# Patient Record
Sex: Male | Born: 1945 | Race: Black or African American | Hispanic: No | Marital: Married | Smoking: Former smoker
Health system: Southern US, Community
[De-identification: ages and names within clinical notes are randomized; demographics above are authoritative.]

## PROBLEM LIST (undated history)

## (undated) ENCOUNTER — Ambulatory Visit: Admission: EM | Source: Home / Self Care

## (undated) DIAGNOSIS — I779 Disorder of arteries and arterioles, unspecified: Secondary | ICD-10-CM

## (undated) DIAGNOSIS — I739 Peripheral vascular disease, unspecified: Secondary | ICD-10-CM

## (undated) DIAGNOSIS — Z8249 Family history of ischemic heart disease and other diseases of the circulatory system: Secondary | ICD-10-CM

## (undated) DIAGNOSIS — C801 Malignant (primary) neoplasm, unspecified: Secondary | ICD-10-CM

## (undated) DIAGNOSIS — E785 Hyperlipidemia, unspecified: Secondary | ICD-10-CM

## (undated) DIAGNOSIS — I82890 Acute embolism and thrombosis of other specified veins: Secondary | ICD-10-CM

## (undated) DIAGNOSIS — I10 Essential (primary) hypertension: Secondary | ICD-10-CM

## (undated) DIAGNOSIS — K219 Gastro-esophageal reflux disease without esophagitis: Secondary | ICD-10-CM

## (undated) DIAGNOSIS — Z9289 Personal history of other medical treatment: Secondary | ICD-10-CM

## (undated) HISTORY — DX: Peripheral vascular disease, unspecified: I73.9

## (undated) HISTORY — DX: Disorder of arteries and arterioles, unspecified: I77.9

## (undated) HISTORY — PX: OTHER SURGICAL HISTORY: SHX169

## (undated) HISTORY — DX: Essential (primary) hypertension: I10

## (undated) HISTORY — DX: Family history of ischemic heart disease and other diseases of the circulatory system: Z82.49

## (undated) HISTORY — PX: ANKLE SURGERY: SHX546

## (undated) HISTORY — DX: Hyperlipidemia, unspecified: E78.5

---

## 1997-03-03 DIAGNOSIS — I82890 Acute embolism and thrombosis of other specified veins: Secondary | ICD-10-CM

## 1997-03-03 HISTORY — DX: Acute embolism and thrombosis of other specified veins: I82.890

## 1997-08-02 ENCOUNTER — Ambulatory Visit (HOSPITAL_COMMUNITY): Admission: RE | Admit: 1997-08-02 | Discharge: 1997-08-02 | Payer: Self-pay | Admitting: Gastroenterology

## 1999-03-26 ENCOUNTER — Emergency Department (HOSPITAL_COMMUNITY): Admission: EM | Admit: 1999-03-26 | Discharge: 1999-03-26 | Payer: Self-pay | Admitting: Internal Medicine

## 2000-01-08 ENCOUNTER — Encounter: Payer: Self-pay | Admitting: Emergency Medicine

## 2000-01-08 ENCOUNTER — Emergency Department (HOSPITAL_COMMUNITY): Admission: EM | Admit: 2000-01-08 | Discharge: 2000-01-08 | Payer: Self-pay | Admitting: Emergency Medicine

## 2001-01-03 ENCOUNTER — Encounter: Payer: Self-pay | Admitting: Emergency Medicine

## 2001-01-03 ENCOUNTER — Emergency Department (HOSPITAL_COMMUNITY): Admission: EM | Admit: 2001-01-03 | Discharge: 2001-01-03 | Payer: Self-pay | Admitting: Emergency Medicine

## 2004-11-04 ENCOUNTER — Emergency Department (HOSPITAL_COMMUNITY): Admission: EM | Admit: 2004-11-04 | Discharge: 2004-11-04 | Payer: Self-pay | Admitting: Family Medicine

## 2004-11-25 ENCOUNTER — Encounter: Admission: RE | Admit: 2004-11-25 | Discharge: 2004-11-25 | Payer: Self-pay | Admitting: Family Medicine

## 2007-07-12 ENCOUNTER — Emergency Department (HOSPITAL_COMMUNITY): Admission: EM | Admit: 2007-07-12 | Discharge: 2007-07-12 | Payer: Self-pay | Admitting: Emergency Medicine

## 2007-07-20 HISTORY — PX: US ECHOCARDIOGRAPHY: HXRAD669

## 2007-07-21 ENCOUNTER — Ambulatory Visit: Payer: Self-pay | Admitting: Vascular Surgery

## 2007-07-26 ENCOUNTER — Emergency Department (HOSPITAL_COMMUNITY): Admission: EM | Admit: 2007-07-26 | Discharge: 2007-07-26 | Payer: Self-pay | Admitting: Family Medicine

## 2007-10-08 ENCOUNTER — Ambulatory Visit: Payer: Self-pay | Admitting: Vascular Surgery

## 2008-05-29 ENCOUNTER — Encounter: Admission: RE | Admit: 2008-05-29 | Discharge: 2008-05-29 | Payer: Self-pay | Admitting: Family Medicine

## 2008-05-30 ENCOUNTER — Emergency Department (HOSPITAL_COMMUNITY): Admission: EM | Admit: 2008-05-30 | Discharge: 2008-05-30 | Payer: Self-pay | Admitting: Emergency Medicine

## 2008-06-02 ENCOUNTER — Ambulatory Visit (HOSPITAL_BASED_OUTPATIENT_CLINIC_OR_DEPARTMENT_OTHER): Admission: RE | Admit: 2008-06-02 | Discharge: 2008-06-02 | Payer: Self-pay | Admitting: Internal Medicine

## 2008-06-02 ENCOUNTER — Ambulatory Visit: Payer: Self-pay | Admitting: Diagnostic Radiology

## 2008-06-27 ENCOUNTER — Encounter: Payer: Self-pay | Admitting: Internal Medicine

## 2008-07-04 ENCOUNTER — Inpatient Hospital Stay (HOSPITAL_COMMUNITY): Admission: EM | Admit: 2008-07-04 | Discharge: 2008-07-07 | Payer: Self-pay | Admitting: Emergency Medicine

## 2008-07-14 ENCOUNTER — Emergency Department (HOSPITAL_COMMUNITY): Admission: EM | Admit: 2008-07-14 | Discharge: 2008-07-14 | Payer: Self-pay | Admitting: Emergency Medicine

## 2008-07-20 ENCOUNTER — Emergency Department (HOSPITAL_COMMUNITY): Admission: EM | Admit: 2008-07-20 | Discharge: 2008-07-21 | Payer: Self-pay | Admitting: Emergency Medicine

## 2008-07-27 ENCOUNTER — Inpatient Hospital Stay (HOSPITAL_COMMUNITY): Admission: EM | Admit: 2008-07-27 | Discharge: 2008-07-28 | Payer: Self-pay | Admitting: Emergency Medicine

## 2009-05-10 ENCOUNTER — Inpatient Hospital Stay (HOSPITAL_COMMUNITY): Admission: EM | Admit: 2009-05-10 | Discharge: 2009-05-11 | Payer: Self-pay | Admitting: Emergency Medicine

## 2010-04-22 DIAGNOSIS — R252 Cramp and spasm: Secondary | ICD-10-CM | POA: Insufficient documentation

## 2010-04-22 DIAGNOSIS — J309 Allergic rhinitis, unspecified: Secondary | ICD-10-CM | POA: Insufficient documentation

## 2010-04-22 DIAGNOSIS — I2510 Atherosclerotic heart disease of native coronary artery without angina pectoris: Secondary | ICD-10-CM | POA: Insufficient documentation

## 2010-05-27 LAB — BASIC METABOLIC PANEL
BUN: 11 mg/dL (ref 6–23)
BUN: 14 mg/dL (ref 6–23)
CO2: 24 mEq/L (ref 19–32)
CO2: 29 mEq/L (ref 19–32)
Calcium: 8.5 mg/dL (ref 8.4–10.5)
Calcium: 8.8 mg/dL (ref 8.4–10.5)
Chloride: 106 mEq/L (ref 96–112)
Chloride: 111 mEq/L (ref 96–112)
Creatinine, Ser: 1 mg/dL (ref 0.4–1.5)
Creatinine, Ser: 1.16 mg/dL (ref 0.4–1.5)
GFR calc Af Amer: >60 mL/min (ref >60)
GFR calc Af Amer: >60 mL/min (ref >60)
GFR calc non Af Amer: >60 mL/min (ref >60)
GFR calc non Af Amer: >60 mL/min (ref >60)
Glucose, Bld: 100 mg/dL — ABNORMAL HIGH (ref 70–99)
Glucose, Bld: 102 mg/dL — ABNORMAL HIGH (ref 70–99)
Potassium: 3.8 mEq/L (ref 3.5–5.1)
Potassium: 3.8 mEq/L (ref 3.5–5.1)
Sodium: 138 mEq/L (ref 135–145)
Sodium: 140 mEq/L (ref 135–145)

## 2010-05-27 LAB — CROSSMATCH
ABO/RH(D): O POS
Antibody Screen: NEGATIVE

## 2010-05-27 LAB — CBC
HCT: 22.7 % — ABNORMAL LOW (ref 39.0–52.0)
HCT: 23.6 % — ABNORMAL LOW (ref 39.0–52.0)
HCT: 26.2 % — ABNORMAL LOW (ref 39.0–52.0)
Hemoglobin: 6.9 g/dL — CL (ref 13.0–17.0)
Hemoglobin: 7.3 g/dL — ABNORMAL LOW (ref 13.0–17.0)
Hemoglobin: 7.9 g/dL — ABNORMAL LOW (ref 13.0–17.0)
MCHC: 30 g/dL (ref 30.0–36.0)
MCHC: 30.4 g/dL (ref 30.0–36.0)
MCHC: 30.9 g/dL (ref 30.0–36.0)
MCV: 78.1 fL (ref 78.0–100.0)
MCV: 79.8 fL (ref 78.0–100.0)
MCV: 79.9 fL (ref 78.0–100.0)
Platelets: 260 10*3/uL (ref 150–400)
Platelets: 264 10*3/uL (ref 150–400)
Platelets: 306 10*3/uL (ref 150–400)
RBC: 2.9 MIL/uL — ABNORMAL LOW (ref 4.22–5.81)
RBC: 2.96 MIL/uL — ABNORMAL LOW (ref 4.22–5.81)
RBC: 3.29 MIL/uL — ABNORMAL LOW (ref 4.22–5.81)
RDW: 16.6 % — ABNORMAL HIGH (ref 11.5–15.5)
RDW: 17.2 % — ABNORMAL HIGH (ref 11.5–15.5)
RDW: 17.2 % — ABNORMAL HIGH (ref 11.5–15.5)
WBC: 4.4 10*3/uL (ref 4.0–10.5)
WBC: 4.9 10*3/uL (ref 4.0–10.5)
WBC: 5.3 10*3/uL (ref 4.0–10.5)

## 2010-05-27 LAB — PROTIME-INR
INR: 1 (ref 0.00–1.49)
Prothrombin Time: 13.1 seconds (ref 11.6–15.2)

## 2010-05-27 LAB — DIFFERENTIAL
Basophils Absolute: 0 10*3/uL (ref 0.0–0.1)
Basophils Relative: 1 % (ref 0–1)
Eosinophils Absolute: 0.1 10*3/uL (ref 0.0–0.7)
Eosinophils Relative: 3 % (ref 0–5)
Lymphocytes Relative: 36 % (ref 12–46)
Lymphs Abs: 1.8 10*3/uL (ref 0.7–4.0)
Monocytes Absolute: 0.5 10*3/uL (ref 0.1–1.0)
Monocytes Relative: 10 % (ref 3–12)
Neutro Abs: 2.5 10*3/uL (ref 1.7–7.7)
Neutrophils Relative %: 51 % (ref 43–77)

## 2010-05-27 LAB — IRON AND TIBC
Iron: <10 ug/dL — ABNORMAL LOW (ref 42–135)
UIBC: 300 ug/dL

## 2010-05-27 LAB — GLUCOSE, CAPILLARY: Glucose-Capillary: 97 mg/dL (ref 70–99)

## 2010-05-27 LAB — ABO/RH: ABO/RH(D): O POS

## 2010-05-27 LAB — APTT: aPTT: 44 seconds — ABNORMAL HIGH (ref 24–37)

## 2010-05-27 LAB — FERRITIN: Ferritin: 9 ng/mL — ABNORMAL LOW (ref 22–322)

## 2010-05-27 LAB — VITAMIN B12: Vitamin B-12: 494 pg/mL (ref 211–911)

## 2010-05-27 LAB — HEMOCCULT GUIAC POC 1CARD (OFFICE): Fecal Occult Bld: POSITIVE

## 2010-05-27 LAB — FOLATE: Folate: 11.5 ng/mL

## 2010-06-11 LAB — URINALYSIS, ROUTINE W REFLEX MICROSCOPIC
Bilirubin Urine: NEGATIVE
Bilirubin Urine: NEGATIVE
Bilirubin Urine: NEGATIVE
Glucose, UA: NEGATIVE mg/dL
Glucose, UA: NEGATIVE mg/dL
Glucose, UA: NEGATIVE mg/dL
Glucose, UA: NEGATIVE mg/dL
Hgb urine dipstick: NEGATIVE
Hgb urine dipstick: NEGATIVE
Hgb urine dipstick: NEGATIVE
Hgb urine dipstick: NEGATIVE
Ketones, ur: 15 mg/dL — AB
Ketones, ur: 40 mg/dL — AB
Ketones, ur: NEGATIVE mg/dL
Ketones, ur: NEGATIVE mg/dL
Nitrite: NEGATIVE
Nitrite: NEGATIVE
Nitrite: NEGATIVE
Nitrite: NEGATIVE
Protein, ur: NEGATIVE mg/dL
Protein, ur: NEGATIVE mg/dL
Protein, ur: NEGATIVE mg/dL
Protein, ur: NEGATIVE mg/dL
Specific Gravity, Urine: 1.023 (ref 1.005–1.030)
Specific Gravity, Urine: 1.025 (ref 1.005–1.030)
Specific Gravity, Urine: 1.011 (ref 1.005–1.030)
Specific Gravity, Urine: 1.013 (ref 1.005–1.030)
Urobilinogen, UA: 2 mg/dL — ABNORMAL HIGH (ref 0.0–1.0)
Urobilinogen, UA: 1 mg/dL (ref 0.0–1.0)
Urobilinogen, UA: 1 mg/dL (ref 0.0–1.0)
Urobilinogen, UA: 1 mg/dL (ref 0.0–1.0)
pH: 6 (ref 5.0–8.0)
pH: 6.5 (ref 5.0–8.0)
pH: 7 (ref 5.0–8.0)
pH: 7.5 (ref 5.0–8.0)

## 2010-06-11 LAB — BASIC METABOLIC PANEL
BUN: 8 mg/dL (ref 6–23)
BUN: 6 mg/dL (ref 6–23)
CO2: 26 mEq/L (ref 19–32)
CO2: 24 mEq/L (ref 19–32)
Calcium: 9 mg/dL (ref 8.4–10.5)
Calcium: 8.6 mg/dL (ref 8.4–10.5)
Chloride: 99 mEq/L (ref 96–112)
Chloride: 105 mEq/L (ref 96–112)
Creatinine, Ser: 0.75 mg/dL (ref 0.4–1.5)
Creatinine, Ser: 0.8 mg/dL (ref 0.4–1.5)
GFR calc Af Amer: >60 mL/min (ref >60)
GFR calc Af Amer: >60 mL/min (ref >60)
GFR calc non Af Amer: >60 mL/min (ref >60)
GFR calc non Af Amer: >60 mL/min (ref >60)
Glucose, Bld: 95 mg/dL (ref 70–99)
Glucose, Bld: 88 mg/dL (ref 70–99)
Potassium: 3.4 mEq/L — ABNORMAL LOW (ref 3.5–5.1)
Potassium: 3.9 mEq/L (ref 3.5–5.1)
Sodium: 130 mEq/L — ABNORMAL LOW (ref 135–145)
Sodium: 135 mEq/L (ref 135–145)

## 2010-06-11 LAB — DIFFERENTIAL
Basophils Absolute: 0.1 10*3/uL (ref 0.0–0.1)
Basophils Absolute: 0.1 10*3/uL (ref 0.0–0.1)
Basophils Absolute: 0 10*3/uL (ref 0.0–0.1)
Basophils Absolute: 0.1 10*3/uL (ref 0.0–0.1)
Basophils Absolute: 0.1 10*3/uL (ref 0.0–0.1)
Basophils Relative: 1 % (ref 0–1)
Basophils Relative: 1 % (ref 0–1)
Basophils Relative: 0 % (ref 0–1)
Basophils Relative: 1 % (ref 0–1)
Basophils Relative: 1 % (ref 0–1)
Eosinophils Absolute: 0.1 10*3/uL (ref 0.0–0.7)
Eosinophils Absolute: 0.2 10*3/uL (ref 0.0–0.7)
Eosinophils Absolute: 0 10*3/uL (ref 0.0–0.7)
Eosinophils Absolute: 0.1 10*3/uL (ref 0.0–0.7)
Eosinophils Absolute: 0.1 10*3/uL (ref 0.0–0.7)
Eosinophils Relative: 1 % (ref 0–5)
Eosinophils Relative: 3 % (ref 0–5)
Eosinophils Relative: 0 % (ref 0–5)
Eosinophils Relative: 1 % (ref 0–5)
Eosinophils Relative: 1 % (ref 0–5)
Lymphocytes Relative: 26 % (ref 12–46)
Lymphocytes Relative: 37 % (ref 12–46)
Lymphocytes Relative: 8 % — ABNORMAL LOW (ref 12–46)
Lymphocytes Relative: 22 % (ref 12–46)
Lymphocytes Relative: 23 % (ref 12–46)
Lymphs Abs: 2.5 10*3/uL (ref 0.7–4.0)
Lymphs Abs: 2.9 10*3/uL (ref 0.7–4.0)
Lymphs Abs: 1.4 10*3/uL (ref 0.7–4.0)
Lymphs Abs: 1.8 10*3/uL (ref 0.7–4.0)
Lymphs Abs: 2.2 10*3/uL (ref 0.7–4.0)
Monocytes Absolute: 0.7 10*3/uL (ref 0.1–1.0)
Monocytes Absolute: 1 10*3/uL (ref 0.1–1.0)
Monocytes Absolute: 0.6 10*3/uL (ref 0.1–1.0)
Monocytes Absolute: 0.6 10*3/uL (ref 0.1–1.0)
Monocytes Absolute: 0.6 10*3/uL (ref 0.1–1.0)
Monocytes Relative: 10 % (ref 3–12)
Monocytes Relative: 9 % (ref 3–12)
Monocytes Relative: 4 % (ref 3–12)
Monocytes Relative: 6 % (ref 3–12)
Monocytes Relative: 7 % (ref 3–12)
Neutro Abs: 3.3 10*3/uL (ref 1.7–7.7)
Neutro Abs: 7.3 10*3/uL (ref 1.7–7.7)
Neutro Abs: 14.3 10*3/uL — ABNORMAL HIGH (ref 1.7–7.7)
Neutro Abs: 5.3 10*3/uL (ref 1.7–7.7)
Neutro Abs: 7 10*3/uL (ref 1.7–7.7)
Neutrophils Relative %: 49 % (ref 43–77)
Neutrophils Relative %: 64 % (ref 43–77)
Neutrophils Relative %: 88 % — ABNORMAL HIGH (ref 43–77)
Neutrophils Relative %: 67 % (ref 43–77)
Neutrophils Relative %: 70 % (ref 43–77)

## 2010-06-11 LAB — CBC
HCT: 29.8 % — ABNORMAL LOW (ref 39.0–52.0)
HCT: 39.6 % (ref 39.0–52.0)
HCT: 39.1 % (ref 39.0–52.0)
HCT: 31.1 % — ABNORMAL LOW (ref 39.0–52.0)
HCT: 34.6 % — ABNORMAL LOW (ref 39.0–52.0)
HCT: 36.8 % — ABNORMAL LOW (ref 39.0–52.0)
Hemoglobin: 10.2 g/dL — ABNORMAL LOW (ref 13.0–17.0)
Hemoglobin: 13.2 g/dL (ref 13.0–17.0)
Hemoglobin: 13.1 g/dL (ref 13.0–17.0)
Hemoglobin: 10.5 g/dL — ABNORMAL LOW (ref 13.0–17.0)
Hemoglobin: 11.5 g/dL — ABNORMAL LOW (ref 13.0–17.0)
Hemoglobin: 12.9 g/dL — ABNORMAL LOW (ref 13.0–17.0)
MCHC: 33.4 g/dL (ref 30.0–36.0)
MCHC: 34.1 g/dL (ref 30.0–36.0)
MCHC: 33.5 g/dL (ref 30.0–36.0)
MCHC: 33.2 g/dL (ref 30.0–36.0)
MCHC: 34 g/dL (ref 30.0–36.0)
MCHC: 35 g/dL (ref 30.0–36.0)
MCV: 86.8 fL (ref 78.0–100.0)
MCV: 87 fL (ref 78.0–100.0)
MCV: 86.5 fL (ref 78.0–100.0)
MCV: 86.1 fL (ref 78.0–100.0)
MCV: 86.8 fL (ref 78.0–100.0)
MCV: 87.3 fL (ref 78.0–100.0)
Platelets: 246 10*3/uL (ref 150–400)
Platelets: 326 10*3/uL (ref 150–400)
Platelets: 298 10*3/uL (ref 150–400)
Platelets: 211 10*3/uL (ref 150–400)
Platelets: 234 10*3/uL (ref 150–400)
Platelets: 248 10*3/uL (ref 150–400)
RBC: 3.43 MIL/uL — ABNORMAL LOW (ref 4.22–5.81)
RBC: 4.56 MIL/uL (ref 4.22–5.81)
RBC: 4.52 MIL/uL (ref 4.22–5.81)
RBC: 3.58 MIL/uL — ABNORMAL LOW (ref 4.22–5.81)
RBC: 3.97 MIL/uL — ABNORMAL LOW (ref 4.22–5.81)
RBC: 4.28 MIL/uL (ref 4.22–5.81)
RDW: 12.8 % (ref 11.5–15.5)
RDW: 12.9 % (ref 11.5–15.5)
RDW: 12.8 % (ref 11.5–15.5)
RDW: 11.4 % — ABNORMAL LOW (ref 11.5–15.5)
RDW: 12.6 % (ref 11.5–15.5)
RDW: 13 % (ref 11.5–15.5)
WBC: 11.4 10*3/uL — ABNORMAL HIGH (ref 4.0–10.5)
WBC: 6.8 10*3/uL (ref 4.0–10.5)
WBC: 16.3 10*3/uL — ABNORMAL HIGH (ref 4.0–10.5)
WBC: 7.9 10*3/uL (ref 4.0–10.5)
WBC: 7.9 10*3/uL (ref 4.0–10.5)
WBC: 9.9 10*3/uL (ref 4.0–10.5)

## 2010-06-11 LAB — COMPREHENSIVE METABOLIC PANEL
ALT: 15 U/L (ref 0–53)
ALT: 21 U/L (ref 0–53)
ALT: 14 U/L (ref 0–53)
ALT: 16 U/L (ref 0–53)
ALT: 25 U/L (ref 0–53)
AST: 24 U/L (ref 0–37)
AST: 25 U/L (ref 0–37)
AST: 19 U/L (ref 0–37)
AST: 26 U/L (ref 0–37)
AST: 27 U/L (ref 0–37)
Albumin: 3.8 g/dL (ref 3.5–5.2)
Albumin: 3.9 g/dL (ref 3.5–5.2)
Albumin: 3 g/dL — ABNORMAL LOW (ref 3.5–5.2)
Albumin: 3.5 g/dL (ref 3.5–5.2)
Albumin: 3.6 g/dL (ref 3.5–5.2)
Alkaline Phosphatase: 71 U/L (ref 39–117)
Alkaline Phosphatase: 81 U/L (ref 39–117)
Alkaline Phosphatase: 58 U/L (ref 39–117)
Alkaline Phosphatase: 66 U/L (ref 39–117)
Alkaline Phosphatase: 73 U/L (ref 39–117)
BUN: 8 mg/dL (ref 6–23)
BUN: 15 mg/dL (ref 6–23)
BUN: 10 mg/dL (ref 6–23)
BUN: 10 mg/dL (ref 6–23)
BUN: 8 mg/dL (ref 6–23)
CO2: 27 mEq/L (ref 19–32)
CO2: 30 mEq/L (ref 19–32)
CO2: 24 mEq/L (ref 19–32)
CO2: 25 mEq/L (ref 19–32)
CO2: 27 mEq/L (ref 19–32)
Calcium: 9.7 mg/dL (ref 8.4–10.5)
Calcium: 9.8 mg/dL (ref 8.4–10.5)
Calcium: 8.7 mg/dL (ref 8.4–10.5)
Calcium: 8.7 mg/dL (ref 8.4–10.5)
Calcium: 9.1 mg/dL (ref 8.4–10.5)
Chloride: 92 mEq/L — ABNORMAL LOW (ref 96–112)
Chloride: 90 mEq/L — ABNORMAL LOW (ref 96–112)
Chloride: 105 mEq/L (ref 96–112)
Chloride: 98 mEq/L (ref 96–112)
Chloride: 98 mEq/L (ref 96–112)
Creatinine, Ser: 0.93 mg/dL (ref 0.4–1.5)
Creatinine, Ser: 0.89 mg/dL (ref 0.4–1.5)
Creatinine, Ser: 0.82 mg/dL (ref 0.4–1.5)
Creatinine, Ser: 0.84 mg/dL (ref 0.4–1.5)
Creatinine, Ser: 0.94 mg/dL (ref 0.4–1.5)
GFR calc Af Amer: >60 mL/min (ref >60)
GFR calc Af Amer: >60 mL/min (ref >60)
GFR calc Af Amer: >60 mL/min (ref >60)
GFR calc Af Amer: >60 mL/min (ref >60)
GFR calc Af Amer: >60 mL/min (ref >60)
GFR calc non Af Amer: >60 mL/min (ref >60)
GFR calc non Af Amer: >60 mL/min (ref >60)
GFR calc non Af Amer: >60 mL/min (ref >60)
GFR calc non Af Amer: >60 mL/min (ref >60)
GFR calc non Af Amer: >60 mL/min (ref >60)
Glucose, Bld: 109 mg/dL — ABNORMAL HIGH (ref 70–99)
Glucose, Bld: 113 mg/dL — ABNORMAL HIGH (ref 70–99)
Glucose, Bld: 102 mg/dL — ABNORMAL HIGH (ref 70–99)
Glucose, Bld: 112 mg/dL — ABNORMAL HIGH (ref 70–99)
Glucose, Bld: 99 mg/dL (ref 70–99)
Potassium: 3.8 mEq/L (ref 3.5–5.1)
Potassium: 4.1 mEq/L (ref 3.5–5.1)
Potassium: 3.8 mEq/L (ref 3.5–5.1)
Potassium: 3.8 mEq/L (ref 3.5–5.1)
Potassium: 3.9 mEq/L (ref 3.5–5.1)
Sodium: 129 mEq/L — ABNORMAL LOW (ref 135–145)
Sodium: 127 mEq/L — ABNORMAL LOW (ref 135–145)
Sodium: 129 mEq/L — ABNORMAL LOW (ref 135–145)
Sodium: 130 mEq/L — ABNORMAL LOW (ref 135–145)
Sodium: 134 mEq/L — ABNORMAL LOW (ref 135–145)
Total Bilirubin: 0.8 mg/dL (ref 0.3–1.2)
Total Bilirubin: 0.9 mg/dL (ref 0.3–1.2)
Total Bilirubin: 0.6 mg/dL (ref 0.3–1.2)
Total Bilirubin: 0.6 mg/dL (ref 0.3–1.2)
Total Bilirubin: 0.7 mg/dL (ref 0.3–1.2)
Total Protein: 7.3 g/dL (ref 6.0–8.3)
Total Protein: 7.2 g/dL (ref 6.0–8.3)
Total Protein: 5.1 g/dL — ABNORMAL LOW (ref 6.0–8.3)
Total Protein: 6.1 g/dL (ref 6.0–8.3)
Total Protein: 6.2 g/dL (ref 6.0–8.3)

## 2010-06-11 LAB — LACTIC ACID, PLASMA
Lactic Acid, Venous: 2 mmol/L (ref 0.5–2.2)
Lactic Acid, Venous: 1.8 mmol/L (ref 0.5–2.2)

## 2010-06-11 LAB — LIPASE, BLOOD
Lipase: 27 U/L (ref 11–59)
Lipase: 20 U/L (ref 11–59)
Lipase: 24 U/L (ref 11–59)
Lipase: 29 U/L (ref 11–59)

## 2010-06-11 LAB — URINE CULTURE
Colony Count: NO GROWTH
Colony Count: 1000
Culture: NO GROWTH

## 2010-06-11 LAB — POCT I-STAT, CHEM 8
BUN: 11 mg/dL (ref 6–23)
Calcium, Ion: 1.11 mmol/L — ABNORMAL LOW (ref 1.12–1.32)
Chloride: 95 mEq/L — ABNORMAL LOW (ref 96–112)
Creatinine, Ser: 1.1 mg/dL (ref 0.4–1.5)
Glucose, Bld: 99 mg/dL (ref 70–99)
HCT: 37 % — ABNORMAL LOW (ref 39.0–52.0)
Hemoglobin: 12.6 g/dL — ABNORMAL LOW (ref 13.0–17.0)
Potassium: 3.7 mEq/L (ref 3.5–5.1)
Sodium: 129 mEq/L — ABNORMAL LOW (ref 135–145)
TCO2: 24 mmol/L (ref 0–100)

## 2010-06-11 LAB — GLUCOSE, CAPILLARY: Glucose-Capillary: 214 mg/dL — ABNORMAL HIGH (ref 70–99)

## 2010-06-11 LAB — ETHANOL: Alcohol, Ethyl (B): <5 mg/dL (ref 0–10)

## 2010-06-13 LAB — URINE MICROSCOPIC-ADD ON

## 2010-06-13 LAB — COMPREHENSIVE METABOLIC PANEL
ALT: 15 U/L (ref 0–53)
AST: 24 U/L (ref 0–37)
Albumin: 3.9 g/dL (ref 3.5–5.2)
Alkaline Phosphatase: 92 U/L (ref 39–117)
BUN: 14 mg/dL (ref 6–23)
CO2: 28 mEq/L (ref 19–32)
Calcium: 9.6 mg/dL (ref 8.4–10.5)
Chloride: 98 mEq/L (ref 96–112)
Creatinine, Ser: 0.84 mg/dL (ref 0.4–1.5)
GFR calc Af Amer: >60 mL/min (ref >60)
GFR calc non Af Amer: >60 mL/min (ref >60)
Glucose, Bld: 99 mg/dL (ref 70–99)
Potassium: 4.1 mEq/L (ref 3.5–5.1)
Sodium: 134 mEq/L — ABNORMAL LOW (ref 135–145)
Total Bilirubin: 0.7 mg/dL (ref 0.3–1.2)
Total Protein: 6.9 g/dL (ref 6.0–8.3)

## 2010-06-13 LAB — DIFFERENTIAL
Basophils Absolute: 0.1 10*3/uL (ref 0.0–0.1)
Basophils Relative: 1 % (ref 0–1)
Eosinophils Absolute: 0.2 10*3/uL (ref 0.0–0.7)
Eosinophils Relative: 3 % (ref 0–5)
Lymphocytes Relative: 38 % (ref 12–46)
Lymphs Abs: 2.8 10*3/uL (ref 0.7–4.0)
Monocytes Absolute: 0.5 10*3/uL (ref 0.1–1.0)
Monocytes Relative: 7 % (ref 3–12)
Neutro Abs: 3.9 10*3/uL (ref 1.7–7.7)
Neutrophils Relative %: 52 % (ref 43–77)

## 2010-06-13 LAB — URINALYSIS, ROUTINE W REFLEX MICROSCOPIC
Bilirubin Urine: NEGATIVE
Glucose, UA: NEGATIVE mg/dL
Hgb urine dipstick: NEGATIVE
Ketones, ur: NEGATIVE mg/dL
Leukocytes, UA: NEGATIVE
Nitrite: NEGATIVE
Protein, ur: NEGATIVE mg/dL
Specific Gravity, Urine: 1.017 (ref 1.005–1.030)
Urobilinogen, UA: 0.2 mg/dL (ref 0.0–1.0)
pH: 7.5 (ref 5.0–8.0)

## 2010-06-13 LAB — CBC
HCT: 39.4 % (ref 39.0–52.0)
Hemoglobin: 13 g/dL (ref 13.0–17.0)
MCHC: 33 g/dL (ref 30.0–36.0)
MCV: 88.3 fL (ref 78.0–100.0)
Platelets: 211 10*3/uL (ref 150–400)
RBC: 4.46 MIL/uL (ref 4.22–5.81)
RDW: 12.9 % (ref 11.5–15.5)
WBC: 7.5 10*3/uL (ref 4.0–10.5)

## 2010-06-13 LAB — LIPASE, BLOOD: Lipase: 26 U/L (ref 11–59)

## 2010-07-16 NOTE — Procedures (Signed)
LOWER EXTREMITY ARTERIAL EVALUATION-SINGLE LEVEL   INDICATION:  Decreased pedal pulses.  Patient complains of bilateral  claudication, right > left, at two blocks.  Patient denies rest pain.   HISTORY:  Diabetes:  No.  Cardiac:  No.  Hypertension:  Yes.  Smoking:  Quit one year ago.  Previous Surgery:  No.   RESTING SYSTOLIC PRESSURES: (ABI)                          RIGHT                LEFT  Brachial:               146                  152  Anterior tibial:        100                  110  Posterior tibial:       Barely audible       Inaudible  Peroneal:               110 (0.72)           110 (0.72)  DOPPLER WAVEFORM ANALYSIS:  Anterior tibial:        Biphasic             Biphasic  Posterior tibial:  Peroneal:               Biphasic             Biphasic   PREVIOUS ABI'S:  Date:  RIGHT:  LEFT:   IMPRESSION:  Bilateral ankle brachial indices consistent with mild-  moderate arterial compromise.   A preliminary report was faxed to Dr. Silva Bandy office at 3:45 on  07/21/07.   ___________________________________________  Larina Earthly, M.D.   PB/MEDQ  D:  07/21/2007  T:  07/21/2007  Job:  [id number]

## 2010-07-16 NOTE — Discharge Summary (Signed)
Shawn Fernandez, Shawn Fernandez              ACCOUNT NO.:  [id number]   MEDICAL RECORD NO.:  [id number]          PATIENT TYPE:  INP   LOCATION:  1509                         FACILITY:  [facility]   PHYSICIAN:  Jackie Plum, M.D.DATE OF BIRTH:  [birth year]   DATE OF ADMISSION:  07/04/2008  DATE OF DISCHARGE:                               DISCHARGE SUMMARY   DISCHARGE DIAGNOSES:  1. Nausea, vomiting that could not be resolved, etiology unclear but      could be stress related versus reflux.  2. History of hypertension.  3. Gastroesophageal reflux disease.  4. Colonic polyp.  5. Hyperlipidemia.   [name] was admitted to the hospital on Jul 04, 2008, after  presenting with acute onset of abdominal pain, nausea and vomiting.  Nausea and vomiting were persistent despite medications including  Phenergan.  He came to the ED for further evaluation.  In the emergency  room, the patient was medicated but continued to have GI symptoms.  His  lab work revealed hyponatremia.  He was therefore admitted for further  evaluation and management of his persistent abdominal pain with nausea  and vomiting which had failed outpatient treatment, but no nonspecific  cause/etiology noted on outpatient workup.  On admission, the patient  was started on a saline IV infusion on account of his hyponatremia which  was felt to be GI loss related.  Pain medications were offered as long  as well as antiemetics and antinauseants.  He was made n.p.o. and  subsequently put on clear liquids overnight.  The patient's pain  improved and naturally resolved.  After 24 hours of admission, Epstein  test was done which came back negative.  Follow up ultrasound of the  abdomen was done which also did not show a specific etiology.  The  patient has been pain-free since trials of advancement of his diet from  yesterday morning.  He has been able to tolerate his diet without any  problems or pain.  He has been pain-free over the last  24 hours.  He is  therefore being discharged home today for outpatient follow up.   He is going to continue his home medications which are:  1. Aspirin 81 mg daily.  2. Donnatal 50 mg q.i.d.  3. Hydrochlorothiazide 25 mg daily.  4. Prinivil 20 mg daily.  5. Megace 40 mg b.i.d.  6. Reglan 5 mg 4 times daily.  7. Multivitamin 1 capsule daily.  8. Protonix 40 mg q. 12 h.  9. Phenergan 25 mg q. 6. p.r.n.  10.Zocor 40 mg q.h.s.  11.Vicodin 1 tablet q. 4. p.r.n.   The patient is discharged in stable and satisfactory condition  __________admission, hemodynamically stable and he will be following up  with me in the office August 02, 2008.  He is to call and schedule an  appointment to see Dr. Elnoria Howard in the next week or 2 in the office for  further evaluation.      Jackie Plum, M.D.  Electronically Signed     GO/MEDQ  D:  07/07/2008  T:  07/07/2008  Job:  [id number]   cc:  Jordan Hawks Elnoria Howard, MD  Fax: [phone]

## 2010-07-16 NOTE — Procedures (Signed)
CAROTID DUPLEX EXAM   INDICATION:  Left carotid bruit.   HISTORY:  Diabetes:  No.  Cardiac:  No.  Hypertension:  Yes.  Smoking:  Quit about a year ago.  Previous Surgery:  No.  CV History:  No.  Amaurosis Fugax No, Paresthesias No, Hemiparesis No                                       RIGHT             LEFT  Brachial systolic pressure:         140  Brachial Doppler waveforms:         Biphasic  Vertebral direction of flow:        Antegrade         Not well  visualized  DUPLEX VELOCITIES (cm/sec)  CCA peak systolic                   111               96  ECA peak systolic                   132               35  ICA peak systolic                   138               70  ICA end diastolic                   36                30  PLAQUE MORPHOLOGY:                  Heterogenous      Heterogenous  PLAQUE AMOUNT:                      Moderate          Mild  PLAQUE LOCATION:                    ICA and ECA       ICA   IMPRESSION:  1. 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  2. 1-39% stenosis noted in the left ICA.  3. Antegrade right vertebral artery.   ___________________________________________  Di Kindle. Edilia Bo, M.D.   MG/MEDQ  D:  10/08/2007  T:  10/08/2007  Job:  [id number]

## 2010-07-16 NOTE — Consult Note (Signed)
Shawn Fernandez, Shawn Fernandez              ACCOUNT NO.:  [id number]   MEDICAL RECORD NO.:  [id number]          PATIENT TYPE:  INP   LOCATION:  1509                         FACILITY:  [facility]   PHYSICIAN:  Jordan Hawks. Elnoria Howard, MD    DATE OF BIRTH:  08/27/1945   DATE OF CONSULTATION:  07/05/2008  DATE OF DISCHARGE:                                 CONSULTATION   REASON FOR CONSULTATION:  Nausea and vomiting.   HISTORY OF PRESENT ILLNESS:  This is a 65 year old gentleman with past  medical history of hyperlipidemia, hypertension, emphysema,  gastroesophageal reflux disease and colonic polyp who was admitted to  the hospital with complaints of nausea and vomiting.  The patient  apparently had an acute onset of the nausea and vomiting starting about  1:00 yesterday afternoon.  He vomited just a slight amount but felt  nauseous, and in the evening when he ate chicken noodle soup, he  subsequently had further nausea and vomiting, and it was persistent  which brought him into the emergency room.  Subsequently, he was  admitted to the hospital for further evaluation and treatment.  The  patient was recently evaluated by Dr. Elnoria Howard in the office on June 14, 2008, for complaints of abdominal pain.  At that time, the patient  denied having any gastroesophageal reflux disease.  However, his wife  stated that this was a significant issue for him.  Subsequently, the  patient underwent an EGD and was noted to have a 3 cm hiatal hernia and  a small colonic polyp, but, otherwise, no other overt abnormality.  At  that time, the patient did state feeling better without any specific  intervention, and his weight was also increasing.  At that time, it was  felt that he can maintain his PPI.  From the evaluation of the EGD and  colonoscopy __________ the nausea and vomiting that the patient did not  have any significant symptoms, although he had still some mild  persistent periumbilical pain.   PAST MEDICAL HISTORY AND  PAST SURGICAL HISTORY:  As stated above.   FAMILY HISTORY:  Noncontributory.   SOCIAL HISTORY:  Negative for alcohol, tobacco or illicit drug use.   MEDICATIONS:  1. Aspirin 81 mg p.o. daily.  2. Donnatal 50 mg p.o. q.i.d.  3. Hydrochlorothiazide 25 mg p.o. daily.  4. Prinivil 20 mg p.o. daily.  5. Megace 40 mg p.o. b.i.d.  6. Reglan 5 mg p.o. q.a.c. and at bedtime.  7. Multivitamin 1 p.o. daily.  8. Protonix 40 mg IV q.12 h.  9. Phenergan 10 mL p.o. q.6 h. p.r.n.  10.Zocor 40 mg p.o. at bedtime.  11.Vicodin 1 tablet p.o. q.4 h. p.r.n.  12.Zofran 4 mg IV q.6 h. p.r.n.  13.Carafate 0.5 grams p.o. q.6 h. p.r.n.   ALLERGIES:  NO KNOWN DRUG ALLERGIES.   PHYSICAL EXAMINATION:  VITAL SIGNS:  Blood pressure is 112/67, heart  rate 66, respirations 20, temperature 98.5.  GENERALLY:  The patient is in no acute distress, alert and oriented.  HEENT:  Normocephalic, atraumatic.  Extraocular muscles intact.  NECK:  Supple.  No  lymphadenopathy.  LUNGS:  Clear to auscultation bilaterally.  CARDIOVASCULAR:  Regular rate and rhythm.  ABDOMEN:  Flat, soft, nondistended.  Positive bowel sounds.  Mild  tenderness in the periumbilical region.  EXTREMITIES:  No clubbing, cyanosis or edema.   LABORATORY VALUES:  White blood cell count 7.9, hemoglobin 10.5,  platelet count at 211.  Sodium 134, potassium 3.9, chloride 105, CO2 of  25, glucose 99, BUN 8, creatinine 0.9, total bilirubin 0.7, alk phos 58,  ALT 14, AST 19, albumin 3.   IMPRESSION:  1. Nausea and vomiting.  2. Periumbilical pain.  3. Gastroesophageal reflux disease.  4. A 3 cm hiatal hernia.  5. At this time, I am unclear why the patient has acute exacerbation      of his nausea and vomiting.  He apparently was doing well up until      this point.  Per his report, he denies having any reflux disease,      but this is contrary to the observations of his wife.  Currently, I      do agree with institution of Carafate and also Protonix.   The      patient is undergoing a gastric emptying scan.  This will help to      further identify if he has any issues in regard to gastroparesis.      Currently, the patient denies having any nausea and vomiting.   PLAN:  1. Continue with Protonix and Carafate.  2. Await gastric emptying scan results.  3. Continue with supportive care.      Jordan Hawks Elnoria Howard, MD  Electronically Signed     PDH/MEDQ  D:  07/05/2008  T:  07/05/2008  Job:  [id number]

## 2011-07-11 ENCOUNTER — Encounter (HOSPITAL_COMMUNITY): Payer: Self-pay | Admitting: Pharmacy Technician

## 2011-07-18 ENCOUNTER — Ambulatory Visit
Admission: RE | Admit: 2011-07-18 | Discharge: 2011-07-18 | Disposition: A | Discharge status: Home / Self Care | Payer: Medicare Other | Source: Ambulatory Visit | Attending: Cardiovascular Disease | Admitting: Cardiovascular Disease

## 2011-07-18 ENCOUNTER — Other Ambulatory Visit: Payer: Self-pay | Admitting: Cardiovascular Disease

## 2011-07-18 DIAGNOSIS — I270 Primary pulmonary hypertension: Secondary | ICD-10-CM

## 2011-07-18 DIAGNOSIS — Z01811 Encounter for preprocedural respiratory examination: Secondary | ICD-10-CM

## 2011-07-22 ENCOUNTER — Other Ambulatory Visit: Payer: Self-pay | Admitting: Cardiovascular Disease

## 2011-07-23 ENCOUNTER — Other Ambulatory Visit: Payer: Self-pay | Admitting: Cardiovascular Disease

## 2011-07-24 ENCOUNTER — Ambulatory Visit (HOSPITAL_COMMUNITY)
Admission: RE | Admit: 2011-07-24 | Discharge: 2011-07-24 | Disposition: A | Discharge status: Home / Self Care | Payer: Medicare Other | Source: Ambulatory Visit | Attending: Cardiovascular Disease | Admitting: Cardiovascular Disease

## 2011-07-24 ENCOUNTER — Encounter (HOSPITAL_COMMUNITY)
Admission: RE | Disposition: A | Discharge status: Home / Self Care | Payer: Self-pay | Source: Ambulatory Visit | Attending: Cardiovascular Disease

## 2011-07-24 ENCOUNTER — Encounter: Payer: Self-pay | Admitting: *Deleted

## 2011-07-24 DIAGNOSIS — F17200 Nicotine dependence, unspecified, uncomplicated: Secondary | ICD-10-CM | POA: Insufficient documentation

## 2011-07-24 DIAGNOSIS — E785 Hyperlipidemia, unspecified: Secondary | ICD-10-CM | POA: Insufficient documentation

## 2011-07-24 DIAGNOSIS — I70219 Atherosclerosis of native arteries of extremities with intermittent claudication, unspecified extremity: Secondary | ICD-10-CM | POA: Insufficient documentation

## 2011-07-24 DIAGNOSIS — I10 Essential (primary) hypertension: Secondary | ICD-10-CM | POA: Insufficient documentation

## 2011-07-24 HISTORY — PX: LOWER EXTREMITY ANGIOGRAM: SHX5508

## 2011-07-24 SURGERY — ANGIOGRAM, LOWER EXTREMITY
Anesthesia: LOCAL

## 2011-07-24 MED ORDER — SODIUM CHLORIDE 0.9 % IJ SOLN: 3.0000 mL | INTRAMUSCULAR | Status: DC | PRN

## 2011-07-24 MED ORDER — DIAZEPAM 5 MG PO TABS
5.0000 mg | ORAL_TABLET | ORAL | Status: AC
  Administered 2011-07-24: 5 mg via ORAL
  Filled 2011-07-24: qty 1

## 2011-07-24 MED ORDER — LIDOCAINE HCL (PF) 1 % IJ SOLN
INTRAMUSCULAR | Status: AC
  Filled 2011-07-24: qty 30

## 2011-07-24 MED ORDER — ACETAMINOPHEN 325 MG PO TABS: 650.0000 mg | ORAL_TABLET | ORAL | Status: DC | PRN

## 2011-07-24 MED ORDER — SODIUM CHLORIDE 0.9 % IV SOLN
INTRAVENOUS | Status: DC
  Administered 2011-07-24: 1000 mL via INTRAVENOUS

## 2011-07-24 MED ORDER — ONDANSETRON HCL 4 MG/2ML IJ SOLN: 4.0000 mg | Freq: Four times a day (QID) | INTRAMUSCULAR | Status: DC | PRN

## 2011-07-24 MED ORDER — MORPHINE SULFATE 4 MG/ML IJ SOLN: 1.0000 mg | INTRAMUSCULAR | Status: DC | PRN

## 2011-07-24 MED ORDER — HEPARIN (PORCINE) IN NACL 2-0.9 UNIT/ML-% IJ SOLN
INTRAMUSCULAR | Status: AC
  Filled 2011-07-24: qty 1000

## 2011-07-24 MED ORDER — SODIUM CHLORIDE 0.9 % IV SOLN: INTRAVENOUS | Status: AC

## 2011-07-24 NOTE — Discharge Instructions (Signed)

## 2011-07-24 NOTE — H&P (Signed)
H & P will be scanned in.  Pt was reexamined and existing H & P reviewed. No changes found.  Runell Gess, MD [facility] 07/24/2011 10:54 AM

## 2011-07-24 NOTE — Progress Notes (Signed)
Pt walked in hallway without difficulty. Left groin dressing remains CDI.

## 2011-07-24 NOTE — Op Note (Signed)
Shawn Fernandez is a 66 y.o. male    [id number] LOCATION:  FACILITY: [facility]  PHYSICIAN: Nanetta Batty, M.D. [birth year]   DATE OF PROCEDURE:  07/24/2011  DATE OF DISCHARGE:  [facility] PV Angiogram    History obtained from chart review. Shawn Fernandez is a 66 year old thin appearing married African American male referred to me by Dr. Andi Devon because of claudication. His affect is include tobacco abuse, hypertension and hyperlipidemia. He does have a family history heart disease with a brother who died during bypass surgery at age 55. He also has mesenteric ischemia and has had stenting of his [facility] in the past. He complains of right greater than left lower extremity claudication which is gotten progressively worse now less than one block. Doppler studies in our office performed on April 23 revealed ABIs in the 0.7 range bilaterally with a high-frequency signal in the distal right common femoral artery. He presents now for angiography and potential intervention for lifestyle limiting claudication.   PROCEDURE DESCRIPTION:    The patient was brought to the second floor  Shawn Fernandez cath lab in the postabsorptive state. He was  premedicated with Valium 5 mg by mouth. His groin was prepped and shaved in usual sterile fashion. Xylocaine 1% was used  for local anesthesia. A 5 French sheath was inserted into the left common femoral  artery using standard Seldinger technique. A 5 French pigtail catheter was used for abdominal aortography in the PA and lateral views with bifemoral runoff using bolus chase digital subtraction step table technique. Visipaque allergies for the entirety of the case. Retrograde aortic pressure was monitored during the case. A 5 French crossover catheter and angled catheter was used for selective right femoral angiography.  HEMODYNAMICS:    AO SYSTOLIC/AO DIASTOLIC: 153/64    ANGIOGRAPHIC RESULTS:   1:  Abdominal aortogram-patent SMA stents with 50% quick in-stent restenosis. The renal artery is widely patent. The infrarenal abdominal aorta was free of significant atherosclerotic disease.  2: Left lower extremity-moderate diffuse disease of his left SFA in the 50% range with one vessel runoff via the peroneal. The pedal itself had a 50-60% segmental proximal stenosis.  3: Right lower extremity-50-70% right common femoral artery stenosis. There was a 90% ostial right profunda artery stenosis. There is diffuse 50-60% stenosis throughout the entirety of the right SFA which vessel runoff. The posterior tibial is occluded.   IMPRESSION:diffuse SFA disease bilaterally with left greater than right tibial vessel disease. His SMA stents are open. His right common femoral artery stenosis while 60-70% narrowed angiographically only had a 10 mm pullback gradient. At this point continue medical therapy will be recommended. The sheath was removed and pressure was held on the groin to achieve hemostasis. The patient left stable condition. Gently hydrated and discharged him today as outpatient pedal sleep apnea office in 2-3 for followup.  Runell Gess MD, [facility] 07/24/2011 11:27 AM

## 2011-10-20 ENCOUNTER — Other Ambulatory Visit: Payer: Self-pay | Admitting: Nurse Practitioner

## 2011-10-20 ENCOUNTER — Ambulatory Visit
Admission: RE | Admit: 2011-10-20 | Discharge: 2011-10-20 | Disposition: A | Discharge status: Home / Self Care | Payer: Medicare Other | Source: Ambulatory Visit | Attending: Nurse Practitioner | Admitting: Nurse Practitioner

## 2011-10-20 DIAGNOSIS — R059 Cough, unspecified: Secondary | ICD-10-CM

## 2011-10-20 DIAGNOSIS — R05 Cough: Secondary | ICD-10-CM

## 2011-10-25 ENCOUNTER — Encounter (HOSPITAL_COMMUNITY): Payer: Self-pay | Admitting: *Deleted

## 2011-10-25 ENCOUNTER — Emergency Department (HOSPITAL_COMMUNITY)
Admission: EM | Admit: 2011-10-25 | Discharge: 2011-10-26 | Disposition: A | Discharge status: Home / Self Care | Payer: Medicare Other | Attending: Emergency Medicine | Admitting: Emergency Medicine

## 2011-10-25 DIAGNOSIS — I10 Essential (primary) hypertension: Secondary | ICD-10-CM | POA: Insufficient documentation

## 2011-10-25 DIAGNOSIS — E785 Hyperlipidemia, unspecified: Secondary | ICD-10-CM | POA: Insufficient documentation

## 2011-10-25 DIAGNOSIS — R05 Cough: Secondary | ICD-10-CM | POA: Insufficient documentation

## 2011-10-25 DIAGNOSIS — R1110 Vomiting, unspecified: Secondary | ICD-10-CM

## 2011-10-25 DIAGNOSIS — R059 Cough, unspecified: Secondary | ICD-10-CM | POA: Insufficient documentation

## 2011-10-25 DIAGNOSIS — I739 Peripheral vascular disease, unspecified: Secondary | ICD-10-CM | POA: Insufficient documentation

## 2011-10-25 DIAGNOSIS — Z87891 Personal history of nicotine dependence: Secondary | ICD-10-CM | POA: Insufficient documentation

## 2011-10-25 NOTE — ED Notes (Signed)
Pt c/o cough x 3 weeks. Pt seen this past week, dx'd yesterday w/ bronchitis. Pt was not rx'd meds for this. Pt has dry, persistent cough in triage.

## 2011-10-26 ENCOUNTER — Emergency Department (HOSPITAL_COMMUNITY): Payer: Medicare Other

## 2011-10-26 MED ORDER — BENZONATATE 100 MG PO CAPS: 100.0000 mg | ORAL_CAPSULE | Freq: Three times a day (TID) | ORAL | Status: AC | PRN

## 2011-10-26 MED ORDER — FEXOFENADINE HCL 60 MG PO TABS: 60.0000 mg | ORAL_TABLET | Freq: Two times a day (BID) | ORAL | Status: DC

## 2011-10-26 MED ORDER — HYDROCODONE-HOMATROPINE 5-1.5 MG/5ML PO SYRP: 2.5000 mL | ORAL_SOLUTION | Freq: Four times a day (QID) | ORAL | Status: DC | PRN

## 2011-10-26 NOTE — ED Provider Notes (Signed)
History     CSN: [id number]  Arrival date & time 10/25/11  2341   First MD Initiated Contact with Patient 10/26/11 0109      Chief Complaint  Patient presents with  . Cough    (Consider location/radiation/quality/duration/timing/severity/associated sxs/prior treatment) Patient is a 66 y.o. male presenting with cough. The history is provided by the patient, the spouse and medical records.  Cough Pertinent negatives include no chest pain, no headaches, no shortness of breath and no wheezing.   Shawn Fernandez is a 66 y.o. male presents to the emergency department complaining of cough.  The onset of the symptoms was  gradual starting 3 weeks ago.  The patient has no associated symptoms.  The symptoms have been  persistent, gradually worsened.  Nothing makes the symptoms worse and nothing makes symptoms better.  The patient denies fever, chills, headache, nausea, vomiting, chest pain, shortness of breath, wheezing, myalgia, arthralgia, rash.  Patient states he began coughing approximately 3 weeks ago and was evaluated by his primary care physician at that time. He was also taking lisinopril at that time. The cough is a two-view to an ACE inhibitor and the medication was discontinued.  Patient was seen 5 days ago by his primary care for this cough. Chest x-ray was done at that time and he was called by the primary care office yesterday and diagnosed with bronchitis.  Tonight he was eating a cough drop when he choked on it.  Patient states he coughed a lot and then vomited.  He states the cough drop did come up.  He is concerned about continued cough.  He describes the cough as productive.  He describes sputum as white.  He also states postnasal drip.  The patient has medical history significant for:  Past Medical History  Diagnosis Date  . Peripheral vascular disease   . HTN (hypertension)   . Dyslipidemia   . FHx: heart disease      Past Surgical History  Procedure Date  . US  echocardiography 07-20-2007    EF 55-60%    History reviewed. No pertinent family history.  History  Substance Use Topics  . Smoking status: Former Games developer  . Smokeless tobacco: Not on file   Comment: Quit   . Alcohol Use:      Review of Systems  Constitutional: Negative for fever, diaphoresis, appetite change, fatigue and unexpected weight change.  HENT: Positive for postnasal drip. Negative for mouth sores.   Eyes: Negative for visual disturbance.  Respiratory: Positive for cough. Negative for chest tightness, shortness of breath and wheezing.   Cardiovascular: Negative for chest pain.  Gastrointestinal: Negative for nausea, vomiting, abdominal pain, diarrhea and constipation.  Genitourinary: Negative for dysuria, urgency, frequency and hematuria.  Musculoskeletal: Negative for back pain.  Skin: Negative for rash.  Neurological: Negative for syncope, light-headedness and headaches.  Hematological: Does not bruise/bleed easily.  Psychiatric/Behavioral: Negative for disturbed wake/sleep cycle. The patient is not nervous/anxious.     Allergies  Review of patient's allergies indicates no known allergies.  Home Medications   Current Outpatient Rx  Name Route Sig Dispense Refill  . GUAIFENESIN-DM 100-10 MG/5ML PO SYRP Oral Take 10 mLs by mouth 3 (three) times daily as needed. Cough    . LOSARTAN POTASSIUM 100 MG PO TABS Oral Take 100 mg by mouth daily.    Carma Leaven M PLUS PO TABS Oral Take 1 tablet by mouth daily.    Marland Kitchen SIMVASTATIN 40 MG PO TABS Oral Take 40 mg  by mouth every evening.    Marland Kitchen BENZONATATE 100 MG PO CAPS Oral Take 1 capsule (100 mg total) by mouth 3 (three) times daily as needed for cough (cough). 20 capsule 0  . CLOPIDOGREL BISULFATE 75 MG PO TABS Oral Take 75 mg by mouth daily.    Marland Kitchen FEXOFENADINE HCL 60 MG PO TABS Oral Take 1 tablet (60 mg total) by mouth 2 (two) times daily. 60 tablet 0  . HYDROCODONE-HOMATROPINE 5-1.5 MG/5ML PO SYRP Oral Take 2.5 mLs by mouth every 6  (six) hours as needed for cough. 120 mL 0    There were no vitals taken for this visit.  Physical Exam  Nursing note and vitals reviewed. Constitutional: He appears well-developed and well-nourished. No distress.  HENT:  Head: Normocephalic and atraumatic.  Mouth/Throat: Oropharynx is clear and moist. No oropharyngeal exudate.  Eyes: Conjunctivae are normal. No scleral icterus.  Neck: Normal range of motion. Neck supple.  Cardiovascular: Normal rate, regular rhythm, normal heart sounds and intact distal pulses.  Exam reveals no gallop and no friction rub.   No murmur heard. Pulmonary/Chest: Effort normal and breath sounds normal. No respiratory distress. He has no wheezes. He exhibits no tenderness.  Abdominal: Soft. Bowel sounds are normal. He exhibits no mass. There is no tenderness. There is no rebound and no guarding.  Musculoskeletal: Normal range of motion. He exhibits no edema.  Neurological: He is alert.       Speech is clear and goal oriented Moves extremities without ataxia  Skin: Skin is warm and dry. No rash noted. He is not diaphoretic.  Psychiatric: He has a normal mood and affect.    ED Course  Procedures (including critical care time)  Labs Reviewed - No data to display Dg Chest 2 View  10/26/2011  *RADIOLOGY REPORT*  Clinical Data: 66 year old male with cough.  CHEST - 2 VIEW  Comparison: 10/20/2011 and earlier.  Findings: Slightly lower lung volumes.  Elevation of the left hemidiaphragm similar to before.  Cardiac size and mediastinal contours are within normal limits.  Visualized tracheal air column is within normal limits.  Subsequent crowding lung markings.  No consolidation or confluent airspace disease.  No pneumothorax, pulmonary edema or pleural effusion. No acute osseous abnormality identified.  IMPRESSION: Lower lung volumes with crowding of lung markings.  Otherwise no acute cardiopulmonary abnormality.   Original Report Authenticated By: Harley Hallmark, M.D.    Dg Chest 2 View  10/20/2011  *RADIOLOGY REPORT*  Clinical Data: Cough for 3 weeks, former smoking history  CHEST - 2 VIEW  Comparison: Chest x-ray of 07/18/2011  Findings: The lungs are clear and slightly hyperaerated.  Mild peribronchial thickening is noted.  Mediastinal contours appear stable.  The heart is within normal limits in size.  No bony abnormality is seen.  IMPRESSION: No active lung disease.  Peribronchial thickening may indicate bronchitis, possibly chronic.   Original Report Authenticated By: Juline Patch, M.D.    Dg Chest 2 View  10/26/2011  *RADIOLOGY REPORT*  Clinical Data: 66 year old male with cough.  CHEST - 2 VIEW  Comparison: 10/20/2011 and earlier.  Findings: Slightly lower lung volumes.  Elevation of the left hemidiaphragm similar to before.  Cardiac size and mediastinal contours are within normal limits.  Visualized tracheal air column is within normal limits.  Subsequent crowding lung markings.  No consolidation or confluent airspace disease.  No pneumothorax, pulmonary edema or pleural effusion. No acute osseous abnormality identified.  IMPRESSION: Lower lung volumes with crowding of  lung markings.  Otherwise no acute cardiopulmonary abnormality.   Original Report Authenticated By: Harley Hallmark, M.D.       1. Cough   2. Post-tussive vomiting       MDM  JAYMARI CROMIE presents with persistent cough.  Patient has multiple etiologies for his cough.  He has been off the lisinopril for less than 3 weeks, postnasal drip indicates possible seasonal allergies and possible diagnosis of bronchitis.  I have discussed all of these cough etiologies with the patient and his wife. Chest x-ray without acute cardiopulmonary abnormality. No pneumonia seen.  I have recommended symptomatic treatment at this time.  I have encouraged them that the cough or bronchitis can last up to 6 weeks.  I have also discussed reasons to return immediately to the ER.  Patient states understanding.      1. Medications: Tessalon Perles for daytime, Hycodan for nighttime, Allegra 2. Treatment: Rest, drink any fluids, take medications as prescribed, began over-the-counter seasonal allergy medication, take Mucinex without decongestant  As directed on package 3. Follow Up: With primary care if cough persists         Dierdre Forth, PA-C 10/26/11 0207

## 2011-10-26 NOTE — ED Notes (Signed)
Patient transported to X-ray 

## 2011-10-27 NOTE — ED Provider Notes (Signed)
Medical screening examination/treatment/procedure(s) were performed by non-physician practitioner and as supervising physician I was immediately available for consultation/collaboration.   Lyanne Co, MD 10/27/11 [phone]

## 2011-11-06 ENCOUNTER — Ambulatory Visit (INDEPENDENT_AMBULATORY_CARE_PROVIDER_SITE_OTHER): Payer: Medicare Other | Admitting: Internal Medicine

## 2011-11-06 ENCOUNTER — Encounter: Payer: Self-pay | Admitting: Internal Medicine

## 2011-11-06 VITALS — BP 144/78 | HR 67 | Temp 98.0°F | Ht 69.0 in | Wt 145.8 lb

## 2011-11-06 DIAGNOSIS — R059 Cough, unspecified: Secondary | ICD-10-CM

## 2011-11-06 DIAGNOSIS — R05 Cough: Secondary | ICD-10-CM | POA: Insufficient documentation

## 2011-11-06 DIAGNOSIS — J069 Acute upper respiratory infection, unspecified: Secondary | ICD-10-CM | POA: Insufficient documentation

## 2011-11-06 MED ORDER — OMEPRAZOLE 20 MG PO CPDR: 20.0000 mg | DELAYED_RELEASE_CAPSULE | Freq: Every day | ORAL | Status: DC

## 2011-11-06 MED ORDER — PREDNISONE (PAK) 10 MG PO TABS: ORAL_TABLET | ORAL | Status: AC

## 2011-11-06 MED ORDER — FAMOTIDINE 20 MG PO TABS: ORAL_TABLET | ORAL | Status: DC

## 2011-11-06 MED ORDER — TRAMADOL HCL 50 MG PO TABS: ORAL_TABLET | ORAL | Status: AC

## 2011-11-06 NOTE — Assessment & Plan Note (Addendum)
The most common causes of chronic cough in immunocompetent adults include the following: upper airway cough syndrome (UACS), previously referred to as postnasal drip syndrome (PNDS), which is caused by variety of rhinosinus conditions; (2) asthma; (3) GERD; (4) chronic bronchitis from cigarette smoking or other inhaled environmental irritants; (5) nonasthmatic eosinophilic bronchitis; and (6) bronchiectasis.   These conditions, singly or in combination, have accounted for up to 94% of the causes of chronic cough in prospective studies.   Other conditions have constituted no >6% of the causes in prospective studies These have included bronchogenic carcinoma, chronic interstitial pneumonia, sarcoidosis, left ventricular failure, ACEI-induced cough, and aspiration from a condition associated with pharyngeal dysfunction.   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   These are the same pts (now being labeled as having "irritable larynx syndrome" by some cough centers) who not infrequently have a history of having failed to tolerate ace inhibitors,  dry powder inhalers or biphosphonates or report having atypical reflux symptoms that don't respond to standard doses of PPI , and are easily confused as having aecopd or asthma flares by even experienced allergists/ pulmonologists.   For now max rx for cyclical cough then regroup in 2 weeks if not satisfied.   Would avoid acei here indefinitely  See instructions for specific recommendations which were reviewed directly  with the patient who was given a copy with highlighter outlining the key components with a diagram of the problem on the back indicating how to eliminate cyclical cough/ throat clearing

## 2011-11-06 NOTE — Progress Notes (Signed)
  Subjective:    Patient ID: Shawn Fernandez, male    DOB: January 05, 1946  MRN: [id number]  HPI  42 yobm quit smoking 07/2011 with no breathing problems but not aerobically active and new cough p quit and stopped acei in august 2013 and improving but still coughing so referred 11/06/2011  for pulmonary evaluation by Dr Kellie Shropshire.  11/06/2011 1st pulmonary eval cc cough x 3 months better off acei and better with cough med but still waking up evening at worse immediately on lying down.  Minimal white mucus, No obvious daytime variabilty or assoc excess or purulent mucus or cp or chest tightness, subjective wheeze overt sinus or hb symptoms. No unusual exp hx or h/o childhood pna/ asthma or premature birth to his knowledge.     Also denies any obvious fluctuation of symptoms with weather or environmental changes or other aggravating or alleviating factors except as outlined above     Review of Systems  Constitutional: Negative for fever, chills, activity change, appetite change and unexpected weight change.  HENT: Negative for congestion, sore throat, rhinorrhea, sneezing, trouble swallowing, dental problem, voice change and postnasal drip.   Eyes: Negative for visual disturbance.  Respiratory: Positive for cough. Negative for choking and shortness of breath.   Cardiovascular: Negative for chest pain and leg swelling.  Gastrointestinal: Negative for nausea, vomiting and abdominal pain.  Genitourinary: Negative for difficulty urinating.  Musculoskeletal: Negative for arthralgias.  Skin: Negative for rash.  Psychiatric/Behavioral: Negative for behavioral problems and confusion.       Objective:   Physical Exam  Wt Readings from Last 3 Encounters:  11/06/11 145 lb 12.8 oz (66.134 kg)  07/24/11 149 lb (67.586 kg)  07/24/11 149 lb (67.586 kg)  amb bm freq throat clearing HEENT mild turbinate edema.  Ears mild wax retention, no cough. Oropharynx no thrush or excess pnd or cobblestoning.  No JVD or  cervical adenopathy. Min accessory muscle hypertrophy. Trachea midline, nl thryroid. Chest was min hyperinflated by percussion with diminished breath sounds and min increased exp time without wheeze. Hoover sign positive at late inspiration. Regular rate and rhythm without murmur gallop or rub or increase P2 or edema.  Abd: no hsm, nl excursion. Ext warm without cyanosis or clubbing.     cxr 10/26/11  Lower lung volumes with crowding of lung markings. Otherwise no  acute cardiopulmonary abnormality.      Assessment & Plan:

## 2011-11-06 NOTE — Patient Instructions (Addendum)
The key to effective treatment for your cough is eliminating the non-stop cycle of cough you're stuck in long enough to let your airway heal completely and then see if there is anything still making you cough once you stop the cough suppression, but this should take no more than 5 days to figuure out  First take delsym two tsp every 12 hours and supplement if needed with  tramadol 50 mg up to 2 every 4 hours to suppress the urge to cough. Swallowing water or using ice chips/non mint and menthol containing candies (such as lifesavers or sugarless jolly ranchers) are also effective.  You should rest your voice and avoid activities that you know make you cough.  Once you have eliminated the cough for 3 straight days try reducing the tramadol first,  then the delsym as tolerated.    Try prilosec 20mg   Take 30-60 min before first meal of the day and Pepcid 20 mg one bedtime until cough is completely gone for at least a week without the need for cough suppression   GERD (REFLUX)  is an extremely common cause of respiratory symptoms, many times with no significant heartburn at all.    It can be treated with medication, but also with lifestyle changes including avoidance of late meals, excessive alcohol, smoking cessation, and avoid fatty foods, chocolate, peppermint, colas, red wine, and acidic juices such as orange juice.  NO MINT OR MENTHOL PRODUCTS SO NO COUGH DROPS  USE SUGARLESS CANDY INSTEAD (jolley ranchers or Stover's)  NO OIL BASED VITAMINS - use powdered substitutes.    Prednisone 10 mg take  4 each am x 2 days,   2 each am x 2 days,  1 each am x2days and stop     If you are satisfied with your treatment plan let your doctor know and he/she can either refill your medications or you can return here when your prescription runs out.     If in any way you are not 100% satisfied,  please tell us.  If 100% better, tell your friends!

## 2012-01-08 ENCOUNTER — Encounter: Payer: Self-pay | Admitting: *Deleted

## 2012-01-23 ENCOUNTER — Other Ambulatory Visit (HOSPITAL_COMMUNITY): Payer: Self-pay | Admitting: Cardiovascular Disease

## 2012-01-23 DIAGNOSIS — I739 Peripheral vascular disease, unspecified: Secondary | ICD-10-CM

## 2012-01-23 DIAGNOSIS — I779 Disorder of arteries and arterioles, unspecified: Secondary | ICD-10-CM

## 2012-01-25 ENCOUNTER — Emergency Department (INDEPENDENT_AMBULATORY_CARE_PROVIDER_SITE_OTHER)
Admission: EM | Admit: 2012-01-25 | Discharge: 2012-01-25 | Disposition: A | Discharge status: Home / Self Care | Payer: Medicare Other | Source: Home / Self Care | Attending: Emergency Medicine | Admitting: Emergency Medicine

## 2012-01-25 ENCOUNTER — Emergency Department (INDEPENDENT_AMBULATORY_CARE_PROVIDER_SITE_OTHER): Payer: Medicare Other

## 2012-01-25 ENCOUNTER — Encounter (HOSPITAL_COMMUNITY): Payer: Self-pay | Admitting: Emergency Medicine

## 2012-01-25 DIAGNOSIS — J189 Pneumonia, unspecified organism: Secondary | ICD-10-CM

## 2012-01-25 HISTORY — DX: Acute embolism and thrombosis of other specified veins: I82.890

## 2012-01-25 LAB — CBC WITH DIFFERENTIAL/PLATELET
Basophils Absolute: 0.1 10*3/uL (ref 0.0–0.1)
Basophils Relative: 1 % (ref 0–1)
Eosinophils Absolute: 0.1 10*3/uL (ref 0.0–0.7)
Eosinophils Relative: 1 % (ref 0–5)
HCT: 35.8 % — ABNORMAL LOW (ref 39.0–52.0)
Hemoglobin: 11.9 g/dL — ABNORMAL LOW (ref 13.0–17.0)
Lymphocytes Relative: 17 % (ref 12–46)
Lymphs Abs: 1.8 10*3/uL (ref 0.7–4.0)
MCH: 27.6 pg (ref 26.0–34.0)
MCHC: 33.2 g/dL (ref 30.0–36.0)
MCV: 83.1 fL (ref 78.0–100.0)
Monocytes Absolute: 1 10*3/uL (ref 0.1–1.0)
Monocytes Relative: 9 % (ref 3–12)
Neutro Abs: 7.6 10*3/uL (ref 1.7–7.7)
Neutrophils Relative %: 73 % (ref 43–77)
Platelets: 220 10*3/uL (ref 150–400)
RBC: 4.31 MIL/uL (ref 4.22–5.81)
RDW: 15 % (ref 11.5–15.5)
WBC: 10.5 10*3/uL (ref 4.0–10.5)

## 2012-01-25 MED ORDER — CEFTRIAXONE SODIUM 1 G IJ SOLR
INTRAMUSCULAR | Status: AC
  Filled 2012-01-25: qty 10

## 2012-01-25 MED ORDER — LIDOCAINE HCL (PF) 1 % IJ SOLN
INTRAMUSCULAR | Status: AC
  Filled 2012-01-25: qty 5

## 2012-01-25 MED ORDER — ACETAMINOPHEN 325 MG PO TABS
ORAL_TABLET | ORAL | Status: AC
  Filled 2012-01-25: qty 2

## 2012-01-25 MED ORDER — GUAIFENESIN-CODEINE 100-10 MG/5ML PO SYRP: 10.0000 mL | ORAL_SOLUTION | Freq: Four times a day (QID) | ORAL | Status: DC | PRN

## 2012-01-25 MED ORDER — CEFTRIAXONE SODIUM 1 G IJ SOLR
1.0000 g | Freq: Once | INTRAMUSCULAR | Status: AC
  Administered 2012-01-25: 1 g via INTRAMUSCULAR

## 2012-01-25 MED ORDER — ACETAMINOPHEN 325 MG PO TABS
650.0000 mg | ORAL_TABLET | Freq: Once | ORAL | Status: AC
  Administered 2012-01-25: 650 mg via ORAL

## 2012-01-25 MED ORDER — DOXYCYCLINE HYCLATE 100 MG PO TABS: 100.0000 mg | ORAL_TABLET | Freq: Two times a day (BID) | ORAL | Status: DC

## 2012-01-25 NOTE — ED Provider Notes (Addendum)
Chief Complaint  Patient presents with  . Cough    History of Present Illness:   Shawn Fernandez is a 66 year old male smoker who has had a 3 to four-month history of a dry cough. Initially this was thought to be due to lisinopril, so his lisinopril was stopped. The cough improved for a while then about a month ago it came back again. The cough is mostly dry but productive of small amount of white sputum. It's worse at nighttime, he denies any wheezing, shortness of breath, or chest pain. He was referred by his primary care physician to Dr. Sandrea Hughs. 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 Again the cough improved, however over the past 2 weeks has again been worse. He denies any fever at home or chills but does have a fever here. He has had no nasal congestion, sneezing, rhinorrhea, postnasal drip, sore throat, or hoarseness. He denies any history of asthma or COPD. He has not had any abdominal pain, indigestion, heartburn, waterbrash. He is on Cozaar for his blood pressure, Plavix, simvastatin, Pepcid, Allegra, and Prilosec. He also takes Pletal for peripheral vascular disease.  Review of Systems:  Other than noted above, the patient denies any of the following symptoms: Systemic:  No fevers, chills, sweats, weight loss or gain, fatigue, or tiredness. ENT:  No nasal congestion, sneezing, itching, postnasal drip, sinus pressure, headache, sore throat, or hoarseness. Lungs:  No wheezing, shortness of breath, chest tightness or congestion. Heart:  No chest pain, tightness, pressure, PND, orthopnea, or ankle edema. GI:  No indigestion, heartburn, waterbrash, burping, abdominal pain, nausea, or vomiting.  PMFSH:  Past medical history, family history, social history, meds, and allergies were reviewed.  Specifically, there is no history of asthma, allergies, reflux esophagitis or cigarette smoking.   Physical Exam:   Vital signs:  BP 172/91   Pulse 99  Temp 98.7 F (37.1 C) (Oral)  Resp 20  SpO2 95% General:  Alert and oriented.  In no distress.  Skin warm and dry. ENT: TMs and ear canals normal.  Nasal mucosa normal, without drainage.  Pharynx clear without exudate or drainage.  No intraoral lesions. Neck:  No adenopathy, tenderness or mass.  No JVD. Lungs:  No respiratory distress.  Breath sounds clear and equal bilaterally.  No wheezes, rales or rhonchi. Heart:  Regular rhythm, no gallops or murmers.  No pedal edema. Abdomon:  Soft and nontender.  No organomegaly or mass.  Results for orders placed during the hospital encounter of 01/25/12  CBC WITH DIFFERENTIAL      Component Value Range   WBC 10.5  4.0 - 10.5 K/uL   RBC 4.31  4.22 - 5.81 MIL/uL   Hemoglobin 11.9 (*) 13.0 - 17.0 g/dL   HCT 13.0 (*) 86.5 - 78.4 %   MCV 83.1  78.0 - 100.0 fL   MCH 27.6  26.0 - 34.0 pg   MCHC 33.2  30.0 - 36.0 g/dL   RDW 69.6  29.5 - 28.4 %   Platelets 220  150 - 400 K/uL   Neutrophils Relative 73  43 - 77 %   Neutro Abs 7.6  1.7 - 7.7 K/uL   Lymphocytes Relative 17  12 - 46 %   Lymphs Abs 1.8  0.7 - 4.0 K/uL   Monocytes Relative 9  3 - 12 %   Monocytes Absolute 1.0  0.1 - 1.0 K/uL   Eosinophils Relative 1  0 -  5 %   Eosinophils Absolute 0.1  0.0 - 0.7 K/uL   Basophils Relative 1  0 - 1 %   Basophils Absolute 0.1  0.0 - 0.1 K/uL     Radiology:  Dg Chest 2 View  01/25/2012  *RADIOLOGY REPORT*  Clinical Data: Cough and fever.  CHEST - 2 VIEW  Comparison: PA and lateral chest 05/30/2011 and 07/18/2011.  CT chest 06/11/2011  Findings: The lungs are emphysematous.  There is focal left lower lobe airspace disease.  No pneumothorax or pleural fluid.  Heart size normal.  IMPRESSION: Emphysema and left lower lobe air.  Emphysema with left lower lobe airspace disease most consistent with pneumonia.  Recommend follow- up plain films to clearing.   Original Report Authenticated By: Holley Dexter, M.D.      Course in Urgent Care Center:    He was given Rocephin 1 g IM and tolerated this well without any immediate side effects.  Assessment:  The encounter diagnosis was Community acquired pneumonia.  Plan:   1.  The following meds were prescribed:   New Prescriptions   DOXYCYCLINE (VIBRA-TABS) 100 MG TABLET    Take 1 tablet (100 mg total) by mouth 2 (two) times daily.   GUAIFENESIN-CODEINE (GUIATUSS AC) 100-10 MG/5ML SYRUP    Take 10 mLs by mouth 4 (four) times daily as needed for cough.   2.  The patient was instructed in symptomatic care and handouts were given. 3.  The patient was told to return if becoming worse in any way, for a follow up with Dr. Sherene Sires later on this week, and given some red flag symptoms that would indicate earlier return.     Reuben Likes, MD 01/25/12 1902  Reuben Likes, MD 01/25/12 [phone]

## 2012-01-25 NOTE — ED Notes (Signed)
C/O cough x 3-4 months after being on lisinopril; med was discontinued with improvement in cough, though it continued some.  Approx 4 wks ago was on Z-Pack (unsure if bronchitis), but states he felt much better after that.  Over past week, cough has gotten a little worse, and now he is having difficulty sleeping.  Unaware of any fevers.  Has not been taking any meds for sxs.

## 2012-06-07 ENCOUNTER — Encounter (HOSPITAL_COMMUNITY): Payer: Medicare Other

## 2012-06-29 ENCOUNTER — Ambulatory Visit (HOSPITAL_COMMUNITY)
Admission: RE | Admit: 2012-06-29 | Discharge: 2012-06-29 | Disposition: A | Discharge status: Home / Self Care | Payer: Medicare Other | Source: Ambulatory Visit | Attending: Cardiovascular Disease | Admitting: Cardiovascular Disease

## 2012-06-29 DIAGNOSIS — I739 Peripheral vascular disease, unspecified: Secondary | ICD-10-CM | POA: Insufficient documentation

## 2012-06-29 DIAGNOSIS — I779 Disorder of arteries and arterioles, unspecified: Secondary | ICD-10-CM

## 2012-06-29 NOTE — Progress Notes (Signed)
Carotid artery duplex doppler was completed. Karter Hellmer RVT 

## 2012-06-29 NOTE — Progress Notes (Signed)
ABIs and lower extremity duplex doppler was completed. Bohden Dung RVT 

## 2012-07-19 ENCOUNTER — Telehealth: Payer: Self-pay | Admitting: General Practice

## 2012-07-19 NOTE — Telephone Encounter (Signed)
A call was placed to Mr. Shawn Fernandez to reschedule his appointment on 07/22/12 at 11:15, to 08/02/12 @ 8:45 or 2:30 pm. I proceeded to has Mr. Shawn Fernandez how he was feeling patient stated he feels great. I told him I did not want to reschedule his appointment if he was having any problems. Mr. Shawn Fernandez stated he received a call about the results of carotid/lower arterial duplex and did not understand why he need to come back this week.if his results were fine. Mr. Shawn Fernandez prefers to push his f/u appointment out until August. I told him I would forward the message to triage and someone will give him a call.

## 2012-07-20 ENCOUNTER — Encounter: Payer: Self-pay | Admitting: Emergency Medicine

## 2012-07-20 NOTE — Telephone Encounter (Signed)
Paper chart reviewed w/ K. Petra Kuba, Charity fundraiser.  Advised pt can push appt out until August, but needs to check BP regularly as his BP check appt was cancelled.  Stated pt can check BP at home, but needs to keep a record.  Call to pt and informed.  Pt stated he has been taking his BP and was advised to be sure he is keeping a record of them.  Appt rescheduled for 8.20.14 at 10am w/ Dr. Allyson Sabal.  Pt verbalized understanding and agreed w/ plan.

## 2012-07-22 ENCOUNTER — Ambulatory Visit: Payer: Medicare Other | Admitting: Cardiovascular Disease

## 2012-08-21 ENCOUNTER — Encounter: Payer: Self-pay | Admitting: Cardiovascular Disease

## 2012-08-31 ENCOUNTER — Ambulatory Visit
Admission: RE | Admit: 2012-08-31 | Discharge: 2012-08-31 | Disposition: A | Discharge status: Home / Self Care | Payer: Medicare Other | Source: Ambulatory Visit | Attending: Family | Admitting: Family

## 2012-08-31 ENCOUNTER — Other Ambulatory Visit: Payer: Self-pay | Admitting: Family

## 2012-08-31 DIAGNOSIS — R05 Cough: Secondary | ICD-10-CM

## 2012-08-31 DIAGNOSIS — R059 Cough, unspecified: Secondary | ICD-10-CM

## 2012-10-20 ENCOUNTER — Ambulatory Visit: Payer: Medicare Other | Admitting: Cardiovascular Disease

## 2012-10-27 ENCOUNTER — Encounter: Payer: Self-pay | Admitting: Cardiovascular Disease

## 2012-10-27 ENCOUNTER — Ambulatory Visit (INDEPENDENT_AMBULATORY_CARE_PROVIDER_SITE_OTHER): Payer: Medicare Other | Admitting: Cardiovascular Disease

## 2012-10-27 VITALS — BP 136/76 | HR 94 | Ht 69.0 in | Wt 145.0 lb

## 2012-10-27 DIAGNOSIS — E785 Hyperlipidemia, unspecified: Secondary | ICD-10-CM

## 2012-10-27 DIAGNOSIS — I10 Essential (primary) hypertension: Secondary | ICD-10-CM

## 2012-10-27 DIAGNOSIS — I779 Disorder of arteries and arterioles, unspecified: Secondary | ICD-10-CM

## 2012-10-27 DIAGNOSIS — Z79899 Other long term (current) drug therapy: Secondary | ICD-10-CM

## 2012-10-27 DIAGNOSIS — I739 Peripheral vascular disease, unspecified: Secondary | ICD-10-CM | POA: Insufficient documentation

## 2012-10-27 MED ORDER — SIMVASTATIN 20 MG PO TABS: 20.0000 mg | ORAL_TABLET | Freq: Every evening | ORAL | Status: DC

## 2012-10-27 MED ORDER — ASPIRIN EC 81 MG PO TBEC: 81.0000 mg | DELAYED_RELEASE_TABLET | Freq: Every day | ORAL | Status: DC

## 2012-10-27 NOTE — Progress Notes (Signed)
10/27/2012 Shawn Fernandez   1945/12/04  [id number]  Primary Physician Alva Garnet., MD Primary Cardiologist: Runell Gess MD Roseanne Reno   HPI:  The patient is a 67 year old thin appearing married African American male father of 1, grandfather to 2 grandchildren accompanied by his wife today. I last saw him 5 months ago. He is a retired [employer] architect, originally referred to me by Dr. Renae Gloss for peripheral vascular evaluation and claudication. His risk factors include hypertension, hyperlipidemia, and off and on tobacco abuse as well as family history. 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 He also complained of right greater than left lower extremity claudication at less than 1 block. He had Dopplers in our office which showed ABIs in the 0.7 range bilaterally. I angiogrammed him Jul 24, 2011 revealing patent stents to his SMA with 50% "in-stent restenosis." He had 60% stenosis in his right external iliac artery without a pullback gradient and diffuse SFA disease in the 50% range, as well as tibial disease with 2-vessel runoff on the right and 1 on the right. He is on Pletal. He currently denies claudication. There were no focal lesions which I could identify as "culprit" lesions warranting percutaneous intervention. Since I saw him a year ago he denies chest pain, shortness of breath or claudication     Current Outpatient Prescriptions  Medication Sig Dispense Refill  . amLODipine (NORVASC) 10 MG tablet Take 10 mg by mouth daily.      Marland Kitchen CILOSTAZOL PO Take 50 mg by mouth 2 (two) times daily.       . clopidogrel (PLAVIX) 75 MG tablet Take 75 mg by mouth daily.      Marland Kitchen losartan (COZAAR) 100 MG tablet Take 100 mg by mouth daily.      . Multiple Vitamins-Minerals (MULTIVITAMIN PO) Take by mouth.      Marland Kitchen omeprazole (PRILOSEC) 20 MG capsule Take 1 capsule (20 mg total) by mouth daily.  30 capsule  0  . fexofenadine  (ALLEGRA) 60 MG tablet Take 1 tablet (60 mg total) by mouth 2 (two) times daily.  60 tablet  0   No current facility-administered medications for this visit.    Allergies  Allergen Reactions  . Lisinopril Cough    History   Social History  . Marital Status: Married    Spouse Name: N/A    Number of Children: N/A  . Years of Education: N/A   Occupational History  . Not on file.   Social History Main Topics  . Smoking status: Current Every Day Smoker -- 0.75 packs/day for 37 years    Types: Cigarettes    Last Attempt to Quit: 07/02/2011  . Smokeless tobacco: Never Used  . Alcohol Use: Yes     Comment: only occ  . Drug Use: No  . Sexual Activity: Not on file   Other Topics Concern  . Not on file   Social History Narrative  . No narrative on file     Review of Systems: General: negative for chills, fever, night sweats or weight changes.  Cardiovascular: negative for chest pain, dyspnea on exertion, edema, orthopnea, palpitations, paroxysmal nocturnal dyspnea or shortness of breath Dermatological: negative for rash Respiratory: negative for cough or wheezing Urologic: negative for hematuria Abdominal: negative for nausea, vomiting, diarrhea, bright red blood per rectum, melena, or hematemesis Neurologic: negative for visual changes, syncope, or dizziness All other systems reviewed and are otherwise negative except as noted  above.    Blood pressure 136/76, pulse 94, height 5\' 9"  (1.753 m), weight 145 lb (65.772 kg).  General appearance: alert and no distress Neck: no adenopathy, no JVD, supple, symmetrical, trachea midline, thyroid not enlarged, symmetric, no tenderness/mass/nodules and soft bilateral carotid bruits Lungs: clear to auscultation bilaterally Heart: regular rate and rhythm, S1, S2 normal, no murmur, click, rub or gallop Extremities: extremities normal, atraumatic, no cyanosis or edema  EKG sinus rhythm at 94 without ST or T wave changes  ASSESSMENT  AND PLAN:   Essential hypertension Under good control on current medications  Hyperlipidemia Currently not on statin therapy though he has been on in the past. His most recent LDL from November was 124 with a particle number of 1144. Based on this am going to put him back on simvastatin  Peripheral arterial disease He has had stenting of his SMA at [facility] in the past. I angiogrammed him 07/24/11 revealing patent SMA stents, 60% right external iliac artery stenosis and diffuse 50% stenosis in both SFAs with two-vessel runoff on the right and one on the left. He really denies claudication. His daughters have remained stable. He also has moderate right and mild left internal carotid artery stenosis. Neurologically asymptomatic.       Runell Gess MD FACP,FACC,FAHA, [facility] 10/27/2012 2:51 PM

## 2012-10-27 NOTE — Assessment & Plan Note (Signed)
Currently not on statin therapy though he has been on in the past. His most recent LDL from November was 124 with a particle number of 1144. Based on this am going to put him back on simvastatin

## 2012-10-27 NOTE — Patient Instructions (Addendum)
Restart simvastatin 20mg  daily   Start aspirin 81mg  daily  Have bloodwork done in 2 months to recheck your cholesterol level   Dr Allyson Sabal will see you back in 1 year.

## 2012-10-27 NOTE — Assessment & Plan Note (Signed)
Under good control on current medications 

## 2012-10-27 NOTE — Assessment & Plan Note (Signed)
He has had stenting of his SMA at [facility] in the past. I angiogrammed him 07/24/11 revealing patent SMA stents, 60% right external iliac artery stenosis and diffuse 50% stenosis in both SFAs with two-vessel runoff on the right and one on the left. He really denies claudication. His daughters have remained stable. He also has moderate right and mild left internal carotid artery stenosis. Neurologically asymptomatic.

## 2012-10-29 ENCOUNTER — Other Ambulatory Visit: Payer: Self-pay | Admitting: *Deleted

## 2012-10-29 DIAGNOSIS — I779 Disorder of arteries and arterioles, unspecified: Secondary | ICD-10-CM

## 2013-01-13 ENCOUNTER — Ambulatory Visit (HOSPITAL_COMMUNITY)
Admission: RE | Admit: 2013-01-13 | Discharge: 2013-01-13 | Disposition: A | Discharge status: Home / Self Care | Payer: Medicare Other | Source: Ambulatory Visit | Attending: Cardiovascular Disease | Admitting: Cardiovascular Disease

## 2013-01-13 DIAGNOSIS — I779 Disorder of arteries and arterioles, unspecified: Secondary | ICD-10-CM

## 2013-01-13 DIAGNOSIS — I7090 Unspecified atherosclerosis: Secondary | ICD-10-CM | POA: Insufficient documentation

## 2013-01-13 NOTE — Progress Notes (Signed)
Carotid Duplex Completed. °Brianna L Mazza,RVT °

## 2013-01-18 ENCOUNTER — Encounter: Payer: Self-pay | Admitting: *Deleted

## 2013-01-18 ENCOUNTER — Telehealth: Payer: Self-pay | Admitting: *Deleted

## 2013-01-18 DIAGNOSIS — I6529 Occlusion and stenosis of unspecified carotid artery: Secondary | ICD-10-CM

## 2013-01-18 NOTE — Telephone Encounter (Signed)
Order placed for repeat carotid doppler in 6 months 

## 2013-01-18 NOTE — Telephone Encounter (Signed)
Message copied by Marella Bile on Tue Jan 18, 2013  8:31 AM ------      Message from: Runell Gess      Created: Sun Jan 16, 2013 10:46 AM       No change from prior study. Repeat in 6 months ------

## 2013-07-15 ENCOUNTER — Encounter (HOSPITAL_COMMUNITY): Payer: Medicare Other

## 2013-07-15 ENCOUNTER — Telehealth: Payer: Self-pay | Admitting: *Deleted

## 2013-07-15 NOTE — Telephone Encounter (Signed)
Dr Gwenlyn Found reviewed the chart and gave permission to hold ASA and Plavix.  Form signed and faxed back to Dr Benson Norway.

## 2013-07-20 ENCOUNTER — Ambulatory Visit (HOSPITAL_COMMUNITY)
Admission: RE | Admit: 2013-07-20 | Discharge: 2013-07-20 | Disposition: A | Discharge status: Home / Self Care | Payer: Medicare Other | Source: Ambulatory Visit | Attending: Cardiology | Admitting: Cardiology

## 2013-07-20 DIAGNOSIS — I779 Disorder of arteries and arterioles, unspecified: Secondary | ICD-10-CM

## 2013-07-20 DIAGNOSIS — I739 Peripheral vascular disease, unspecified: Secondary | ICD-10-CM

## 2013-07-20 DIAGNOSIS — I6529 Occlusion and stenosis of unspecified carotid artery: Secondary | ICD-10-CM | POA: Insufficient documentation

## 2013-07-20 NOTE — Progress Notes (Signed)
Carotid Duplex Completed. Darian Ace, BS, RDMS, RVT  

## 2013-07-25 ENCOUNTER — Telehealth: Payer: Self-pay | Admitting: *Deleted

## 2013-07-25 ENCOUNTER — Encounter: Payer: Self-pay | Admitting: *Deleted

## 2013-07-25 DIAGNOSIS — I6529 Occlusion and stenosis of unspecified carotid artery: Secondary | ICD-10-CM

## 2013-07-25 NOTE — Telephone Encounter (Signed)
Order placed for repeat carotid dopplers in 1 year  

## 2013-07-25 NOTE — Telephone Encounter (Signed)
Message copied by Chauncy Lean on Mon Jul 25, 2013 11:34 AM ------      Message from: Lorretta Harp      Created: Fri Jul 22, 2013  7:13 AM       No change from prior study. Repeat in 12 months. ------

## 2013-08-04 ENCOUNTER — Other Ambulatory Visit: Payer: Self-pay | Admitting: Gastroenterology

## 2013-08-08 ENCOUNTER — Encounter (HOSPITAL_COMMUNITY): Payer: Self-pay | Admitting: *Deleted

## 2013-08-08 ENCOUNTER — Encounter (HOSPITAL_COMMUNITY): Payer: Self-pay | Admitting: Pharmacy Technician

## 2013-08-13 ENCOUNTER — Encounter: Payer: Self-pay | Admitting: Cardiovascular Disease

## 2013-08-13 ENCOUNTER — Telehealth: Payer: Self-pay | Admitting: Cardiovascular Disease

## 2013-08-15 NOTE — Telephone Encounter (Signed)
Closed encounter °

## 2013-09-16 ENCOUNTER — Encounter (HOSPITAL_COMMUNITY): Payer: Medicare Other | Admitting: *Deleted

## 2013-09-16 ENCOUNTER — Ambulatory Visit (HOSPITAL_COMMUNITY): Payer: Medicare Other | Admitting: *Deleted

## 2013-09-16 ENCOUNTER — Ambulatory Visit (HOSPITAL_COMMUNITY)
Admission: RE | Admit: 2013-09-16 | Discharge: 2013-09-16 | Disposition: A | Discharge status: Home / Self Care | Payer: Medicare Other | Source: Ambulatory Visit | Attending: Gastroenterology | Admitting: Gastroenterology

## 2013-09-16 ENCOUNTER — Encounter (HOSPITAL_COMMUNITY): Payer: Self-pay | Admitting: *Deleted

## 2013-09-16 ENCOUNTER — Encounter (HOSPITAL_COMMUNITY)
Admission: RE | Disposition: A | Discharge status: Home / Self Care | Payer: Self-pay | Source: Ambulatory Visit | Attending: Gastroenterology

## 2013-09-16 DIAGNOSIS — Z79899 Other long term (current) drug therapy: Secondary | ICD-10-CM | POA: Insufficient documentation

## 2013-09-16 DIAGNOSIS — Z860100 Personal history of colon polyps, unspecified: Secondary | ICD-10-CM | POA: Insufficient documentation

## 2013-09-16 DIAGNOSIS — K5730 Diverticulosis of large intestine without perforation or abscess without bleeding: Secondary | ICD-10-CM | POA: Insufficient documentation

## 2013-09-16 DIAGNOSIS — I10 Essential (primary) hypertension: Secondary | ICD-10-CM | POA: Insufficient documentation

## 2013-09-16 DIAGNOSIS — D126 Benign neoplasm of colon, unspecified: Secondary | ICD-10-CM | POA: Insufficient documentation

## 2013-09-16 DIAGNOSIS — Z87891 Personal history of nicotine dependence: Secondary | ICD-10-CM | POA: Insufficient documentation

## 2013-09-16 HISTORY — DX: Personal history of other medical treatment: Z92.89

## 2013-09-16 HISTORY — PX: COLONOSCOPY WITH PROPOFOL: SHX5780

## 2013-09-16 LAB — HM COLONOSCOPY

## 2013-09-16 SURGERY — COLONOSCOPY WITH PROPOFOL
Anesthesia: Monitor Anesthesia Care

## 2013-09-16 MED ORDER — LACTATED RINGERS IV SOLN
INTRAVENOUS | Status: DC
  Administered 2013-09-16: 1000 mL via INTRAVENOUS

## 2013-09-16 MED ORDER — PROPOFOL INFUSION 10 MG/ML OPTIME
INTRAVENOUS | Status: DC | PRN
  Administered 2013-09-16: 300 ug/kg/min via INTRAVENOUS

## 2013-09-16 MED ORDER — ESMOLOL HCL 10 MG/ML IV SOLN
INTRAVENOUS | Status: DC | PRN
  Administered 2013-09-16: 10 mg via INTRAVENOUS

## 2013-09-16 MED ORDER — GLYCOPYRROLATE 0.2 MG/ML IJ SOLN
INTRAMUSCULAR | Status: DC | PRN
  Administered 2013-09-16: 0.2 mg via INTRAVENOUS

## 2013-09-16 MED ORDER — SODIUM CHLORIDE 0.9 % IV SOLN: INTRAVENOUS | Status: DC

## 2013-09-16 MED ORDER — PROPOFOL 10 MG/ML IV BOLUS
INTRAVENOUS | Status: AC
  Filled 2013-09-16: qty 40

## 2013-09-16 SURGICAL SUPPLY — 21 items

## 2013-09-16 NOTE — Anesthesia Preprocedure Evaluation (Signed)
Anesthesia Evaluation  Patient identified by MRN, date of birth, ID band Patient awake    Reviewed: Allergy & Precautions, H&P , NPO status , Patient's Chart, lab work & pertinent test results  Airway Mallampati: II TM Distance: >3 FB Neck ROM: Full    Dental no notable dental hx.    Pulmonary neg pulmonary ROS, former smoker,  breath sounds clear to auscultation  Pulmonary exam normal       Cardiovascular hypertension, Pt. on medications + Peripheral Vascular Disease Rhythm:Regular Rate:Normal     Neuro/Psych negative neurological ROS  negative psych ROS   GI/Hepatic negative GI ROS, Neg liver ROS,   Endo/Other  negative endocrine ROS  Renal/GU negative Renal ROS  negative genitourinary   Musculoskeletal negative musculoskeletal ROS (+)   Abdominal   Peds negative pediatric ROS (+)  Hematology negative hematology ROS (+)   Anesthesia Other Findings   Reproductive/Obstetrics negative OB ROS                           Anesthesia Physical Anesthesia Plan  ASA: III  Anesthesia Plan: MAC   Post-op Pain Management:    Induction: Intravenous  Airway Management Planned: Nasal Cannula  Additional Equipment:   Intra-op Plan:   Post-operative Plan:   Informed Consent: I have reviewed the patients History and Physical, chart, labs and discussed the procedure including the risks, benefits and alternatives for the proposed anesthesia with the patient or authorized representative who has indicated his/her understanding and acceptance.   Dental advisory given  Plan Discussed with: CRNA and Surgeon  Anesthesia Plan Comments:         Anesthesia Quick Evaluation

## 2013-09-16 NOTE — Anesthesia Postprocedure Evaluation (Signed)
  Anesthesia Post-op Note  Patient: Shawn Fernandez  Procedure(s) Performed: Procedure(s) (LRB): COLONOSCOPY WITH PROPOFOL (N/A)  Patient Location: PACU  Anesthesia Type: MAC  Level of Consciousness: awake and alert   Airway and Oxygen Therapy: Patient Spontanous Breathing  Post-op Pain: mild  Post-op Assessment: Post-op Vital signs reviewed, Patient's Cardiovascular Status Stable, Respiratory Function Stable, Patent Airway and No signs of Nausea or vomiting  Last Vitals:  Filed Vitals:   09/16/13 0940  BP: 155/63  Temp: 36.6 C  Resp: 17    Post-op Vital Signs: stable   Complications: No apparent anesthesia complications

## 2013-09-16 NOTE — Transfer of Care (Signed)
Immediate Anesthesia Transfer of Care Note  Patient: Shawn Fernandez  Procedure(s) Performed: Procedure(s) (LRB): COLONOSCOPY WITH PROPOFOL (N/A)  Patient Location: PACU  Anesthesia Type: MAC  Level of Consciousness: sedated, patient cooperative and responds to stimulation  Airway & Oxygen Therapy: Patient Spontanous Breathing and Patient connected to face mask oxgen  Post-op Assessment: Report given to PACU RN and Post -op Vital signs reviewed and stable  Post vital signs: Reviewed and stable  Complications: No apparent anesthesia complications

## 2013-09-16 NOTE — Addendum Note (Signed)
Addendum created 09/16/13 1150 by Isa Rankin, CRNA   Modules edited: Anesthesia LDA

## 2013-09-16 NOTE — Op Note (Signed)
[facility] [place], [address]   OPERATIVE PROCEDURE REPORT  PATIENT: Shawn Fernandez, Shawn Fernandez  MR#: [id number] BIRTHDATE: 05/10/1945  GENDER: Male ENDOSCOPIST: Carol Ada, MD ASSISTANT:   Tedra Coupe, Cleda Daub, RN CGRN PROCEDURE DATE: 09/16/2013 PROCEDURE:   Colonoscopy with snare polypectomy ASA CLASS:   Class III INDICATIONS: Personal history of polyps MEDICATIONS: MAC sedation, administered by CRNA  DESCRIPTION OF PROCEDURE:   After the risks benefits and alternatives of the procedure were thoroughly explained, informed consent was obtained.  A digital rectal exam revealed no abnormalities of the rectum.    The Pentax Ped Colon [id number] endoscope was introduced through the anus  and advanced to the cecum, which was identified by both the appendix and ileocecal valve , No adverse events experienced.    The quality of the prep was excellent. .  The instrument was then slowly withdrawn as the colon was fully examined.     FINDINGS: A 3 mm sessile hepatic flexure polyp was removed with a cold snare.  Seeral small sigmoid colon polyps were identified and two representative samples were obtained.  In the ascending colon multiple diverticula were found.  No evidence of any masses, inflammation, ulcerations, eorsions, or vascular abnormalities.. The scope was then withdrawn from the patient and the procedure terminated.  COMPLICATIONS: There were no complications.  IMPRESSION: 1) Polyps. 2) Diverticula.  RECOMMENDATIONS: 1) Await biopsy results. 2) Repeat the colonoscopy in 5 years.  _______________________________ eSignedCarol Ada, MD 09/16/2013 10:51 AM

## 2013-09-16 NOTE — Discharge Instructions (Addendum)

## 2013-09-16 NOTE — H&P (Signed)
  Shawn Fernandez HPI: This is a 68 year old male with a PMH of intestinal angina s/p stent placement and treatment with Plavix, personal history of a descending colon polyp, hyerplipidemia, and HTN who is here for a follow up colonoscopy.  During the interval time period he denies any issues with hematochezia, melena, abdominal pain, nausea, vomiting, diarrhea, or constipation.  Past Medical History  Diagnosis Date  . Peripheral vascular disease   . HTN (hypertension)   . Dyslipidemia   . FHx: heart disease   . Blood clot in abdominal vein     treated at [facility]  . Carotid artery disease   . History of blood transfusion 8-9 yrs ago    Past Surgical History  Procedure Laterality Date  . US echocardiography  07-20-2007    EF 55-60%  . Ankle surgery Right yrs ago  . Stent in abdominal vein  4-5 yrs ago  . Arm surgery Left yrs ago    2 rods inserted, 1 rod later removed    Family History  Problem Relation Age of Onset  . Heart disease Brother   . Heart disease Mother   . Cancer Father     ? type    Social History:  reports that he quit smoking about 4 years ago. His smoking use included Cigarettes. He has a 15 pack-year smoking history. He has never used smokeless tobacco. He reports that he drinks alcohol. He reports that he does not use illicit drugs.  Allergies:  Allergies  Allergen Reactions  . Lisinopril Cough    Medications:  Scheduled:  Continuous: . sodium chloride    . lactated ringers 1,000 mL (09/16/13 0946)    No results found for this or any previous visit (from the past 24 hour(s)).   No results found.  ROS:  As stated above in the HPI otherwise negative.  Blood pressure 155/63, temperature 97.9 F (36.6 C), temperature source Oral, resp. rate 17, height 5\' 9"  (1.753 m), weight 152 lb (68.947 kg), SpO2 100.00%.    PE: Gen: NAD, Alert and Oriented HEENT:  Shawn Fernandez/AT, EOMI Neck: Supple, no LAD Lungs: CTA Bilaterally CV: RRR without M/G/R ABM: Soft,  NTND, +BS Ext: No C/C/E, broken left arm.  Assessment/Plan: 1) Personal history of colon polyps.  Plan: 1) Colonoscopy today.  Shawn Fernandez D 09/16/2013, 10:11 AM

## 2013-09-19 ENCOUNTER — Encounter (HOSPITAL_COMMUNITY): Payer: Self-pay | Admitting: Gastroenterology

## 2013-10-21 ENCOUNTER — Ambulatory Visit: Payer: Medicare Other | Admitting: Cardiovascular Disease

## 2013-11-01 ENCOUNTER — Ambulatory Visit (INDEPENDENT_AMBULATORY_CARE_PROVIDER_SITE_OTHER): Payer: Medicare Other | Admitting: Cardiovascular Disease

## 2013-11-01 ENCOUNTER — Encounter: Payer: Self-pay | Admitting: Cardiovascular Disease

## 2013-11-01 ENCOUNTER — Encounter (HOSPITAL_COMMUNITY): Payer: Self-pay | Admitting: *Deleted

## 2013-11-01 VITALS — BP 130/86 | HR 70 | Ht 69.0 in | Wt 146.0 lb

## 2013-11-01 DIAGNOSIS — I739 Peripheral vascular disease, unspecified: Secondary | ICD-10-CM

## 2013-11-01 DIAGNOSIS — Z79899 Other long term (current) drug therapy: Secondary | ICD-10-CM

## 2013-11-01 DIAGNOSIS — I779 Disorder of arteries and arterioles, unspecified: Secondary | ICD-10-CM

## 2013-11-01 DIAGNOSIS — I10 Essential (primary) hypertension: Secondary | ICD-10-CM

## 2013-11-01 DIAGNOSIS — E785 Hyperlipidemia, unspecified: Secondary | ICD-10-CM

## 2013-11-01 NOTE — Assessment & Plan Note (Signed)
On statin therapy. We will recheck a lipid and liver profile 

## 2013-11-01 NOTE — Assessment & Plan Note (Signed)
History of angiography which I performed 07/24/2011. He had a moderate right external iliac artery stenosis in the 60% range, diffuse 50% SFA disease bilaterally with 2 vessel runoff on the right and one vessel on the left. I did also images mesenteric artery which showed patent stents with 50% in-stent restenosis and. He does complain of some mild right calf claudication which has not changed in severity over the past year. I am going to recheck a lower extremity arterial Doppler study.

## 2013-11-01 NOTE — Progress Notes (Signed)
11/01/2013 Shawn Fernandez   [birth year]  [id number]  Primary Physician [name], MD Primary Cardiologist: Lorretta Harp MD Renae Gloss   HPI:  The patient is a 68 year old thin appearing married African American male father of 53, grandfather to 2 grandchildren accompanied by his wife today. I last saw him 12 months ago. He is a retired Administrator, originally referred to me by Dr. Karlton Lemon for peripheral vascular evaluation and claudication. His risk factors include hypertension, hyperlipidemia, and off and on tobacco abuse as well as family history. 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 He also complained of right greater than left lower extremity claudication at less than 1 block. He had Dopplers in our office which showed ABIs in the 0.7 range bilaterally. I angiogrammed him Jul 24, 2011 revealing patent stents to his SMA with 50% "in-stent restenosis." He had 60% stenosis in his right external iliac artery without a pullback gradient and diffuse SFA disease in the 50% range, as well as tibial disease with 2-vessel runoff on the right and 1 on the right. He is on Pletal. He currently denies claudication. There were no focal lesions which I could identify as "culprit" lesions warranting percutaneous intervention. Since I saw him a year ago he denies chest pain, shortness of breath but does complain of occasional right calf claudication. Of note, he has stopped smoking 9 months ago.   Current Outpatient Prescriptions  Medication Sig Dispense Refill  . amLODipine (NORVASC) 10 MG tablet Take 10 mg by mouth every morning.       Marland Kitchen aspirin EC 81 MG tablet Take 81 mg by mouth daily.      . cilostazol (PLETAL) 50 MG tablet Take 50 mg by mouth every morning.      Marland Kitchen losartan (COZAAR) 100 MG tablet Take 100 mg by mouth every morning.       . Multiple Vitamin (MULTIVITAMIN WITH MINERALS) TABS tablet Take 1 tablet by mouth  daily.      Marland Kitchen omeprazole (PRILOSEC) 20 MG capsule Take 20 mg by mouth daily.      . simvastatin (ZOCOR) 20 MG tablet Take 20 mg by mouth daily.       No current facility-administered medications for this visit.    Allergies  Allergen Reactions  . Lisinopril Cough    History   Social History  . Marital Status: Married    Spouse Name: N/A    Number of Children: N/A  . Years of Education: N/A   Occupational History  . Not on file.   Social History Main Topics  . Smoking status: Former Smoker -- 0.75 packs/day for 20 years    Types: Cigarettes    Quit date: 07/01/2009  . Smokeless tobacco: Never Used  . Alcohol Use: Yes     Comment: only occ  . Drug Use: No  . Sexual Activity: Not on file   Other Topics Concern  . Not on file   Social History Narrative  . No narrative on file     Review of Systems: General: negative for chills, fever, night sweats or weight changes.  Cardiovascular: negative for chest pain, dyspnea on exertion, edema, orthopnea, palpitations, paroxysmal nocturnal dyspnea or shortness of breath Dermatological: negative for rash Respiratory: negative for cough or wheezing Urologic: negative for hematuria Abdominal: negative for nausea, vomiting, diarrhea, bright red blood per rectum, melena, or hematemesis Neurologic: negative for visual changes, syncope, or dizziness All  other systems reviewed and are otherwise negative except as noted above.    Blood pressure 130/86, pulse 70, height 5\' 9"  (1.753 m), weight 146 lb (66.225 kg).  General appearance: alert and no distress Neck: no adenopathy, no carotid bruit, no JVD, supple, symmetrical, trachea midline and thyroid not enlarged, symmetric, no tenderness/mass/nodules Lungs: clear to auscultation bilaterally Heart: regular rate and rhythm, S1, S2 normal, no murmur, click, rub or gallop Extremities: extremities normal, atraumatic, no cyanosis or edema  EKG normal sinus rhythm at 70 without ST or T  wave changes  ASSESSMENT AND PLAN:   Essential hypertension Controlled on current medications  Hyperlipidemia On statin therapy. We will recheck a lipid and liver profile  Peripheral arterial disease History of angiography which I performed 07/24/2011. He had a moderate right external iliac artery stenosis in the 60% range, diffuse 50% SFA disease bilaterally with 2 vessel runoff on the right and one vessel on the left. I did also images mesenteric artery which showed patent stents with 50% in-stent restenosis and. He does complain of some mild right calf claudication which has not changed in severity over the past year. I am going to recheck a lower extremity arterial Doppler study.      Lorretta Harp MD FACP,FACC,FAHA, [facility] 11/01/2013 9:47 AM

## 2013-11-01 NOTE — Patient Instructions (Signed)
  We will see you back in follow up in 1 year with Dr Gwenlyn Found  Dr Gwenlyn Found has ordered: lower extremity arterial doppler- During this test, ultrasound is used to evaluate arterial blood flow in the legs. Allow approximately one hour for this exam.   Please have blood work done at your convenience, fasting.

## 2013-11-01 NOTE — Assessment & Plan Note (Signed)
Controlled on current medications 

## 2013-11-02 LAB — HEPATIC FUNCTION PANEL
ALT: 13 U/L (ref 0–53)
AST: 20 U/L (ref 0–37)
Albumin: 4.2 g/dL (ref 3.5–5.2)
Alkaline Phosphatase: 126 U/L — ABNORMAL HIGH (ref 39–117)
Bilirubin, Direct: 0.1 mg/dL (ref 0.0–0.3)
Indirect Bilirubin: 0.3 mg/dL (ref 0.2–1.2)
Total Bilirubin: 0.4 mg/dL (ref 0.2–1.2)
Total Protein: 7 g/dL (ref 6.0–8.3)

## 2013-11-02 LAB — LIPID PANEL
Cholesterol: 158 mg/dL (ref 0–200)
HDL: 61 mg/dL (ref >39)
LDL Cholesterol: 79 mg/dL (ref 0–99)
Total CHOL/HDL Ratio: 2.6 Ratio
Triglycerides: 91 mg/dL (ref <150)
VLDL: 18 mg/dL (ref 0–40)

## 2013-11-03 ENCOUNTER — Telehealth: Payer: Self-pay | Admitting: *Deleted

## 2013-11-03 ENCOUNTER — Encounter: Payer: Self-pay | Admitting: *Deleted

## 2013-11-03 DIAGNOSIS — R748 Abnormal levels of other serum enzymes: Secondary | ICD-10-CM

## 2013-11-03 NOTE — Telephone Encounter (Signed)
Message copied by Chauncy Lean on Thu Nov 03, 2013  3:05 PM ------      Message from: Lorretta Harp      Created: Wed Nov 02, 2013  9:47 AM       Vascular profile and liver function tests are okay from mildly elevated alkaline phosphatase. Recheck LFTs in 6-8 weeks ------

## 2013-11-03 NOTE — Telephone Encounter (Signed)
Lab slip mailed to recheck blood work in 6-8 weeks.

## 2013-11-09 ENCOUNTER — Ambulatory Visit (HOSPITAL_COMMUNITY)
Admission: RE | Admit: 2013-11-09 | Discharge: 2013-11-09 | Disposition: A | Discharge status: Home / Self Care | Payer: Medicare Other | Source: Ambulatory Visit | Attending: Cardiology | Admitting: Cardiology

## 2013-11-09 DIAGNOSIS — I739 Peripheral vascular disease, unspecified: Secondary | ICD-10-CM | POA: Insufficient documentation

## 2013-11-09 NOTE — Progress Notes (Signed)
Lower Extremity Arterial Duplex Completed. °Brianna L Mazza,RVT °

## 2013-11-14 ENCOUNTER — Encounter: Payer: Self-pay | Admitting: *Deleted

## 2013-12-10 ENCOUNTER — Other Ambulatory Visit: Payer: Self-pay | Admitting: Cardiovascular Disease

## 2013-12-12 NOTE — Telephone Encounter (Signed)
Rx was sent to pharmacy electronically. 

## 2014-01-02 ENCOUNTER — Other Ambulatory Visit: Payer: Self-pay | Admitting: Cardiovascular Disease

## 2014-01-02 NOTE — Telephone Encounter (Signed)
Simvastatin refilled #90 tablets with 3 refills on 12/12/2013

## 2014-01-03 ENCOUNTER — Telehealth: Payer: Self-pay | Admitting: Cardiovascular Disease

## 2014-01-03 MED ORDER — SIMVASTATIN 20 MG PO TABS: ORAL_TABLET | ORAL | Status: DC

## 2014-01-03 NOTE — Telephone Encounter (Signed)
Pt need a new prescription for his Simvastatin 20 mg #90 and refills. Please call to [phone],he have been out of this medicine for about a month.

## 2014-01-03 NOTE — Telephone Encounter (Signed)
Spoke with patient who states he has been out of simvastatin for 1 month. Notified him that med was refilled on 10/12 #90 with 3 refills. Verbal refill order for medication  Spoke with pharmacy staff, who stated the last date of Rx pick up was in April.

## 2014-01-19 ENCOUNTER — Ambulatory Visit (INDEPENDENT_AMBULATORY_CARE_PROVIDER_SITE_OTHER): Payer: Medicare Other | Admitting: Cardiology

## 2014-01-19 ENCOUNTER — Encounter: Payer: Self-pay | Admitting: Cardiology

## 2014-01-19 VITALS — BP 162/70 | HR 96 | Ht 69.0 in | Wt 145.7 lb

## 2014-01-19 DIAGNOSIS — E785 Hyperlipidemia, unspecified: Secondary | ICD-10-CM

## 2014-01-19 DIAGNOSIS — I10 Essential (primary) hypertension: Secondary | ICD-10-CM

## 2014-01-19 DIAGNOSIS — W1809XA Striking against other object with subsequent fall, initial encounter: Secondary | ICD-10-CM

## 2014-01-19 NOTE — Progress Notes (Signed)
01/19/2014 Shawn Fernandez   November 30, 1945  [id number]  Primary Physician [name], MD Primary Cardiologist: Dr. Gwenlyn Found  HPI:  The patient is a 68 year old African-American male, followed by Dr. Gwenlyn Found, with a history of treated hypertension, hyperlipidemia, prior history of tobacco abuse, h/o mesenteric ischemia status post stenting at [facility] as well as peripheral vascular disease, followed by Dr. Gwenlyn Found, status post prior SMA and SFA interventions. He also has carotid artery disease that is followed yearly by carotid Doppler studies. His last assessment was in May of this year revealing 50-60% bilateral diameter reduction. This was essentially unchanged from his prior study. His last office visit with Dr. Gwenlyn Found was in September of this year. At that time, he was felt to be stable from a cardiovascular standpoint and he was instructed by Dr. Gwenlyn Found to return in one year for repeat evaluation.  He presents to clinic today for a checkup. His wife was concerned about his well-being after an episode that occurred at his home several days ago. He states they were both in the kitchen when he lost his footing and stumbled backwards. He did not fall or injure himself. However, his wife was concerned and encouraged him to make an appointment. He feels that this was strictly a mechanical issue. He denies syncope/near-syncope. No history of palpitations, dyspnea or chest pain. He feels that he is in pretty good health. He exercises regularly at the [facility] and walks on a daily basis. He denies any limitations or symptoms with physical activity.   Current Outpatient Prescriptions  Medication Sig Dispense Refill  . amLODipine (NORVASC) 10 MG tablet Take 10 mg by mouth every morning.     Marland Kitchen aspirin EC 81 MG tablet Take 81 mg by mouth daily.    . cilostazol (PLETAL) 50 MG tablet Take 50 mg by mouth every morning.    Marland Kitchen losartan (COZAAR) 100 MG tablet Take 100 mg by mouth every morning.     . Multiple  Vitamin (MULTIVITAMIN WITH MINERALS) TABS tablet Take 1 tablet by mouth daily.    Marland Kitchen omeprazole (PRILOSEC) 20 MG capsule Take 20 mg by mouth daily.    . simvastatin (ZOCOR) 20 MG tablet TAKE 1 TABLET (20 MG TOTAL) BY MOUTH EVERY EVENING. 90 tablet 3   No current facility-administered medications for this visit.    Allergies  Allergen Reactions  . Lisinopril Cough    History   Social History  . Marital Status: Married    Spouse Name: N/A    Number of Children: N/A  . Years of Education: N/A   Occupational History  . Not on file.   Social History Main Topics  . Smoking status: Former Smoker -- 0.75 packs/day for 20 years    Types: Cigarettes    Quit date: 07/01/2009  . Smokeless tobacco: Never Used  . Alcohol Use: Yes     Comment: only occ  . Drug Use: No  . Sexual Activity: Not on file   Other Topics Concern  . Not on file   Social History Narrative  . No narrative on file     Review of Systems: General: negative for chills, fever, night sweats or weight changes.  Cardiovascular: negative for chest pain, dyspnea on exertion, edema, orthopnea, palpitations, paroxysmal nocturnal dyspnea or shortness of breath Dermatological: negative for rash Respiratory: negative for cough or wheezing Urologic: negative for hematuria Abdominal: negative for nausea, vomiting, diarrhea, bright red blood per rectum, melena, or hematemesis Neurologic: negative for visual changes,  syncope, or dizziness All other systems reviewed and are otherwise negative except as noted above.    Blood pressure 162/70, pulse 96, height 5\' 9"  (1.753 m), weight 145 lb 11.2 oz (66.089 kg).  General appearance: alert, cooperative and no distress Neck: no carotid bruit and no JVD Lungs: clear to auscultation bilaterally Heart: regular rate and rhythm, S1, S2 normal, no murmur, click, rub or gallop Extremities: no LEE Pulses: 2+ and symmetric Skin: warm and dry Neurologic: Grossly normal  EKG not  performed  ASSESSMENT AND PLAN:   Based on the patient's reports, it sounds as if his recent event that occurred at his home was likely subsequent to mechanical issues. He denies any symptoms, such as syncope/near-syncope, palpitations, dizziness, lightheadedness, dyspnea or chest pain that would be concerning of cardiovascular etiologies. He is physically active, engaging in routine physical activity and denies any limitations, dyspnea or anginal like symptoms with these activities. He is on the appropriate medications for his hypertension, hyperlipidemia and peripheral vascular disease. His physical exam is unremarkable. LEAs and carotid Doppler studies are followed yearly by Dr. Gwenlyn Found and were stable at time of his last assessment. I feel that no further cardiovascular workup is indicated at that this time. I recommended that he continue his current medication regimen and continue routine follow-up with Dr. Gwenlyn Found on a yearly basis. He will be due back to see Dr. Gwenlyn Found in May 2016.  PLAN  F/U with Dr. Gwenlyn Found in 07/2014  [name], [facility] 01/19/2014 8:59 AM

## 2014-01-19 NOTE — Patient Instructions (Signed)
Your physician recommends that you schedule a follow-up appointment in: with Dr Gwenlyn Found in 1 year

## 2014-02-09 ENCOUNTER — Encounter (HOSPITAL_COMMUNITY): Payer: Self-pay | Admitting: Cardiovascular Disease

## 2014-05-01 ENCOUNTER — Other Ambulatory Visit (HOSPITAL_COMMUNITY): Payer: Self-pay | Admitting: Cardiovascular Disease

## 2014-05-01 DIAGNOSIS — I739 Peripheral vascular disease, unspecified: Secondary | ICD-10-CM

## 2014-05-10 ENCOUNTER — Ambulatory Visit (HOSPITAL_COMMUNITY)
Admission: RE | Admit: 2014-05-10 | Discharge: 2014-05-10 | Disposition: A | Discharge status: Home / Self Care | Payer: Medicare Other | Source: Ambulatory Visit | Attending: Internal Medicine | Admitting: Internal Medicine

## 2014-05-10 DIAGNOSIS — I739 Peripheral vascular disease, unspecified: Secondary | ICD-10-CM | POA: Diagnosis present

## 2014-05-10 DIAGNOSIS — I70203 Unspecified atherosclerosis of native arteries of extremities, bilateral legs: Secondary | ICD-10-CM | POA: Insufficient documentation

## 2014-05-10 NOTE — Progress Notes (Signed)
Lower Extremity Arterial Duplex Completed. °Brianna L Mazza,RVT °

## 2014-06-06 ENCOUNTER — Other Ambulatory Visit: Payer: Self-pay | Admitting: *Deleted

## 2014-06-06 MED ORDER — SIMVASTATIN 20 MG PO TABS: ORAL_TABLET | ORAL | Status: DC

## 2014-06-06 NOTE — Telephone Encounter (Signed)
Walk-in refill request handled, submitted to pharmacy. Called pt to notify. Pt voiced understanding.

## 2014-06-28 ENCOUNTER — Telehealth: Payer: Self-pay | Admitting: Cardiology

## 2014-06-28 NOTE — Telephone Encounter (Signed)
Spoke with patient regarding his appointment 06/29/14.  Explained the appointment had been moved to our [employer] location @ 11:00 am.  I gave the patient the address [phone]--he voiced his understanding.

## 2014-06-29 ENCOUNTER — Encounter: Payer: Self-pay | Admitting: Cardiology

## 2014-06-29 ENCOUNTER — Ambulatory Visit: Payer: Medicare Other | Admitting: Cardiology

## 2014-06-29 ENCOUNTER — Ambulatory Visit (INDEPENDENT_AMBULATORY_CARE_PROVIDER_SITE_OTHER): Payer: Medicare Other | Admitting: Cardiology

## 2014-06-29 VITALS — BP 122/90 | HR 114 | Ht 69.0 in | Wt 138.4 lb

## 2014-06-29 DIAGNOSIS — M791 Myalgia, unspecified site: Secondary | ICD-10-CM | POA: Insufficient documentation

## 2014-06-29 DIAGNOSIS — I10 Essential (primary) hypertension: Secondary | ICD-10-CM | POA: Diagnosis not present

## 2014-06-29 DIAGNOSIS — R000 Tachycardia, unspecified: Secondary | ICD-10-CM | POA: Diagnosis not present

## 2014-06-29 DIAGNOSIS — I739 Peripheral vascular disease, unspecified: Secondary | ICD-10-CM | POA: Diagnosis not present

## 2014-06-29 LAB — COMPREHENSIVE METABOLIC PANEL
ALT: 16 U/L (ref 0–53)
AST: 25 U/L (ref 0–37)
Albumin: 4 g/dL (ref 3.5–5.2)
Alkaline Phosphatase: 144 U/L — ABNORMAL HIGH (ref 39–117)
BUN: 8 mg/dL (ref 6–23)
CO2: 27 mEq/L (ref 19–32)
Calcium: 9.9 mg/dL (ref 8.4–10.5)
Chloride: 106 mEq/L (ref 96–112)
Creatinine, Ser: 0.73 mg/dL (ref 0.40–1.50)
GFR: 137.02 mL/min (ref >60.00)
Glucose, Bld: 97 mg/dL (ref 70–99)
Potassium: 3.7 mEq/L (ref 3.5–5.1)
Sodium: 139 mEq/L (ref 135–145)
Total Bilirubin: 0.9 mg/dL (ref 0.2–1.2)
Total Protein: 7.3 g/dL (ref 6.0–8.3)

## 2014-06-29 LAB — CBC
HCT: 37.3 % — ABNORMAL LOW (ref 39.0–52.0)
Hemoglobin: 12.1 g/dL — ABNORMAL LOW (ref 13.0–17.0)
MCHC: 32.5 g/dL (ref 30.0–36.0)
MCV: 78.5 fl (ref 78.0–100.0)
Platelets: 233 10*3/uL (ref 150.0–400.0)
RBC: 4.76 Mil/uL (ref 4.22–5.81)
RDW: 14.7 % (ref 11.5–15.5)
WBC: 8 10*3/uL (ref 4.0–10.5)

## 2014-06-29 LAB — TSH: TSH: 0.07 u[IU]/mL — ABNORMAL LOW (ref 0.35–4.50)

## 2014-06-29 MED ORDER — AMLODIPINE BESYLATE 10 MG PO TABS: 10.0000 mg | ORAL_TABLET | Freq: Every morning | ORAL | Status: DC

## 2014-06-29 NOTE — Assessment & Plan Note (Signed)
His family says he has been having LE claudication for a year.

## 2014-06-29 NOTE — Progress Notes (Signed)
06/29/2014 Shawn Fernandez   Sep 20, 1945  [id number]  Primary Physician [name], MD Primary Cardiologist: Dr Gwenlyn Found  HPI:  69 year old African-American male, followed by Dr. Gwenlyn Found, with a history of treated hypertension, hyperlipidemia, prior history of tobacco abuse, h/o mesenteric ischemia status post stenting at [facility] as well as LE peripheral vascular disease, followed by Dr. Gwenlyn Found. He also has carotid artery disease that is followed yearly by carotid Doppler studies. His last assessment was in May of this year revealing 50-60% bilateral diameter reduction. This was essentially unchanged from his prior study. LE angiogram Jul 24, 2011 revealed patent stents to his SMA with 50% "in-stent restenosis." He had 60% stenosis in his right external iliac artery without a pullback gradient and diffuse SFA disease in the 50% range, as well as tibial disease with 2-vessel runoff on the right and 1 on the right. He has consistently denied claudication. Dopplers done in March 2016 showed ABI's of 0.57 and 0.88. He is in the office today with his family who says he has actually been having symptoms for a year. Previously he would come alone for OV.  She told me outright "he has been lying". He also ran out of his HTN medication. He now describes Lt. Rt calf claudication.    Current Outpatient Prescriptions  Medication Sig Dispense Refill  . aspirin EC 81 MG tablet Take 81 mg by mouth daily.    . cilostazol (PLETAL) 50 MG tablet Take 50 mg by mouth every morning.    . clopidogrel (PLAVIX) 75 MG tablet Take 75 mg by mouth daily.  5  . losartan (COZAAR) 100 MG tablet Take 100 mg by mouth every morning.     . Multiple Vitamin (MULTIVITAMIN WITH MINERALS) TABS tablet Take 1 tablet by mouth daily.    Marland Kitchen omeprazole (PRILOSEC) 20 MG capsule Take 20 mg by mouth daily.    . simvastatin (ZOCOR) 20 MG tablet TAKE 1 TABLET (20 MG TOTAL) BY MOUTH EVERY EVENING. 90 tablet 1   No current  facility-administered medications for this visit.    Allergies  Allergen Reactions  . Lisinopril Cough    History   Social History  . Marital Status: Married    Spouse Name: N/A  . Number of Children: N/A  . Years of Education: N/A   Occupational History  . Not on file.   Social History Main Topics  . Smoking status: Former Smoker -- 0.75 packs/day for 20 years    Types: Cigarettes    Quit date: 07/01/2009  . Smokeless tobacco: Never Used  . Alcohol Use: Yes     Comment: only occ  . Drug Use: No  . Sexual Activity: Not on file   Other Topics Concern  . Not on file   Social History Narrative     Review of Systems: General: negative for chills, fever, night sweats or weight changes.  Cardiovascular: negative for chest pain, dyspnea on exertion, edema, orthopnea, palpitations, paroxysmal nocturnal dyspnea or shortness of breath Dermatological: negative for rash Respiratory: negative for cough or wheezing Urologic: negative for hematuria Abdominal: negative for nausea, vomiting, diarrhea, bright red blood per rectum, melena, or hematemesis Neurologic: negative for visual changes, syncope, or dizziness All other systems reviewed and are otherwise negative except as noted above.    Blood pressure 122/90, pulse 114, height 5\' 9"  (1.753 m), weight 138 lb 6.4 oz (62.778 kg), SpO2 93 %.  General appearance: alert, cooperative, no distress and thin Neck: no JVD and  Rt CA bruit Lungs: clear to auscultation bilaterally Heart: regular rate and rhythm and tachycardia Abdomen: soft, non-tender; bowel sounds normal; no masses,  no organomegaly Extremities: no edema Pulses: diminnished distal pulses Skin: Skin color, texture, turgor normal. No rashes or lesions Neurologic: Grossly normal  EKG NST, ST  ASSESSMENT AND PLAN:   Claudication His family says he has been having LE claudication for a year.   Essential hypertension "Out of my medicines"   Peripheral  arterial disease History of mesenteric artery stents at [facility] and known LE disease by prior angiogram and doppler. We had though this was asymptomatic but the pt's family says he is having symptoms.     PLAN  Resume B/P medications. I'll check labs today including a TSH. He'll need a f/u in two weeks with an APP on a day Dr Gwenlyn Found is in the office, his wife will accompany him again to that visit. Consider PVA.    Kaya Pottenger KPA-C 06/29/2014 11:52 AM

## 2014-06-29 NOTE — Patient Instructions (Addendum)
Medication Instructions:   MAKE SURE YOU TAKE ALL YOUR CARDIAC MEDICINES AT THE RIGHT DOSE AND TIME   Labwork: CMET CBC TSH   Testing/Procedures: NONE TODAY   Follow-Up:  IN 2 TO 3 WEEKS WEEK OF MAY  9TH  WITH EXTENDER AT [facility] THE SAME DAY DR BERRY IN OFFICE PER LUKE KILROY   Any Other Special Instructions Will Be Listed Below (If Applicable).

## 2014-06-29 NOTE — Assessment & Plan Note (Signed)
"  Out of my medicines"

## 2014-06-29 NOTE — Assessment & Plan Note (Signed)
History of mesenteric artery stents at [facility] and known LE disease by prior angiogram and doppler. We had though this was asymptomatic but the pt's family says he is having symptoms.

## 2014-06-30 ENCOUNTER — Other Ambulatory Visit: Payer: Self-pay | Admitting: *Deleted

## 2014-06-30 DIAGNOSIS — R7989 Other specified abnormal findings of blood chemistry: Secondary | ICD-10-CM

## 2014-07-17 ENCOUNTER — Telehealth: Payer: Self-pay | Admitting: Cardiovascular Disease

## 2014-07-17 NOTE — Telephone Encounter (Signed)
Incoming call from Shawn Fernandez - question about labs - advised draw based on current orders. She voiced understanding.

## 2014-07-18 LAB — T4: T4, Total: 11.9 ug/dL (ref 4.5–12.0)

## 2014-07-18 LAB — TSH: TSH: <0.008 u[IU]/mL — ABNORMAL LOW (ref 0.350–4.500)

## 2014-07-19 ENCOUNTER — Encounter: Payer: Self-pay | Admitting: Cardiovascular Disease

## 2014-07-19 ENCOUNTER — Ambulatory Visit (INDEPENDENT_AMBULATORY_CARE_PROVIDER_SITE_OTHER): Payer: Medicare Other | Admitting: Cardiovascular Disease

## 2014-07-19 VITALS — BP 150/60 | HR 116 | Ht 69.0 in | Wt 140.0 lb

## 2014-07-19 DIAGNOSIS — E785 Hyperlipidemia, unspecified: Secondary | ICD-10-CM | POA: Diagnosis not present

## 2014-07-19 DIAGNOSIS — I739 Peripheral vascular disease, unspecified: Secondary | ICD-10-CM

## 2014-07-19 DIAGNOSIS — I779 Disorder of arteries and arterioles, unspecified: Secondary | ICD-10-CM | POA: Diagnosis not present

## 2014-07-19 DIAGNOSIS — I10 Essential (primary) hypertension: Secondary | ICD-10-CM | POA: Diagnosis not present

## 2014-07-19 NOTE — Assessment & Plan Note (Signed)
History of peripheral arterial disease status post angiography by myself performed 07/24/11 revealing moderate SFA disease bilaterally with two-vessel runoff on the right and one vessel on the left. He had occluded anterior posterior tibial with a diffuse 60% peroneal stenosis. He does complain of left calf claudication. There is no evidence of critical limb ischemia. He is on Pletal. I suggested continued exercise. I do not think there is an indication for tibial intervention for claudication.

## 2014-07-19 NOTE — Progress Notes (Signed)
07/19/2014 Shawn Fernandez   1946/02/03  [id number]  Primary Physician [name], MD Primary Cardiologist: Lorretta Harp MD Renae Gloss   HPI:   The patient is a 69 year old thin appearing married African American male father of 61, grandfather to 2 grandchildren accompanied by his wife today. I last saw him 11/01/13. He is a retired Administrator, originally referred to me by Dr. Karlton Lemon for peripheral vascular evaluation and claudication. His risk factors include hypertension, hyperlipidemia, and off and on tobacco abuse as well as family history. 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 He also complained of right greater than left lower extremity claudication at less than 1 block. He had Dopplers in our office which showed ABIs in the 0.7 range bilaterally. I angiogrammed him Jul 24, 2011 revealing patent stents to his SMA with 50% "in-stent restenosis." He had 60% stenosis in his right external iliac artery without a pullback gradient and diffuse SFA disease in the 50% range, as well as tibial disease with 2-vessel runoff on the right and 1 on the right. He is on Pletal. He currently denies claudication. There were no focal lesions which I could identify as "culprit" lesions warranting percutaneous intervention. hhe did stop smoking approximately one year ago. He denies right leg; claudication but does complain of left calf claudication. He is on Pletal. He has 1 vessel runoff below the knee on the left peroneal which has 50-60% proximal stenosis.  Current Outpatient Prescriptions  Medication Sig Dispense Refill  . amLODipine (NORVASC) 10 MG tablet Take 1 tablet (10 mg total) by mouth every morning. 30 tablet 6  . aspirin EC 81 MG tablet Take 81 mg by mouth daily.    . cilostazol (PLETAL) 50 MG tablet Take 50 mg by mouth every morning.    . clopidogrel (PLAVIX) 75 MG tablet Take 75 mg by mouth daily.  5  . losartan  (COZAAR) 100 MG tablet Take 100 mg by mouth every morning.     . Multiple Vitamin (MULTIVITAMIN WITH MINERALS) TABS tablet Take 1 tablet by mouth daily.    Marland Kitchen omeprazole (PRILOSEC) 20 MG capsule Take 20 mg by mouth daily.    . simvastatin (ZOCOR) 20 MG tablet TAKE 1 TABLET (20 MG TOTAL) BY MOUTH EVERY EVENING. 90 tablet 1   No current facility-administered medications for this visit.    Allergies  Allergen Reactions  . Lisinopril Cough    History   Social History  . Marital Status: Married    Spouse Name: N/A  . Number of Children: N/A  . Years of Education: N/A   Occupational History  . Not on file.   Social History Main Topics  . Smoking status: Former Smoker -- 0.75 packs/day for 20 years    Types: Cigarettes    Quit date: 07/01/2009  . Smokeless tobacco: Never Used  . Alcohol Use: Yes     Comment: only occ  . Drug Use: No  . Sexual Activity: Not on file   Other Topics Concern  . Not on file   Social History Narrative     Review of Systems: General: negative for chills, fever, night sweats or weight changes.  Cardiovascular: negative for chest pain, dyspnea on exertion, edema, orthopnea, palpitations, paroxysmal nocturnal dyspnea or shortness of breath Dermatological: negative for rash Respiratory: negative for cough or wheezing Urologic: negative for hematuria Abdominal: negative for nausea, vomiting, diarrhea, bright red blood per rectum, melena, or  hematemesis Neurologic: negative for visual changes, syncope, or dizziness All other systems reviewed and are otherwise negative except as noted above.    Blood pressure 150/60, pulse 116, height 5\' 9"  (1.753 m), weight 140 lb (63.504 kg).  General appearance: alert and no distress Neck: no adenopathy, no JVD, supple, symmetrical, trachea midline, thyroid not enlarged, symmetric, no tenderness/mass/nodules and soft bilateral carotid bruits Lungs: clear to auscultation bilaterally Heart: regular rate and rhythm,  S1, S2 normal, no murmur, click, rub or gallop Extremities: extremities normal, atraumatic, no cyanosis or edema  EKG not performed today  ASSESSMENT AND PLAN:   Peripheral arterial disease History of peripheral arterial disease status post angiography by myself performed 07/24/11 revealing moderate SFA disease bilaterally with two-vessel runoff on the right and one vessel on the left. He had occluded anterior posterior tibial with a diffuse 60% peroneal stenosis. He does complain of left calf claudication. There is no evidence of critical limb ischemia. He is on Pletal. I suggested continued exercise. I do not think there is an indication for tibial intervention for claudication.   Hyperlipidemia History of hyperlipidemia AND was on 20 mg a day with recent lipid profile performed 11/01/13 revealed total cholesterol 158, LDL 79 HDL of 61   Essential hypertension History of hypertension blood pressure measured at 150/60. He is on amlodipine and losartan. Continue current meds at current dosing       Lorretta Harp MD [facility], [facility] 07/19/2014 3:44 PM

## 2014-07-19 NOTE — Assessment & Plan Note (Signed)
History of hypertension blood pressure measured at 150/60. He is on amlodipine and losartan. Continue current meds at current dosing

## 2014-07-19 NOTE — Patient Instructions (Signed)
Dr Gwenlyn Found has requested that you have a carotid duplex. This test is an ultrasound of the carotid arteries in your neck. It looks at blood flow through these arteries that supply the brain with blood. Allow one hour for this exam. There are no restrictions or special instructions.  Your physician recommends that you schedule a follow-up appointment in 6 months with an extender - Kerin Ransom, PA-C.  Dr Gwenlyn Found wants you to follow-up in 1 year. You will receive a reminder letter in the mail two months in advance. If you don't receive a letter, please call our office to schedule the follow-up appointment.

## 2014-07-19 NOTE — Assessment & Plan Note (Signed)
History of hyperlipidemia AND was on 20 mg a day with recent lipid profile performed 11/01/13 revealed total cholesterol 158, LDL 79 HDL of 61

## 2014-08-03 ENCOUNTER — Ambulatory Visit (HOSPITAL_COMMUNITY)
Admission: RE | Admit: 2014-08-03 | Discharge: 2014-08-03 | Disposition: A | Discharge status: Home / Self Care | Payer: Medicare Other | Source: Ambulatory Visit | Attending: Cardiovascular Disease | Admitting: Cardiovascular Disease

## 2014-08-03 DIAGNOSIS — I779 Disorder of arteries and arterioles, unspecified: Secondary | ICD-10-CM

## 2014-08-03 DIAGNOSIS — I739 Peripheral vascular disease, unspecified: Secondary | ICD-10-CM

## 2014-08-10 ENCOUNTER — Other Ambulatory Visit (HOSPITAL_COMMUNITY): Payer: Self-pay | Admitting: Endocrinology

## 2014-08-10 DIAGNOSIS — E0590 Thyrotoxicosis, unspecified without thyrotoxic crisis or storm: Secondary | ICD-10-CM

## 2014-08-22 ENCOUNTER — Encounter (HOSPITAL_COMMUNITY)
Admission: RE | Admit: 2014-08-22 | Discharge: 2014-08-22 | Disposition: A | Discharge status: Home / Self Care | Payer: Medicare Other | Source: Ambulatory Visit | Attending: Endocrinology | Admitting: Endocrinology

## 2014-08-22 DIAGNOSIS — E0590 Thyrotoxicosis, unspecified without thyrotoxic crisis or storm: Secondary | ICD-10-CM | POA: Insufficient documentation

## 2014-08-23 ENCOUNTER — Encounter (HOSPITAL_COMMUNITY)
Admission: RE | Admit: 2014-08-23 | Discharge: 2014-08-23 | Disposition: A | Discharge status: Home / Self Care | Payer: Medicare Other | Source: Ambulatory Visit | Attending: Endocrinology | Admitting: Endocrinology

## 2014-08-23 MED ORDER — SODIUM PERTECHNETATE TC 99M INJECTION
9.8000 | Freq: Once | INTRAVENOUS | Status: AC | PRN
  Administered 2014-08-23: 10 via INTRAVENOUS

## 2014-08-23 MED ORDER — SODIUM IODIDE I 131 CAPSULE: 13.0000 | Freq: Once | INTRAVENOUS | Status: AC | PRN

## 2015-01-04 ENCOUNTER — Encounter: Payer: Self-pay | Admitting: Cardiology

## 2015-01-04 ENCOUNTER — Ambulatory Visit (INDEPENDENT_AMBULATORY_CARE_PROVIDER_SITE_OTHER): Payer: Medicare Other | Admitting: Cardiology

## 2015-01-04 VITALS — BP 146/74 | HR 94 | Ht 69.0 in | Wt 146.8 lb

## 2015-01-04 DIAGNOSIS — E0590 Thyrotoxicosis, unspecified without thyrotoxic crisis or storm: Secondary | ICD-10-CM | POA: Diagnosis not present

## 2015-01-04 DIAGNOSIS — Z79899 Other long term (current) drug therapy: Secondary | ICD-10-CM | POA: Diagnosis not present

## 2015-01-04 DIAGNOSIS — E785 Hyperlipidemia, unspecified: Secondary | ICD-10-CM | POA: Diagnosis not present

## 2015-01-04 DIAGNOSIS — I10 Essential (primary) hypertension: Secondary | ICD-10-CM | POA: Diagnosis not present

## 2015-01-04 DIAGNOSIS — I739 Peripheral vascular disease, unspecified: Secondary | ICD-10-CM

## 2015-01-04 NOTE — Assessment & Plan Note (Signed)
Picked up in April 2016- now followed by an endocrinologist

## 2015-01-04 NOTE — Assessment & Plan Note (Signed)
He denies claudication

## 2015-01-04 NOTE — Assessment & Plan Note (Signed)
Controlled.  

## 2015-01-04 NOTE — Progress Notes (Signed)
01/04/2015 Shawn Fernandez   08-02-1945  [id number]  Primary Physician [name], MD Primary Cardiologist: Dr Gwenlyn Found  HPI:  69 y/o AA male followed by Dr Gwenlyn Found for PVD and HTN. He does not have claudication. When we saw him in April 2016 he denied complaints but his family suggested something was wrong. He was tachycardic and hypertensive. A TSH was drawn and he was in fact hyperthyroid. He was referred to an endocrinologist and has been treated. He tells me he feels much better. He is gaining wgt, and less tachycardiac. He continues to denies claudication. He denies chest pain or dyspnea.    Current Outpatient Prescriptions  Medication Sig Dispense Refill  . amLODipine (NORVASC) 10 MG tablet Take 1 tablet (10 mg total) by mouth every morning. 30 tablet 6  . aspirin EC 81 MG tablet Take 81 mg by mouth daily.    . cilostazol (PLETAL) 50 MG tablet Take 50 mg by mouth every morning.    . clopidogrel (PLAVIX) 75 MG tablet Take 75 mg by mouth daily.  5  . losartan (COZAAR) 100 MG tablet Take 100 mg by mouth every morning.     . methimazole (TAPAZOLE) 10 MG tablet Take 10 mg by mouth 2 (two) times daily with a meal.  6  . Multiple Vitamin (MULTIVITAMIN WITH MINERALS) TABS tablet Take 1 tablet by mouth daily.    Marland Kitchen omeprazole (PRILOSEC) 20 MG capsule Take 20 mg by mouth daily.    . simvastatin (ZOCOR) 20 MG tablet TAKE 1 TABLET (20 MG TOTAL) BY MOUTH EVERY EVENING. 90 tablet 1   No current facility-administered medications for this visit.    Allergies  Allergen Reactions  . Lisinopril Cough    Social History   Social History  . Marital Status: Married    Spouse Name: N/A  . Number of Children: N/A  . Years of Education: N/A   Occupational History  . Not on file.   Social History Main Topics  . Smoking status: Former Smoker -- 0.75 packs/day for 20 years    Types: Cigarettes    Quit date: 07/01/2009  . Smokeless tobacco: Never Used  . Alcohol Use: Yes     Comment: only  occ  . Drug Use: No  . Sexual Activity: Not on file   Other Topics Concern  . Not on file   Social History Narrative     Review of Systems: General: negative for chills, fever, night sweats or weight changes.  Cardiovascular: negative for chest pain, dyspnea on exertion, edema, orthopnea, palpitations, paroxysmal nocturnal dyspnea or shortness of breath Dermatological: negative for rash Respiratory: negative for cough or wheezing Urologic: negative for hematuria Abdominal: negative for nausea, vomiting, diarrhea, bright red blood per rectum, melena, or hematemesis Neurologic: negative for visual changes, syncope, or dizziness All other systems reviewed and are otherwise negative except as noted above.    Blood pressure 146/74, pulse 94, height 5\' 9"  (1.753 m), weight 146 lb 12.8 oz (66.588 kg).  General appearance: alert, cooperative and no distress Neck: no carotid bruit and no JVD Lungs: clear to auscultation bilaterally Heart: regular rate and rhythm Extremities: no edema Pulses: diminnished LE pulses Skin: Skin color, texture, turgor normal. No rashes or lesions Neurologic: Grossly normal  EKG NSR  ASSESSMENT AND PLAN:   Essential hypertension Controlled  Peripheral arterial disease He denies claudication  Hyperlipidemia Due for lipids and LFTs  Hyperthyroidism Picked up in April 2016- now followed by an endocrinologist  PLAN  Same cardiac Rx. Check CMET and fasting lipids. F/U with Dr Gwenlyn Found in 6 months. He knows to contact us if he has claudication or any non healing LE wounds.   Kerin Ransom K PA-C 01/04/2015 10:41 AM

## 2015-01-04 NOTE — Assessment & Plan Note (Signed)
Due for lipids and LFTs

## 2015-01-04 NOTE — Patient Instructions (Addendum)
Your physician recommends that you return for lab work in: [place] [facility]   Your physician recommends that you schedule a follow-up appointment in: 6 months with Dr. Gwenlyn Found

## 2015-01-10 LAB — COMPREHENSIVE METABOLIC PANEL
ALT: 11 U/L (ref 9–46)
AST: 29 U/L (ref 10–35)
Albumin: 4.1 g/dL (ref 3.6–5.1)
Alkaline Phosphatase: 154 U/L — ABNORMAL HIGH (ref 40–115)
BUN: 10 mg/dL (ref 7–25)
CO2: 27 mmol/L (ref 20–31)
Calcium: 9 mg/dL (ref 8.6–10.3)
Chloride: 107 mmol/L (ref 98–110)
Creat: 0.97 mg/dL (ref 0.70–1.25)
Glucose, Bld: 96 mg/dL (ref 65–99)
Potassium: 3.6 mmol/L (ref 3.5–5.3)
Sodium: 141 mmol/L (ref 135–146)
Total Bilirubin: 0.7 mg/dL (ref 0.2–1.2)
Total Protein: 7.2 g/dL (ref 6.1–8.1)

## 2015-01-10 LAB — LIPID PANEL
Cholesterol: 154 mg/dL (ref 125–200)
HDL: 91 mg/dL (ref >=40)
LDL Cholesterol: 53 mg/dL (ref <130)
Total CHOL/HDL Ratio: 1.7 Ratio (ref <=5.0)
Triglycerides: 52 mg/dL (ref <150)
VLDL: 10 mg/dL (ref <30)

## 2015-01-12 ENCOUNTER — Encounter: Payer: Self-pay | Admitting: *Deleted

## 2015-01-24 ENCOUNTER — Other Ambulatory Visit: Payer: Self-pay | Admitting: *Deleted

## 2015-01-24 MED ORDER — AMLODIPINE BESYLATE 10 MG PO TABS: 10.0000 mg | ORAL_TABLET | Freq: Every morning | ORAL | Status: DC

## 2015-02-23 ENCOUNTER — Telehealth: Payer: Self-pay | Admitting: Hematology

## 2015-02-23 NOTE — Telephone Encounter (Signed)
new patient appt-s/w patient wife and gave np appt for01/12 @ 11 w/Dr. Irene Limbo Referring Dr. Chalmers Cater Dx- Anemia

## 2015-02-27 ENCOUNTER — Other Ambulatory Visit (HOSPITAL_COMMUNITY): Payer: Self-pay | Admitting: Endocrinology

## 2015-02-27 DIAGNOSIS — E0590 Thyrotoxicosis, unspecified without thyrotoxic crisis or storm: Secondary | ICD-10-CM

## 2015-03-08 ENCOUNTER — Ambulatory Visit (HOSPITAL_COMMUNITY)
Admission: RE | Admit: 2015-03-08 | Discharge: 2015-03-08 | Disposition: A | Discharge status: Home / Self Care | Payer: Medicare Other | Source: Ambulatory Visit | Attending: Endocrinology | Admitting: Endocrinology

## 2015-03-08 DIAGNOSIS — E0590 Thyrotoxicosis, unspecified without thyrotoxic crisis or storm: Secondary | ICD-10-CM | POA: Diagnosis not present

## 2015-03-08 MED ORDER — SODIUM IODIDE I 131 CAPSULE
15.0600 | Freq: Once | INTRAVENOUS | Status: AC | PRN
  Administered 2015-03-08: 15.06 via ORAL

## 2015-03-15 ENCOUNTER — Encounter: Payer: Self-pay | Admitting: Hematology

## 2015-03-15 ENCOUNTER — Ambulatory Visit (HOSPITAL_BASED_OUTPATIENT_CLINIC_OR_DEPARTMENT_OTHER): Payer: Medicare Other

## 2015-03-15 ENCOUNTER — Telehealth: Payer: Self-pay | Admitting: Hematology

## 2015-03-15 ENCOUNTER — Ambulatory Visit (HOSPITAL_BASED_OUTPATIENT_CLINIC_OR_DEPARTMENT_OTHER): Payer: Medicare Other | Admitting: Hematology

## 2015-03-15 VITALS — BP 154/67 | HR 85 | Temp 98.3°F | Resp 18 | Ht 69.0 in | Wt 152.7 lb

## 2015-03-15 DIAGNOSIS — D509 Iron deficiency anemia, unspecified: Secondary | ICD-10-CM

## 2015-03-15 DIAGNOSIS — D649 Anemia, unspecified: Secondary | ICD-10-CM | POA: Diagnosis not present

## 2015-03-15 DIAGNOSIS — E0590 Thyrotoxicosis, unspecified without thyrotoxic crisis or storm: Secondary | ICD-10-CM | POA: Diagnosis not present

## 2015-03-15 DIAGNOSIS — Z87891 Personal history of nicotine dependence: Secondary | ICD-10-CM

## 2015-03-15 LAB — COMPREHENSIVE METABOLIC PANEL
ALT: 12 U/L (ref 0–55)
AST: 17 U/L (ref 5–34)
Albumin: 3.8 g/dL (ref 3.5–5.0)
Alkaline Phosphatase: 124 U/L (ref 40–150)
Anion Gap: 9 mEq/L (ref 3–11)
BUN: 10.1 mg/dL (ref 7.0–26.0)
CO2: 26 mEq/L (ref 22–29)
Calcium: 9.1 mg/dL (ref 8.4–10.4)
Chloride: 107 mEq/L (ref 98–109)
Creatinine: 0.9 mg/dL (ref 0.7–1.3)
EGFR: >90 mL/min/{1.73_m2} (ref >90)
Glucose: 88 mg/dl (ref 70–140)
Potassium: 3.4 mEq/L — ABNORMAL LOW (ref 3.5–5.1)
Sodium: 142 mEq/L (ref 136–145)
Total Bilirubin: 0.55 mg/dL (ref 0.20–1.20)
Total Protein: 7.2 g/dL (ref 6.4–8.3)

## 2015-03-15 LAB — CBC & DIFF AND RETIC
BASO%: 0.6 % (ref 0.0–2.0)
Basophils Absolute: 0.1 10*3/uL (ref 0.0–0.1)
EOS%: 0.7 % (ref 0.0–7.0)
Eosinophils Absolute: 0.1 10*3/uL (ref 0.0–0.5)
HCT: 31.3 % — ABNORMAL LOW (ref 38.4–49.9)
HGB: 9.4 g/dL — ABNORMAL LOW (ref 13.0–17.1)
Immature Retic Fract: 20.3 % — ABNORMAL HIGH (ref 3.00–10.60)
LYMPH%: 34.5 % (ref 14.0–49.0)
MCH: 23.9 pg — ABNORMAL LOW (ref 27.2–33.4)
MCHC: 30 g/dL — ABNORMAL LOW (ref 32.0–36.0)
MCV: 79.4 fL (ref 79.3–98.0)
MONO#: 0.8 10*3/uL (ref 0.1–0.9)
MONO%: 7.7 % (ref 0.0–14.0)
NEUT#: 5.5 10*3/uL (ref 1.5–6.5)
NEUT%: 56.5 % (ref 39.0–75.0)
Platelets: 360 10*3/uL (ref 140–400)
RBC: 3.94 10*6/uL — ABNORMAL LOW (ref 4.20–5.82)
RDW: 16.6 % — ABNORMAL HIGH (ref 11.0–14.6)
Retic %: 4.19 % — ABNORMAL HIGH (ref 0.80–1.80)
Retic Ct Abs: 165.09 10*3/uL — ABNORMAL HIGH (ref 34.80–93.90)
WBC: 9.8 10*3/uL (ref 4.0–10.3)
lymph#: 3.4 10*3/uL — ABNORMAL HIGH (ref 0.9–3.3)

## 2015-03-15 LAB — LACTATE DEHYDROGENASE: LDH: 137 U/L (ref 125–245)

## 2015-03-15 LAB — TECHNOLOGIST REVIEW

## 2015-03-15 LAB — CHCC SMEAR

## 2015-03-15 LAB — FERRITIN: Ferritin: 27 ng/ml (ref 22–316)

## 2015-03-15 NOTE — Telephone Encounter (Signed)
Gv pt appt for 03/29/15.

## 2015-03-16 LAB — KAPPA/LAMBDA LIGHT CHAINS
Ig Kappa Free Light Chain: 14.52 mg/L (ref 3.30–19.40)
Ig Lambda Free Light Chain: 16.37 mg/L (ref 5.71–26.30)
Kappa/Lambda FluidC Ratio: 0.89 (ref 0.26–1.65)

## 2015-03-16 LAB — HAPTOGLOBIN: Haptoglobin: 184 mg/dL (ref 34–200)

## 2015-03-16 LAB — VITAMIN B12: Vitamin B12: 844 pg/mL (ref 211–946)

## 2015-03-22 LAB — MULTIPLE MYELOMA PANEL, SERUM
Albumin SerPl Elph-Mcnc: 3.5 g/dL (ref 2.9–4.4)
Albumin/Glob SerPl: 1.1 (ref 0.7–1.7)
Alpha 1: 0.3 g/dL (ref 0.0–0.4)
Alpha2 Glob SerPl Elph-Mcnc: 0.8 g/dL (ref 0.4–1.0)
B-Globulin SerPl Elph-Mcnc: 1.1 g/dL (ref 0.7–1.3)
Gamma Glob SerPl Elph-Mcnc: 1.1 g/dL (ref 0.4–1.8)
Globulin, Total: 3.2 g/dL (ref 2.2–3.9)
IgA, Qn, Serum: 213 mg/dL (ref 61–437)
IgG, Qn, Serum: 1044 mg/dL (ref 700–1600)
IgM, Qn, Serum: 68 mg/dL (ref 20–172)
Total Protein: 6.7 g/dL (ref 6.0–8.5)

## 2015-03-26 ENCOUNTER — Other Ambulatory Visit: Payer: Self-pay | Admitting: Nurse Practitioner

## 2015-03-26 DIAGNOSIS — D649 Anemia, unspecified: Secondary | ICD-10-CM

## 2015-03-27 LAB — FOLATE RBC
Folate, Hemolysate: 557.3 ng/mL
Folate, RBC: 1715 ng/mL (ref >498)
Hematocrit: 32.5 % — ABNORMAL LOW (ref 37.5–51.0)

## 2015-03-29 ENCOUNTER — Telehealth: Payer: Self-pay | Admitting: Hematology

## 2015-03-29 ENCOUNTER — Telehealth: Payer: Self-pay | Admitting: *Deleted

## 2015-03-29 ENCOUNTER — Encounter: Payer: Self-pay | Admitting: Hematology

## 2015-03-29 ENCOUNTER — Ambulatory Visit (HOSPITAL_BASED_OUTPATIENT_CLINIC_OR_DEPARTMENT_OTHER): Payer: Medicare Other | Admitting: Hematology

## 2015-03-29 VITALS — BP 160/74 | HR 65 | Temp 98.4°F | Resp 18 | Ht 69.0 in | Wt 152.4 lb

## 2015-03-29 DIAGNOSIS — D500 Iron deficiency anemia secondary to blood loss (chronic): Secondary | ICD-10-CM

## 2015-03-29 DIAGNOSIS — E0590 Thyrotoxicosis, unspecified without thyrotoxic crisis or storm: Secondary | ICD-10-CM

## 2015-03-29 NOTE — Progress Notes (Signed)
Marland Kitchen    HEMATOLOGY/ONCOLOGY CONSULTATION NOTE  Date of Service: .03/15/2015  Patient Care Team: [name], MD as PCP - General (Internal Medicine)   Endocrinologist: Dr. Jacelyn Pi  CHIEF COMPLAINTS/PURPOSE OF CONSULTATION:  Evaluation and management of anemia  HISTORY OF PRESENTING ILLNESS:  Shawn Fernandez is a wonderful 70 y.o. male who has been referred to [place] by Dr Chalmers Cater for evaluation and management of anemia.  Patient has a h/o HTN, dyslipidemia, AAA s/p endovascular stent graft (on ASA + plavix), previously Iron deficiency anemia in 2011 when his ferritin level was 9 and he notes that he received a PRBC transfusion. Patient notes that he did have a GI workup at the time that was unrevealing though available EGD results showed duodenitis, hiatal hernia. Patient has been diagnosed with hyperthyroidism this past year and was treated with methimazole for 6-8 months and has been off this medication for a few weeks and on 03/08/2015 was treated with radioactive iodine ablation.   He was noted to have normal hgb of 12.5 with an MCV of 79.4 in 07/26/2014 after which he had progressive anemia with hgb 11.4 on 12/01/14  Then 9.4 on 02/06/2015 and 7.8 with mcv 82.4 on 02/19/2015. At that time TSH 0.315. Patient was started on some iron containing supplement. Patient notes no overt evidence of bleeding though high risk of bleeding with dual antiplatelet therapy ASA+ plavix as well as cilostazol.  Patient labs today notes hgb improved to 9.4 with MCV 79. Patient notes some mild fatigue but no other overt focal symptoms. No abdominal pain. No other overt bleeding - no hematuria/gum bleeding/epistaxis/hematuria. No CP/SOB/dizziness.   MEDICAL HISTORY:  Past Medical History  Diagnosis Date  . Peripheral vascular disease ([place])   . HTN (hypertension)   . Dyslipidemia   . FHx: heart disease   . Blood clot in abdominal vein     treated at [facility]  . Carotid artery disease ([place])   .  History of blood transfusion 8-9 yrs ago   Hyperthyroidism -being followed by Dr. Chalmers Cater - was previously on methimazole for 6-8 months and has been off for a few weeks now. He was treated with radioactive iodine about one week ago.  He had presented with progressive weight loss from 2 years prior to diagnosis.  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  Previous history of iron deficiency anemia ferritin was 9 about 5 years ago in 2011  SURGICAL HISTORY: Past Surgical History  Procedure Laterality Date  . US echocardiography  07-20-2007    EF 55-60%  . Ankle surgery Right yrs ago  . Stent in abdominal vein  4-5 yrs ago  . Arm surgery Left yrs ago    2 rods inserted, 1 rod later removed  . Colonoscopy with propofol N/A 09/16/2013    Procedure: COLONOSCOPY WITH PROPOFOL;  Surgeon: Beryle Beams, MD;  Location: [facility] ENDOSCOPY;  Service: Endoscopy;  Laterality: N/A;  . Lower extremity angiogram N/A 07/24/2011    Procedure: LOWER EXTREMITY ANGIOGRAM;  Surgeon: Lorretta Harp, MD;  Location: [facility] CATH LAB;  Service: Cardiovascular;  Laterality: N/A;    SOCIAL HISTORY: Social History   Social History  . Marital Status: Married    Spouse Name: N/A  . Number of Children: N/A  . Years of Education: N/A   Occupational History  . Not on file.   Social History Main Topics  . Smoking status: Former Smoker -- 0.75 packs/day for 20 years  Types: Cigarettes    Quit date: 07/01/2009  . Smokeless tobacco: Never Used  . Alcohol Use: Yes     Comment: only occ  . Drug Use: No  . Sexual Activity: Not on file   Other Topics Concern  . Not on file   Social History Narrative    FAMILY HISTORY: Family History  Problem Relation Age of Onset  . Heart disease Brother   . Heart disease Mother   . Cancer Father     ? type    ALLERGIES:  is allergic to lisinopril.  MEDICATIONS:  Current Outpatient Prescriptions  Medication Sig  Dispense Refill  . amLODipine (NORVASC) 10 MG tablet Take 1 tablet (10 mg total) by mouth every morning. 30 tablet 6  . aspirin EC 81 MG tablet Take 81 mg by mouth daily.    . cilostazol (PLETAL) 50 MG tablet Take 50 mg by mouth every morning.    . clopidogrel (PLAVIX) 75 MG tablet Take 75 mg by mouth daily.  5  . Fe Fum-FePoly-Vit C-Vit B3 (INTEGRA PO) Take by mouth.    . losartan (COZAAR) 100 MG tablet Take 100 mg by mouth every morning.     . Multiple Vitamin (MULTIVITAMIN WITH MINERALS) TABS tablet Take 1 tablet by mouth daily.    Marland Kitchen omeprazole (PRILOSEC) 20 MG capsule Take 20 mg by mouth daily.    . simvastatin (ZOCOR) 20 MG tablet TAKE 1 TABLET (20 MG TOTAL) BY MOUTH EVERY EVENING. 90 tablet 1   No current facility-administered medications for this visit.    REVIEW OF SYSTEMS:    10 Point review of Systems was done is negative except as noted above.  PHYSICAL EXAMINATION: ECOG PERFORMANCE STATUS: 1 - Symptomatic but completely ambulatory  . Filed Vitals:   03/15/15 1056  BP: 154/67  Pulse: 85  Temp: 98.3 F (36.8 C)  Resp: 18   Filed Weights   03/15/15 1056  Weight: 152 lb 11.2 oz (69.264 kg)   .Body mass index is 22.54 kg/(m^2).  GENERAL:alert, in no acute distress and comfortable SKIN: skin color, texture, turgor are normal, no rashes or significant lesions EYES: normal, conjunctiva are pink and non-injected, sclera clear OROPHARYNX:no exudate, no erythema and lips, buccal mucosa, and tongue normal  NECK: supple, no JVD, thyroid normal size, non-tender, without nodularity LYMPH:  no palpable lymphadenopathy in the cervical, axillary or inguinal LUNGS: clear to auscultation with normal respiratory effort HEART: regular rate & rhythm,  no murmurs and no lower extremity edema ABDOMEN: abdomen soft, non-tender, normoactive bowel sounds  Musculoskeletal: no cyanosis of digits and no clubbing  PSYCH: alert & oriented x 3 with fluent speech NEURO: no focal motor/sensory  deficits  LABORATORY DATA:  I have reviewed the data as listed  . CBC Latest Ref Rng 03/15/2015 03/15/2015 06/29/2014  WBC 4.0 - 10.3 10e3/uL 9.8 - 8.0  Hemoglobin 13.0 - 17.1 g/dL 9.4(L) - 12.1(L)  Hematocrit 37.5 - 51.0 % 31.3(L) 32.5 (Revised)(L) 37.3(L)  Platelets 140 - 400 10e3/uL 360 - 233.0   . CBC    Component Value Date/Time   WBC 9.8 03/15/2015 1221   WBC 8.0 06/29/2014 1231   RBC 3.94* 03/15/2015 1221   RBC 4.76 06/29/2014 1231   HGB 9.4* 03/15/2015 1221   HGB 12.1* 06/29/2014 1231   HCT 32.5 (Revised) * 03/15/2015 1221   HCT 31.3* 03/15/2015 1221   HCT 37.3* 06/29/2014 1231   PLT 360 03/15/2015 1221   PLT 233.0 06/29/2014 1231   MCV  79.4 03/15/2015 1221   MCV 78.5 06/29/2014 1231   MCH 23.9* 03/15/2015 1221   MCH 27.6 01/25/2012 1521   MCHC 30.0* 03/15/2015 1221   MCHC 32.5 06/29/2014 1231   RDW 16.6* 03/15/2015 1221   RDW 14.7 06/29/2014 1231   LYMPHSABS 3.4* 03/15/2015 1221   LYMPHSABS 1.8 01/25/2012 1521   MONOABS 0.8 03/15/2015 1221   MONOABS 1.0 01/25/2012 1521   EOSABS 0.1 03/15/2015 1221   EOSABS 0.1 01/25/2012 1521   BASOSABS 0.1 03/15/2015 1221   BASOSABS 0.1 01/25/2012 1521     . CMP Latest Ref Rng 03/15/2015 03/15/2015 01/10/2015  Glucose 70 - 140 mg/dl 88 - 96  BUN 7.0 - 26.0 mg/dL 10.1 - 10  Creatinine 0.7 - 1.3 mg/dL 0.9 - 0.97  Sodium 136 - 145 mEq/L 142 - 141  Potassium 3.5 - 5.1 mEq/L 3.4(L) - 3.6  Chloride 98 - 110 mmol/L - - 107  CO2 22 - 29 mEq/L 26 - 27  Calcium 8.4 - 10.4 mg/dL 9.1 - 9.0  Total Protein 6.0 - 8.5 g/dL 7.2 6.7 7.2  Total Bilirubin 0.20 - 1.20 mg/dL 0.55 - 0.7  Alkaline Phos 40 - 150 U/L 124 - 154(H)  AST 5 - 34 U/L 17 - 29  ALT 0 - 55 U/L 12 - 11   RADIOGRAPHIC STUDIES: I have personally reviewed the radiological images as listed and agreed with the findings in the report. Nm Rai Therapy For Hyperthyroidism  03/08/2015  CLINICAL DATA:  Hyperthyroidism EXAM: RADIOACTIVE IODINE THERAPY FOR HYPERTHYROIDISM  COMPARISON:  None. TECHNIQUE: Radioactive iodine prescribed by Dr. Clovis Riley. The risks and benefits of radioactive iodine therapy were discussed with the patient in detail by Dr. Clovis Riley. Alternative therapies were also mentioned. Radiation safety was discussed with the patient, including how to protect the general public from exposure. There were no barriers to communication. Written consent was obtained. The patient then received a capsule containing the radiopharmaceutical. The patient will follow-up with the referring physician. RADIOPHARMACEUTICALS:  15.06 mCi I-131 sodium iodide orally IMPRESSION: Per oral administration of I-131 sodium iodide for the treatment of hyperthyroidism. Electronically Signed   By: Kerby Moors M.D.   On: 03/08/2015 13:43    ASSESSMENT & PLAN:   70 yo AAM with   1) Microcytic Anemia with MCV 79.4. No overt evidence of bleeding. He has had significant iron deficiency anemia in the past in 2011 with a ferritin level of 9 and required a blood transfusion. EGD showed duodenitis and hiatal hernia. Patient remains at high risk of GI bleeding since he is on ASA+ plavix for his endovascular stent graft for repaired AAA and is on Cilostazole for PAD. Some element of anemia also appears to be temporally related to methimazole use (though agranulocytosis is the usual concern and isolated anemia is less usual) The hyperthyroidism itself can also contribute to the anemia until corrected. Plan - peripheral blood smear, ferritin, multiple myeloma labs, LDH, haptoglobin, B12 and folate. - f/u PCP to consider GI workup for concern regarding blood loss -hyperthyroidism treated with RAI on 03/08/2015 and should be improving. -patient off methimazole (which might be helping as well) -if significantly Iron deficiency would consider IV iron replacement.  . Orders Placed This Encounter  Procedures  . CBC & Diff and Retic    Standing Status: Future     Number of Occurrences: 1      Standing Expiration Date: 04/18/2016  . Comprehensive metabolic panel    Standing Status: Future  Number of Occurrences: 1     Standing Expiration Date: 03/14/2016  . Smear    Standing Status: Future     Number of Occurrences: 1     Standing Expiration Date: 03/14/2016  . Lactate dehydrogenase (LDH) - [facility]    Standing Status: Future     Number of Occurrences: 1     Standing Expiration Date: 03/14/2016  . Haptoglobin    Standing Status: Future     Number of Occurrences: 1     Standing Expiration Date: 03/14/2016  . Ferritin    Standing Status: Future     Number of Occurrences: 1     Standing Expiration Date: 03/14/2016  . Iron and TIBC    Standing Status: Future     Number of Occurrences: 1     Standing Expiration Date: 03/14/2016  . Vitamin B12    Standing Status: Future     Number of Occurrences: 1     Standing Expiration Date: 03/14/2016  . Folate RBC    Standing Status: Future     Number of Occurrences: 1     Standing Expiration Date: 03/14/2016  . SPEP & IFE with QIG    Standing Status: Future     Number of Occurrences: 1     Standing Expiration Date: 04/18/2016  . Kappa/lambda light chains    Standing Status: Future     Number of Occurrences: 1     Standing Expiration Date: 04/18/2016     All of the patients questions were answered  To his apparent satisfaction. The patient knows to call the clinic with any problems, questions or concerns.  I spent 55 minutes counseling the patient face to face. The total time spent in the appointment was 60 minutes and more than 50% was on counseling and direct patient cares.    Sullivan Lone MD [place] AAHIVMS [facility] Hematology/Oncology Physician [facility]  (Office):       [phone] (Work cell):  [phone] (Fax):           [phone]

## 2015-03-29 NOTE — Telephone Encounter (Signed)
Per staff message and POF I have scheduled appts. Advised scheduler of appts. JMW  

## 2015-03-29 NOTE — Telephone Encounter (Signed)
Pt confirmed labs/ov per 01/26 POF, gave pt AVS and Calendar... KJ, sent msg to add IV Feraheme

## 2015-03-30 ENCOUNTER — Telehealth: Payer: Self-pay | Admitting: Hematology

## 2015-03-30 NOTE — Telephone Encounter (Signed)
S/w pt confirming updated schedule with IV added for 01/31 and pt will p/u schedule when he comes in at next visit... KJ

## 2015-03-30 NOTE — Progress Notes (Signed)
Marland Kitchen    HEMATOLOGY/ONCOLOGY CLINIC NOTE  Date of Service: .03/29/2015  Patient Care Team: [name], MD as PCP - General (Internal Medicine)   Endocrinologist: Dr. Jacelyn Pi  CHIEF COMPLAINTS/PURPOSE OF CONSULTATION:  Evaluation and management of anemia  HISTORY OF PRESENTING ILLNESS: plz see initial consultation for details on initial presentation  INTERVAL HISTORY  Mr Janowiak is here for a f/u of his labs and anemia. He is noted to be iron deficient. He notes he recently had a fecal occult blood testing with his PCP that was noted to be negative. No overt GI bleeding. He notes tha the he has been setup for an abdominal ultrasound. He discussed that we will recommend a GI evaluation even if the fecal occult blood test was negative.   MEDICAL HISTORY:  Past Medical History  Diagnosis Date  . Peripheral vascular disease ([place])   . HTN (hypertension)   . Dyslipidemia   . FHx: heart disease   . Blood clot in abdominal vein     treated at [facility]  . Carotid artery disease ([place])   . History of blood transfusion 8-9 yrs ago   Hyperthyroidism -being followed by Dr. Chalmers Cater - was previously on methimazole for 6-8 months and has been off for a few weeks now. He was treated with radioactive iodine about one week ago.  He had presented with progressive weight loss from 2 years prior to diagnosis.  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  Previous history of iron deficiency anemia ferritin was 9 about 5 years ago in 2011  SURGICAL HISTORY: Past Surgical History  Procedure Laterality Date  . US echocardiography  07-20-2007    EF 55-60%  . Ankle surgery Right yrs ago  . Stent in abdominal vein  4-5 yrs ago  . Arm surgery Left yrs ago    2 rods inserted, 1 rod later removed  . Colonoscopy with propofol N/A 09/16/2013    Procedure: COLONOSCOPY WITH PROPOFOL;  Surgeon: Beryle Beams, MD;  Location: [facility] ENDOSCOPY;  Service:  Endoscopy;  Laterality: N/A;  . Lower extremity angiogram N/A 07/24/2011    Procedure: LOWER EXTREMITY ANGIOGRAM;  Surgeon: Lorretta Harp, MD;  Location: [facility] CATH LAB;  Service: Cardiovascular;  Laterality: N/A;    SOCIAL HISTORY: Social History   Social History  . Marital Status: Married    Spouse Name: N/A  . Number of Children: N/A  . Years of Education: N/A   Occupational History  . Not on file.   Social History Main Topics  . Smoking status: Former Smoker -- 0.75 packs/day for 20 years    Types: Cigarettes    Quit date: 07/01/2009  . Smokeless tobacco: Never Used  . Alcohol Use: Yes     Comment: only occ  . Drug Use: No  . Sexual Activity: Not on file   Other Topics Concern  . Not on file   Social History Narrative    FAMILY HISTORY: Family History  Problem Relation Age of Onset  . Heart disease Brother   . Heart disease Mother   . Cancer Father     ? type    ALLERGIES:  is allergic to lisinopril.  MEDICATIONS:  Current Outpatient Prescriptions  Medication Sig Dispense Refill  . amLODipine (NORVASC) 10 MG tablet Take 1 tablet (10 mg total) by mouth every morning. 30 tablet 6  . aspirin EC 81 MG tablet Take 81 mg by mouth daily.    Marland Kitchen  cilostazol (PLETAL) 50 MG tablet Take 50 mg by mouth every morning.    . clopidogrel (PLAVIX) 75 MG tablet Take 75 mg by mouth daily.  5  . Fe Fum-FePoly-Vit C-Vit B3 (INTEGRA PO) Take by mouth.    . losartan (COZAAR) 100 MG tablet Take 100 mg by mouth every morning.     . Multiple Vitamin (MULTIVITAMIN WITH MINERALS) TABS tablet Take 1 tablet by mouth daily.    Marland Kitchen omeprazole (PRILOSEC) 20 MG capsule Take 20 mg by mouth daily.    . simvastatin (ZOCOR) 20 MG tablet TAKE 1 TABLET (20 MG TOTAL) BY MOUTH EVERY EVENING. 90 tablet 1   No current facility-administered medications for this visit.    REVIEW OF SYSTEMS:    10 Point review of Systems was done is negative except as noted above.  PHYSICAL EXAMINATION: ECOG  PERFORMANCE STATUS: 1 - Symptomatic but completely ambulatory  . Filed Vitals:   03/29/15 0906  BP: 160/74  Pulse: 65  Temp: 98.4 F (36.9 C)  Resp: 18   Filed Weights   03/29/15 0906  Weight: 152 lb 6.4 oz (69.128 kg)   .Body mass index is 22.5 kg/(m^2).  GENERAL:alert, in no acute distress and comfortable SKIN: skin color, texture, turgor are normal, no rashes or significant lesions EYES: normal, conjunctiva are pink and non-injected, sclera clear OROPHARYNX:no exudate, no erythema and lips, buccal mucosa, and tongue normal  NECK: supple, no JVD, thyroid normal size, non-tender, without nodularity LYMPH:  no palpable lymphadenopathy in the cervical, axillary or inguinal LUNGS: clear to auscultation with normal respiratory effort HEART: regular rate & rhythm,  no murmurs and no lower extremity edema ABDOMEN: abdomen soft, non-tender, normoactive bowel sounds  Musculoskeletal: no cyanosis of digits and no clubbing  PSYCH: alert & oriented x 3 with fluent speech NEURO: no focal motor/sensory deficits  LABORATORY DATA:  I have reviewed the data as listed  . CBC Latest Ref Rng 03/15/2015 03/15/2015 06/29/2014  WBC 4.0 - 10.3 10e3/uL 9.8 - 8.0  Hemoglobin 13.0 - 17.1 g/dL 9.4(L) - 12.1(L)  Hematocrit 37.5 - 51.0 % 31.3(L) 32.5 (Revised)(L) 37.3(L)  Platelets 140 - 400 10e3/uL 360 - 233.0   . CBC    Component Value Date/Time   WBC 9.8 03/15/2015 1221   WBC 8.0 06/29/2014 1231   RBC 3.94* 03/15/2015 1221   RBC 4.76 06/29/2014 1231   HGB 9.4* 03/15/2015 1221   HGB 12.1* 06/29/2014 1231   HCT 32.5 (Revised) * 03/15/2015 1221   HCT 31.3* 03/15/2015 1221   HCT 37.3* 06/29/2014 1231   PLT 360 03/15/2015 1221   PLT 233.0 06/29/2014 1231   MCV 79.4 03/15/2015 1221   MCV 78.5 06/29/2014 1231   MCH 23.9* 03/15/2015 1221   MCH 27.6 01/25/2012 1521   MCHC 30.0* 03/15/2015 1221   MCHC 32.5 06/29/2014 1231   RDW 16.6* 03/15/2015 1221   RDW 14.7 06/29/2014 1231   LYMPHSABS  3.4* 03/15/2015 1221   LYMPHSABS 1.8 01/25/2012 1521   MONOABS 0.8 03/15/2015 1221   MONOABS 1.0 01/25/2012 1521   EOSABS 0.1 03/15/2015 1221   EOSABS 0.1 01/25/2012 1521   BASOSABS 0.1 03/15/2015 1221   BASOSABS 0.1 01/25/2012 1521     . CMP Latest Ref Rng 03/15/2015 03/15/2015 01/10/2015  Glucose 70 - 140 mg/dl 88 - 96  BUN 7.0 - 26.0 mg/dL 10.1 - 10  Creatinine 0.7 - 1.3 mg/dL 0.9 - 0.97  Sodium 136 - 145 mEq/L 142 - 141  Potassium 3.5 -  5.1 mEq/L 3.4(L) - 3.6  Chloride 98 - 110 mmol/L - - 107  CO2 22 - 29 mEq/L 26 - 27  Calcium 8.4 - 10.4 mg/dL 9.1 - 9.0  Total Protein 6.0 - 8.5 g/dL 7.2 6.7 7.2  Total Bilirubin 0.20 - 1.20 mg/dL 0.55 - 0.7  Alkaline Phos 40 - 150 U/L 124 - 154(H)  AST 5 - 34 U/L 17 - 29  ALT 0 - 55 U/L 12 - 11   Peripheral blood smear - Population of microcytic hypochromic RBC, no increased schistocytes or microspherocytes. Adequate platelets with no platelet clumping, no increased blasts.  . No results found for: TOTALPROTELP, ALBUMINELP, A1GS, A2GS, BETS, BETA2SER, GAMS, MSPIKE, SPEI (this displays SPEP labs)  Lab Results  Component Value Date   KAPLAMBRATIO 0.89 03/15/2015   (kappa/lambda light chains)  . Lab Results  Component Value Date   IRON <10* 05/11/2009   TIBC NOT CALC Not calculated due to Iron <10. 05/11/2009   IRONPCTSAT NOT CALC Not calculated due to Iron <10. 05/11/2009   (Iron and TIBC)  Lab Results  Component Value Date   FERRITIN 27 03/15/2015   Component     Latest Ref Rng 03/15/2015  IgG, Qn, Serum     700 - 1600 mg/dL 1,044  IgA, Qn, Serum     61 - 437 mg/dL 213  IgM, Qn, Serum     20 - 172 mg/dL 68  Total Protein     6.0 - 8.5 g/dL 6.7  Albumin SerPl Elph-Mcnc     2.9 - 4.4 g/dL 3.5  Alpha 1     0.0 - 0.4 g/dL 0.3  Alpha2 Glob SerPl Elph-Mcnc     0.4 - 1.0 g/dL 0.8  B-Globulin SerPl Elph-Mcnc     0.7 - 1.3 g/dL 1.1  Gamma Glob SerPl Elph-Mcnc     0.4 - 1.8 g/dL 1.1  M Protein SerPl Elph-Mcnc     Not  Observed g/dL Not Observed  Globulin, Total     2.2 - 3.9 g/dL 3.2  Albumin/Glob SerPl     0.7 - 1.7 1.1  IFE 1      Comment  Please Note (HCV):      Comment  Folate, Hemolysate     Not Estab. ng/mL 557.3 (Revised)  HCT     37.5 - 51.0 % 32.5 (Revised) (L)  Folate, RBC     >498 ng/mL 1,715 (Revised)  Ig Kappa Free Light Chain     3.30 - 19.40 mg/L 14.52  Ig Lambda Free Light Chain     5.71 - 26.30 mg/L 16.37  Kappa/Lambda FluidC Ratio     0.26 - 1.65 0.89  LDH     125 - 245 U/L 137  Haptoglobin     34 - 200 mg/dL 184  VITAMIN B12     211 - 946 pg/mL 844     RADIOGRAPHIC STUDIES: I have personally reviewed the radiological images as listed and agreed with the findings in the report. Nm Rai Therapy For Hyperthyroidism  03/08/2015  CLINICAL DATA:  Hyperthyroidism EXAM: RADIOACTIVE IODINE THERAPY FOR HYPERTHYROIDISM COMPARISON:  None. TECHNIQUE: Radioactive iodine prescribed by Dr. Clovis Riley. The risks and benefits of radioactive iodine therapy were discussed with the patient in detail by Dr. Clovis Riley. Alternative therapies were also mentioned. Radiation safety was discussed with the patient, including how to protect the general public from exposure. There were no barriers to communication. Written consent was obtained. The patient then received a capsule containing the  radiopharmaceutical. The patient will follow-up with the referring physician. RADIOPHARMACEUTICALS:  15.06 mCi I-131 sodium iodide orally IMPRESSION: Per oral administration of I-131 sodium iodide for the treatment of hyperthyroidism. Electronically Signed   By: Kerby Moors M.D.   On: 03/08/2015 13:43    ASSESSMENT & PLAN:   70 yo AAM with   1) Microcytic hypochromic Anemia with MCV 79.4 due to Iron deficiency. Ferritin 27, Iron sat too low to calculate. No overt evidence of bleeding. Patient notes recent fecal occult blood neg x 1. He has had significant iron deficiency anemia in the past in 2011 with a ferritin  level of 9 and required a blood transfusion. EGD showed duodenitis and hiatal hernia. Patient remains at high risk of GI bleeding since he is on ASA+ plavix for his endovascular stent graft for repaired AAA and is on Cilostazole for PAD. Some element of anemia also appears to be temporally related to methimazole use (though agranulocytosis is the usual concern and isolated anemia is less usual) The hyperthyroidism itself can also contribute to the anemia until corrected. SPEP with no M spike No evidence of hemolysis B12/folate WNL Plan  -would recommend GI workup as determine per PCP. Patient high risk for occult GI bleeding on and off due to effective 3 antiplatelet agents. A lot of these bleeds might be difficult to detect. -patient notes he has been scheduled for a [place] abd to evaluate AAA and r/o bleeding which is reasonable. -if bleeding noted -will need to discuss with PCP and vascular surgery risk vs benefit of current antoplatelet therapy regimen or if it might be possible to de-escalate anti-platelet therapy. -we discussed oral vs IV iron replacement and the patient choose to pursue IV iron. -endocrinology following him for management of hyperthyroidism.  -will setup him up for IV feraheme 510mg  qweekly x 2 doses -RTC with Dr Irene Limbo in 8 weeks with cbc, cmp, TSH and iron labs. . Orders Placed This Encounter  Procedures  . CBC & Diff and Retic    Standing Status: Future     Number of Occurrences:      Standing Expiration Date: 05/02/2016  . Comprehensive metabolic panel    Standing Status: Future     Number of Occurrences:      Standing Expiration Date: 03/28/2016  . Ferritin    Standing Status: Future     Number of Occurrences:      Standing Expiration Date: 03/28/2016  . Iron and TIBC    Standing Status: Future     Number of Occurrences:      Standing Expiration Date: 03/28/2016  . TSH    Standing Status: Future     Number of Occurrences:      Standing Expiration Date:  03/28/2016     All of the patients questions were answered  To his apparent satisfaction. The patient knows to call the clinic with any problems, questions or concerns.  I spent 20 minutes counseling the patient face to face. The total time spent in the appointment was 25 minutes and more than 50% was on counseling and direct patient cares.    Sullivan Lone MD [place] AAHIVMS [facility] Hematology/Oncology Physician [facility]  (Office):       [phone] (Work cell):  [phone] (Fax):           [phone]

## 2015-04-03 ENCOUNTER — Ambulatory Visit (HOSPITAL_BASED_OUTPATIENT_CLINIC_OR_DEPARTMENT_OTHER): Payer: Medicare Other

## 2015-04-03 VITALS — BP 142/74 | HR 85 | Temp 98.1°F | Resp 17

## 2015-04-03 DIAGNOSIS — D500 Iron deficiency anemia secondary to blood loss (chronic): Secondary | ICD-10-CM | POA: Diagnosis not present

## 2015-04-03 MED ORDER — SODIUM CHLORIDE 0.9 % IV SOLN
Freq: Once | INTRAVENOUS | Status: AC
  Administered 2015-04-03: 14:00:00 via INTRAVENOUS

## 2015-04-03 MED ORDER — SODIUM CHLORIDE 0.9 % IV SOLN
510.0000 mg | Freq: Once | INTRAVENOUS | Status: AC
  Administered 2015-04-03: 510 mg via INTRAVENOUS
  Filled 2015-04-03: qty 17

## 2015-04-03 NOTE — Patient Instructions (Signed)

## 2015-04-03 NOTE — Progress Notes (Signed)
Pt tolerated first Feraheme infusion with no complaints.  Vitals obtained after 30 minute observation period and remain stable.  Pt discharged ambulatory without any questions or concerns at this time.

## 2015-04-10 ENCOUNTER — Ambulatory Visit (HOSPITAL_BASED_OUTPATIENT_CLINIC_OR_DEPARTMENT_OTHER): Payer: Medicare Other

## 2015-04-10 VITALS — BP 128/53 | HR 63 | Temp 97.7°F | Resp 18

## 2015-04-10 DIAGNOSIS — D500 Iron deficiency anemia secondary to blood loss (chronic): Secondary | ICD-10-CM

## 2015-04-10 MED ORDER — SODIUM CHLORIDE 0.9 % IV SOLN
Freq: Once | INTRAVENOUS | Status: AC
  Administered 2015-04-10: 14:00:00 via INTRAVENOUS

## 2015-04-10 MED ORDER — SODIUM CHLORIDE 0.9 % IV SOLN
510.0000 mg | Freq: Once | INTRAVENOUS | Status: AC
  Administered 2015-04-10: 510 mg via INTRAVENOUS
  Filled 2015-04-10: qty 17

## 2015-04-10 NOTE — Patient Instructions (Signed)

## 2015-05-24 ENCOUNTER — Ambulatory Visit (HOSPITAL_BASED_OUTPATIENT_CLINIC_OR_DEPARTMENT_OTHER): Payer: Medicare Other | Admitting: Hematology

## 2015-05-24 ENCOUNTER — Other Ambulatory Visit (HOSPITAL_BASED_OUTPATIENT_CLINIC_OR_DEPARTMENT_OTHER): Payer: Medicare Other

## 2015-05-24 ENCOUNTER — Telehealth: Payer: Self-pay | Admitting: Hematology

## 2015-05-24 VITALS — BP 141/72 | HR 47 | Temp 98.0°F | Resp 18 | Wt 158.0 lb

## 2015-05-24 DIAGNOSIS — E0590 Thyrotoxicosis, unspecified without thyrotoxic crisis or storm: Secondary | ICD-10-CM

## 2015-05-24 DIAGNOSIS — D500 Iron deficiency anemia secondary to blood loss (chronic): Secondary | ICD-10-CM

## 2015-05-24 DIAGNOSIS — D509 Iron deficiency anemia, unspecified: Secondary | ICD-10-CM | POA: Diagnosis not present

## 2015-05-24 LAB — CBC & DIFF AND RETIC
BASO%: 1 % (ref 0.0–2.0)
Basophils Absolute: 0.1 10*3/uL (ref 0.0–0.1)
EOS%: 4 % (ref 0.0–7.0)
Eosinophils Absolute: 0.3 10*3/uL (ref 0.0–0.5)
HCT: 35.7 % — ABNORMAL LOW (ref 38.4–49.9)
HGB: 11.4 g/dL — ABNORMAL LOW (ref 13.0–17.1)
Immature Retic Fract: 9.5 % (ref 3.00–10.60)
LYMPH%: 27.4 % (ref 14.0–49.0)
MCH: 27.2 pg (ref 27.2–33.4)
MCHC: 31.9 g/dL — ABNORMAL LOW (ref 32.0–36.0)
MCV: 85.2 fL (ref 79.3–98.0)
MONO#: 0.6 10*3/uL (ref 0.1–0.9)
MONO%: 8.6 % (ref 0.0–14.0)
NEUT#: 4.2 10*3/uL (ref 1.5–6.5)
NEUT%: 59 % (ref 39.0–75.0)
Platelets: 208 10*3/uL (ref 140–400)
RBC: 4.19 10*6/uL — ABNORMAL LOW (ref 4.20–5.82)
RDW: 17.1 % — ABNORMAL HIGH (ref 11.0–14.6)
Retic %: 2.8 % — ABNORMAL HIGH (ref 0.80–1.80)
Retic Ct Abs: 117.32 10*3/uL — ABNORMAL HIGH (ref 34.80–93.90)
WBC: 7.2 10*3/uL (ref 4.0–10.3)
lymph#: 2 10*3/uL (ref 0.9–3.3)

## 2015-05-24 LAB — COMPREHENSIVE METABOLIC PANEL
ALT: 9 U/L (ref 0–55)
AST: 20 U/L (ref 5–34)
Albumin: 3.7 g/dL (ref 3.5–5.0)
Alkaline Phosphatase: 102 U/L (ref 40–150)
Anion Gap: 5 mEq/L (ref 3–11)
BUN: 12.2 mg/dL (ref 7.0–26.0)
CO2: 28 mEq/L (ref 22–29)
Calcium: 9 mg/dL (ref 8.4–10.4)
Chloride: 110 mEq/L — ABNORMAL HIGH (ref 98–109)
Creatinine: 1.1 mg/dL (ref 0.7–1.3)
EGFR: 79 mL/min/{1.73_m2} — ABNORMAL LOW (ref >90)
Glucose: 88 mg/dl (ref 70–140)
Potassium: 4 mEq/L (ref 3.5–5.1)
Sodium: 142 mEq/L (ref 136–145)
Total Bilirubin: 0.36 mg/dL (ref 0.20–1.20)
Total Protein: 7 g/dL (ref 6.4–8.3)

## 2015-05-24 LAB — IRON AND TIBC
%SAT: 36 % (ref 20–55)
Iron: 93 ug/dL (ref 42–163)
TIBC: 258 ug/dL (ref 202–409)
UIBC: 165 ug/dL (ref 117–376)

## 2015-05-24 LAB — TSH: TSH: <0.08 m(IU)/L — ABNORMAL LOW (ref 0.320–4.118)

## 2015-05-24 LAB — FERRITIN: Ferritin: 87 ng/ml (ref 22–316)

## 2015-05-24 MED ORDER — POLYSACCHARIDE IRON COMPLEX 150 MG PO CAPS: 150.0000 mg | ORAL_CAPSULE | Freq: Every day | ORAL | Status: DC

## 2015-05-24 NOTE — Telephone Encounter (Signed)
per pof to sch pt appt-gave pt copy of avs °

## 2015-05-28 NOTE — Progress Notes (Signed)
Marland Kitchen    HEMATOLOGY/ONCOLOGY CLINIC NOTE  Date of Service: .05/24/2015   Patient Care Team: [name], MD as PCP - General (Internal Medicine)   Endocrinologist: Dr. Jacelyn Pi  CHIEF COMPLAINTS/PURPOSE OF CONSULTATION:  Evaluation and management of anemia  HISTORY OF PRESENTING ILLNESS: plz see initial consultation for details on initial presentation  INTERVAL HISTORY  Shawn Fernandez is here for a f/u of his labs and anemia. He tolerated the IV Feraheme well with no acute issues.  He notes that his energy levels are much improved.  His hemoglobin has improved from 9.4 through 11.4 with correction of his microcytosis.  He notes that he is following up with his endocrinologist to monitor his thyroid functions and reports that he was told that his thyroid function is getting better status post RAI ablation.  Notes Some fatigue.  His TSH levels today are still very suppressed.   MEDICAL HISTORY:  Past Medical History  Diagnosis Date  . Peripheral vascular disease ([place])   . HTN (hypertension)   . Dyslipidemia   . FHx: heart disease   . Blood clot in abdominal vein     treated at [facility]  . Carotid artery disease ([place])   . History of blood transfusion 8-9 yrs ago   Hyperthyroidism -being followed by Dr. Chalmers Cater - was previously on methimazole for 6-8 months and has been off for a few weeks now. He was treated with radioactive iodine about one week ago.  He had presented with progressive weight loss from 2 years prior to diagnosis.  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  Previous history of iron deficiency anemia ferritin was 9 about 5 years ago in 2011  SURGICAL HISTORY: Past Surgical History  Procedure Laterality Date  . US echocardiography  07-20-2007    EF 55-60%  . Ankle surgery Right yrs ago  . Stent in abdominal vein  4-5 yrs ago  . Arm surgery Left yrs ago    2 rods inserted, 1 rod later removed  .  Colonoscopy with propofol N/A 09/16/2013    Procedure: COLONOSCOPY WITH PROPOFOL;  Surgeon: Beryle Beams, MD;  Location: [facility] ENDOSCOPY;  Service: Endoscopy;  Laterality: N/A;  . Lower extremity angiogram N/A 07/24/2011    Procedure: LOWER EXTREMITY ANGIOGRAM;  Surgeon: Lorretta Harp, MD;  Location: [facility] CATH LAB;  Service: Cardiovascular;  Laterality: N/A;    SOCIAL HISTORY: Social History   Social History  . Marital Status: Married    Spouse Name: N/A  . Number of Children: N/A  . Years of Education: N/A   Occupational History  . Not on file.   Social History Main Topics  . Smoking status: Former Smoker -- 0.75 packs/day for 20 years    Types: Cigarettes    Quit date: 07/01/2009  . Smokeless tobacco: Never Used  . Alcohol Use: Yes     Comment: only occ  . Drug Use: No  . Sexual Activity: Not on file   Other Topics Concern  . Not on file   Social History Narrative    FAMILY HISTORY: Family History  Problem Relation Age of Onset  . Heart disease Brother   . Heart disease Mother   . Cancer Father     ? type    ALLERGIES:  is allergic to lisinopril.  MEDICATIONS:  Current Outpatient Prescriptions  Medication Sig Dispense Refill  . amLODipine (NORVASC) 10 MG tablet Take 1 tablet (10 mg total) by mouth every  morning. 30 tablet 6  . aspirin EC 81 MG tablet Take 81 mg by mouth daily.    . cilostazol (PLETAL) 50 MG tablet Take 50 mg by mouth every morning.    . clopidogrel (PLAVIX) 75 MG tablet Take 75 mg by mouth daily.  5  . iron polysaccharides (NIFEREX) 150 MG capsule Take 1 capsule (150 mg total) by mouth daily. 30 capsule 5  . losartan (COZAAR) 100 MG tablet Take 100 mg by mouth every morning.     . Multiple Vitamin (MULTIVITAMIN WITH MINERALS) TABS tablet Take 1 tablet by mouth daily.    Marland Kitchen omeprazole (PRILOSEC) 20 MG capsule Take 20 mg by mouth daily.    . simvastatin (ZOCOR) 20 MG tablet TAKE 1 TABLET (20 MG TOTAL) BY MOUTH EVERY EVENING. 90 tablet 1   No  current facility-administered medications for this visit.    REVIEW OF SYSTEMS:    10 Point review of Systems was done is negative except as noted above.  PHYSICAL EXAMINATION: ECOG PERFORMANCE STATUS: 1 - Symptomatic but completely ambulatory  . Filed Vitals:   05/24/15 0950  BP: 141/72  Pulse: 47  Temp: 98 F (36.7 C)  Resp: 18   Filed Weights   05/24/15 0950  Weight: 158 lb (71.668 kg)   .Body mass index is 23.32 kg/(m^2).  GENERAL:alert, in no acute distress and comfortable SKIN: skin color, texture, turgor are normal, no rashes or significant lesions EYES: normal, conjunctiva are pink and non-injected, sclera clear OROPHARYNX:no exudate, no erythema and lips, buccal mucosa, and tongue normal  NECK: supple, no JVD, thyroid normal size, non-tender, without nodularity LYMPH:  no palpable lymphadenopathy in the cervical, axillary or inguinal LUNGS: clear to auscultation with normal respiratory effort HEART: regular rate & rhythm,  no murmurs and no lower extremity edema ABDOMEN: abdomen soft, non-tender, normoactive bowel sounds  Musculoskeletal: no cyanosis of digits and no clubbing  PSYCH: alert & oriented x 3 with fluent speech NEURO: no focal motor/sensory deficits  LABORATORY DATA:  I have reviewed the data as listed  . CBC Latest Ref Rng 05/24/2015 03/15/2015 03/15/2015  WBC 4.0 - 10.3 10e3/uL 7.2 9.8 -  Hemoglobin 13.0 - 17.1 g/dL 11.4(L) 9.4(L) -  Hematocrit 38.4 - 49.9 % 35.7(L) 31.3(L) 32.5 (Revised)(L)  Platelets 140 - 400 10e3/uL 208 360 -   . CBC    Component Value Date/Time   WBC 7.2 05/24/2015 0912   WBC 8.0 06/29/2014 1231   RBC 4.19* 05/24/2015 0912   RBC 4.76 06/29/2014 1231   HGB 11.4* 05/24/2015 0912   HGB 12.1* 06/29/2014 1231   HCT 35.7* 05/24/2015 0912   HCT 32.5 (Revised) * 03/15/2015 1221   HCT 37.3* 06/29/2014 1231   PLT 208 05/24/2015 0912   PLT 233.0 06/29/2014 1231   MCV 85.2 05/24/2015 0912   MCV 78.5 06/29/2014 1231   MCH  27.2 05/24/2015 0912   MCH 27.6 01/25/2012 1521   MCHC 31.9* 05/24/2015 0912   MCHC 32.5 06/29/2014 1231   RDW 17.1* 05/24/2015 0912   RDW 14.7 06/29/2014 1231   LYMPHSABS 2.0 05/24/2015 0912   LYMPHSABS 1.8 01/25/2012 1521   MONOABS 0.6 05/24/2015 0912   MONOABS 1.0 01/25/2012 1521   EOSABS 0.3 05/24/2015 0912   EOSABS 0.1 01/25/2012 1521   BASOSABS 0.1 05/24/2015 0912   BASOSABS 0.1 01/25/2012 1521    CMP Latest Ref Rng 05/24/2015 03/15/2015 03/15/2015  Glucose 70 - 140 mg/dl 88 88 -  BUN 7.0 - 26.0 mg/dL 12.2  10.1 -  Creatinine 0.7 - 1.3 mg/dL 1.1 0.9 -  Sodium 136 - 145 mEq/L 142 142 -  Potassium 3.5 - 5.1 mEq/L 4.0 3.4(L) -  Chloride 98 - 110 mmol/L - - -  CO2 22 - 29 mEq/L 28 26 -  Calcium 8.4 - 10.4 mg/dL 9.0 9.1 -  Total Protein 6.4 - 8.3 g/dL 7.0 7.2 6.7  Total Bilirubin 0.20 - 1.20 mg/dL 0.36 0.55 -  Alkaline Phos 40 - 150 U/L 102 124 -  AST 5 - 34 U/L 20 17 -  ALT 0 - 55 U/L 9 12 -   . Lab Results  Component Value Date   IRON 93 05/24/2015   TIBC 258 05/24/2015   IRONPCTSAT 36 05/24/2015   (Iron and TIBC)  Lab Results  Component Value Date   FERRITIN 87 05/24/2015   Component     Latest Ref Rng 03/15/2015  IgG, Qn, Serum     700 - 1600 mg/dL 1,044  IgA, Qn, Serum     61 - 437 mg/dL 213  IgM, Qn, Serum     20 - 172 mg/dL 68  Total Protein     6.0 - 8.5 g/dL 6.7  Albumin SerPl Elph-Mcnc     2.9 - 4.4 g/dL 3.5  Alpha 1     0.0 - 0.4 g/dL 0.3  Alpha2 Glob SerPl Elph-Mcnc     0.4 - 1.0 g/dL 0.8  B-Globulin SerPl Elph-Mcnc     0.7 - 1.3 g/dL 1.1  Gamma Glob SerPl Elph-Mcnc     0.4 - 1.8 g/dL 1.1  M Protein SerPl Elph-Mcnc     Not Observed g/dL Not Observed  Globulin, Total     2.2 - 3.9 g/dL 3.2  Albumin/Glob SerPl     0.7 - 1.7 1.1  IFE 1      Comment  Please Note (HCV):      Comment  Folate, Hemolysate     Not Estab. ng/mL 557.3 (Revised)  HCT     37.5 - 51.0 % 32.5 (Revised) (L)  Folate, RBC     >498 ng/mL 1,715 (Revised)  Ig Kappa  Free Light Chain     3.30 - 19.40 mg/L 14.52  Ig Lambda Free Light Chain     5.71 - 26.30 mg/L 16.37  Kappa/Lambda FluidC Ratio     0.26 - 1.65 0.89  LDH     125 - 245 U/L 137  Haptoglobin     34 - 200 mg/dL 184  VITAMIN B12     211 - 946 pg/mL 844     RADIOGRAPHIC STUDIES: I have personally reviewed the radiological images as listed and agreed with the findings in the report. No results found.  ASSESSMENT & PLAN:   70 yo AAM with   1) Microcytic hypochromic Anemia with MCV 79.4 due to Iron deficiency. Ferritin 27, Iron sat too low to calculate. No overt evidence of bleeding. Patient notes recent fecal occult blood neg x 1. He has had significant iron deficiency anemia in the past in 2011 with a ferritin level of 9 and required a blood transfusion. EGD showed duodenitis and hiatal hernia. Patient remains at high risk of GI bleeding since he is on ASA+ plavix for his endovascular stent graft for repaired AAA and is on Cilostazole for PAD. Some element of anemia also appears to be temporally related to methimazole use (though agranulocytosis is the usual concern and isolated anemia is less usual) The hyperthyroidism itself can also  contribute to the anemia until corrected. SPEP with no M spike No evidence of hemolysis B12/folate WNL Plan  -patient received IV feraheme x 2 doses wwith improvement in his iron levels and hemoglobin to 11.4. -he reports no overt bleeding. -would recommend GI workup as determine per PCP. Patient high risk for occult GI bleeding on and off due to effective 3 antiplatelet agents. A lot of these bleeds might be difficult to detect. --if bleeding noted or he gets repeatedly iron deficient -will need to discuss with PCP and vascular surgery risk vs benefit of current antiplatelet therapy regimen or if it might be possible to de-escalate anti-platelet therapy. -endocrinology following him for management of hyperthyroidism.  -RTC with Dr Irene Limbo in 6 months  with rpt cbc, cmp and ferritin. Earlier if any new concerns. Continue f/u with PCP. Marland Kitchen No orders of the defined types were placed in this encounter.   All of the patients questions were answered  To his apparent satisfaction. The patient knows to call the clinic with any problems, questions or concerns.  I spent 15 minutes counseling the patient face to face. The total time spent in the appointment was 20 minutes and more than 50% was on counseling and direct patient cares.    Sullivan Lone MD [place] AAHIVMS [facility] Hematology/Oncology Physician [facility]  (Office):       [phone] (Work cell):  [phone] (Fax):           [phone]

## 2015-06-21 ENCOUNTER — Other Ambulatory Visit: Payer: Self-pay | Admitting: Cardiovascular Disease

## 2015-06-21 ENCOUNTER — Other Ambulatory Visit: Payer: Self-pay | Admitting: Nurse Practitioner

## 2015-06-21 DIAGNOSIS — D649 Anemia, unspecified: Secondary | ICD-10-CM

## 2015-06-21 NOTE — Telephone Encounter (Signed)
REFILL 

## 2015-07-27 ENCOUNTER — Other Ambulatory Visit: Payer: Self-pay | Admitting: Cardiovascular Disease

## 2015-07-27 DIAGNOSIS — I6523 Occlusion and stenosis of bilateral carotid arteries: Secondary | ICD-10-CM

## 2015-08-13 ENCOUNTER — Ambulatory Visit (HOSPITAL_COMMUNITY)
Admission: RE | Admit: 2015-08-13 | Discharge: 2015-08-13 | Disposition: A | Discharge status: Home / Self Care | Payer: Medicare Other | Source: Ambulatory Visit | Attending: Cardiovascular Disease | Admitting: Cardiovascular Disease

## 2015-08-13 DIAGNOSIS — I6523 Occlusion and stenosis of bilateral carotid arteries: Secondary | ICD-10-CM | POA: Diagnosis not present

## 2015-08-13 DIAGNOSIS — E785 Hyperlipidemia, unspecified: Secondary | ICD-10-CM | POA: Diagnosis not present

## 2015-08-13 DIAGNOSIS — I6502 Occlusion and stenosis of left vertebral artery: Secondary | ICD-10-CM | POA: Insufficient documentation

## 2015-08-13 DIAGNOSIS — I10 Essential (primary) hypertension: Secondary | ICD-10-CM | POA: Insufficient documentation

## 2015-08-14 ENCOUNTER — Other Ambulatory Visit: Payer: Self-pay | Admitting: *Deleted

## 2015-08-14 DIAGNOSIS — I739 Peripheral vascular disease, unspecified: Secondary | ICD-10-CM

## 2015-09-20 ENCOUNTER — Other Ambulatory Visit: Payer: Self-pay | Admitting: Cardiovascular Disease

## 2015-11-12 ENCOUNTER — Other Ambulatory Visit: Payer: Self-pay | Admitting: Internal Medicine

## 2015-11-12 ENCOUNTER — Ambulatory Visit
Admission: RE | Admit: 2015-11-12 | Discharge: 2015-11-12 | Disposition: A | Discharge status: Home / Self Care | Payer: Medicare Other | Source: Ambulatory Visit | Attending: Internal Medicine | Admitting: Internal Medicine

## 2015-11-12 DIAGNOSIS — M25561 Pain in right knee: Secondary | ICD-10-CM

## 2015-11-12 DIAGNOSIS — M25562 Pain in left knee: Secondary | ICD-10-CM

## 2015-11-13 ENCOUNTER — Other Ambulatory Visit: Payer: Medicare Other

## 2015-11-22 ENCOUNTER — Other Ambulatory Visit: Payer: Self-pay | Admitting: *Deleted

## 2015-11-22 DIAGNOSIS — D509 Iron deficiency anemia, unspecified: Secondary | ICD-10-CM

## 2015-11-23 ENCOUNTER — Telehealth: Payer: Self-pay | Admitting: Hematology

## 2015-11-23 ENCOUNTER — Other Ambulatory Visit (HOSPITAL_BASED_OUTPATIENT_CLINIC_OR_DEPARTMENT_OTHER): Payer: Medicare Other

## 2015-11-23 ENCOUNTER — Encounter: Payer: Self-pay | Admitting: Hematology

## 2015-11-23 ENCOUNTER — Ambulatory Visit (HOSPITAL_BASED_OUTPATIENT_CLINIC_OR_DEPARTMENT_OTHER): Payer: Medicare Other | Admitting: Hematology

## 2015-11-23 VITALS — BP 159/64 | HR 50 | Temp 98.1°F | Resp 17 | Ht 69.0 in | Wt 162.0 lb

## 2015-11-23 DIAGNOSIS — D509 Iron deficiency anemia, unspecified: Secondary | ICD-10-CM

## 2015-11-23 DIAGNOSIS — E0590 Thyrotoxicosis, unspecified without thyrotoxic crisis or storm: Secondary | ICD-10-CM

## 2015-11-23 LAB — CBC & DIFF AND RETIC
BASO%: 1.7 % (ref 0.0–2.0)
Basophils Absolute: 0.1 10*3/uL (ref 0.0–0.1)
EOS%: 4.5 % (ref 0.0–7.0)
Eosinophils Absolute: 0.3 10*3/uL (ref 0.0–0.5)
HCT: 41 % (ref 38.4–49.9)
HGB: 13.2 g/dL (ref 13.0–17.1)
Immature Retic Fract: 2.6 % — ABNORMAL LOW (ref 3.00–10.60)
LYMPH%: 35.3 % (ref 14.0–49.0)
MCH: 29.2 pg (ref 27.2–33.4)
MCHC: 32.2 g/dL (ref 32.0–36.0)
MCV: 90.7 fL (ref 79.3–98.0)
MONO#: 0.6 10*3/uL (ref 0.1–0.9)
MONO%: 9.5 % (ref 0.0–14.0)
NEUT#: 2.8 10*3/uL (ref 1.5–6.5)
NEUT%: 49 % (ref 39.0–75.0)
Platelets: 175 10*3/uL (ref 140–400)
RBC: 4.52 10*6/uL (ref 4.20–5.82)
RDW: 13.1 % (ref 11.0–14.6)
Retic %: 1.89 % — ABNORMAL HIGH (ref 0.80–1.80)
Retic Ct Abs: 85.43 10*3/uL (ref 34.80–93.90)
WBC: 5.8 10*3/uL (ref 4.0–10.3)
lymph#: 2.1 10*3/uL (ref 0.9–3.3)

## 2015-11-23 LAB — COMPREHENSIVE METABOLIC PANEL
ALT: <9 U/L (ref 0–55)
AST: 19 U/L (ref 5–34)
Albumin: 3.6 g/dL (ref 3.5–5.0)
Alkaline Phosphatase: 107 U/L (ref 40–150)
Anion Gap: 8 mEq/L (ref 3–11)
BUN: 10.7 mg/dL (ref 7.0–26.0)
CO2: 24 mEq/L (ref 22–29)
Calcium: 9.4 mg/dL (ref 8.4–10.4)
Chloride: 111 mEq/L — ABNORMAL HIGH (ref 98–109)
Creatinine: 1.1 mg/dL (ref 0.7–1.3)
EGFR: 80 mL/min/{1.73_m2} — ABNORMAL LOW (ref >90)
Glucose: 97 mg/dl (ref 70–140)
Potassium: 4 mEq/L (ref 3.5–5.1)
Sodium: 143 mEq/L (ref 136–145)
Total Bilirubin: 0.45 mg/dL (ref 0.20–1.20)
Total Protein: 7.3 g/dL (ref 6.4–8.3)

## 2015-11-23 LAB — FERRITIN: Ferritin: 57 ng/ml (ref 22–316)

## 2015-11-23 NOTE — Telephone Encounter (Signed)
Gave patient avs report and appointments for January  °

## 2015-11-23 NOTE — Progress Notes (Signed)
Shawn Fernandez    HEMATOLOGY/ONCOLOGY CLINIC NOTE  Date of Service: .11/23/2015   Patient Care Team: [name], MD as PCP - General (Internal Medicine)   Endocrinologist: Dr. Jacelyn Pi  CHIEF COMPLAINTS/PURPOSE OF CONSULTATION:  Evaluation and management of anemia  DIAGNOSIS  Microcytic hypochromic Anemia with MCV 79.4 due to Iron deficiency. Ferritin 27, Iron sat too low to calculate. No overt evidence of bleeding. Patient notes recent fecal occult blood neg x 1. He has had significant iron deficiency anemia in the past in 2011 with a ferritin level of 9 and required a blood transfusion. EGD showed duodenitis and hiatal hernia. Patient remains at high risk of GI bleeding since he is on ASA+ plavix for his endovascular stent graft for repaired AAA and is on Cilostazole for PAD. Some element of anemia also appears to be temporally related to methimazole use (though agranulocytosis is the usual concern and isolated anemia is less usual) The hyperthyroidism itself can also contribute to the anemia until corrected. SPEP with no M spike No evidence of hemolysis B12/folate WNL  Current treatment -Iron polysaccharide 150mg  po daily for iron maintenance replacement -previously IV feraheme. Continue prn IV feraheme.  HISTORY OF PRESENTING ILLNESS: plz see initial consultation for details on initial presentation  INTERVAL HISTORY  Shawn Fernandez is here for a f/u of his labs and anemia. He notes that his energy levels are much improved. His anemia has resolved. No overt GI bleeding.   MEDICAL HISTORY:  Past Medical History:  Diagnosis Date  . Blood clot in abdominal vein    treated at [facility]  . Carotid artery disease ([place])   . Dyslipidemia   . FHx: heart disease   . History of blood transfusion 8-9 yrs ago  . HTN (hypertension)   . Peripheral vascular disease ([place])    Hyperthyroidism -being followed by Dr. Chalmers Cater - was previously on methimazole for 6-8 months and has been off for a few  weeks now. He was treated with radioactive iodine about one week ago.  He had presented with progressive weight loss from 2 years prior to diagnosis.  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  Previous history of iron deficiency anemia ferritin was 9 about 5 years ago in 2011  SURGICAL HISTORY: Past Surgical History:  Procedure Laterality Date  . ANKLE SURGERY Right yrs ago  . arm surgery Left yrs ago   2 rods inserted, 1 rod later removed  . COLONOSCOPY WITH PROPOFOL N/A 09/16/2013   Procedure: COLONOSCOPY WITH PROPOFOL;  Surgeon: Beryle Beams, MD;  Location: [facility] ENDOSCOPY;  Service: Endoscopy;  Laterality: N/A;  . LOWER EXTREMITY ANGIOGRAM N/A 07/24/2011   Procedure: LOWER EXTREMITY ANGIOGRAM;  Surgeon: Lorretta Harp, MD;  Location: [facility] CATH LAB;  Service: Cardiovascular;  Laterality: N/A;  . stent in abdominal vein  4-5 yrs ago  . US ECHOCARDIOGRAPHY  07-20-2007   EF 55-60%    SOCIAL HISTORY: Social History   Social History  . Marital status: Married    Spouse name: N/A  . Number of children: N/A  . Years of education: N/A   Occupational History  . Not on file.   Social History Main Topics  . Smoking status: Former Smoker    Packs/day: 0.75    Years: 20.00    Types: Cigarettes    Quit date: 07/01/2009  . Smokeless tobacco: Never Used  . Alcohol use Yes     Comment: only occ  . Drug use:  No  . Sexual activity: Not on file   Other Topics Concern  . Not on file   Social History Narrative  . No narrative on file    FAMILY HISTORY: Family History  Problem Relation Age of Onset  . Heart disease Brother   . Heart disease Mother   . Cancer Father     ? type    ALLERGIES:  is allergic to lisinopril.  MEDICATIONS:  Current Outpatient Prescriptions  Medication Sig Dispense Refill  . amLODipine (NORVASC) 10 MG tablet Take 1 tablet (10 mg total) by mouth every morning. 30 tablet 6  . aspirin EC 81 MG  tablet Take 81 mg by mouth daily.    . cilostazol (PLETAL) 50 MG tablet Take 50 mg by mouth every morning.    . clopidogrel (PLAVIX) 75 MG tablet Take 75 mg by mouth daily.  5  . iron polysaccharides (NIFEREX) 150 MG capsule Take 1 capsule (150 mg total) by mouth daily. 30 capsule 5  . losartan (COZAAR) 100 MG tablet Take 100 mg by mouth every morning.     . Multiple Vitamin (MULTIVITAMIN WITH MINERALS) TABS tablet Take 1 tablet by mouth daily.    Shawn Fernandez omeprazole (PRILOSEC) 20 MG capsule Take 20 mg by mouth daily.    . simvastatin (ZOCOR) 20 MG tablet TAKE 1 TABLET BY MOUTH EVERY EVENING. 90 tablet 0   No current facility-administered medications for this visit.     REVIEW OF SYSTEMS:    10 Point review of Systems was done is negative except as noted above.  PHYSICAL EXAMINATION: ECOG PERFORMANCE STATUS: 1 - Symptomatic but completely ambulatory  . Vitals:   11/23/15 0919  BP: (!) 159/64  Pulse: (!) 50  Resp: 17  Temp: 98.1 F (36.7 C)   Filed Weights   11/23/15 0919  Weight: 162 lb (73.5 kg)   .Body mass index is 23.92 kg/m.  GENERAL:alert, in no acute distress and comfortable SKIN: skin color, texture, turgor are normal, no rashes or significant lesions EYES: normal, conjunctiva are pink and non-injected, sclera clear OROPHARYNX:no exudate, no erythema and lips, buccal mucosa, and tongue normal  NECK: supple, no JVD, thyroid normal size, non-tender, without nodularity LYMPH:  no palpable lymphadenopathy in the cervical, axillary or inguinal LUNGS: clear to auscultation with normal respiratory effort HEART: regular rate & rhythm,  no murmurs and no lower extremity edema ABDOMEN: abdomen soft, non-tender, normoactive bowel sounds  Musculoskeletal: no cyanosis of digits and no clubbing  PSYCH: alert & oriented x 3 with fluent speech NEURO: no focal motor/sensory deficits  LABORATORY DATA:  I have reviewed the data as listed  . CBC Latest Ref Rng & Units 11/23/2015  05/24/2015 03/15/2015  WBC 4.0 - 10.3 10e3/uL 5.8 7.2 9.8  Hemoglobin 13.0 - 17.1 g/dL 13.2 11.4(L) 9.4(L)  Hematocrit 38.4 - 49.9 % 41.0 35.7(L) 31.3(L)  Platelets 140 - 400 10e3/uL 175 208 360   . CBC    Component Value Date/Time   WBC 5.8 11/23/2015 0904   WBC 8.0 06/29/2014 1231   RBC 4.52 11/23/2015 0904   RBC 4.76 06/29/2014 1231   HGB 13.2 11/23/2015 0904   HCT 41.0 11/23/2015 0904   PLT 175 11/23/2015 0904   MCV 90.7 11/23/2015 0904   MCH 29.2 11/23/2015 0904   MCH 27.6 01/25/2012 1521   MCHC 32.2 11/23/2015 0904   MCHC 32.5 06/29/2014 1231   RDW 13.1 11/23/2015 0904   LYMPHSABS 2.1 11/23/2015 0904   MONOABS 0.6 11/23/2015 0904  EOSABS 0.3 11/23/2015 0904   BASOSABS 0.1 11/23/2015 0904    CMP Latest Ref Rng & Units 11/23/2015 05/24/2015 03/15/2015  Glucose 70 - 140 mg/dl 97 88 88  BUN 7.0 - 26.0 mg/dL 10.7 12.2 10.1  Creatinine 0.7 - 1.3 mg/dL 1.1 1.1 0.9  Sodium 136 - 145 mEq/L 143 142 142  Potassium 3.5 - 5.1 mEq/L 4.0 4.0 3.4(L)  Chloride 98 - 110 mmol/L - - -  CO2 22 - 29 mEq/L 24 28 26   Calcium 8.4 - 10.4 mg/dL 9.4 9.0 9.1  Total Protein 6.4 - 8.3 g/dL 7.3 7.0 7.2  Total Bilirubin 0.20 - 1.20 mg/dL 0.45 0.36 0.55  Alkaline Phos 40 - 150 U/L 107 102 124  AST 5 - 34 U/L 19 20 17   ALT 0 - 55 U/L 9 9 12    . Lab Results  Component Value Date   IRON 93 05/24/2015   TIBC 258 05/24/2015   IRONPCTSAT 36 05/24/2015   (Iron and TIBC)  Lab Results  Component Value Date   FERRITIN 57 11/23/2015   Component     Latest Ref Rng 03/15/2015  IgG, Qn, Serum     700 - 1600 mg/dL 1,044  IgA, Qn, Serum     61 - 437 mg/dL 213  IgM, Qn, Serum     20 - 172 mg/dL 68  Total Protein     6.0 - 8.5 g/dL 6.7  Albumin SerPl Elph-Mcnc     2.9 - 4.4 g/dL 3.5  Alpha 1     0.0 - 0.4 g/dL 0.3  Alpha2 Glob SerPl Elph-Mcnc     0.4 - 1.0 g/dL 0.8  B-Globulin SerPl Elph-Mcnc     0.7 - 1.3 g/dL 1.1  Gamma Glob SerPl Elph-Mcnc     0.4 - 1.8 g/dL 1.1  M Protein SerPl  Elph-Mcnc     Not Observed g/dL Not Observed  Globulin, Total     2.2 - 3.9 g/dL 3.2  Albumin/Glob SerPl     0.7 - 1.7 1.1  IFE 1      Comment  Please Note (HCV):      Comment  Folate, Hemolysate     Not Estab. ng/mL 557.3 (Revised)  HCT     37.5 - 51.0 % 32.5 (Revised) (L)  Folate, RBC     >498 ng/mL 1,715 (Revised)  Ig Kappa Free Light Chain     3.30 - 19.40 mg/L 14.52  Ig Lambda Free Light Chain     5.71 - 26.30 mg/L 16.37  Kappa/Lambda FluidC Ratio     0.26 - 1.65 0.89  LDH     125 - 245 U/L 137  Haptoglobin     34 - 200 mg/dL 184  VITAMIN B12     211 - 946 pg/mL 844     RADIOGRAPHIC STUDIES: I have personally reviewed the radiological images as listed and agreed with the findings in the report. Dg Knee Complete 4 Views Left  Result Date: 11/12/2015 CLINICAL DATA:  Chronic bilateral knee pain, left greater than right EXAM: LEFT KNEE - COMPLETE 4+ VIEW COMPARISON:  None. FINDINGS: No fracture or dislocation is seen. Joint spaces are essentially preserved. Tiny patellofemoral osteophytes. No definite suprapatellar knee joint effusion. Vascular calcifications. IMPRESSION: No acute osseus abnormality is seen. Electronically Signed   By: Julian Hy M.D.   On: 11/12/2015 15:46   Dg Knee Complete 4 Views Right  Result Date: 11/12/2015 CLINICAL DATA:  Arthralgia of both knees. EXAM: RIGHT KNEE -  COMPLETE 4+ VIEW COMPARISON:  None. FINDINGS: No evidence of fracture, dislocation, or joint effusion. No notable spurring or degenerative joint narrowing. No erosive changes. Atherosclerotic calcification. IMPRESSION: 1. No focal finding or degenerative joint narrowing. 2. Atherosclerosis. Electronically Signed   By: Monte Fantasia M.D.   On: 11/12/2015 15:46    ASSESSMENT & PLAN:   70 yo AAM with   1) Microcytic hypochromic Anemia with MCV 79.4 due to Iron deficiency. Ferritin 27, Iron sat too low to calculate. No overt evidence of bleeding. Patient notes recent fecal  occult blood neg x 1. He has had significant iron deficiency anemia in the past in 2011 with a ferritin level of 9 and required a blood transfusion. EGD showed duodenitis and hiatal hernia. Patient remains at high risk of GI bleeding since he is on ASA+ plavix for his endovascular stent graft for repaired AAA and is on Cilostazole for PAD. Some element of anemia also appears to be temporally related to methimazole use (though agranulocytosis is the usual concern and isolated anemia is less usual) The hyperthyroidism itself can also contribute to the anemia until corrected. SPEP with no M spike No evidence of hemolysis B12/folate WNL Plan -Patient's anemia has now resolved. Hemoglobin is 13.2. With normocytic MCV of 90. -No indication for additional IV Feraheme at this time -Continue oral iron polysaccharide 150 mg by mouth daily to try to get a ferritin level of 100. --Continues to be on multiple antiplatelet agents with high risk of recurrent GI bleeding. This is to be monitored by his primary care physician. -endocrinology following him for management of hyperthyroidism.  -RTC with Dr Irene Limbo in 4-6 months with rpt cbc, cmp and ferritin. Earlier if any new concerns. His his hemoglobin and iron levels are stable at that time we can discharge him back to his primary care physician.  Continue f/u with PCP. Shawn Fernandez No orders of the defined types were placed in this encounter.  All of the patients questions were answered  To his apparent satisfaction. The patient knows to call the clinic with any problems, questions or concerns.  I spent 15 minutes counseling the patient face to face. The total time spent in the appointment was 20 minutes and more than 50% was on counseling and direct patient cares.    Sullivan Lone MD [place] AAHIVMS [facility] Hematology/Oncology Physician [facility]  (Office):       [phone] (Work cell):  [phone] (Fax):           [phone]

## 2016-03-21 ENCOUNTER — Other Ambulatory Visit (HOSPITAL_BASED_OUTPATIENT_CLINIC_OR_DEPARTMENT_OTHER): Payer: Medicare Other

## 2016-03-21 ENCOUNTER — Ambulatory Visit (HOSPITAL_BASED_OUTPATIENT_CLINIC_OR_DEPARTMENT_OTHER): Payer: Medicare Other | Admitting: Hematology

## 2016-03-21 ENCOUNTER — Encounter: Payer: Self-pay | Admitting: Hematology

## 2016-03-21 VITALS — BP 169/84 | HR 56 | Temp 98.2°F | Resp 17 | Ht 69.0 in | Wt 155.9 lb

## 2016-03-21 DIAGNOSIS — D500 Iron deficiency anemia secondary to blood loss (chronic): Secondary | ICD-10-CM

## 2016-03-21 DIAGNOSIS — D509 Iron deficiency anemia, unspecified: Secondary | ICD-10-CM

## 2016-03-21 DIAGNOSIS — E0590 Thyrotoxicosis, unspecified without thyrotoxic crisis or storm: Secondary | ICD-10-CM | POA: Diagnosis not present

## 2016-03-21 LAB — COMPREHENSIVE METABOLIC PANEL
ALT: <6 U/L (ref 0–55)
AST: 20 U/L (ref 5–34)
Albumin: 3.7 g/dL (ref 3.5–5.0)
Alkaline Phosphatase: 99 U/L (ref 40–150)
Anion Gap: 9 mEq/L (ref 3–11)
BUN: 7 mg/dL (ref 7.0–26.0)
CO2: 28 mEq/L (ref 22–29)
Calcium: 9.8 mg/dL (ref 8.4–10.4)
Chloride: 103 mEq/L (ref 98–109)
Creatinine: 1.1 mg/dL (ref 0.7–1.3)
EGFR: 79 mL/min/{1.73_m2} — ABNORMAL LOW (ref >90)
Glucose: 94 mg/dl (ref 70–140)
Potassium: 3.3 mEq/L — ABNORMAL LOW (ref 3.5–5.1)
Sodium: 141 mEq/L (ref 136–145)
Total Bilirubin: 0.97 mg/dL (ref 0.20–1.20)
Total Protein: 8.2 g/dL (ref 6.4–8.3)

## 2016-03-21 LAB — CBC & DIFF AND RETIC
BASO%: 1.5 % (ref 0.0–2.0)
Basophils Absolute: 0.1 10*3/uL (ref 0.0–0.1)
EOS%: 5 % (ref 0.0–7.0)
Eosinophils Absolute: 0.3 10*3/uL (ref 0.0–0.5)
HCT: 43.1 % (ref 38.4–49.9)
HGB: 13.9 g/dL (ref 13.0–17.1)
Immature Retic Fract: 6.5 % (ref 3.00–10.60)
LYMPH%: 36.5 % (ref 14.0–49.0)
MCH: 28.8 pg (ref 27.2–33.4)
MCHC: 32.3 g/dL (ref 32.0–36.0)
MCV: 89.2 fL (ref 79.3–98.0)
MONO#: 0.8 10*3/uL (ref 0.1–0.9)
MONO%: 11.1 % (ref 0.0–14.0)
NEUT#: 3.2 10*3/uL (ref 1.5–6.5)
NEUT%: 45.9 % (ref 39.0–75.0)
Platelets: 302 10*3/uL (ref 140–400)
RBC: 4.83 10*6/uL (ref 4.20–5.82)
RDW: 13.1 % (ref 11.0–14.6)
Retic %: 2.24 % — ABNORMAL HIGH (ref 0.80–1.80)
Retic Ct Abs: 108.19 10*3/uL — ABNORMAL HIGH (ref 34.80–93.90)
WBC: 6.9 10*3/uL (ref 4.0–10.3)
lymph#: 2.5 10*3/uL (ref 0.9–3.3)

## 2016-03-21 LAB — IRON AND TIBC
%SAT: 40 % (ref 20–55)
Iron: 109 ug/dL (ref 42–163)
TIBC: 270 ug/dL (ref 202–409)
UIBC: 161 ug/dL (ref 117–376)

## 2016-03-21 LAB — FERRITIN: Ferritin: 122 ng/ml (ref 22–316)

## 2016-03-23 NOTE — Progress Notes (Signed)
Marland Kitchen    HEMATOLOGY/ONCOLOGY CLINIC NOTE  Date of Service: .03/21/2016   Patient Care Team: [name], MD as PCP - General (Internal Medicine)   Endocrinologist: Dr. Jacelyn Pi  CHIEF COMPLAINTS/PURPOSE OF CONSULTATION:  F/u for IDA  DIAGNOSIS  Microcytic hypochromic Anemia with MCV 79.4 due to Iron deficiency. Ferritin 27, Iron sat too low to calculate. No overt evidence of bleeding. Patient notes recent fecal occult blood neg x 1. He has had significant iron deficiency anemia in the past in 2011 with a ferritin level of 9 and required a blood transfusion. EGD showed duodenitis and hiatal hernia. Patient remains at high risk of GI bleeding since he is on ASA+ plavix for his endovascular stent graft for repaired AAA and is on Cilostazole for PAD. Some element of anemia also appears to be temporally related to methimazole use (though agranulocytosis is the usual concern and isolated anemia is less usual) The hyperthyroidism itself can also contribute to the anemia until corrected. SPEP with no M spike No evidence of hemolysis B12/folate WNL  Current treatment -Iron polysaccharide 150mg  po daily for iron maintenance replacement -previously IV feraheme. Continue prn IV feraheme.  HISTORY OF PRESENTING ILLNESS: plz see initial consultation for details on initial presentation  INTERVAL HISTORY  Shawn Fernandez is here for a f/u of his labs and anemia. He notes no overt GI bleeding. Energy levels have been good in general except in the last 1-2 weeks since he has had a viral URI which is now resolving. No other acute new concerns. Hgb today is WNL at 13.9.  MEDICAL HISTORY:  Past Medical History:  Diagnosis Date  . Blood clot in abdominal vein    treated at [facility]  . Carotid artery disease ([place])   . Dyslipidemia   . FHx: heart disease   . History of blood transfusion 8-9 yrs ago  . HTN (hypertension)   . Peripheral vascular disease ([place])    Hyperthyroidism -being followed  by Dr. Chalmers Cater - was previously on methimazole for 6-8 months and has been off for a few weeks now. He was treated with radioactive iodine about one week ago.  He had presented with progressive weight loss from 2 years prior to diagnosis.  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  Previous history of iron deficiency anemia ferritin was 9 about 5 years ago in 2011  SURGICAL HISTORY: Past Surgical History:  Procedure Laterality Date  . ANKLE SURGERY Right yrs ago  . arm surgery Left yrs ago   2 rods inserted, 1 rod later removed  . COLONOSCOPY WITH PROPOFOL N/A 09/16/2013   Procedure: COLONOSCOPY WITH PROPOFOL;  Surgeon: Beryle Beams, MD;  Location: [facility] ENDOSCOPY;  Service: Endoscopy;  Laterality: N/A;  . LOWER EXTREMITY ANGIOGRAM N/A 07/24/2011   Procedure: LOWER EXTREMITY ANGIOGRAM;  Surgeon: Lorretta Harp, MD;  Location: [facility] CATH LAB;  Service: Cardiovascular;  Laterality: N/A;  . stent in abdominal vein  4-5 yrs ago  . US ECHOCARDIOGRAPHY  07-20-2007   EF 55-60%    SOCIAL HISTORY: Social History   Social History  . Marital status: Married    Spouse name: N/A  . Number of children: N/A  . Years of education: N/A   Occupational History  . Not on file.   Social History Main Topics  . Smoking status: Former Smoker    Packs/day: 0.75    Years: 20.00    Types: Cigarettes    Quit date: 07/01/2009  .  Smokeless tobacco: Never Used  . Alcohol use Yes     Comment: only occ  . Drug use: No  . Sexual activity: Not on file   Other Topics Concern  . Not on file   Social History Narrative  . No narrative on file    FAMILY HISTORY: Family History  Problem Relation Age of Onset  . Heart disease Brother   . Heart disease Mother   . Cancer Father     ? type    ALLERGIES:  is allergic to lisinopril.  MEDICATIONS:  Current Outpatient Prescriptions  Medication Sig Dispense Refill  . amLODipine (NORVASC) 10 MG tablet  Take 1 tablet (10 mg total) by mouth every morning. 30 tablet 6  . aspirin EC 81 MG tablet Take 81 mg by mouth daily.    . cilostazol (PLETAL) 50 MG tablet Take 50 mg by mouth every morning.    . clopidogrel (PLAVIX) 75 MG tablet Take 75 mg by mouth daily.  5  . iron polysaccharides (NIFEREX) 150 MG capsule Take 1 capsule (150 mg total) by mouth daily. 30 capsule 5  . levothyroxine (SYNTHROID, LEVOTHROID) 75 MCG tablet     . losartan (COZAAR) 100 MG tablet Take 100 mg by mouth every morning.     . Multiple Vitamin (MULTIVITAMIN WITH MINERALS) TABS tablet Take 1 tablet by mouth daily.    Marland Kitchen omeprazole (PRILOSEC) 20 MG capsule Take 20 mg by mouth daily.    . simvastatin (ZOCOR) 20 MG tablet TAKE 1 TABLET BY MOUTH EVERY EVENING. 90 tablet 0   No current facility-administered medications for this visit.     REVIEW OF SYSTEMS:    10 Point review of Systems was done is negative except as noted above.  PHYSICAL EXAMINATION: ECOG PERFORMANCE STATUS: 1 - Symptomatic but completely ambulatory  . Vitals:   03/21/16 0938  BP: (!) 169/84  Pulse: (!) 56  Resp: 17  Temp: 98.2 F (36.8 C)   Filed Weights   03/21/16 0938  Weight: 155 lb 14.4 oz (70.7 kg)   .Body mass index is 23.02 kg/m.  GENERAL:alert, in no acute distress and comfortable SKIN: skin color, texture, turgor are normal, no rashes or significant lesions EYES: normal, conjunctiva are pink and non-injected, sclera clear OROPHARYNX:no exudate, no erythema and lips, buccal mucosa, and tongue normal  NECK: supple, no JVD, thyroid normal size, non-tender, without nodularity LYMPH:  no palpable lymphadenopathy in the cervical, axillary or inguinal LUNGS: clear to auscultation with normal respiratory effort HEART: regular rate & rhythm,  no murmurs and no lower extremity edema ABDOMEN: abdomen soft, non-tender, normoactive bowel sounds  Musculoskeletal: no cyanosis of digits and no clubbing  PSYCH: alert & oriented x 3 with fluent  speech NEURO: no focal motor/sensory deficits  LABORATORY DATA:  I have reviewed the data as listed  . CBC Latest Ref Rng & Units 03/21/2016 11/23/2015 05/24/2015  WBC 4.0 - 10.3 10e3/uL 6.9 5.8 7.2  Hemoglobin 13.0 - 17.1 g/dL 13.9 13.2 11.4(L)  Hematocrit 38.4 - 49.9 % 43.1 41.0 35.7(L)  Platelets 140 - 400 10e3/uL 302 175 208   . CBC    Component Value Date/Time   WBC 6.9 03/21/2016 0919   WBC 8.0 06/29/2014 1231   RBC 4.83 03/21/2016 0919   RBC 4.76 06/29/2014 1231   HGB 13.9 03/21/2016 0919   HCT 43.1 03/21/2016 0919   PLT 302 03/21/2016 0919   MCV 89.2 03/21/2016 0919   MCH 28.8 03/21/2016 0919   MCH  27.6 01/25/2012 1521   MCHC 32.3 03/21/2016 0919   MCHC 32.5 06/29/2014 1231   RDW 13.1 03/21/2016 0919   LYMPHSABS 2.5 03/21/2016 0919   MONOABS 0.8 03/21/2016 0919   EOSABS 0.3 03/21/2016 0919   BASOSABS 0.1 03/21/2016 0919    CMP Latest Ref Rng & Units 03/21/2016 11/23/2015 05/24/2015  Glucose 70 - 140 mg/dl 94 97 88  BUN 7.0 - 26.0 mg/dL 7.0 10.7 12.2  Creatinine 0.7 - 1.3 mg/dL 1.1 1.1 1.1  Sodium 136 - 145 mEq/L 141 143 142  Potassium 3.5 - 5.1 mEq/L 3.3(L) 4.0 4.0  Chloride 98 - 110 mmol/L - - -  CO2 22 - 29 mEq/L 28 24 28   Calcium 8.4 - 10.4 mg/dL 9.8 9.4 9.0  Total Protein 6.4 - 8.3 g/dL 8.2 7.3 7.0  Total Bilirubin 0.20 - 1.20 mg/dL 0.97 0.45 0.36  Alkaline Phos 40 - 150 U/L 99 107 102  AST 5 - 34 U/L 20 19 20   ALT 0-55 U/L U/L <6 <9 9   . Lab Results  Component Value Date   IRON 109 03/21/2016   TIBC 270 03/21/2016   IRONPCTSAT 40 03/21/2016   (Iron and TIBC)  Lab Results  Component Value Date   FERRITIN 122 03/21/2016    RADIOGRAPHIC STUDIES: I have personally reviewed the radiological images as listed and agreed with the findings in the report. No results found.  ASSESSMENT & PLAN:   71 yo AAM with   1) Microcytic hypochromic Anemia due to Iron deficiency - now resolved.  Previously had Ferritin 27, Iron sat too low to calculate. No  overt evidence of bleeding. Patient notes recent fecal occult blood neg x 1. He has had significant iron deficiency anemia in the past in 2011 with a ferritin level of 9 and required a blood transfusion. EGD showed duodenitis and hiatal hernia. Patient remains at high risk of GI bleeding since he is on ASA+ plavix for his endovascular stent graft for repaired AAA and is on Cilostazole for PAD. Some element of anemia also appears to be temporally related to methimazole use (though agranulocytosis is the usual concern and isolated anemia is less usual) The hyperthyroidism itself can also contribute to the anemia until corrected. SPEP with no M spike No evidence of hemolysis B12/folate WNL Plan -Patient's anemia remains resolved. Hemoglobin is 13.9 with normocytic MCV. -No indication for additional IV Feraheme at this time -Continue oral iron polysaccharide 150 mg by mouth daily to try to get a ferritin level of 100. --Continues to be on multiple antiplatelet agents with high risk of recurrent GI bleeding. This is to be monitored by his primary care physician. --rpr cbc and Iron studies with PCP in 3 months and adjust further po iron replacement as per levels as per PCP. -endocrinology following him for management of hyperthyroidism.  Will discharge patient to the care of his PCP. (.[name], MD) Reconsult [place] on an as needed basis  No orders of the defined types were placed in this encounter.  All of the patients questions were answered  To his apparent satisfaction. The patient knows to call the clinic with any problems, questions or concerns.  I spent 15 minutes counseling the patient face to face. The total time spent in the appointment was 15 minutes and more than 50% was on counseling and direct patient cares.    Sullivan Lone MD [place] AAHIVMS [facility] Hematology/Oncology Physician [place]  (Office):  [phone] (Work cell):  [phone] (Fax):            [phone]

## 2016-06-03 ENCOUNTER — Other Ambulatory Visit: Payer: Self-pay | Admitting: Cardiovascular Disease

## 2016-06-03 NOTE — Telephone Encounter (Signed)
Rx request sent to pharmacy.  

## 2016-08-27 ENCOUNTER — Ambulatory Visit (HOSPITAL_COMMUNITY)
Admission: RE | Admit: 2016-08-27 | Discharge: 2016-08-27 | Disposition: A | Discharge status: Home / Self Care | Payer: Medicare Other | Source: Ambulatory Visit | Attending: Internal Medicine | Admitting: Internal Medicine

## 2016-08-27 ENCOUNTER — Other Ambulatory Visit: Payer: Self-pay | Admitting: Cardiovascular Disease

## 2016-08-27 DIAGNOSIS — I6523 Occlusion and stenosis of bilateral carotid arteries: Secondary | ICD-10-CM

## 2016-09-01 ENCOUNTER — Other Ambulatory Visit: Payer: Self-pay | Admitting: Cardiovascular Disease

## 2016-09-01 DIAGNOSIS — I779 Disorder of arteries and arterioles, unspecified: Secondary | ICD-10-CM

## 2016-09-01 DIAGNOSIS — I739 Peripheral vascular disease, unspecified: Principal | ICD-10-CM

## 2017-05-23 ENCOUNTER — Ambulatory Visit (HOSPITAL_COMMUNITY)
Admission: EM | Admit: 2017-05-23 | Discharge: 2017-05-23 | Disposition: A | Discharge status: Home / Self Care | Payer: Medicare Other | Attending: Family Medicine | Admitting: Family Medicine

## 2017-05-23 ENCOUNTER — Encounter (HOSPITAL_COMMUNITY): Payer: Self-pay | Admitting: Emergency Medicine

## 2017-05-23 DIAGNOSIS — J22 Unspecified acute lower respiratory infection: Secondary | ICD-10-CM | POA: Diagnosis not present

## 2017-05-23 MED ORDER — PREDNISONE 20 MG PO TABS: 40.0000 mg | ORAL_TABLET | Freq: Every day | ORAL | 0 refills | Status: AC

## 2017-05-23 MED ORDER — AZITHROMYCIN 250 MG PO TABS: ORAL_TABLET | ORAL | 0 refills | Status: AC

## 2017-05-23 NOTE — ED Triage Notes (Signed)
Patient started symptoms 2-3 weeks ago.  Symptoms consist of coughing, sneezing, runny nose, throat congestion

## 2017-05-23 NOTE — ED Provider Notes (Signed)
[place]    CSN: [id number] Arrival date & time: 05/23/17  1351     History   Chief Complaint Chief Complaint  Patient presents with  . URI    HPI Shawn Fernandez is a 72 y.o. male.   Shawn Fernandez presents with his wife with complaints of cough and congestion which have been persistent for the past 2.5 weeks. Started to improve but then worsened again. Cough is worse at night and keeps him up. Denies any pain or fevers. Denies sore throat or ear pain. Denies gi/gu complaints. Takes daily antihistamine for allergies. Hx of htn, PAD, CAD, cough, anemia, blood clot.    ROS per HPI.      Past Medical History:  Diagnosis Date  . Blood clot in abdominal vein    treated at [facility]  . Carotid artery disease ([place])   . Dyslipidemia   . FHx: heart disease   . History of blood transfusion 8-9 yrs ago  . HTN (hypertension)   . Peripheral vascular disease [facility])     Patient Active Problem List   Diagnosis Date Noted  . Iron deficiency anemia due to chronic blood loss 03/29/2015  . Anemia 03/15/2015  . Hyperthyroidism 01/04/2015  . Claudication ([place]) 06/29/2014  . Essential hypertension 10/27/2012  . Hyperlipidemia 10/27/2012  . Peripheral arterial disease ([place]) 10/27/2012  . Carotid artery disease ([place]) 10/27/2012  . Cough 11/06/2011    Past Surgical History:  Procedure Laterality Date  . ANKLE SURGERY Right yrs ago  . arm surgery Left yrs ago   2 rods inserted, 1 rod later removed  . COLONOSCOPY WITH PROPOFOL N/A 09/16/2013   Procedure: COLONOSCOPY WITH PROPOFOL;  Surgeon: Beryle Beams, MD;  Location: [facility] ENDOSCOPY;  Service: Endoscopy;  Laterality: N/A;  . LOWER EXTREMITY ANGIOGRAM N/A 07/24/2011   Procedure: LOWER EXTREMITY ANGIOGRAM;  Surgeon: Lorretta Harp, MD;  Location: [facility] CATH LAB;  Service: Cardiovascular;  Laterality: N/A;  . stent in abdominal vein  4-5 yrs ago  . US ECHOCARDIOGRAPHY  07-20-2007   EF 55-60%       Home Medications    Prior  to Admission medications   Medication Sig Start Date End Date Taking? Authorizing Provider  amLODipine (NORVASC) 10 MG tablet Take 1 tablet (10 mg total) by mouth every morning. 01/24/15   Lorretta Harp, MD  aspirin EC 81 MG tablet Take 81 mg by mouth daily.    [provider]  azithromycin (ZITHROMAX) 250 MG tablet Take 2 tablets (500 mg total) by mouth daily for 1 day, THEN 1 tablet (250 mg total) daily for 4 days. 05/23/17 05/28/17  Zigmund Gottron, NP  cilostazol (PLETAL) 50 MG tablet Take 50 mg by mouth every morning.    [provider]  clopidogrel (PLAVIX) 75 MG tablet Take 75 mg by mouth daily. 06/06/14   [provider]  iron polysaccharides (NIFEREX) 150 MG capsule Take 1 capsule (150 mg total) by mouth daily. 05/24/15   Brunetta Genera, MD  levothyroxine (SYNTHROID, LEVOTHROID) 75 MCG tablet  01/24/16   [provider]  losartan (COZAAR) 100 MG tablet Take 100 mg by mouth every morning.     [provider]  Multiple Vitamin (MULTIVITAMIN WITH MINERALS) TABS tablet Take 1 tablet by mouth daily.    [provider]  omeprazole (PRILOSEC) 20 MG capsule Take 20 mg by mouth daily.    [provider]  predniSONE (DELTASONE) 20 MG tablet Take 2 tablets (40 mg  total) by mouth daily with breakfast for 5 days. 05/23/17 05/28/17  Zigmund Gottron, NP  simvastatin (ZOCOR) 20 MG tablet Take 1 tablet (20 mg total) by mouth every evening. Please schedule appointment for refills. 06/03/16   Lorretta Harp, MD    Family History Family History  Problem Relation Age of Onset  . Heart disease Brother   . Heart disease Mother   . Cancer Father        ? type    Social History Social History   Tobacco Use  . Smoking status: Former Smoker    Packs/day: 0.75    Years: 20.00    Pack years: 15.00    Types: Cigarettes    Last attempt to quit: 07/01/2009    Years since quitting: 7.8  . Smokeless tobacco: Never Used  Substance Use  Topics  . Alcohol use: Yes    Comment: only occ  . Drug use: No     Allergies   Lisinopril   Review of Systems Review of Systems   Physical Exam Triage Vital Signs ED Triage Vitals [05/23/17 1450]  Enc Vitals Group     BP (!) 171/74     Pulse Rate (!) 59     Resp 18     Temp 98.6 F (37 C)     Temp Source Oral     SpO2 100 %     Weight      Height      Head Circumference      Peak Flow      Pain Score 0     Pain Loc      Pain Edu?      Excl. in [place]?    No data found.  Updated Vital Signs BP (!) 171/74 (BP Location: Right Arm)   Pulse (!) 59   Temp 98.6 F (37 C) (Oral)   Resp 18   SpO2 100%   Visual Acuity Right Eye Distance:   Left Eye Distance:   Bilateral Distance:    Right Eye Near:   Left Eye Near:    Bilateral Near:     Physical Exam  Constitutional: He is oriented to person, place, and time. He appears well-developed and well-nourished.  HENT:  Head: Normocephalic and atraumatic.  Right Ear: Tympanic membrane, external ear and ear canal normal.  Left Ear: Tympanic membrane, external ear and ear canal normal.  Nose: Rhinorrhea present. Right sinus exhibits no maxillary sinus tenderness and no frontal sinus tenderness. Left sinus exhibits no maxillary sinus tenderness and no frontal sinus tenderness.  Mouth/Throat: Uvula is midline, oropharynx is clear and moist and mucous membranes are normal.  Eyes: Pupils are equal, round, and reactive to light. Conjunctivae are normal.  Neck: Normal range of motion.  Cardiovascular: Normal rate and regular rhythm.  Pulmonary/Chest: Effort normal and breath sounds normal.  Without cough throughout exam   Lymphadenopathy:    He has no cervical adenopathy.  Neurological: He is alert and oriented to person, place, and time.  Skin: Skin is warm and dry.  Vitals reviewed.    UC Treatments / Results  Labs (all labs ordered are listed, but only abnormal results are displayed) Labs Reviewed - No data to  display  EKG None Radiology No results found.  Procedures Procedures (including critical care time)  Medications Ordered in UC Medications - No data to display   Initial Impression / Assessment and Plan / UC Course  I have reviewed the triage vital signs and  the nursing notes.  Pertinent labs & imaging results that were available during my care of the patient were reviewed by me and considered in my medical decision making (see chart for details).     Non toxic in appearance. Afebrile. Without tachypnea, tachycardia, hypoxia. Persistent cough, opted to provide coverage with azithromycin at this time, 5 days of prednisone. Return precautions provided. If symptoms worsen or do not improve in the next week to return to be seen or to follow up with PCP.  Patient verbalized understanding and agreeable to plan.    Final Clinical Impressions(s) / UC Diagnoses   Final diagnoses:  Lower respiratory tract infection    ED Discharge Orders        Ordered    predniSONE (DELTASONE) 20 MG tablet  Daily with breakfast     05/23/17 1515    azithromycin (ZITHROMAX) 250 MG tablet     05/23/17 1515       Controlled Substance Prescriptions [place] Controlled Substance Registry consulted? Not Applicable   Zigmund Gottron, NP 05/23/17 1521

## 2017-05-23 NOTE — Discharge Instructions (Addendum)
Push fluids to ensure adequate hydration and keep secretions thin.  Tylenol and/or ibuprofen as needed for pain or fevers.  Complete course of antibiotics.  5 days of prednisone. Continue to decrease to quit smoking. Continue with previously prescribed medications. Please follow up with your primary care doctor for recheck in 1-2 weeks. Return sooner or go to ER if worsening of symptoms.

## 2017-08-24 ENCOUNTER — Other Ambulatory Visit: Payer: Self-pay | Admitting: Cardiovascular Disease

## 2017-08-24 DIAGNOSIS — I6523 Occlusion and stenosis of bilateral carotid arteries: Secondary | ICD-10-CM

## 2017-08-28 ENCOUNTER — Inpatient Hospital Stay (HOSPITAL_COMMUNITY): Admission: RE | Admit: 2017-08-28 | Payer: Medicare Other | Source: Ambulatory Visit

## 2017-09-04 ENCOUNTER — Ambulatory Visit (HOSPITAL_COMMUNITY)
Admission: RE | Admit: 2017-09-04 | Discharge: 2017-09-04 | Disposition: A | Discharge status: Home / Self Care | Payer: Medicare Other | Source: Ambulatory Visit | Attending: Cardiovascular Disease | Admitting: Cardiovascular Disease

## 2017-09-04 ENCOUNTER — Other Ambulatory Visit: Payer: Self-pay | Admitting: *Deleted

## 2017-09-04 DIAGNOSIS — I6523 Occlusion and stenosis of bilateral carotid arteries: Secondary | ICD-10-CM

## 2017-11-04 DIAGNOSIS — Z79899 Other long term (current) drug therapy: Secondary | ICD-10-CM | POA: Diagnosis not present

## 2017-11-04 DIAGNOSIS — E039 Hypothyroidism, unspecified: Secondary | ICD-10-CM | POA: Diagnosis not present

## 2017-11-04 DIAGNOSIS — I10 Essential (primary) hypertension: Secondary | ICD-10-CM | POA: Diagnosis not present

## 2017-11-04 LAB — HEPATIC FUNCTION PANEL
ALT: 5 — AB (ref 10–40)
AST: 16 (ref 14–40)
Alkaline Phosphatase: 99 (ref 25–125)
Bilirubin, Total: 0.8

## 2017-11-04 LAB — BASIC METABOLIC PANEL
BUN: 8 (ref 4–21)
Creatinine: 1.1 (ref 0.6–1.3)
Glucose: 83
Potassium: 4.4 (ref 3.4–5.3)
Sodium: 145 (ref 137–147)

## 2017-11-04 LAB — TSH: TSH: 0.16 — AB (ref 0.41–5.90)

## 2017-11-21 ENCOUNTER — Encounter: Payer: Self-pay | Admitting: Internal Medicine

## 2017-11-21 DIAGNOSIS — E039 Hypothyroidism, unspecified: Secondary | ICD-10-CM

## 2017-12-16 ENCOUNTER — Ambulatory Visit (INDEPENDENT_AMBULATORY_CARE_PROVIDER_SITE_OTHER): Payer: Medicare Other | Admitting: Internal Medicine

## 2017-12-16 ENCOUNTER — Ambulatory Visit (INDEPENDENT_AMBULATORY_CARE_PROVIDER_SITE_OTHER): Payer: Medicare Other

## 2017-12-16 VITALS — BP 124/76 | HR 48 | Temp 97.7°F | Ht 67.5 in | Wt 145.8 lb

## 2017-12-16 DIAGNOSIS — E039 Hypothyroidism, unspecified: Secondary | ICD-10-CM | POA: Diagnosis not present

## 2017-12-16 DIAGNOSIS — Z0000 Encounter for general adult medical examination without abnormal findings: Secondary | ICD-10-CM | POA: Diagnosis not present

## 2017-12-16 DIAGNOSIS — I10 Essential (primary) hypertension: Secondary | ICD-10-CM

## 2017-12-16 LAB — POCT URINALYSIS DIPSTICK
Bilirubin, UA: NEGATIVE
Blood, UA: NEGATIVE
Glucose, UA: NEGATIVE
Ketones, UA: NEGATIVE
Leukocytes, UA: NEGATIVE
Nitrite, UA: NEGATIVE
Protein, UA: NEGATIVE
Spec Grav, UA: 1.02 (ref 1.010–1.025)
Urobilinogen, UA: 2 E.U./dL — AB
pH, UA: 7.5 (ref 5.0–8.0)

## 2017-12-16 MED ORDER — AMLODIPINE BESYLATE 10 MG PO TABS: 10.0000 mg | ORAL_TABLET | Freq: Every morning | ORAL | 2 refills | Status: DC

## 2017-12-16 NOTE — Progress Notes (Addendum)
Subjective:   Shawn Fernandez is a 72 y.o. male who presents for Medicare Annual/Subsequent preventive examination.  Review of Systems:  N/A Cardiac Risk Factors include: advanced age (>30men, >52 women);hypertension     Objective:    Vitals: BP 124/76 (BP Location: Right Arm)   Pulse (!) 48   Temp 97.7 F (36.5 C)   Ht 5' 7.5" (1.715 m)   Wt 145 lb 12.8 oz (66.1 kg)   SpO2 97%   BMI 22.50 kg/m   Body mass index is 22.5 kg/m.  Advanced Directives 12/16/2017 03/21/2016 03/29/2015 03/15/2015 08/08/2013  Does Patient Have a Medical Advance Directive? Yes No No Yes Patient has advance directive, copy not in chart  Type of Advance Directive Living will - - - [place];Living will  Does patient want to make changes to medical advance directive? No - Patient declined - - - No change requested  Copy of [place] in Chart? - - No - copy requested No - copy requested -    Tobacco Social History   Tobacco Use  Smoking Status Former Smoker  . Packs/day: 0.75  . Years: 20.00  . Pack years: 15.00  . Types: Cigarettes  . Last attempt to quit: 07/01/2009  . Years since quitting: 8.4  Smokeless Tobacco Never Used     Counseling given: Not Answered   Clinical Intake:  Pre-visit preparation completed: Yes  Pain : No/denies pain Pain Score: 0-No pain     Nutritional Status: BMI of 19-24  Normal Nutritional Risks: None Diabetes: No  How often do you need to have someone help you when you read instructions, pamphlets, or other written materials from your doctor or pharmacy?: 1 - Never What is the last grade level you completed in school?: 2 years college  Interpreter Needed?: No  Information entered by :: NAllen LPN  Past Medical History:  Diagnosis Date  . Blood clot in abdominal vein    treated at [facility]  . Carotid artery disease ([place])   . Dyslipidemia   . FHx: heart disease   . History of blood transfusion 8-9 yrs ago  . HTN  (hypertension)   . Peripheral vascular disease [facility])    Past Surgical History:  Procedure Laterality Date  . ANKLE SURGERY Right yrs ago  . arm surgery Left yrs ago   2 rods inserted, 1 rod later removed  . COLONOSCOPY WITH PROPOFOL N/A 09/16/2013   Procedure: COLONOSCOPY WITH PROPOFOL;  Surgeon: Beryle Beams, MD;  Location: [facility] ENDOSCOPY;  Service: Endoscopy;  Laterality: N/A;  . LOWER EXTREMITY ANGIOGRAM N/A 07/24/2011   Procedure: LOWER EXTREMITY ANGIOGRAM;  Surgeon: Lorretta Harp, MD;  Location[facility] CATH LAB;  Service: Cardiovascular;  Laterality: N/A;  . stent in abdominal vein  4-5 yrs ago  . US ECHOCARDIOGRAPHY  07-20-2007   EF 55-60%   Family History  Problem Relation Age of Onset  . Heart disease Brother   . Heart disease Mother   . Cancer Father        ? type   Social History   Socioeconomic History  . Marital status: Married    Spouse name: Not on file  . Number of children: Not on file  . Years of education: Not on file  . Highest education level: Not on file  Occupational History  . Occupation: retired  Scientific laboratory technician  . Financial resource strain: Not hard at all  . Food insecurity:    Worry: Never  true    Inability: Never true  . Transportation needs:    Medical: No    Non-medical: No  Tobacco Use  . Smoking status: Former Smoker    Packs/day: 0.75    Years: 20.00    Pack years: 15.00    Types: Cigarettes    Last attempt to quit: 07/01/2009    Years since quitting: 8.4  . Smokeless tobacco: Never Used  Substance and Sexual Activity  . Alcohol use: Yes    Comment: only occ  . Drug use: No  . Sexual activity: Yes  Lifestyle  . Physical activity:    Days per week: 0 days    Minutes per session: 0 min  . Stress: Not at all  Relationships  . Social connections:    Talks on phone: Not on file    Gets together: Not on file    Attends religious service: Not on file    Active member of club or organization: Not on file    Attends meetings of clubs or  organizations: Not on file    Relationship status: Not on file  Other Topics Concern  . Not on file  Social History Narrative  . Not on file    Outpatient Encounter Medications as of 12/16/2017  Medication Sig  . amLODipine (NORVASC) 10 MG tablet Take 1 tablet (10 mg total) by mouth every morning.  Marland Kitchen aspirin EC 81 MG tablet Take 81 mg by mouth daily.  . Cholecalciferol (VITAMIN D3) 1000 units CAPS Take 1 capsule by mouth daily.  . cilostazol (PLETAL) 50 MG tablet Take 50 mg by mouth every morning.  . clopidogrel (PLAVIX) 75 MG tablet Take 75 mg by mouth daily.  Marland Kitchen FeFum-FePoly-FA-B Cmp-C-Biot (INTEGRA PLUS PO) Take 1 capsule by mouth daily.  . iron polysaccharides (NIFEREX) 150 MG capsule Take 1 capsule (150 mg total) by mouth daily.  Marland Kitchen levothyroxine (SYNTHROID, LEVOTHROID) 125 MCG tablet   . losartan (COZAAR) 100 MG tablet Take 100 mg by mouth every morning.   . Multiple Vitamin (MULTIVITAMIN WITH MINERALS) TABS tablet Take 1 tablet by mouth daily.  Marland Kitchen omeprazole (PRILOSEC) 20 MG capsule Take 20 mg by mouth daily.  . simvastatin (ZOCOR) 20 MG tablet Take 1 tablet (20 mg total) by mouth every evening. Please schedule appointment for refills.   No facility-administered encounter medications on file as of 12/16/2017.     Activities of Daily Living In your present state of health, do you have any difficulty performing the following activities: 12/16/2017  Hearing? N  Vision? N  Difficulty concentrating or making decisions? N  Walking or climbing stairs? N  Dressing or bathing? N  Doing errands, shopping? N  Preparing Food and eating ? N  Using the Toilet? N  In the past six months, have you accidently leaked urine? N  Do you have problems with loss of bowel control? N  Managing your Medications? N  Managing your Finances? N  Housekeeping or managing your Housekeeping? N  Some recent data might be hidden    Patient Care Team: [name], MD as PCP - General (Internal  Medicine) Lorretta Harp, MD as Consulting Physician (Cardiology)   Assessment:   This is a routine wellness examination for Shawn Fernandez.  Exercise Activities and Dietary recommendations Current Exercise Habits: The patient does not participate in regular exercise at present, Exercise limited by: None identified  Goals   None     Fall Risk Fall Risk  12/16/2017  Falls in the past  year? No  Risk for fall due to : Medication side effect   Is the patient's home free of loose throw rugs in walkways, pet beds, electrical cords, etc?   yes      Grab bars in the bathroom? yes      Handrails on the stairs?   N/A      Adequate lighting?   yes  Timed Get Up and Go Performed: N/A  Depression Screen PHQ 2/9 Scores 12/16/2017  PHQ - 2 Score 0    Cognitive Function     6CIT Screen 12/16/2017  What Year? 0 points  What month? 0 points  What time? 0 points  Count back from 20 0 points  Months in reverse 0 points  Repeat phrase 0 points  Total Score 0    Immunization History  Administered Date(s) Administered  . Influenza-Unspecified 12/04/2017  . Pneumococcal Conjugate-13 12/04/2017  . Pneumococcal-Unspecified 11/30/2014    Qualifies for Shingles Vaccine?  yes  Screening Tests Health Maintenance  Topic Date Due  . Hepatitis C Screening  1945/12/24  . PNA vac Low Risk Adult (2 of 2 - PPSV23) 12/05/2018  . TETANUS/TDAP  11/17/2022  . COLONOSCOPY  09/17/2023  . INFLUENZA VACCINE  Completed   Cancer Screenings: Lung: Low Dose CT Chest recommended if Age 14-80 years, 30 pack-year currently smoking OR have quit w/in 15years. Patient does qualify. Colorectal: up to date  Additional Screenings:  Hepatitis C Screening:due      Plan:   Patient has had all vaccinations except shingles. States he will be getting some.  I have personally reviewed and noted the following in the patient's chart:   . Medical and social history . Use of alcohol, tobacco or illicit drugs   . Current medications and supplements . Functional ability and status . Nutritional status . Physical activity . Advanced directives . List of other physicians . Hospitalizations, surgeries, and ER visits in previous 12 months . Vitals . Screenings to include cognitive, depression, and falls . Referrals and appointments  In addition, I have reviewed and discussed with patient certain preventive protocols, quality metrics, and best practice recommendations. A written personalized care plan for preventive services as well as general preventive health recommendations were provided to patient.     Kellie Simmering, LPN  [id number]

## 2017-12-16 NOTE — Addendum Note (Signed)
Addended by: Kellie Simmering on: 12/16/2017 04:28 PM   Modules accepted: Orders

## 2017-12-16 NOTE — Patient Instructions (Signed)
Mr. Shawn Fernandez , Thank you for taking time to come for your Medicare Wellness Visit. I appreciate your ongoing commitment to your health goals. Please review the following plan we discussed and let me know if I can assist you in the future.   Screening recommendations/referrals: Colonoscopy: up to date Recommended yearly ophthalmology/optometry visit for glaucoma screening and checkup Recommended yearly dental visit for hygiene and checkup  Vaccinations: Influenza vaccine: up to date Pneumococcal vaccine: up to date Tdap vaccine: up to date Shingles vaccine: will be getting soon  Advanced directives: Please bring a copy of your POA (Power of Attorney) and/or Living Will to your next appointment.    Conditions/risks identified: Patient up to date on all vaccinations except shingles. States will be getting soon.  Next appointment: 12/16/2017 at 3:15p  Preventive Care 72 Years and Older, Male Preventive care refers to lifestyle choices and visits with your health care provider that can promote health and wellness. What does preventive care include?  A yearly physical exam. This is also called an annual well check.  Dental exams once or twice a year.  Routine eye exams. Ask your health care provider how often you should have your eyes checked.  Personal lifestyle choices, including:  Daily care of your teeth and gums.  Regular physical activity.  Eating a healthy diet.  Avoiding tobacco and drug use.  Limiting alcohol use.  Practicing safe sex.  Taking low doses of aspirin every day.  Taking vitamin and mineral supplements as recommended by your health care provider. What happens during an annual well check? The services and screenings done by your health care provider during your annual well check will depend on your age, overall health, lifestyle risk factors, and family history of disease. Counseling  Your health care provider may ask you questions about  your:  Alcohol use.  Tobacco use.  Drug use.  Emotional well-being.  Home and relationship well-being.  Sexual activity.  Eating habits.  History of falls.  Memory and ability to understand (cognition).  Work and work Statistician. Screening  You may have the following tests or measurements:  Height, weight, and BMI.  Blood pressure.  Lipid and cholesterol levels. These may be checked every 5 years, or more frequently if you are over 67 years old.  Skin check.  Lung cancer screening. You may have this screening every year starting at age 62 if you have a 30-pack-year history of smoking and currently smoke or have quit within the past 15 years.  Fecal occult blood test (FOBT) of the stool. You may have this test every year starting at age 36.  Flexible sigmoidoscopy or colonoscopy. You may have a sigmoidoscopy every 5 years or a colonoscopy every 10 years starting at age 47.  Prostate cancer screening. Recommendations will vary depending on your family history and other risks.  Hepatitis C blood test.  Hepatitis B blood test.  Sexually transmitted disease (STD) testing.  Diabetes screening. This is done by checking your blood sugar (glucose) after you have not eaten for a while (fasting). You may have this done every 1-3 years.  Abdominal aortic aneurysm (AAA) screening. You may need this if you are a current or former smoker.  Osteoporosis. You may be screened starting at age 79 if you are at high risk. Talk with your health care provider about your test results, treatment options, and if necessary, the need for more tests. Vaccines  Your health care provider may recommend certain vaccines, such as:  Influenza  vaccine. This is recommended every year.  Tetanus, diphtheria, and acellular pertussis (Tdap, Td) vaccine. You may need a Td booster every 10 years.  Zoster vaccine. You may need this after age 25.  Pneumococcal 13-valent conjugate (PCV13) vaccine.  One dose is recommended after age 76.  Pneumococcal polysaccharide (PPSV23) vaccine. One dose is recommended after age 43. Talk to your health care provider about which screenings and vaccines you need and how often you need them. This information is not intended to replace advice given to you by your health care provider. Make sure you discuss any questions you have with your health care provider. Document Released: 03/16/2015 Document Revised: 11/07/2015 Document Reviewed: 12/19/2014 Elsevier Interactive Patient Education  2017 [place] Prevention in the Home Falls can cause injuries. They can happen to people of all ages. There are many things you can do to make your home safe and to help prevent falls. What can I do on the outside of my home?  Regularly fix the edges of walkways and driveways and fix any cracks.  Remove anything that might make you trip as you walk through a door, such as a raised step or threshold.  Trim any bushes or trees on the path to your home.  Use bright outdoor lighting.  Clear any walking paths of anything that might make someone trip, such as rocks or tools.  Regularly check to see if handrails are loose or broken. Make sure that both sides of any steps have handrails.  Any raised decks and porches should have guardrails on the edges.  Have any leaves, snow, or ice cleared regularly.  Use sand or salt on walking paths during winter.  Clean up any spills in your garage right away. This includes oil or grease spills. What can I do in the bathroom?  Use night lights.  Install grab bars by the toilet and in the tub and shower. Do not use towel bars as grab bars.  Use non-skid mats or decals in the tub or shower.  If you need to sit down in the shower, use a plastic, non-slip stool.  Keep the floor dry. Clean up any water that spills on the floor as soon as it happens.  Remove soap buildup in the tub or shower regularly.  Attach bath  mats securely with double-sided non-slip rug tape.  Do not have throw rugs and other things on the floor that can make you trip. What can I do in the bedroom?  Use night lights.  Make sure that you have a light by your bed that is easy to reach.  Do not use any sheets or blankets that are too big for your bed. They should not hang down onto the floor.  Have a firm chair that has side arms. You can use this for support while you get dressed.  Do not have throw rugs and other things on the floor that can make you trip. What can I do in the kitchen?  Clean up any spills right away.  Avoid walking on wet floors.  Keep items that you use a lot in easy-to-reach places.  If you need to reach something above you, use a strong step stool that has a grab bar.  Keep electrical cords out of the way.  Do not use floor polish or wax that makes floors slippery. If you must use wax, use non-skid floor wax.  Do not have throw rugs and other things on the floor that can  make you trip. What can I do with my stairs?  Do not leave any items on the stairs.  Make sure that there are handrails on both sides of the stairs and use them. Fix handrails that are broken or loose. Make sure that handrails are as long as the stairways.  Check any carpeting to make sure that it is firmly attached to the stairs. Fix any carpet that is loose or worn.  Avoid having throw rugs at the top or bottom of the stairs. If you do have throw rugs, attach them to the floor with carpet tape.  Make sure that you have a light switch at the top of the stairs and the bottom of the stairs. If you do not have them, ask someone to add them for you. What else can I do to help prevent falls?  Wear shoes that:  Do not have high heels.  Have rubber bottoms.  Are comfortable and fit you well.  Are closed at the toe. Do not wear sandals.  If you use a stepladder:  Make sure that it is fully opened. Do not climb a closed  stepladder.  Make sure that both sides of the stepladder are locked into place.  Ask someone to hold it for you, if possible.  Clearly mark and make sure that you can see:  Any grab bars or handrails.  First and last steps.  Where the edge of each step is.  Use tools that help you move around (mobility aids) if they are needed. These include:  Canes.  Walkers.  Scooters.  Crutches.  Turn on the lights when you go into a dark area. Replace any light bulbs as soon as they burn out.  Set up your furniture so you have a clear path. Avoid moving your furniture around.  If any of your floors are uneven, fix them.  If there are any pets around you, be aware of where they are.  Review your medicines with your doctor. Some medicines can make you feel dizzy. This can increase your chance of falling. Ask your doctor what other things that you can do to help prevent falls. This information is not intended to replace advice given to you by your health care provider. Make sure you discuss any questions you have with your health care provider. Document Released: 12/14/2008 Document Revised: 07/26/2015 Document Reviewed: 03/24/2014 Elsevier Interactive Patient Education  2017 [employer].

## 2017-12-16 NOTE — Patient Instructions (Signed)

## 2018-01-02 IMAGING — CR DG KNEE COMPLETE 4+V*L*
4 series · 4 of 4 positions shown · non-contrast
Comparison: None.

CLINICAL DATA: Chronic bilateral knee pain, left greater than right

EXAM:
LEFT KNEE - COMPLETE 4+ VIEW

[w knee ap left]
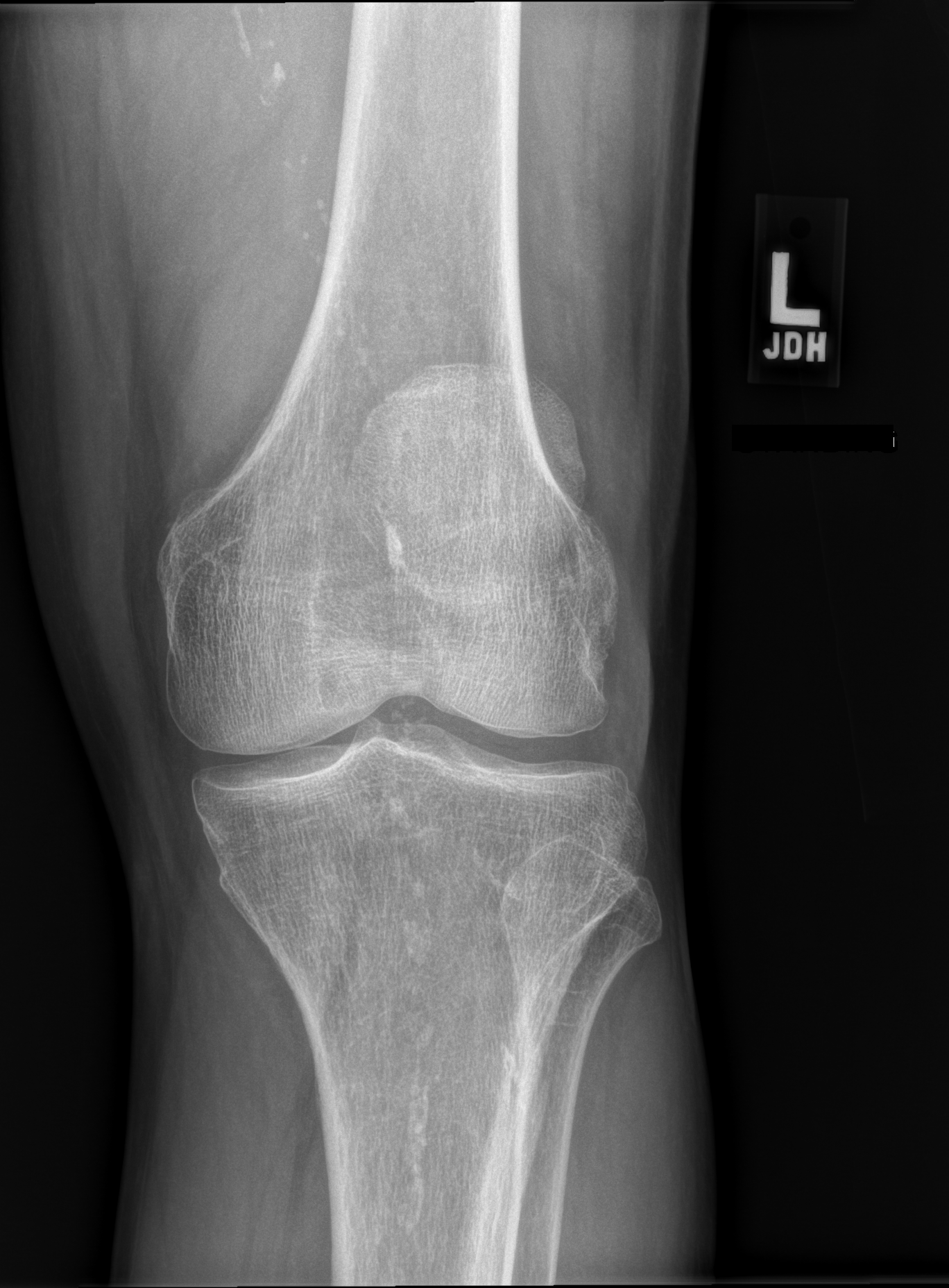

[w knee lat left]
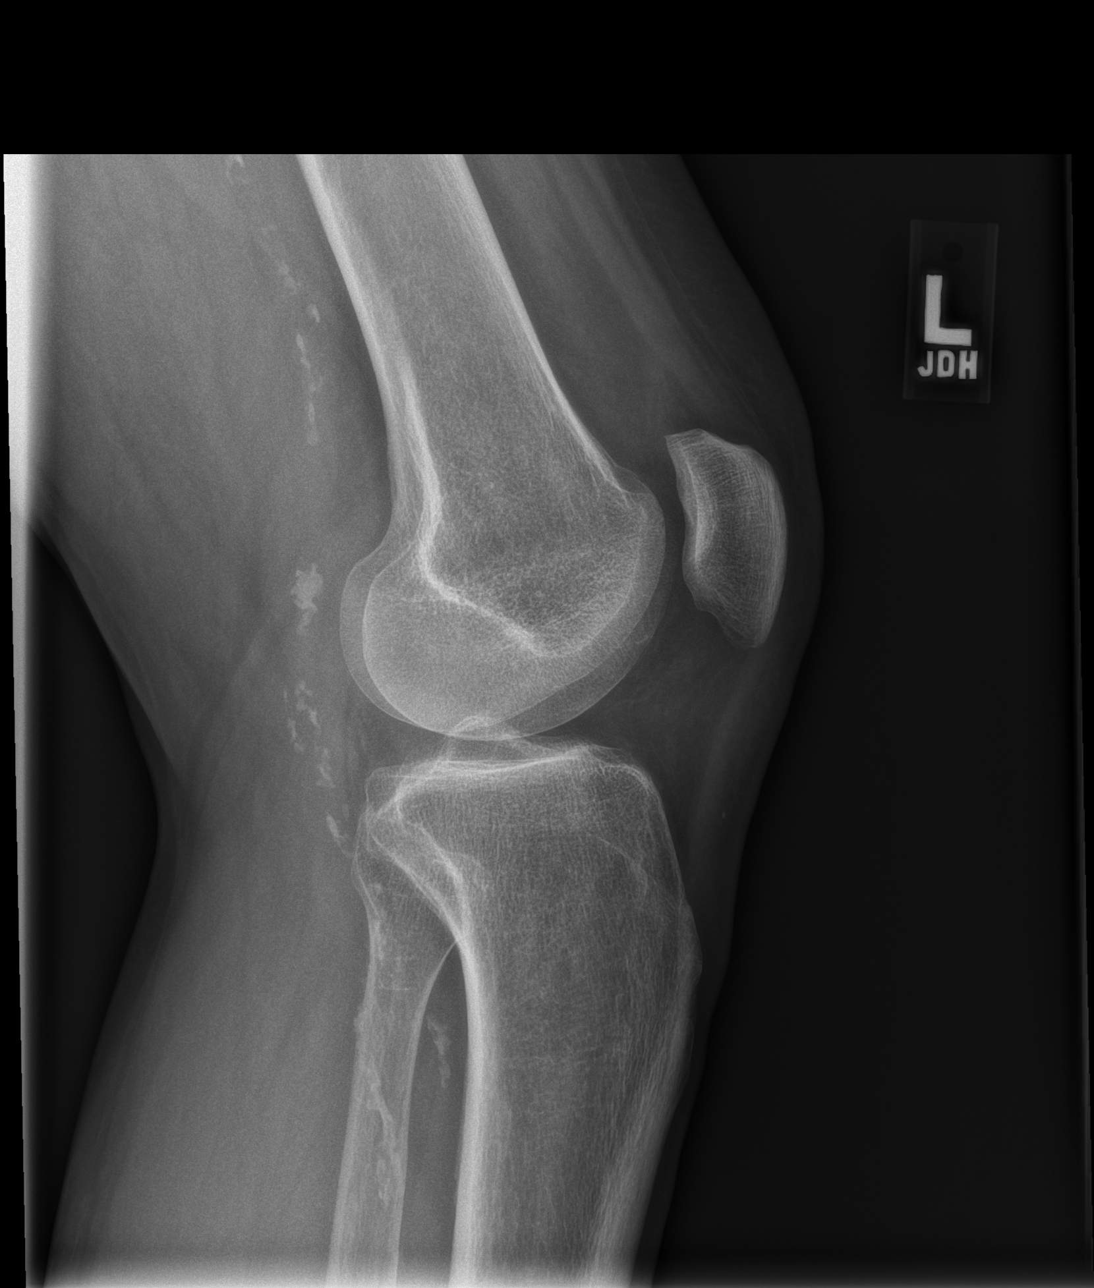

[x knee sunrise left]
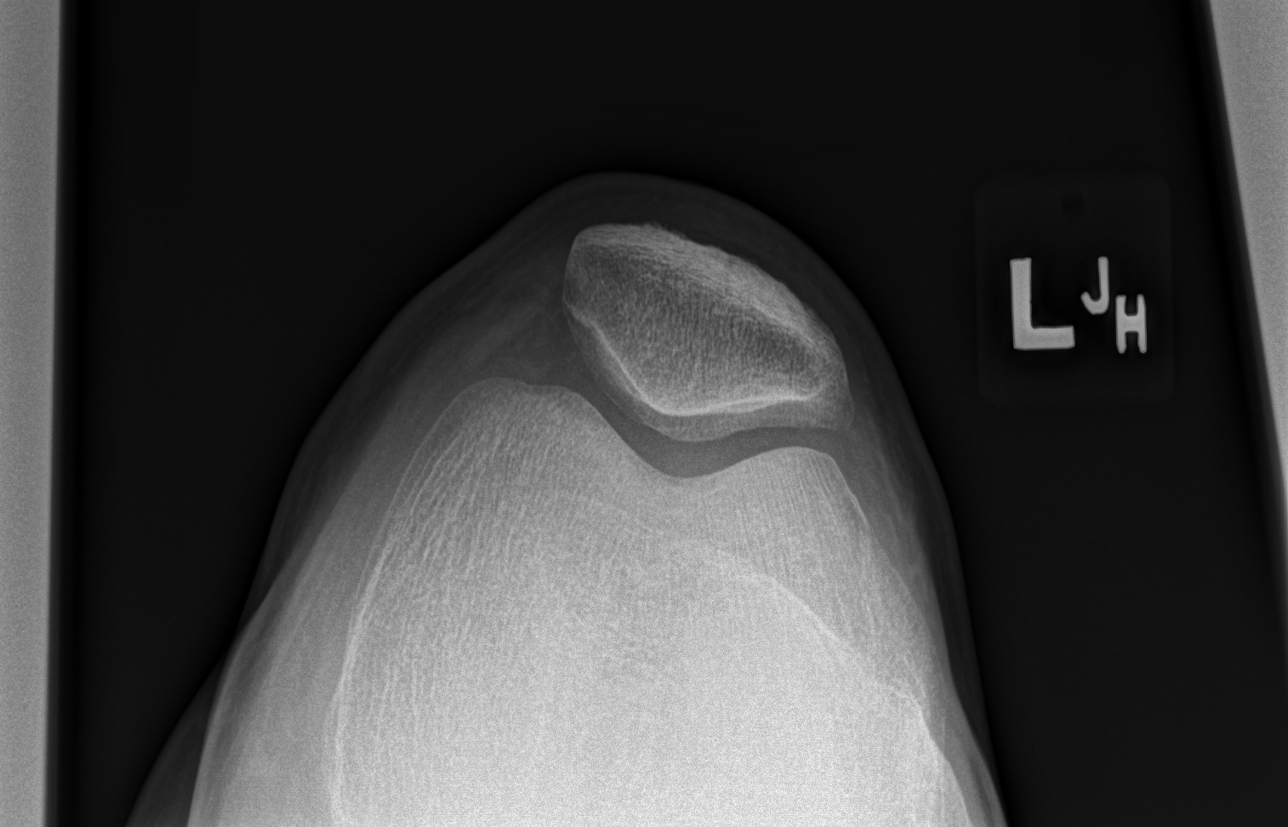

[x knee tunnel left]
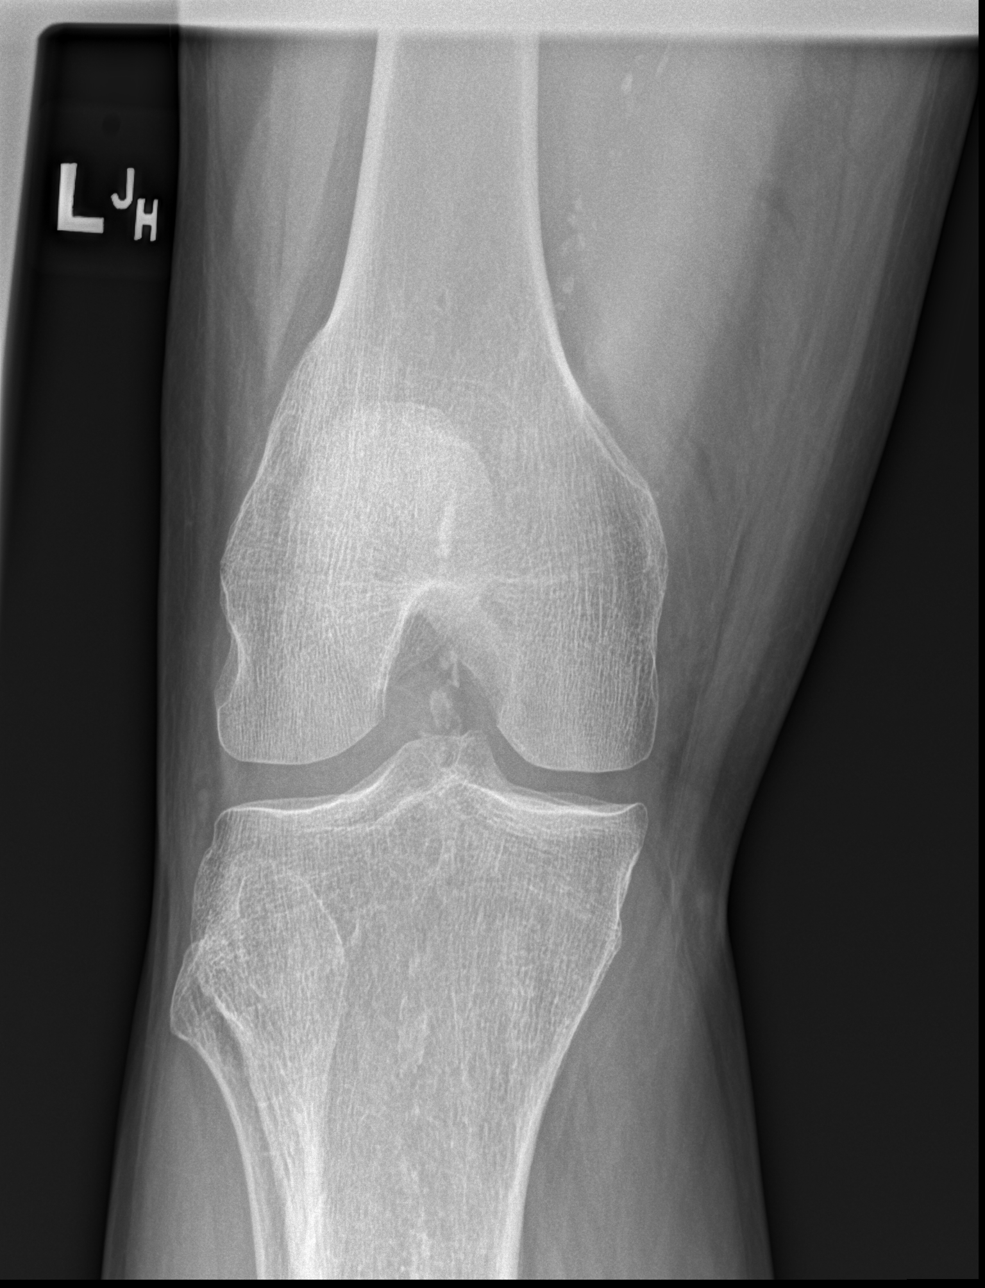

[4 of 4 positions shown; findings below may reference images not displayed]

FINDINGS: No fracture or dislocation is seen.

Joint spaces are essentially preserved. Tiny patellofemoral
osteophytes.

No definite suprapatellar knee joint effusion.

Vascular calcifications.
IMPRESSION: No acute osseus abnormality is seen.

## 2018-01-02 IMAGING — CR DG KNEE COMPLETE 4+V*R*
4 series · 4 of 4 positions shown · non-contrast
Comparison: None.

CLINICAL DATA: Arthralgia of both knees.

EXAM:
RIGHT KNEE - COMPLETE 4+ VIEW

[w knee ap right]
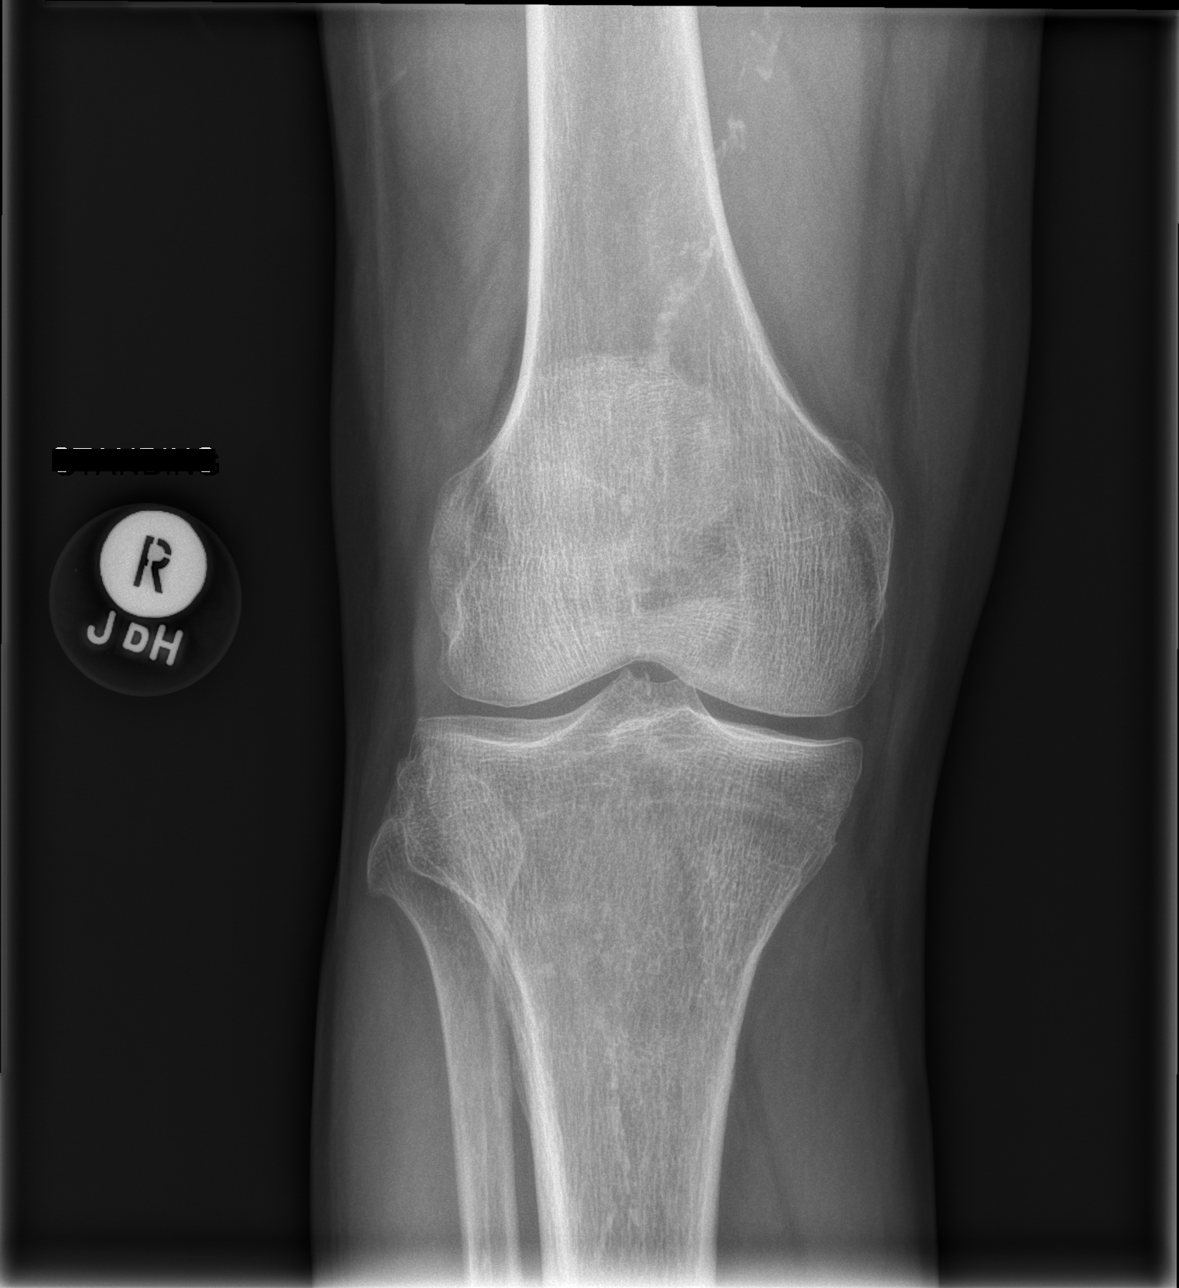

[w knee lat right]
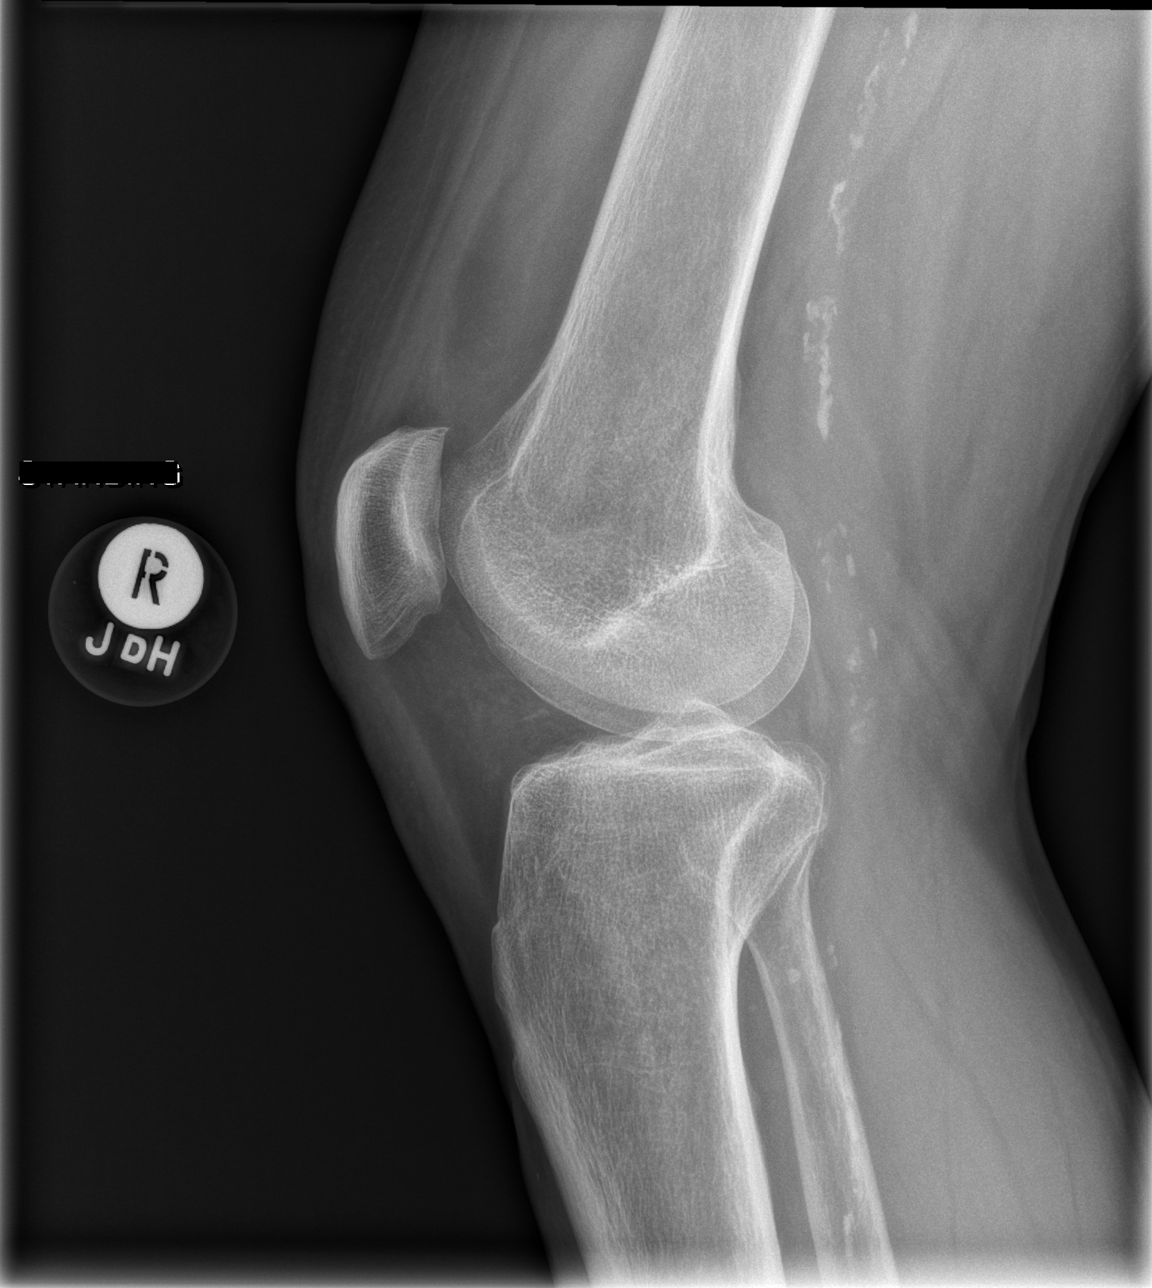

[x knee tunnel right]
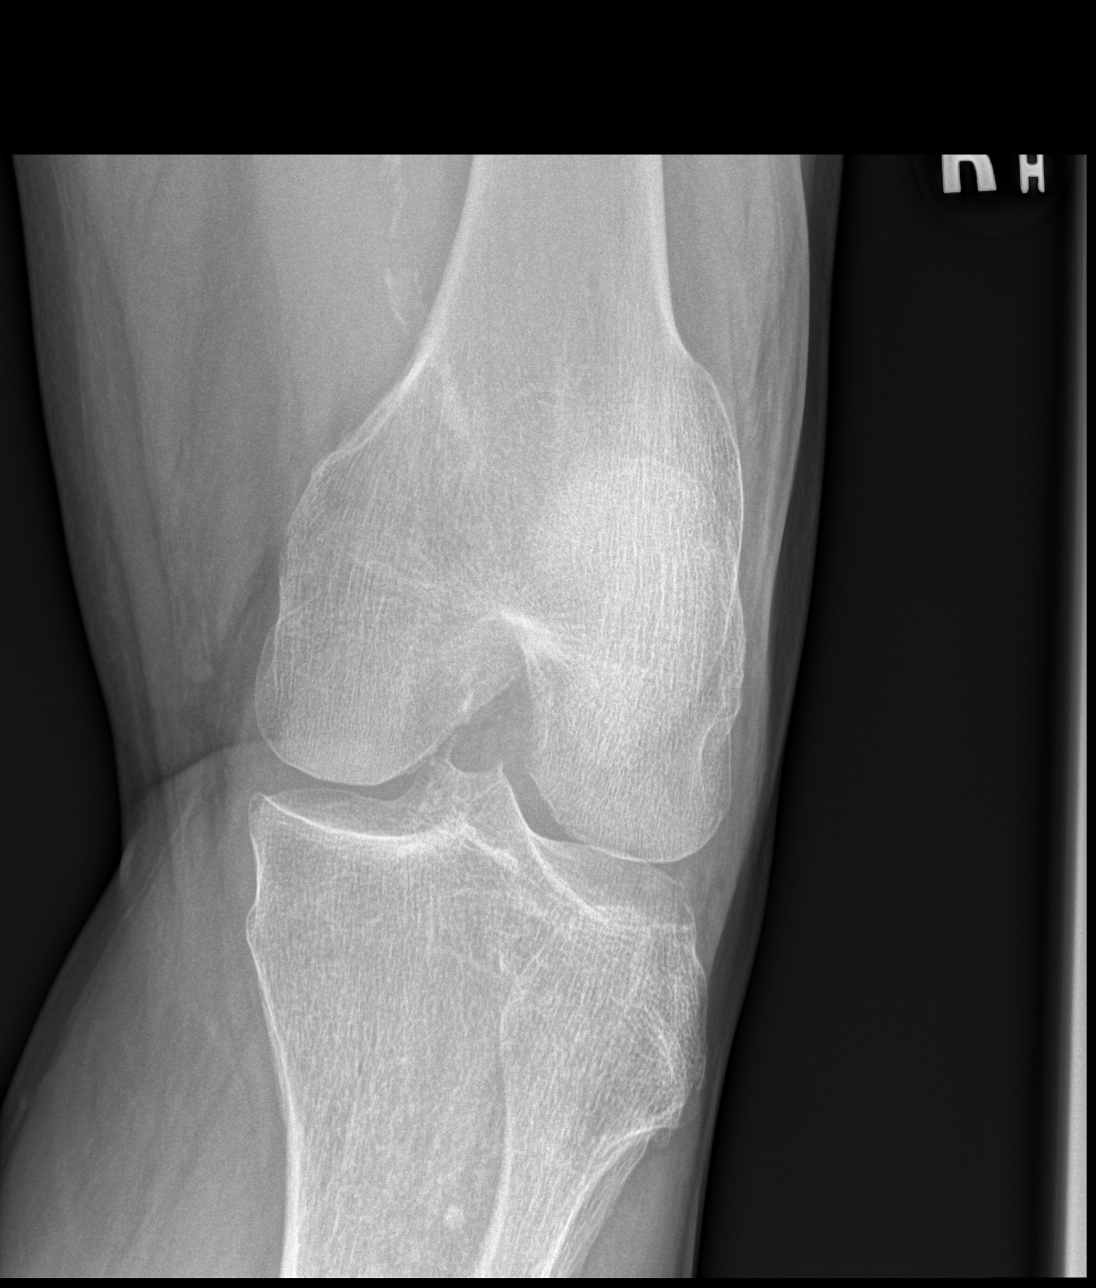

[x knee sunrise right]
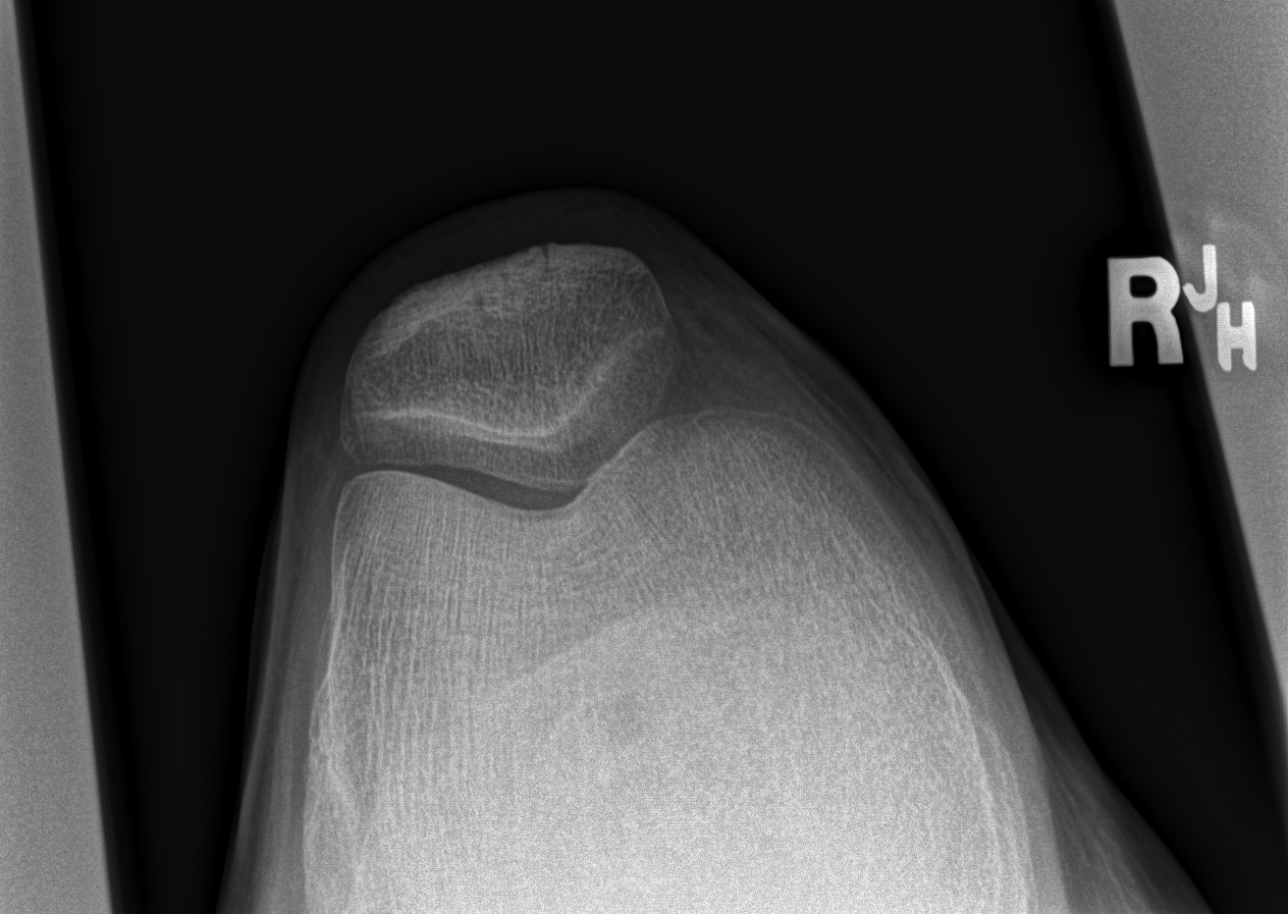

[4 of 4 positions shown; findings below may reference images not displayed]

FINDINGS: No evidence of fracture, dislocation, or joint effusion.

No notable spurring or degenerative joint narrowing. No erosive
changes.

Atherosclerotic calcification.
IMPRESSION: 1. No focal finding or degenerative joint narrowing.
2. Atherosclerosis.

## 2018-01-11 ENCOUNTER — Encounter: Payer: Self-pay | Admitting: Internal Medicine

## 2018-01-11 NOTE — Progress Notes (Signed)
Subjective:     Patient ID: Shawn Fernandez , male    DOB: 03-Jan-1946 , 72 y.o.   MRN: [id number]   Chief Complaint  Patient presents with  . Hypertension    HPI  Hypertension  This is a chronic problem. The current episode started more than 1 year ago. The problem has been gradually improving since onset. The problem is controlled. Pertinent negatives include no blurred vision, chest pain, headaches, palpitations or shortness of breath. Risk factors for coronary artery disease include male gender and dyslipidemia.   He reports compliance with meds.   Past Medical History:  Diagnosis Date  . Blood clot in abdominal vein    treated at [facility]  . Carotid artery disease ([place])   . Dyslipidemia   . FHx: heart disease   . History of blood transfusion 8-9 yrs ago  . HTN (hypertension)   . Peripheral vascular disease ([place])      Family History  Problem Relation Age of Onset  . Heart disease Brother   . Heart disease Mother   . Cancer Father        ? type     Current Outpatient Medications:  .  amLODipine (NORVASC) 10 MG tablet, Take 1 tablet (10 mg total) by mouth every morning., Disp: 90 tablet, Rfl: 2 .  aspirin EC 81 MG tablet, Take 81 mg by mouth daily., Disp: , Rfl:  .  Cholecalciferol (VITAMIN D3) 1000 units CAPS, Take 1 capsule by mouth daily., Disp: , Rfl:  .  cilostazol (PLETAL) 50 MG tablet, Take 50 mg by mouth every morning., Disp: , Rfl:  .  clopidogrel (PLAVIX) 75 MG tablet, Take 75 mg by mouth daily., Disp: , Rfl: 5 .  FeFum-FePoly-FA-B Cmp-C-Biot (INTEGRA PLUS PO), Take 1 capsule by mouth daily., Disp: , Rfl:  .  iron polysaccharides (NIFEREX) 150 MG capsule, Take 1 capsule (150 mg total) by mouth daily., Disp: 30 capsule, Rfl: 5 .  levothyroxine (SYNTHROID, LEVOTHROID) 125 MCG tablet, , Disp: , Rfl:  .  losartan (COZAAR) 100 MG tablet, Take 100 mg by mouth every morning. , Disp: , Rfl:  .  Multiple Vitamin (MULTIVITAMIN WITH MINERALS) TABS tablet, Take 1 tablet  by mouth daily., Disp: , Rfl:  .  omeprazole (PRILOSEC) 20 MG capsule, Take 20 mg by mouth daily., Disp: , Rfl:  .  simvastatin (ZOCOR) 20 MG tablet, Take 1 tablet (20 mg total) by mouth every evening. Please schedule appointment for refills., Disp: 30 tablet, Rfl: 0   Allergies  Allergen Reactions  . Lisinopril Cough     Review of Systems  Constitutional: Negative.   Eyes: Negative for blurred vision.  Respiratory: Negative.  Negative for shortness of breath.   Cardiovascular: Negative.  Negative for chest pain and palpitations.  Gastrointestinal: Negative.   Genitourinary: Negative.   Skin: Negative.   Neurological: Negative.  Negative for headaches.  Psychiatric/Behavioral: Negative.      Today's Vitals   12/16/17 1459  BP: 124/76  Pulse: (!) 48  Temp: 97.7 F (36.5 C)  TempSrc: Oral  Weight: 145 lb 12.8 oz (66.1 kg)  Height: 5' 7.5" (1.715 m)  PainSc: 0-No pain   Body mass index is 22.5 kg/m.   Objective:  Physical Exam  Constitutional: He is oriented to person, place, and time. He appears well-developed and well-nourished.  HENT:  Head: Normocephalic and atraumatic.  Eyes: EOM are normal.  Cardiovascular: Normal rate, regular rhythm and normal heart sounds.  Pulmonary/Chest: Effort normal  and breath sounds normal.  Neurological: He is alert and oriented to person, place, and time.  Psychiatric: He has a normal mood and affect.  Nursing note and vitals reviewed.       Assessment And Plan:     1. Essential hypertension, benign  Well controlled. He will continue with current meds. He is encouraged to avoid adding salt to his foods. He was also given refill of amlodipine.  2. Primary hypothyroidism  Dose of Synthroid was reviewed. He will rto in 3 months. I will check labs at that time.  [name], MD

## 2018-01-27 ENCOUNTER — Other Ambulatory Visit: Payer: Medicare Other

## 2018-01-27 DIAGNOSIS — E039 Hypothyroidism, unspecified: Secondary | ICD-10-CM

## 2018-01-28 LAB — TSH: TSH: 2.28 u[IU]/mL (ref 0.450–4.500)

## 2018-01-30 NOTE — Progress Notes (Signed)
Thyroid fxn is perfect! How does he feel? pls confirm his current dosing of thyroid meds.

## 2018-04-16 ENCOUNTER — Other Ambulatory Visit: Payer: Self-pay | Admitting: Nurse Practitioner

## 2018-06-13 ENCOUNTER — Other Ambulatory Visit: Payer: Self-pay | Admitting: Internal Medicine

## 2018-06-17 ENCOUNTER — Encounter: Payer: Self-pay | Admitting: Internal Medicine

## 2018-06-17 ENCOUNTER — Other Ambulatory Visit: Payer: Self-pay

## 2018-06-17 ENCOUNTER — Ambulatory Visit (INDEPENDENT_AMBULATORY_CARE_PROVIDER_SITE_OTHER): Payer: Medicare Other | Admitting: Internal Medicine

## 2018-06-17 VITALS — BP 148/76 | HR 62 | Temp 97.7°F | Ht 67.5 in | Wt 145.4 lb

## 2018-06-17 DIAGNOSIS — E7800 Pure hypercholesterolemia, unspecified: Secondary | ICD-10-CM

## 2018-06-17 DIAGNOSIS — Z6822 Body mass index (BMI) 22.0-22.9, adult: Secondary | ICD-10-CM

## 2018-06-17 DIAGNOSIS — I10 Essential (primary) hypertension: Secondary | ICD-10-CM | POA: Diagnosis not present

## 2018-06-17 DIAGNOSIS — E039 Hypothyroidism, unspecified: Secondary | ICD-10-CM

## 2018-06-17 NOTE — Patient Instructions (Signed)
Hypothyroidism  Hypothyroidism is when the thyroid gland does not make enough of certain hormones (it is underactive). The thyroid gland is a small gland located in the lower front part of the neck, just in front of the windpipe (trachea). This gland makes hormones that help control how the body uses food for energy (metabolism) as well as how the heart and brain function. These hormones also play a role in keeping your bones strong. When the thyroid is underactive, it produces too little of the hormones thyroxine (T4) and triiodothyronine (T3). What are the causes? This condition may be caused by:  Hashimoto's disease. This is a disease in which the body's disease-fighting system (immune system) attacks the thyroid gland. This is the most common cause.  Viral infections.  Pregnancy.  Certain medicines.  Birth defects.  Past radiation treatments to the head or neck for cancer.  Past treatment with radioactive iodine.  Past exposure to radiation in the environment.  Past surgical removal of part or all of the thyroid.  Problems with a gland in the center of the brain (pituitary gland).  Lack of enough iodine in the diet. What increases the risk? You are more likely to develop this condition if:  You are male.  You have a family history of thyroid conditions.  You use a medicine called lithium.  You take medicines that affect the immune system (immunosuppressants). What are the signs or symptoms? Symptoms of this condition include:  Feeling as though you have no energy (lethargy).  Not being able to tolerate cold.  Weight gain that is not explained by a change in diet or exercise habits.  Lack of appetite.  Dry skin.  Coarse hair.  Menstrual irregularity.  Slowing of thought processes.  Constipation.  Sadness or depression. How is this diagnosed? This condition may be diagnosed based on:  Your symptoms, your medical history, and a physical exam.  Blood  tests. You may also have imaging tests, such as an ultrasound or MRI. How is this treated? This condition is treated with medicine that replaces the thyroid hormones that your body does not make. After you begin treatment, it may take several weeks for symptoms to go away. Follow these instructions at home:  Take over-the-counter and prescription medicines only as told by your health care provider.  If you start taking any new medicines, tell your health care provider.  Keep all follow-up visits as told by your health care provider. This is important. ? As your condition improves, your dosage of thyroid hormone medicine may change. ? You will need to have blood tests regularly so that your health care provider can monitor your condition. Contact a health care provider if:  Your symptoms do not get better with treatment.  You are taking thyroid replacement medicine and you: ? Sweat a lot. ? Have tremors. ? Feel anxious. ? Lose weight rapidly. ? Cannot tolerate heat. ? Have emotional swings. ? Have diarrhea. ? Feel weak. Get help right away if you have:  Chest pain.  An irregular heartbeat.  A rapid heartbeat.  Difficulty breathing. Summary  Hypothyroidism is when the thyroid gland does not make enough of certain hormones (it is underactive).  When the thyroid is underactive, it produces too little of the hormones thyroxine (T4) and triiodothyronine (T3).  The most common cause is Hashimoto's disease, a disease in which the body's disease-fighting system (immune system) attacks the thyroid gland. The condition can also be caused by viral infections, medicine, pregnancy, or past   radiation treatment to the head or neck.  Symptoms may include weight gain, dry skin, constipation, feeling as though you do not have energy, and not being able to tolerate cold.  This condition is treated with medicine to replace the thyroid hormones that your body does not make. This information  is not intended to replace advice given to you by your health care provider. Make sure you discuss any questions you have with your health care provider. Document Released: 02/17/2005 Document Revised: 01/28/2017 Document Reviewed: 01/28/2017 Elsevier Interactive Patient Education  2019 Else[employer] Inc[employer]  

## 2018-06-18 ENCOUNTER — Telehealth: Payer: Self-pay

## 2018-06-18 ENCOUNTER — Other Ambulatory Visit: Payer: Self-pay

## 2018-06-18 LAB — CMP14+EGFR
ALT: 6 IU/L (ref 0–44)
AST: 22 IU/L (ref 0–40)
Albumin/Globulin Ratio: 1.6 (ref 1.2–2.2)
Albumin: 4.6 g/dL (ref 3.7–4.7)
Alkaline Phosphatase: 109 IU/L (ref 39–117)
BUN/Creatinine Ratio: 10 (ref 10–24)
BUN: 9 mg/dL (ref 8–27)
Bilirubin Total: 0.7 mg/dL (ref 0.0–1.2)
CO2: 20 mmol/L (ref 20–29)
Calcium: 9.4 mg/dL (ref 8.6–10.2)
Chloride: 107 mmol/L — ABNORMAL HIGH (ref 96–106)
Creatinine, Ser: 0.94 mg/dL (ref 0.76–1.27)
GFR calc Af Amer: 93 mL/min/{1.73_m2} (ref >59)
GFR calc non Af Amer: 81 mL/min/{1.73_m2} (ref >59)
Globulin, Total: 2.8 g/dL (ref 1.5–4.5)
Glucose: 88 mg/dL (ref 65–99)
Potassium: 4 mmol/L (ref 3.5–5.2)
Sodium: 147 mmol/L — ABNORMAL HIGH (ref 134–144)
Total Protein: 7.4 g/dL (ref 6.0–8.5)

## 2018-06-18 LAB — LIPID PANEL
Chol/HDL Ratio: 2.5 ratio (ref 0.0–5.0)
Cholesterol, Total: 169 mg/dL (ref 100–199)
HDL: 68 mg/dL (ref >39)
LDL Calculated: 88 mg/dL (ref 0–99)
Triglycerides: 66 mg/dL (ref 0–149)
VLDL Cholesterol Cal: 13 mg/dL (ref 5–40)

## 2018-06-18 LAB — TSH: TSH: <0.006 u[IU]/mL — ABNORMAL LOW (ref 0.450–4.500)

## 2018-06-18 LAB — T4, FREE: Free T4: 2.15 ng/dL — ABNORMAL HIGH (ref 0.82–1.77)

## 2018-06-18 NOTE — Telephone Encounter (Signed)
I tried to call the pt back with an update on medication changes and to see what time he takes his synthroid.  Voicemail is full.

## 2018-06-18 NOTE — Progress Notes (Signed)
Subjective:     Patient ID: Shawn Fernandez , male    DOB: Jun 04, 1945 , 73 y.o.   MRN: [id number]   Chief Complaint  Patient presents with  . Hypothyroidism  . Hypertension    HPI  He is here today for f/u hypothyroidism. He reports compliance with meds. He was seen by his endocrinologist, Dr. Chalmers Fernandez in December 2019. He is currently taking levothyroxine 172mg daily. He denies having any issues with the medication.   Hypertension  This is a chronic problem. The current episode started more than 1 year ago. The problem is unchanged. The problem is controlled. Pertinent negatives include no blurred vision, chest pain, palpitations or shortness of breath. Risk factors for coronary artery disease include dyslipidemia, male gender and sedentary lifestyle. Identifiable causes of hypertension include a thyroid problem.     Past Medical History:  Diagnosis Date  . Blood clot in abdominal vein    [facility] . [place]   . Dyslipidemia   . FHx: heart disease   . History of blood transfusion 8-9 yrs ago  . HTN (hypertension)   . [place]      Family History  Problem Relation Age of Onset  . Heart disease Brother   . Heart disease Mother   . Cancer Father        ? type     Current Outpatient Medications:  .  amLODipine (NORVASC) 10 MG tablet, Take 1 tablet (10 mg total) by mouth every morning., Disp: 90 tablet, Rfl: 2 .  aspirin EC 81 MG tablet, Take 81 mg by mouth daily., Disp: , Rfl:  .  Cholecalciferol (VITAMIN D3) 1000 units CAPS, Take 1 capsule by mouth daily., Disp: , Rfl:  .  cilostazol (PLETAL) 50 MG tablet, Take 50 mg by mouth every morning., Disp: , Rfl:  .  clopidogrel (PLAVIX) 75 MG tablet, TAKE 1 TABLET BY MOUTH EVERY DAY, Disp: 90 tablet, Rfl: 1 .  iron polysaccharides (NIFEREX) 150 MG capsule, Take 1 capsule (150 mg total) by mouth daily., Disp: 30 capsule, Rfl: 5 .  levothyroxine (SYNTHROID, LEVOTHROID) 137 MCG  tablet, 137 mcg. Take 1 tablet by mouth Monday - Friday, Disp: , Rfl:  .  losartan (COZAAR) 100 MG tablet, TAKE 1 TABLET BY MOUTH EVERY DAY, Disp: 90 tablet, Rfl: 0 .  Multiple Vitamin (MULTIVITAMIN WITH MINERALS) TABS tablet, Take 1 tablet by mouth daily., Disp: , Rfl:  .  omeprazole (PRILOSEC) 20 MG capsule, Take 20 mg by mouth daily., Disp: , Rfl:  .  simvastatin (ZOCOR) 20 MG tablet, Take 1 tablet (20 mg total) by mouth every evening. Please schedule appointment for refills., Disp: 30 tablet, Rfl: 0   Allergies  Allergen Reactions  . Lisinopril Cough     Review of Systems  Constitutional: Negative.   Eyes: Negative for blurred vision.  Respiratory: Negative.  Negative for shortness of breath.   Cardiovascular: Negative.  Negative for chest pain and palpitations.  Gastrointestinal: Negative.   Neurological: Negative.   Psychiatric/Behavioral: Negative.      Today's Vitals   06/17/18 1010  BP: (!) 148/76  Pulse: 62  Temp: 97.7 F (36.5 C)  TempSrc: Oral  Weight: 145 lb 6.4 oz (66 kg)  Height: 5' 7.5" (1.715 m)  PainSc: 0-No pain   Body mass index is 22.44 kg/m.   Objective:  Physical Exam Vitals signs and nursing note reviewed.  Constitutional:      Appearance: Normal  appearance.  Cardiovascular:     Rate and Rhythm: Normal rate and regular rhythm.     Heart sounds: Normal heart sounds.  Pulmonary:     Effort: Pulmonary effort is normal.     Breath sounds: Normal breath sounds.  Skin:    General: Skin is warm.  Neurological:     General: No focal deficit present.     Mental Status: He is alert.  Psychiatric:        Mood and Affect: Mood normal.         Assessment And Plan:     1. Primary hypothyroidism  I will check thyroid panel and adjust meds as needed.  - TSH - T4, Free  2. Essential hypertension, benign  Fair control. He will continue with current meds for now. He is encouraged to avoid adding salt to his foods. Importance of dietary and  medication compliance was discussed with the patient.   - CMP14+EGFR - Lipid panel  3. Pure hypercholesterolemia  I will check fasting lipid panel and LFTs today. He is encouraged to avoid fried foods, exercise regularly and increase his fish intake.   4. Body mass index (BMI) 22.0-22.9, adult  His weight is stable. He reports his wife is concerned about his weight. Pt advised his weight loss may be related to his thyroid. I will make further recommendations once his labs are available for review.   Shawn Greenland, MD    THE PATIENT IS ENCOURAGED TO PRACTICE SOCIAL DISTANCING DUE TO THE COVID-19 PANDEMIC.

## 2018-06-23 ENCOUNTER — Other Ambulatory Visit: Payer: Self-pay

## 2018-06-23 MED ORDER — LEVOTHYROXINE SODIUM 137 MCG PO TABS: ORAL_TABLET | ORAL | Status: DC

## 2018-07-01 ENCOUNTER — Other Ambulatory Visit: Payer: Self-pay | Admitting: Internal Medicine

## 2018-07-19 ENCOUNTER — Other Ambulatory Visit: Payer: Self-pay

## 2018-08-02 ENCOUNTER — Other Ambulatory Visit: Payer: Self-pay | Admitting: Internal Medicine

## 2018-09-09 ENCOUNTER — Other Ambulatory Visit: Payer: Self-pay | Admitting: Cardiovascular Disease

## 2018-09-09 ENCOUNTER — Other Ambulatory Visit: Payer: Self-pay

## 2018-09-09 ENCOUNTER — Ambulatory Visit (HOSPITAL_COMMUNITY)
Admission: RE | Admit: 2018-09-09 | Discharge: 2018-09-09 | Disposition: A | Discharge status: Home / Self Care | Payer: Medicare Other | Source: Ambulatory Visit | Attending: Cardiology | Admitting: Cardiology

## 2018-09-09 DIAGNOSIS — I6523 Occlusion and stenosis of bilateral carotid arteries: Secondary | ICD-10-CM | POA: Insufficient documentation

## 2018-09-15 ENCOUNTER — Ambulatory Visit (INDEPENDENT_AMBULATORY_CARE_PROVIDER_SITE_OTHER): Payer: Medicare Other

## 2018-09-15 ENCOUNTER — Other Ambulatory Visit: Payer: Self-pay

## 2018-09-15 VITALS — Ht 69.0 in | Wt 159.0 lb

## 2018-09-15 DIAGNOSIS — Z0000 Encounter for general adult medical examination without abnormal findings: Secondary | ICD-10-CM

## 2018-09-15 NOTE — Patient Instructions (Signed)
Shawn Fernandez , Thank you for taking time to come for your Medicare Wellness Visit. I appreciate your ongoing commitment to your health goals. Please review the following plan we discussed and let me know if I can assist you in the future.   Screening recommendations/referrals: Colonoscopy: 08/2013 Recommended yearly ophthalmology/optometry visit for glaucoma screening and checkup Recommended yearly dental visit for hygiene and checkup  Vaccinations: Influenza vaccine: 12/2017 Pneumococcal vaccine: 12/2017 Tdap vaccine: 11/2012 Shingles vaccine: discussed    Advanced directives: Please bring a copy of your POA (Power of [place]) and/or Living Will to your next appointment.    Conditions/risks identified: none  Next appointment: 01/04/2019 at 2:30  Preventive Care 65 Years and Older, Male Preventive care refers to lifestyle choices and visits with your health care provider that can promote health and wellness. What does preventive care include?  A yearly physical exam. This is also called an annual well check.  Dental exams once or twice a year.  Routine eye exams. Ask your health care provider how often you should have your eyes checked.  Personal lifestyle choices, including:  Daily care of your teeth and gums.  Regular physical activity.  Eating a healthy diet.  Avoiding tobacco and drug use.  Limiting alcohol use.  Practicing safe sex.  Taking low doses of aspirin every day.  Taking vitamin and mineral supplements as recommended by your health care provider. What happens during an annual well check? The services and screenings done by your health care provider during your annual well check will depend on your age, overall health, lifestyle risk factors, and family history of disease. Counseling  Your health care provider may ask you questions about your:  Alcohol use.  Tobacco use.  Drug use.  Emotional well-being.  Home and relationship well-being.   Sexual activity.  Eating habits.  History of falls.  Memory and ability to understand (cognition).  Work and work Statistician. Screening  You may have the following tests or measurements:  Height, weight, and BMI.  Blood pressure.  Lipid and cholesterol levels. These may be checked every 5 years, or more frequently if you are over 21 years old.  Skin check.  Lung cancer screening. You may have this screening every year starting at age 59 if you have a 30-pack-year history of smoking and currently smoke or have quit within the past 15 years.  Fecal occult blood test (FOBT) of the stool. You may have this test every year starting at age 51.  Flexible sigmoidoscopy or colonoscopy. You may have a sigmoidoscopy every 5 years or a colonoscopy every 10 years starting at age 42.  Prostate cancer screening. Recommendations will vary depending on your family history and other risks.  Hepatitis C blood test.  Hepatitis B blood test.  Sexually transmitted disease (STD) testing.  Diabetes screening. This is done by checking your blood sugar (glucose) after you have not eaten for a while (fasting). You may have this done every 1-3 years.  Abdominal aortic aneurysm (AAA) screening. You may need this if you are a current or former smoker.  Osteoporosis. You may be screened starting at age 74 if you are at high risk. Talk with your health care provider about your test results, treatment options, and if necessary, the need for more tests. Vaccines  Your health care provider may recommend certain vaccines, such as:  Influenza vaccine. This is recommended every year.  Tetanus, diphtheria, and acellular pertussis (Tdap, Td) vaccine. You may need a Td booster every  10 years.  Zoster vaccine. You may need this after age 5.  Pneumococcal 13-valent conjugate (PCV13) vaccine. One dose is recommended after age 12.  Pneumococcal polysaccharide (PPSV23) vaccine. One dose is recommended after  age 31. Talk to your health care provider about which screenings and vaccines you need and how often you need them. This information is not intended to replace advice given to you by your health care provider. Make sure you discuss any questions you have with your health care provider. Document Released: 03/16/2015 Document Revised: 11/07/2015 Document Reviewed: 12/19/2014 Elsevier Interactive Patient Education  2017 [employer] Prevention in the Home Falls can cause injuries. They can happen to people of all ages. There are many things you can do to make your home safe and to help prevent falls. What can I do on the outside of my home?  Regularly fix the edges of walkways and driveways and fix any cracks.  Remove anything that might make you trip as you walk through a door, such as a raised step or threshold.  Trim any bushes or trees on the path to your home.  Use bright outdoor lighting.  Clear any walking paths of anything that might make someone trip, such as rocks or tools.  Regularly check to see if handrails are loose or broken. Make sure that both sides of any steps have handrails.  Any raised decks and porches should have guardrails on the edges.  Have any leaves, snow, or ice cleared regularly.  Use sand or salt on walking paths during winter.  Clean up any spills in your garage right away. This includes oil or grease spills. What can I do in the bathroom?  Use night lights.  Install grab bars by the toilet and in the tub and shower. Do not use towel bars as grab bars.  Use non-skid mats or decals in the tub or shower.  If you need to sit down in the shower, use a plastic, non-slip stool.  Keep the floor dry. Clean up any water that spills on the floor as soon as it happens.  Remove soap buildup in the tub or shower regularly.  Attach bath mats securely with double-sided non-slip rug tape.  Do not have throw rugs and other things on the floor that can  make you trip. What can I do in the bedroom?  Use night lights.  Make sure that you have a light by your bed that is easy to reach.  Do not use any sheets or blankets that are too big for your bed. They should not hang down onto the floor.  Have a firm chair that has side arms. You can use this for support while you get dressed.  Do not have throw rugs and other things on the floor that can make you trip. What can I do in the kitchen?  Clean up any spills right away.  Avoid walking on wet floors.  Keep items that you use a lot in easy-to-reach places.  If you need to reach something above you, use a strong step stool that has a grab bar.  Keep electrical cords out of the way.  Do not use floor polish or wax that makes floors slippery. If you must use wax, use non-skid floor wax.  Do not have throw rugs and other things on the floor that can make you trip. What can I do with my stairs?  Do not leave any items on the stairs.  Make sure  that there are handrails on both sides of the stairs and use them. Fix handrails that are broken or loose. Make sure that handrails are as long as the stairways.  Check any carpeting to make sure that it is firmly attached to the stairs. Fix any carpet that is loose or worn.  Avoid having throw rugs at the top or bottom of the stairs. If you do have throw rugs, attach them to the floor with carpet tape.  Make sure that you have a light switch at the top of the stairs and the bottom of the stairs. If you do not have them, ask someone to add them for you. What else can I do to help prevent falls?  Wear shoes that:  Do not have high heels.  Have rubber bottoms.  Are comfortable and fit you well.  Are closed at the toe. Do not wear sandals.  If you use a stepladder:  Make sure that it is fully opened. Do not climb a closed stepladder.  Make sure that both sides of the stepladder are locked into place.  Ask someone to hold it for you,  if possible.  Clearly mark and make sure that you can see:  Any grab bars or handrails.  First and last steps.  Where the edge of each step is.  Use tools that help you move around (mobility aids) if they are needed. These include:  Canes.  Walkers.  Scooters.  Crutches.  Turn on the lights when you go into a dark area. Replace any light bulbs as soon as they burn out.  Set up your furniture so you have a clear path. Avoid moving your furniture around.  If any of your floors are uneven, fix them.  If there are any pets around you, be aware of where they are.  Review your medicines with your doctor. Some medicines can make you feel dizzy. This can increase your chance of falling. Ask your doctor what other things that you can do to help prevent falls. This information is not intended to replace advice given to you by your health care provider. Make sure you discuss any questions you have with your health care provider. Document Released: 12/14/2008 Document Revised: 07/26/2015 Document Reviewed: 03/24/2014 Elsevier Interactive Patient Education  2017 [employer].

## 2018-09-15 NOTE — Progress Notes (Signed)
Subjective:   Shawn Fernandez is a 73 y.o. male who presents for Medicare Annual/Subsequent preventive examination.  This visit type was conducted due to national recommendations for restrictions regarding the COVID-19 Pandemic (e.g. social distancing). This format is felt to be most appropriate for this patient at this time. All issues noted in this document were discussed and addressed. No physical exam was performed (except for noted visual exam findings with Video Visits). This patient, Mr. Shawn Fernandez, has given permission to perform this visit via telephone. Vital signs may be absent or patient reported.  Patient location:  At home  Nurse location:  [place] office     Review of Systems:  n/a Cardiac Risk Factors include: advanced age (>21men, >36 women);dyslipidemia;hypertension;male gender     Objective:    Vitals: Ht 5\' 9"  (1.753 m) Comment: per patient  Wt 159 lb (72.1 kg) Comment: per patient  BMI 23.48 kg/m   Body mass index is 23.48 kg/m.  Advanced Directives 09/15/2018 12/16/2017 03/21/2016 03/29/2015 03/15/2015 08/08/2013  Does Patient Have a Medical Advance Directive? Yes Yes No No Yes Patient has advance directive, copy not in chart  Type of Advance Directive [place];Living will Living will - - - [place];Living will  Does patient want to make changes to medical advance directive? - No - Patient declined - - - No change requested  Copy of [place] in Chart? No - copy requested - - No - copy requested No - copy requested -    Tobacco Social History   Tobacco Use  Smoking Status Former Smoker  . Packs/day: 0.75  . Years: 20.00  . Pack years: 15.00  . Types: Cigarettes  . Quit date: 07/01/2009  . Years since quitting: 9.2  Smokeless Tobacco Never Used     Counseling given: Not Answered   Clinical Intake:  Pre-visit preparation completed: Yes  Pain : No/denies pain     Nutritional Status: BMI  of 19-24  Normal Nutritional Risks: None Diabetes: No  How often do you need to have someone help you when you read instructions, pamphlets, or other written materials from your doctor or pharmacy?: 1 - Never What is the last grade level you completed in school?: some college  Interpreter Needed?: No  Information entered by :: NAllen LPN  Past Medical History:  Diagnosis Date  . Blood clot in abdominal vein    treated at [facility]  . Carotid artery disease ([place])   . Dyslipidemia   . FHx: heart disease   . History of blood transfusion 8-9 yrs ago  . HTN (hypertension)   . Peripheral vascular disease [facility])    Past Surgical History:  Procedure Laterality Date  . ANKLE SURGERY Right yrs ago  . arm surgery Left yrs ago   2 rods inserted, 1 rod later removed  . COLONOSCOPY WITH PROPOFOL N/A 09/16/2013   Procedure: COLONOSCOPY WITH PROPOFOL;  Surgeon: Beryle Beams, MD;  Location: [facility] ENDOSCOPY;  Service: Endoscopy;  Laterality: N/A;  . LOWER EXTREMITY ANGIOGRAM N/A 07/24/2011   Procedure: LOWER EXTREMITY ANGIOGRAM;  Surgeon: Lorretta Harp, MD;  Location: [facility] CATH LAB;  Service: Cardiovascular;  Laterality: N/A;  . stent in abdominal vein  4-5 yrs ago  . US ECHOCARDIOGRAPHY  07-20-2007   EF 55-60%   Family History  Problem Relation Age of Onset  . Heart disease Brother   . Heart disease Mother   . Cancer Father        ?  type   Social History   Socioeconomic History  . Marital status: Married    Spouse name: Not on file  . Number of children: Not on file  . Years of education: Not on file  . Highest education level: Not on file  Occupational History  . Occupation: retired  Scientific laboratory technician  . Financial resource strain: Not hard at all  . Food insecurity    Worry: Never true    Inability: Never true  . Transportation needs    Medical: No    Non-medical: No  Tobacco Use  . Smoking status: Former Smoker    Packs/day: 0.75    Years: 20.00    Pack years: 15.00    Types:  Cigarettes    Quit date: 07/01/2009    Years since quitting: 9.2  . Smokeless tobacco: Never Used  Substance and Sexual Activity  . Alcohol use: Yes    Comment: only occ  . Drug use: No  . Sexual activity: Yes  Lifestyle  . Physical activity    Days per week: 7 days    Minutes per session: 30 min  . Stress: Not at all  Relationships  . Social Herbalist on phone: Not on file    Gets together: Not on file    Attends religious service: Not on file    Active member of club or organization: Not on file    Attends meetings of clubs or organizations: Not on file    Relationship status: Not on file  Other Topics Concern  . Not on file  Social History Narrative  . Not on file    Outpatient Encounter Medications as of 09/15/2018  Medication Sig  . aspirin EC 81 MG tablet Take 81 mg by mouth daily.  . Cholecalciferol (VITAMIN D3) 1000 units CAPS Take 1 capsule by mouth daily.  . cilostazol (PLETAL) 50 MG tablet Take 50 mg by mouth every morning.  . clopidogrel (PLAVIX) 75 MG tablet TAKE 1 TABLET BY MOUTH EVERY DAY  . iron polysaccharides (NIFEREX) 150 MG capsule Take 1 capsule (150 mg total) by mouth daily.  Marland Kitchen levothyroxine (SYNTHROID) 137 MCG tablet Take 1 tablet by mouth Monday - Saturday  . losartan (COZAAR) 100 MG tablet TAKE 1 TABLET BY MOUTH EVERY DAY  . Multiple Vitamin (MULTIVITAMIN WITH MINERALS) TABS tablet Take 1 tablet by mouth daily.  Marland Kitchen omeprazole (PRILOSEC) 20 MG capsule Take 20 mg by mouth daily.  . pantoprazole (PROTONIX) 40 MG tablet TAKE 1 TABLET BY MOUTH EVERY DAY  . simvastatin (ZOCOR) 20 MG tablet TAKE 1 TABLET BY ORAL ROUTE EVERY DAY IN THE EVENING  . amLODipine (NORVASC) 10 MG tablet Take 1 tablet (10 mg total) by mouth every morning.   No facility-administered encounter medications on file as of 09/15/2018.     Activities of Daily Living In your present state of health, do you have any difficulty performing the following activities: 09/15/2018  12/16/2017  Hearing? N N  Vision? N N  Difficulty concentrating or making decisions? N N  Walking or climbing stairs? N N  Dressing or bathing? N N  Doing errands, shopping? N N  Preparing Food and eating ? N N  Using the Toilet? N N  In the past six months, have you accidently leaked urine? N N  Do you have problems with loss of bowel control? N N  Managing your Medications? N N  Managing your Finances? N N  Housekeeping or managing your Housekeeping?  N N  Some recent data might be hidden    Patient Care Team: [name], MD as PCP - General (Internal Medicine) Lorretta Harp, MD as Consulting Physician (Cardiology)   Assessment:   This is a routine wellness examination for Jiyan.  Exercise Activities and Dietary recommendations Current Exercise Habits: Home exercise routine, Type of exercise: walking, Time (Minutes): 30, Frequency (Times/Week): 7, Weekly Exercise (Minutes/Week): 210, Intensity: Mild  Goals    . Patient Stated     Stay healthy       Fall Risk Fall Risk  09/15/2018 06/17/2018 12/16/2017  Falls in the past year? 0 0 No  Risk for fall due to : Medication side effect - Medication side effect  Follow up Falls evaluation completed;Falls prevention discussed - -   Is the patient's home free of loose throw rugs in walkways, pet beds, electrical cords, etc?   yes      Grab bars in the bathroom? no      Handrails on the stairs?   yes      Adequate lighting?   yes  Timed Get Up and Go Performed: n/a  Depression Screen PHQ 2/9 Scores 09/15/2018 06/17/2018 12/16/2017  PHQ - 2 Score 0 0 0  PHQ- 9 Score 0 - -    Cognitive Function     6CIT Screen 09/15/2018 12/16/2017  What Year? 0 points 0 points  What month? 0 points 0 points  What time? 0 points 0 points  Count back from 20 0 points 0 points  Months in reverse 0 points 0 points  Repeat phrase 0 points 0 points  Total Score 0 0    Immunization History  Administered Date(s) Administered  .  Influenza-Unspecified 12/04/2017  . Pneumococcal Conjugate-13 12/04/2017  . Pneumococcal-Unspecified 11/30/2014    Qualifies for Shingles Vaccine? yes  Screening Tests Health Maintenance  Topic Date Due  . Hepatitis C Screening  1945-03-25  . INFLUENZA VACCINE  10/02/2018  . TETANUS/TDAP  11/17/2022  . COLONOSCOPY  09/17/2023  . PNA vac Low Risk Adult  Completed   Cancer Screenings: Lung: Low Dose CT Chest recommended if Age 40-80 years, 30 pack-year currently smoking OR have quit w/in 15years. Patient does not qualify. Colorectal: up to date  Additional Screenings:  Hepatitis C Screening: due      Plan:    6 CIT was 0.   I have personally reviewed and noted the following in the patient's chart:   . Medical and social history . Use of alcohol, tobacco or illicit drugs  . Current medications and supplements . Functional ability and status . Nutritional status . Physical activity . Advanced directives . List of other physicians . Hospitalizations, surgeries, and ER visits in previous 12 months . Vitals . Screenings to include cognitive, depression, and falls . Referrals and appointments  In addition, I have reviewed and discussed with patient certain preventive protocols, quality metrics, and best practice recommendations. A written personalized care plan for preventive services as well as general preventive health recommendations were provided to patient.     Kellie Simmering, LPN  9/50/[id number]

## 2018-10-11 ENCOUNTER — Telehealth: Payer: Self-pay

## 2018-10-11 NOTE — Telephone Encounter (Signed)
The pt's sister-in-law Maureen Chatters called and said that  The pt has been possibly  exposed to the coronavirus because his one of his nephew's coworkers tested positive for the virus and his whole job has to be tested.  The pt's sister-in-law was told that Laurance Flatten, NP said to let the office know if the pt's nephew  has a positive test result so that the pt can be sent for covid testing.

## 2018-10-14 ENCOUNTER — Telehealth: Payer: Self-pay

## 2018-10-14 NOTE — Telephone Encounter (Signed)
The pt's sister-in-law Maureen Chatters called and left a message that the pt's nephew tested negative for the coronavirus so the pt is ok.

## 2018-11-03 ENCOUNTER — Other Ambulatory Visit: Payer: Self-pay

## 2018-11-03 ENCOUNTER — Ambulatory Visit (INDEPENDENT_AMBULATORY_CARE_PROVIDER_SITE_OTHER): Payer: Medicare Other | Admitting: Internal Medicine

## 2018-11-03 ENCOUNTER — Encounter: Payer: Self-pay | Admitting: Internal Medicine

## 2018-11-03 VITALS — BP 138/74 | HR 62 | Temp 97.8°F | Ht 67.8 in | Wt 142.6 lb

## 2018-11-03 DIAGNOSIS — R634 Abnormal weight loss: Secondary | ICD-10-CM | POA: Diagnosis not present

## 2018-11-03 DIAGNOSIS — Z86010 Personal history of colonic polyps: Secondary | ICD-10-CM

## 2018-11-03 DIAGNOSIS — Z1211 Encounter for screening for malignant neoplasm of colon: Secondary | ICD-10-CM

## 2018-11-03 DIAGNOSIS — Z79899 Other long term (current) drug therapy: Secondary | ICD-10-CM | POA: Diagnosis not present

## 2018-11-03 DIAGNOSIS — E039 Hypothyroidism, unspecified: Secondary | ICD-10-CM | POA: Diagnosis not present

## 2018-11-03 MED ORDER — LEVOTHYROXINE SODIUM 125 MCG PO TABS: 125.0000 ug | ORAL_TABLET | Freq: Every day | ORAL | 11 refills | Status: DC

## 2018-11-04 LAB — CBC WITH DIFFERENTIAL/PLATELET
Basophils Absolute: 0.1 10*3/uL (ref 0.0–0.2)
Basos: 2 %
EOS (ABSOLUTE): 0.2 10*3/uL (ref 0.0–0.4)
Eos: 4 %
Hematocrit: 26.7 % — ABNORMAL LOW (ref 37.5–51.0)
Hemoglobin: 7.7 g/dL — ABNORMAL LOW (ref 13.0–17.7)
Immature Grans (Abs): 0 10*3/uL (ref 0.0–0.1)
Immature Granulocytes: 0 %
Lymphocytes Absolute: 2.1 10*3/uL (ref 0.7–3.1)
Lymphs: 37 %
MCH: 21.5 pg — ABNORMAL LOW (ref 26.6–33.0)
MCHC: 28.8 g/dL — ABNORMAL LOW (ref 31.5–35.7)
MCV: 75 fL — ABNORMAL LOW (ref 79–97)
Monocytes Absolute: 0.5 10*3/uL (ref 0.1–0.9)
Monocytes: 10 %
Neutrophils Absolute: 2.6 10*3/uL (ref 1.4–7.0)
Neutrophils: 47 %
Platelets: 258 10*3/uL (ref 150–450)
RBC: 3.58 x10E6/uL — ABNORMAL LOW (ref 4.14–5.80)
RDW: 15.2 % (ref 11.6–15.4)
WBC: 5.5 10*3/uL (ref 3.4–10.8)

## 2018-11-04 LAB — TSH: TSH: 0.173 u[IU]/mL — ABNORMAL LOW (ref 0.450–4.500)

## 2018-11-04 LAB — HEPATITIS C ANTIBODY: Hep C Virus Ab: <0.1 s/co ratio (ref 0.0–0.9)

## 2018-11-04 LAB — T4, FREE: Free T4: 1.65 ng/dL (ref 0.82–1.77)

## 2018-11-04 LAB — PREALBUMIN: PREALBUMIN: 23 mg/dL (ref 9–32)

## 2018-11-06 ENCOUNTER — Other Ambulatory Visit: Payer: Self-pay | Admitting: Nurse Practitioner

## 2018-11-08 NOTE — Progress Notes (Signed)
Subjective:     Patient ID: Shawn Fernandez , male    DOB: January 01, 1946 , 73 y.o.   MRN: [id number]   Chief Complaint  Patient presents with  . Thyroid Problem    wife concerned with weight loss    HPI  He is here for a thyroid check.  He is here b/c his wife is concerned about his weight loss.  He states she saw him get undressed and became concerned b/c she saw his ribs. He denies change in his appetite. He denies seeing blood in his stools. He also denies a change in his energy level.  Thyroid Problem Presents for follow-up visit. Symptoms include weight loss. Patient reports no anxiety, cold intolerance or constipation.     Past Medical History:  Diagnosis Date  . Blood clot in abdominal vein    treated at [facility]  . Carotid artery disease ([place])   . Dyslipidemia   . FHx: heart disease   . History of blood transfusion 8-9 yrs ago  . HTN (hypertension)   . Peripheral vascular disease ([place])      Family History  Problem Relation Age of Onset  . Heart disease Brother   . Heart disease Mother   . Cancer Father        ? type     Current Outpatient Medications:  .  amLODipine (NORVASC) 10 MG tablet, Take 1 tablet (10 mg total) by mouth every morning., Disp: 90 tablet, Rfl: 2 .  aspirin EC 81 MG tablet, Take 81 mg by mouth daily., Disp: , Rfl:  .  Cholecalciferol (VITAMIN D3) 1000 units CAPS, Take 1 capsule by mouth daily., Disp: , Rfl:  .  cilostazol (PLETAL) 50 MG tablet, Take 50 mg by mouth every morning., Disp: , Rfl:  .  clopidogrel (PLAVIX) 75 MG tablet, TAKE 1 TABLET BY MOUTH EVERY DAY, Disp: 90 tablet, Rfl: 1 .  iron polysaccharides (NIFEREX) 150 MG capsule, Take 1 capsule (150 mg total) by mouth daily., Disp: 30 capsule, Rfl: 5 .  levothyroxine (SYNTHROID) 125 MCG tablet, Take 1 tablet (125 mcg total) by mouth daily., Disp: 30 tablet, Rfl: 11 .  losartan (COZAAR) 100 MG tablet, TAKE 1 TABLET BY MOUTH EVERY DAY, Disp: 90 tablet, Rfl: 0 .  Multiple Vitamin  (MULTIVITAMIN WITH MINERALS) TABS tablet, Take 1 tablet by mouth daily., Disp: , Rfl:  .  omeprazole (PRILOSEC) 20 MG capsule, Take 20 mg by mouth daily., Disp: , Rfl:  .  pantoprazole (PROTONIX) 40 MG tablet, TAKE 1 TABLET BY MOUTH EVERY DAY, Disp: 90 tablet, Rfl: 2 .  simvastatin (ZOCOR) 20 MG tablet, TAKE 1 TABLET BY ORAL ROUTE EVERY DAY IN THE EVENING, Disp: 90 tablet, Rfl: 1   Allergies  Allergen Reactions  . Lisinopril Cough     Review of Systems  Constitutional: Positive for unexpected weight change and weight loss.  Respiratory: Negative.   Cardiovascular: Negative.   Gastrointestinal: Negative.  Negative for constipation.  Endocrine: Negative for cold intolerance.  Neurological: Negative.   Psychiatric/Behavioral: Negative.  The patient is not nervous/anxious.      Today's Vitals   11/03/18 1549  BP: 138/74  Pulse: 62  Temp: 97.8 F (36.6 C)  TempSrc: Oral  SpO2: 93%  Weight: 142 lb 9.6 oz (64.7 kg)  Height: 5' 7.8" (1.722 m)   Body mass index is 21.81 kg/m.   Objective:  Physical Exam Vitals signs and nursing note reviewed.  Constitutional:      Appearance: Normal  appearance.  Cardiovascular:     Rate and Rhythm: Normal rate and regular rhythm.     Heart sounds: Normal heart sounds.  Pulmonary:     Effort: Pulmonary effort is normal.     Breath sounds: Normal breath sounds.  Skin:    General: Skin is warm.  Neurological:     General: No focal deficit present.     Mental Status: He is alert.  Psychiatric:        Mood and Affect: Mood normal.         Assessment And Plan:     1. Recent unexplained weight loss  I will check labs as listed below.  I will make further recommendations once his labs are available for review.   - Prealbumin - CBC with Diff  2. Primary hypothyroidism  Pt advised that if his dose is supratherapeutic, this could contribute to his weight loss. I will check thyroid panel and adjust meds as needed.   - TSH - T4,  Free  3. Drug therapy  - Hepatitis C antibody  4. Colon cancer screening  Pt's last colonoscopy was in 2015 and significant for polyps. I will refer him to GI for CRC screening. He is agreeable to the referral.   - Ambulatory referral to Gastroenterology        [name], MD    THE PATIENT IS ENCOURAGED TO PRACTICE SOCIAL DISTANCING DUE TO THE COVID-19 PANDEMIC.

## 2018-11-09 ENCOUNTER — Other Ambulatory Visit: Payer: Self-pay

## 2018-11-09 ENCOUNTER — Ambulatory Visit (INDEPENDENT_AMBULATORY_CARE_PROVIDER_SITE_OTHER): Payer: Medicare Other | Admitting: Internal Medicine

## 2018-11-09 ENCOUNTER — Encounter: Payer: Self-pay | Admitting: Internal Medicine

## 2018-11-09 VITALS — Temp 98.4°F | Ht 67.8 in | Wt 144.2 lb

## 2018-11-09 DIAGNOSIS — D649 Anemia, unspecified: Secondary | ICD-10-CM | POA: Diagnosis not present

## 2018-11-09 DIAGNOSIS — R634 Abnormal weight loss: Secondary | ICD-10-CM

## 2018-11-09 DIAGNOSIS — T671XXA Heat syncope, initial encounter: Secondary | ICD-10-CM

## 2018-11-09 DIAGNOSIS — Z1211 Encounter for screening for malignant neoplasm of colon: Secondary | ICD-10-CM | POA: Diagnosis not present

## 2018-11-09 DIAGNOSIS — R195 Other fecal abnormalities: Secondary | ICD-10-CM

## 2018-11-09 LAB — HEMOCCULT GUIAC POC 1CARD (OFFICE)
Card #1 Date: 9052020
Card #2 Date: 9052020
Card #2 Fecal Occult Blod, POC: POSITIVE
Card #3 Date: 9062020
Card #3 Fecal Occult Blood, POC: POSITIVE
Fecal Occult Blood, POC: POSITIVE — AB

## 2018-11-10 ENCOUNTER — Ambulatory Visit (INDEPENDENT_AMBULATORY_CARE_PROVIDER_SITE_OTHER): Payer: Medicare Other | Admitting: Cardiovascular Disease

## 2018-11-10 ENCOUNTER — Encounter: Payer: Self-pay | Admitting: Cardiovascular Disease

## 2018-11-10 DIAGNOSIS — E782 Mixed hyperlipidemia: Secondary | ICD-10-CM | POA: Diagnosis not present

## 2018-11-10 DIAGNOSIS — I6523 Occlusion and stenosis of bilateral carotid arteries: Secondary | ICD-10-CM | POA: Diagnosis not present

## 2018-11-10 DIAGNOSIS — I739 Peripheral vascular disease, unspecified: Secondary | ICD-10-CM

## 2018-11-10 DIAGNOSIS — I10 Essential (primary) hypertension: Secondary | ICD-10-CM

## 2018-11-10 LAB — IRON AND TIBC
Iron Saturation: 3 % — CL (ref 15–55)
Iron: 14 ug/dL — ABNORMAL LOW (ref 38–169)
Total Iron Binding Capacity: 450 ug/dL (ref 250–450)
UIBC: 436 ug/dL — ABNORMAL HIGH (ref 111–343)

## 2018-11-10 LAB — FERRITIN: Ferritin: 13 ng/mL — ABNORMAL LOW (ref 30–400)

## 2018-11-10 NOTE — Assessment & Plan Note (Signed)
History of hyperlipidemia on statin therapy with lipid profile performed 06/17/2018 revealing total cholesterol 169, LDL of 88 and HDL of 68.

## 2018-11-10 NOTE — Assessment & Plan Note (Signed)
History of moderate bilateral ICA stenosis which has remained stable by duplex ultrasound most recently performed 09/09/2018.  This will be repeated on an annual basis.

## 2018-11-10 NOTE — Assessment & Plan Note (Signed)
History of essential hypertension with blood pressure measured today at 166/64.  He is on amlodipine and losartan.  He says that his pressures up because he drank coffee this morning.  Blood pressures in the past have been better than this.

## 2018-11-10 NOTE — Assessment & Plan Note (Signed)
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  He denies abdominal pain.

## 2018-11-10 NOTE — Assessment & Plan Note (Signed)
History of PAD status post peripheral angiography performed by myself 07/24/2011 revealing patent SMA stents, and 60% right external iliac artery stenosis without a pullback gradient and diffuse SFA disease bilaterally in the 50% range as well as tibial vessel disease with two-vessel runoff on the right and 1 on the left.  He denies claudication.  He remains on Pletal.

## 2018-11-10 NOTE — Patient Instructions (Signed)
Medication Instructions:  Your physician recommends that you continue on your current medications as directed. Please refer to the Current Medication list given to you today.  If you need a refill on your cardiac medications before your next appointment, please call your pharmacy.   Lab work: none If you have labs (blood work) drawn today and your tests are completely normal, you will receive your results only by: Marland Kitchen MyChart Message (if you have MyChart) OR . A paper copy in the mail If you have any lab test that is abnormal or we need to change your treatment, we will call you to review the results.  Testing/Procedures: Your physician has requested that you have a carotid duplex. This test is an ultrasound of the carotid arteries in your neck. It looks at blood flow through these arteries that supply the brain with blood. Allow one hour for this exam. There are no restrictions or special instructions.  TO BE REPEATED IN July 2021  Follow-Up: At [facility], you and your health needs are our priority.  As part of our continuing mission to provide you with exceptional heart care, we have created designated Provider Care Teams.  These Care Teams include your primary Cardiologist (physician) and Advanced Practice Providers (APPs -  Physician Assistants and Nurse Practitioners) who all work together to provide you with the care you need, when you need it. You will need a follow up appointment in 12 months with Dr. Quay Burow.  Please call our office 2 months in advance to schedule this appointment.  PLEASE MAKE SURE YOUR ULTRASOUND IS COMPLETED BEFORE THIS APPOINTMENT.

## 2018-11-10 NOTE — Progress Notes (Signed)
11/10/2018 Shawn Fernandez   [birth year]  [id number]  Primary Physician [name], MD Primary Cardiologist: Lorretta Harp MD FACP, [place], [place], [place]  HPI:  Shawn Fernandez is a 73 y.o. thin appearing married African American male father of 84, grandfather to 2 grandchildren who I I last saw in the office 07/19/2014.  He is a retired Administrator, originally referred to me by Dr. Karlton Lemon for peripheral vascular evaluation and claudication. His risk factors include hypertension, hyperlipidemia, and off and on tobacco abuse as well as family history. 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 He also complained of right greater than left lower extremity claudication at less than 1 block. He had Dopplers in our office which showed ABIs in the 0.7 range bilaterally. I angiogrammed him Jul 24, 2011 revealing patent stents to his SMA with 50% "in-stent restenosis." He had 60% stenosis in his right external iliac artery without a pullback gradient and diffuse SFA disease in the 50% range, as well as tibial disease with 2-vessel runoff on the right and 1 on the right. He is on Pletal. He currently denies claudication. There were no focal lesions which I could identify as "culprit" lesions warranting percutaneous intervention. hhe did stop smoking approximately one year ago. He denies right leg; claudication but does complain of left calf claudication. He is on Pletal. He has 1 vessel runoff below the knee on the left peroneal which has 50-60% proximal stenosis.  I last saw him in the office 4 years ago.  He is remained stable since that time.  He denies chest pain, shortness of breath, claudication or abdominal pain.   Current Meds  Medication Sig  . aspirin EC 81 MG tablet Take 81 mg by mouth daily.  . Cholecalciferol (VITAMIN D3) 1000 units CAPS Take 1 capsule by mouth daily.  . cilostazol (PLETAL) 50 MG tablet Take 50 mg by mouth every  morning.  . clopidogrel (PLAVIX) 75 MG tablet TAKE 1 TABLET BY MOUTH EVERY DAY  . iron polysaccharides (NIFEREX) 150 MG capsule Take 1 capsule (150 mg total) by mouth daily.  Marland Kitchen levothyroxine (SYNTHROID) 125 MCG tablet Take 1 tablet (125 mcg total) by mouth daily.  Marland Kitchen losartan (COZAAR) 100 MG tablet TAKE 1 TABLET BY MOUTH EVERY DAY  . Multiple Vitamin (MULTIVITAMIN WITH MINERALS) TABS tablet Take 1 tablet by mouth daily.  Marland Kitchen omeprazole (PRILOSEC) 20 MG capsule Take 20 mg by mouth daily.  . pantoprazole (PROTONIX) 40 MG tablet TAKE 1 TABLET BY MOUTH EVERY DAY  . simvastatin (ZOCOR) 20 MG tablet TAKE 1 TABLET BY ORAL ROUTE EVERY DAY IN THE EVENING     Allergies  Allergen Reactions  . Lisinopril Cough    Social History   Socioeconomic History  . Marital status: Married    Spouse name: Not on file  . Number of children: Not on file  . Years of education: Not on file  . Highest education level: Not on file  Occupational History  . Occupation: retired  Scientific laboratory technician  . Financial resource strain: Not hard at all  . Food insecurity    Worry: Never true    Inability: Never true  . Transportation needs    Medical: No    Non-medical: No  Tobacco Use  . Smoking status: Former Smoker    Packs/day: 0.75    Years: 20.00    Pack years: 15.00    Types: Cigarettes  Quit date: 07/01/2009    Years since quitting: 9.3  . Smokeless tobacco: Never Used  Substance and Sexual Activity  . Alcohol use: Yes    Comment: only occ  . Drug use: No  . Sexual activity: Yes  Lifestyle  . Physical activity    Days per week: 7 days    Minutes per session: 30 min  . Stress: Not at all  Relationships  . Social Herbalist on phone: Not on file    Gets together: Not on file    Attends religious service: Not on file    Active member of club or organization: Not on file    Attends meetings of clubs or organizations: Not on file    Relationship status: Not on file  . Intimate partner violence     Fear of current or ex partner: No    Emotionally abused: No    Physically abused: No    Forced sexual activity: No  Other Topics Concern  . Not on file  Social History Narrative  . Not on file     Review of Systems: General: negative for chills, fever, night sweats or weight changes.  Cardiovascular: negative for chest pain, dyspnea on exertion, edema, orthopnea, palpitations, paroxysmal nocturnal dyspnea or shortness of breath Dermatological: negative for rash Respiratory: negative for cough or wheezing Urologic: negative for hematuria Abdominal: negative for nausea, vomiting, diarrhea, bright red blood per rectum, melena, or hematemesis Neurologic: negative for visual changes, syncope, or dizziness All other systems reviewed and are otherwise negative except as noted above.    Blood pressure (!) 166/74, pulse 64, height 5' 7.8" (1.722 m), weight 144 lb 3.2 oz (65.4 kg).  General appearance: alert and no distress Neck: no adenopathy, no JVD and supple, symmetrical, trachea midline Lungs: clear to auscultation bilaterally Heart: regular rate and rhythm, S1, S2 normal, no murmur, click, rub or gallop Extremities: extremities normal, atraumatic, no cyanosis or edema Pulses: 2+ and symmetric Skin: Skin color, texture, turgor normal. No rashes or lesions Neurologic: Alert and oriented X 3, normal strength and tone. Normal symmetric reflexes. Normal coordination and gait  EKG sinus rhythm at 64 without ST or T wave changes.  I personally reviewed this EKG.  ASSESSMENT AND PLAN:   Essential hypertension History of essential hypertension with blood pressure measured today at 166/64.  He is on amlodipine and losartan.  He says that his pressures up because he drank coffee this morning.  Blood pressures in the past have been better than this.  Hyperlipidemia History of hyperlipidemia on statin therapy with lipid profile performed 06/17/2018 revealing total cholesterol 169, LDL of 88  and HDL of 68.  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  He denies abdominal pain.  Carotid artery disease (HCC) History of moderate bilateral ICA stenosis which has remained stable by duplex ultrasound most recently performed 09/09/2018.  This will be repeated on an annual basis.  Claudication [facility]) History of PAD status post peripheral angiography performed by myself 07/24/2011 revealing patent SMA stents, and 60% right external iliac artery stenosis without a pullback gradient and diffuse SFA disease bilaterally in the 50% range as well as tibial vessel disease with two-vessel runoff on the right and 1 on the left.  He denies claudication.  He remains on Pletal.      Lorretta Harp MD [facility], [facility] 11/10/2018 10:01 AM

## 2018-11-12 ENCOUNTER — Telehealth: Payer: Self-pay

## 2018-11-12 NOTE — Telephone Encounter (Signed)
Left message for pt to call back with determination of it her wants to go to the hematologist

## 2018-11-12 NOTE — Telephone Encounter (Signed)
-----   Message from [name], MD sent at 11/10/2018  6:48 PM EDT ----- As suspected, your iron levels are low. I will refer you to hematology for iron infusion since you prefer to not have blood transfusion. Are you okay with this?

## 2018-11-14 NOTE — Progress Notes (Signed)
Subjective:     Patient ID: Shawn Fernandez , male    DOB: 04-03-1945 , 73 y.o.   MRN: [id number]   Chief Complaint  Patient presents with  . fainted    HPI  He is here today for further evaluation after fainting over the weekend. He is accompanied by his wife today. He reports he cut the grass Sunday without eating breakfast. He may have had a cup of coffee.  He did use a riding mower. His wife states he momentarily slumped over in the mower, then aroused himself.  He finished mowing and then decided to pull some weeds.  He felt okay, so he then decided to fry some fish for the family cookout. He still had not eaten. He reports he passed out while bending over the hot fryer. Wife states he did not have any tonic-clonic movements. EMS was called, and EKG was performed. No acute findings were noted so he was not taken to the hospital. He was advised to increase his fluid intake. He reports he felt better after drinking water and Gatorade.     Past Medical History:  Diagnosis Date  . Blood clot in abdominal vein    treated at [facility]  . Carotid artery disease ([place])   . Dyslipidemia   . FHx: heart disease   . History of blood transfusion 8-9 yrs ago  . HTN (hypertension)   . Peripheral vascular disease ([place])      Family History  Problem Relation Age of Onset  . Heart disease Brother   . Heart disease Mother   . Cancer Father        ? type     Current Outpatient Medications:  .  aspirin EC 81 MG tablet, Take 81 mg by mouth daily., Disp: , Rfl:  .  Cholecalciferol (VITAMIN D3) 1000 units CAPS, Take 1 capsule by mouth daily., Disp: , Rfl:  .  cilostazol (PLETAL) 50 MG tablet, Take 50 mg by mouth every morning., Disp: , Rfl:  .  clopidogrel (PLAVIX) 75 MG tablet, TAKE 1 TABLET BY MOUTH EVERY DAY, Disp: 90 tablet, Rfl: 1 .  iron polysaccharides (NIFEREX) 150 MG capsule, Take 1 capsule (150 mg total) by mouth daily., Disp: 30 capsule, Rfl: 5 .  levothyroxine (SYNTHROID) 125 MCG  tablet, Take 1 tablet (125 mcg total) by mouth daily., Disp: 30 tablet, Rfl: 11 .  losartan (COZAAR) 100 MG tablet, TAKE 1 TABLET BY MOUTH EVERY DAY, Disp: 90 tablet, Rfl: 0 .  Multiple Vitamin (MULTIVITAMIN WITH MINERALS) TABS tablet, Take 1 tablet by mouth daily., Disp: , Rfl:  .  omeprazole (PRILOSEC) 20 MG capsule, Take 20 mg by mouth daily., Disp: , Rfl:  .  pantoprazole (PROTONIX) 40 MG tablet, TAKE 1 TABLET BY MOUTH EVERY DAY, Disp: 90 tablet, Rfl: 2 .  simvastatin (ZOCOR) 20 MG tablet, TAKE 1 TABLET BY ORAL ROUTE EVERY DAY IN THE EVENING, Disp: 90 tablet, Rfl: 1 .  amLODipine (NORVASC) 10 MG tablet, Take 1 tablet (10 mg total) by mouth every morning., Disp: 90 tablet, Rfl: 2   Allergies  Allergen Reactions  . Lisinopril Cough     Review of Systems  Constitutional: Negative.   Respiratory: Negative.   Cardiovascular: Negative.   Gastrointestinal: Negative.   Neurological: Positive for syncope.  Psychiatric/Behavioral: Negative.      Today's Vitals   11/09/18 1422  Temp: 98.4 F (36.9 C)  TempSrc: Oral  Weight: 144 lb 3.2 oz (65.4 kg)  Height: 5'  7.8" (1.722 m)  PainSc: 0-No pain   Body mass index is 22.06 kg/m.   Objective:  Physical Exam Vitals signs and nursing note reviewed.  Constitutional:      Appearance: Normal appearance.  Cardiovascular:     Rate and Rhythm: Normal rate and regular rhythm.     Heart sounds: Normal heart sounds.  Pulmonary:     Effort: Pulmonary effort is normal.     Breath sounds: Normal breath sounds.  Skin:    General: Skin is warm.  Neurological:     General: No focal deficit present.     Mental Status: He is alert.  Psychiatric:        Mood and Affect: Mood normal.         Assessment And Plan:     1. Heat syncope, initial encounter  He is encouraged to eat and hydrate PRIOR to cutting grass or performing any other yard work. Unfortunately, I believe this episode was exacerbated by his profound iron def. Anemia. Hb at  last visit was 7.7.    2. Anemia, unspecified type  I will check his iron levels today. I suspect they will be low. He has completed home hemoccult stool cards, all 3 were positive.  His wife has requested referral to Dr. Paulita Fujita for further evaluation. I also recommend Hematology referral since he declines blood transfusion. He reports he has done well with these in the past.   - Ambulatory referral to Gastroenterology - Iron and IBC [id number]) - Ferritin - POCT occult blood stool  3. Recent unexplained weight loss  Pt also reports early satiety. Pt advised that he will need both EGD/colonoscopy.  - Ambulatory referral to Gastroenterology  4. Colon cancer screening  - POCT occult blood stool        [name], MD    THE PATIENT IS ENCOURAGED TO PRACTICE SOCIAL DISTANCING DUE TO THE COVID-19 PANDEMIC.

## 2018-11-17 ENCOUNTER — Encounter: Payer: Self-pay | Admitting: Internal Medicine

## 2018-11-18 ENCOUNTER — Telehealth: Payer: Self-pay | Admitting: Hematology

## 2018-11-18 NOTE — Telephone Encounter (Signed)
Scheduled appt per 9/16 sch message - unable to reach pt- left message with app date and time

## 2018-11-25 ENCOUNTER — Other Ambulatory Visit: Payer: Self-pay | Admitting: *Deleted

## 2018-11-25 DIAGNOSIS — D500 Iron deficiency anemia secondary to blood loss (chronic): Secondary | ICD-10-CM

## 2018-11-26 ENCOUNTER — Inpatient Hospital Stay: Payer: Medicare Other | Attending: Hematology | Admitting: Hematology

## 2018-11-26 ENCOUNTER — Inpatient Hospital Stay: Payer: Medicare Other

## 2018-11-26 ENCOUNTER — Other Ambulatory Visit: Payer: Self-pay | Admitting: *Deleted

## 2018-11-26 ENCOUNTER — Other Ambulatory Visit: Payer: Self-pay

## 2018-11-26 ENCOUNTER — Telehealth: Payer: Self-pay | Admitting: Hematology

## 2018-11-26 VITALS — BP 139/69 | HR 70 | Temp 98.2°F | Resp 17 | Ht 67.8 in | Wt 145.8 lb

## 2018-11-26 DIAGNOSIS — E785 Hyperlipidemia, unspecified: Secondary | ICD-10-CM | POA: Insufficient documentation

## 2018-11-26 DIAGNOSIS — Z8249 Family history of ischemic heart disease and other diseases of the circulatory system: Secondary | ICD-10-CM | POA: Diagnosis not present

## 2018-11-26 DIAGNOSIS — Z87891 Personal history of nicotine dependence: Secondary | ICD-10-CM | POA: Insufficient documentation

## 2018-11-26 DIAGNOSIS — K922 Gastrointestinal hemorrhage, unspecified: Secondary | ICD-10-CM | POA: Diagnosis not present

## 2018-11-26 DIAGNOSIS — D509 Iron deficiency anemia, unspecified: Secondary | ICD-10-CM | POA: Insufficient documentation

## 2018-11-26 DIAGNOSIS — Z7902 Long term (current) use of antithrombotics/antiplatelets: Secondary | ICD-10-CM | POA: Diagnosis not present

## 2018-11-26 DIAGNOSIS — Z79899 Other long term (current) drug therapy: Secondary | ICD-10-CM | POA: Diagnosis not present

## 2018-11-26 DIAGNOSIS — K449 Diaphragmatic hernia without obstruction or gangrene: Secondary | ICD-10-CM | POA: Insufficient documentation

## 2018-11-26 DIAGNOSIS — I10 Essential (primary) hypertension: Secondary | ICD-10-CM | POA: Insufficient documentation

## 2018-11-26 DIAGNOSIS — I714 Abdominal aortic aneurysm, without rupture: Secondary | ICD-10-CM | POA: Diagnosis not present

## 2018-11-26 DIAGNOSIS — D500 Iron deficiency anemia secondary to blood loss (chronic): Secondary | ICD-10-CM | POA: Diagnosis not present

## 2018-11-26 DIAGNOSIS — I739 Peripheral vascular disease, unspecified: Secondary | ICD-10-CM | POA: Insufficient documentation

## 2018-11-26 LAB — CMP (CANCER CENTER ONLY)
ALT: <6 U/L (ref 0–44)
AST: 20 U/L (ref 15–41)
Albumin: 4.2 g/dL (ref 3.5–5.0)
Alkaline Phosphatase: 117 U/L (ref 38–126)
Anion gap: 10 (ref 5–15)
BUN: 15 mg/dL (ref 8–23)
CO2: 26 mmol/L (ref 22–32)
Calcium: 9.7 mg/dL (ref 8.9–10.3)
Chloride: 107 mmol/L (ref 98–111)
Creatinine: 0.98 mg/dL (ref 0.61–1.24)
GFR, Est AFR Am: >60 mL/min (ref >60)
GFR, Estimated: >60 mL/min (ref >60)
Glucose, Bld: 87 mg/dL (ref 70–99)
Potassium: 3.8 mmol/L (ref 3.5–5.1)
Sodium: 143 mmol/L (ref 135–145)
Total Bilirubin: 0.4 mg/dL (ref 0.3–1.2)
Total Protein: 7.7 g/dL (ref 6.5–8.1)

## 2018-11-26 LAB — RETICULOCYTES
Immature Retic Fract: 26.4 % — ABNORMAL HIGH (ref 2.3–15.9)
RBC.: 4.17 MIL/uL — ABNORMAL LOW (ref 4.22–5.81)
Retic Count, Absolute: 118 10*3/uL (ref 19.0–186.0)
Retic Ct Pct: 2.8 % (ref 0.4–3.1)

## 2018-11-26 LAB — CBC WITH DIFFERENTIAL (CANCER CENTER ONLY)
Abs Immature Granulocytes: 0.02 10*3/uL (ref 0.00–0.07)
Basophils Absolute: 0.1 10*3/uL (ref 0.0–0.1)
Basophils Relative: 1 %
Eosinophils Absolute: 0.3 10*3/uL (ref 0.0–0.5)
Eosinophils Relative: 4 %
HCT: 32.8 % — ABNORMAL LOW (ref 39.0–52.0)
Hemoglobin: 9.4 g/dL — ABNORMAL LOW (ref 13.0–17.0)
Immature Granulocytes: 0 %
Lymphocytes Relative: 29 %
Lymphs Abs: 2.2 10*3/uL (ref 0.7–4.0)
MCH: 22.7 pg — ABNORMAL LOW (ref 26.0–34.0)
MCHC: 28.7 g/dL — ABNORMAL LOW (ref 30.0–36.0)
MCV: 79.2 fL — ABNORMAL LOW (ref 80.0–100.0)
Monocytes Absolute: 0.8 10*3/uL (ref 0.1–1.0)
Monocytes Relative: 10 %
Neutro Abs: 4.1 10*3/uL (ref 1.7–7.7)
Neutrophils Relative %: 56 %
Platelet Count: 323 10*3/uL (ref 150–400)
RBC: 4.14 MIL/uL — ABNORMAL LOW (ref 4.22–5.81)
RDW: 23.1 % — ABNORMAL HIGH (ref 11.5–15.5)
WBC Count: 7.4 10*3/uL (ref 4.0–10.5)
nRBC: 0 % (ref 0.0–0.2)

## 2018-11-26 LAB — IRON AND TIBC
Iron: 311 ug/dL — ABNORMAL HIGH (ref 42–163)
Saturation Ratios: 81 % — ABNORMAL HIGH (ref 20–55)
TIBC: 385 ug/dL (ref 202–409)
UIBC: 74 ug/dL — ABNORMAL LOW (ref 117–376)

## 2018-11-26 LAB — FERRITIN: Ferritin: 32 ng/mL (ref 24–336)
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
## 2018-11-26 NOTE — Telephone Encounter (Signed)
Scheduled appt per 9/25 los. ° °Printed calendar and avs. °

## 2018-11-30 ENCOUNTER — Other Ambulatory Visit: Payer: Self-pay | Admitting: Hematology

## 2018-12-03 ENCOUNTER — Inpatient Hospital Stay: Payer: Medicare Other | Attending: Hematology

## 2018-12-03 ENCOUNTER — Other Ambulatory Visit: Payer: Self-pay

## 2018-12-03 VITALS — BP 136/63 | HR 51 | Temp 98.5°F | Resp 18

## 2018-12-03 DIAGNOSIS — D509 Iron deficiency anemia, unspecified: Secondary | ICD-10-CM | POA: Diagnosis not present

## 2018-12-03 DIAGNOSIS — Z79899 Other long term (current) drug therapy: Secondary | ICD-10-CM | POA: Insufficient documentation

## 2018-12-03 DIAGNOSIS — D500 Iron deficiency anemia secondary to blood loss (chronic): Secondary | ICD-10-CM

## 2018-12-03 MED ORDER — LORATADINE 10 MG PO TABS
10.0000 mg | ORAL_TABLET | Freq: Once | ORAL | Status: AC
  Administered 2018-12-03: 10 mg via ORAL

## 2018-12-03 MED ORDER — SODIUM CHLORIDE 0.9 % IV SOLN
Freq: Once | INTRAVENOUS | Status: AC
  Administered 2018-12-03: 08:00:00 via INTRAVENOUS
  Filled 2018-12-03: qty 250

## 2018-12-03 MED ORDER — ACETAMINOPHEN 325 MG PO TABS
ORAL_TABLET | ORAL | Status: AC
  Filled 2018-12-03: qty 2

## 2018-12-03 MED ORDER — LORATADINE 10 MG PO TABS
ORAL_TABLET | ORAL | Status: AC
  Filled 2018-12-03: qty 1

## 2018-12-03 MED ORDER — ACETAMINOPHEN 325 MG PO TABS
650.0000 mg | ORAL_TABLET | Freq: Once | ORAL | Status: AC
  Administered 2018-12-03: 650 mg via ORAL

## 2018-12-03 MED ORDER — SODIUM CHLORIDE 0.9 % IV SOLN
750.0000 mg | Freq: Once | INTRAVENOUS | Status: AC
  Administered 2018-12-03: 750 mg via INTRAVENOUS
  Filled 2018-12-03: qty 15

## 2018-12-03 NOTE — Patient Instructions (Signed)

## 2018-12-10 ENCOUNTER — Inpatient Hospital Stay: Payer: Medicare Other

## 2018-12-10 ENCOUNTER — Other Ambulatory Visit: Payer: Self-pay

## 2018-12-10 VITALS — BP 142/55 | HR 53 | Temp 98.7°F | Resp 17

## 2018-12-10 DIAGNOSIS — D509 Iron deficiency anemia, unspecified: Secondary | ICD-10-CM | POA: Diagnosis not present

## 2018-12-10 DIAGNOSIS — D500 Iron deficiency anemia secondary to blood loss (chronic): Secondary | ICD-10-CM

## 2018-12-10 MED ORDER — LORATADINE 10 MG PO TABS
10.0000 mg | ORAL_TABLET | Freq: Once | ORAL | Status: AC
  Administered 2018-12-10: 10 mg via ORAL

## 2018-12-10 MED ORDER — SODIUM CHLORIDE 0.9 % IV SOLN
750.0000 mg | Freq: Once | INTRAVENOUS | Status: AC
  Administered 2018-12-10: 750 mg via INTRAVENOUS
  Filled 2018-12-10: qty 15

## 2018-12-10 MED ORDER — SODIUM CHLORIDE 0.9 % IV SOLN
Freq: Once | INTRAVENOUS | Status: AC
  Administered 2018-12-10: 14:00:00 via INTRAVENOUS
  Filled 2018-12-10: qty 250

## 2018-12-10 MED ORDER — ACETAMINOPHEN 325 MG PO TABS
ORAL_TABLET | ORAL | Status: AC
  Filled 2018-12-10: qty 2

## 2018-12-10 MED ORDER — ACETAMINOPHEN 325 MG PO TABS
650.0000 mg | ORAL_TABLET | Freq: Once | ORAL | Status: AC
  Administered 2018-12-10: 650 mg via ORAL

## 2018-12-10 NOTE — Progress Notes (Signed)
Pt stated he did not want to stay for the 30 minute post infusion observation period. Pt states he tolerates his infusion fine. Pt vitals as charted. Pt aware to call clinic or seek treatment for any questions or concerns. Pt verbalized understanding and had no further questions. Pt left clinic ambulatory and in no apparent distress.

## 2018-12-10 NOTE — Patient Instructions (Signed)

## 2018-12-16 ENCOUNTER — Other Ambulatory Visit: Payer: Self-pay

## 2018-12-16 ENCOUNTER — Other Ambulatory Visit: Payer: Self-pay | Admitting: Internal Medicine

## 2018-12-16 ENCOUNTER — Other Ambulatory Visit: Payer: Medicare Other

## 2018-12-16 DIAGNOSIS — E039 Hypothyroidism, unspecified: Secondary | ICD-10-CM

## 2018-12-16 MED ORDER — POLYSACCHARIDE IRON COMPLEX 150 MG PO CAPS: 150.0000 mg | ORAL_CAPSULE | Freq: Every day | ORAL | 5 refills | Status: DC

## 2018-12-17 LAB — TSH: TSH: 4.22 u[IU]/mL (ref 0.450–4.500)

## 2018-12-22 ENCOUNTER — Ambulatory Visit: Payer: Medicare Other

## 2018-12-22 ENCOUNTER — Ambulatory Visit: Payer: Medicare Other | Admitting: Internal Medicine

## 2018-12-28 ENCOUNTER — Telehealth: Payer: Self-pay

## 2018-12-28 NOTE — Telephone Encounter (Signed)
The pt's wife Mrs. Spink called and said that the pt has been losing weight with out trying to.  She was told that he has a physical on 01/04/2019 and that he can discuss it at his visit and that I will make a note that she called about the pt having unexplained weight loss.

## 2019-01-04 ENCOUNTER — Encounter: Payer: Self-pay | Admitting: Internal Medicine

## 2019-01-04 ENCOUNTER — Ambulatory Visit: Payer: Medicare Other

## 2019-01-04 ENCOUNTER — Other Ambulatory Visit: Payer: Self-pay

## 2019-01-04 ENCOUNTER — Ambulatory Visit (INDEPENDENT_AMBULATORY_CARE_PROVIDER_SITE_OTHER): Payer: Medicare Other | Admitting: Internal Medicine

## 2019-01-04 ENCOUNTER — Telehealth: Payer: Self-pay | Admitting: *Deleted

## 2019-01-04 VITALS — BP 134/86 | HR 71 | Temp 97.8°F | Ht 67.8 in | Wt 147.4 lb

## 2019-01-04 DIAGNOSIS — Z0000 Encounter for general adult medical examination without abnormal findings: Secondary | ICD-10-CM | POA: Diagnosis not present

## 2019-01-04 DIAGNOSIS — D500 Iron deficiency anemia secondary to blood loss (chronic): Secondary | ICD-10-CM | POA: Diagnosis not present

## 2019-01-04 DIAGNOSIS — I10 Essential (primary) hypertension: Secondary | ICD-10-CM

## 2019-01-04 DIAGNOSIS — I739 Peripheral vascular disease, unspecified: Secondary | ICD-10-CM

## 2019-01-04 DIAGNOSIS — E039 Hypothyroidism, unspecified: Secondary | ICD-10-CM | POA: Diagnosis not present

## 2019-01-04 DIAGNOSIS — I270 Primary pulmonary hypertension: Secondary | ICD-10-CM | POA: Insufficient documentation

## 2019-01-04 LAB — POCT URINALYSIS DIPSTICK
Bilirubin, UA: NEGATIVE
Blood, UA: NEGATIVE
Glucose, UA: NEGATIVE
Ketones, UA: NEGATIVE
Leukocytes, UA: NEGATIVE
Nitrite, UA: NEGATIVE
Protein, UA: NEGATIVE
Spec Grav, UA: 1.015 (ref 1.010–1.025)
Urobilinogen, UA: 0.2 E.U./dL
pH, UA: 7 (ref 5.0–8.0)

## 2019-01-04 LAB — POCT UA - MICROALBUMIN
Albumin/Creatinine Ratio, Urine, POC: 30
Creatinine, POC: 100 mg/dL
Microalbumin Ur, POC: 10 mg/L

## 2019-01-04 NOTE — Patient Instructions (Signed)

## 2019-01-04 NOTE — Telephone Encounter (Signed)
   [facility] Pre-operative Risk Assessment    Request for surgical clearance:  1. What type of surgery is being performed? COLONOSCPY/ENDOSCOPY  FOR IRON DEF ANEMIA   2. When is this surgery scheduled? TBD   3. What type of clearance is required (medical clearance vs. Pharmacy clearance to hold med vs. Both)?   4. Are there any medications that need to be held prior to surgery and how long? TAKES PLAVIX   5. Practice name and name of physician performing surgery? [facility]    6. What is your office phone number? [phone]     7.   What is your office fax number? [phone]  8.   Anesthesia type (None, local, MAC, general) ? PROPOFOL

## 2019-01-05 NOTE — Telephone Encounter (Signed)
Dr. Gwenlyn Found  Can you please comment on holding this patients Plavix prior to colonoscopy/endoscopy for anemia of unknown etiology? You last saw him 11/10/2018 in follow up after a long cardiology absence (since 2016). He has a hx of PVD, HTN, HLD and tobacco use. Also with a hx of mesenteric artery stents.   Please send your recommendations to the pre-op pool.   Thank you Sharee Pimple

## 2019-01-06 NOTE — Telephone Encounter (Signed)
   Primary Cardiologist: Shawn Burow, MD  Chart reviewed as part of pre-operative protocol coverage. Given past medical history and time since last visit, based on ACC/AHA guidelines, Shawn Fernandez would be at acceptable risk for the planned procedure without further cardiovascular testing.   Per Dr. [name], it will be acceptable to hold Plavix therapy 5 to 7 days prior to procedure then resume when okay from a surgical standpoint.  He was last seen by Dr. [name] 11/10/2018 in follow-up and was doing well from a cardiac perspective.  I will route this recommendation to the requesting party via Epic fax function and remove from pre-op pool.  Please call with questions.  Kathyrn Drown, NP 01/06/2019, 1:20 PM

## 2019-01-06 NOTE — Telephone Encounter (Signed)
Okay to hold Plavix for colonoscopy 

## 2019-01-10 NOTE — Progress Notes (Signed)
Subjective:     Patient ID: Shawn Fernandez , male    DOB: [birth year] , 73 y.o.   MRN: [id number]   Chief Complaint  Patient presents with  . Annual Exam  . Hypertension    HPI  He is here today for a full physical exam. He has no specific concerns at this time. Unfortunately, he has yet to have colonoscopy. He was referred to GI in Sept 2020 for CRC screening due to weight loss along with iron deficiency anemia.   Hypertension This is a chronic problem. The current episode started more than 1 year ago. The problem has been gradually improving since onset. The problem is controlled. Pertinent negatives include no blurred vision, chest pain, palpitations or shortness of breath. Risk factors for coronary artery disease include smoking/tobacco exposure, sedentary lifestyle and male gender. The current treatment provides moderate improvement. Compliance problems include exercise.      Past Medical History:  Diagnosis Date  . Blood clot in abdominal vein    treated at [facility]  . Carotid artery disease ([place])   . Dyslipidemia   . FHx: heart disease   . History of blood transfusion 8-9 yrs ago  . HTN (hypertension)   . Peripheral vascular disease ([place])      Family History  Problem Relation Age of Onset  . Heart disease Brother   . Heart disease Mother   . Cancer Father        ? type     Current Outpatient Medications:  .  amLODipine (NORVASC) 10 MG tablet, Take 1 tablet (10 mg total) by mouth every morning., Disp: 90 tablet, Rfl: 2 .  aspirin EC 81 MG tablet, Take 81 mg by mouth daily., Disp: , Rfl:  .  Cholecalciferol (VITAMIN D3) 1000 units CAPS, Take 1 capsule by mouth daily., Disp: , Rfl:  .  cilostazol (PLETAL) 50 MG tablet, Take 50 mg by mouth every morning., Disp: , Rfl:  .  clopidogrel (PLAVIX) 75 MG tablet, TAKE 1 TABLET BY MOUTH EVERY DAY, Disp: 90 tablet, Rfl: 1 .  iron polysaccharides (NIFEREX) 150 MG capsule, Take 1 capsule (150 mg total) by mouth daily., Disp: 30  capsule, Rfl: 5 .  levothyroxine (SYNTHROID) 125 MCG tablet, Take 1 tablet (125 mcg total) by mouth daily., Disp: 30 tablet, Rfl: 11 .  Multiple Vitamin (MULTIVITAMIN WITH MINERALS) TABS tablet, Take 1 tablet by mouth daily., Disp: , Rfl:  .  pantoprazole (PROTONIX) 40 MG tablet, TAKE 1 TABLET BY MOUTH EVERY DAY, Disp: 90 tablet, Rfl: 2 .  simvastatin (ZOCOR) 20 MG tablet, TAKE 1 TABLET BY ORAL ROUTE EVERY DAY IN THE EVENING, Disp: 90 tablet, Rfl: 1 .  losartan (COZAAR) 100 MG tablet, TAKE 1 TABLET BY MOUTH EVERY DAY, Disp: 90 tablet, Rfl: 0   Allergies  Allergen Reactions  . Lisinopril Cough    Men's preventive visit. Patient Health Questionnaire (PHQ-2) is    Office Visit from 11/09/2018 in [facility]  PHQ-2 Total Score  0    . Patient is on a low salt diet. Marital status: Married. Relevant history for alcohol use is:  Social History   Substance and Sexual Activity  Alcohol Use Yes   Comment: only occ  . Relevant history for tobacco use is:  Social History   Tobacco Use  Smoking Status Former Smoker  . Packs/day: 0.75  . Years: 20.00  . Pack years: 15.00  . Types: Cigarettes  . Quit date: 07/01/2009  .  Years since quitting: 9.5  Smokeless Tobacco Never Used  .  Review of Systems  Constitutional: Negative.   HENT: Negative.   Eyes: Negative for blurred vision.  Respiratory: Negative.  Negative for shortness of breath.   Cardiovascular: Negative.  Negative for chest pain and palpitations.  Endocrine: Negative.   Genitourinary: Negative.   Musculoskeletal: Negative.   Skin: Negative.   Allergic/Immunologic: Negative.   Neurological: Negative.   Hematological: Negative.   Psychiatric/Behavioral: Negative.      Today's Vitals   01/04/19 1418  BP: 134/86  Pulse: 71  Temp: 97.8 F (36.6 C)  TempSrc: Oral  Weight: 147 lb 6.4 oz (66.9 kg)  Height: 5' 7.8" (1.722 m)   Body mass index is 22.54 kg/m.   Objective:  Physical Exam Vitals signs  and nursing note reviewed.  Constitutional:      Appearance: Normal appearance.  HENT:     Head: Normocephalic and atraumatic.     Right Ear: Tympanic membrane, ear canal and external ear normal.     Left Ear: Tympanic membrane, ear canal and external ear normal.     Nose: Nose normal.     Mouth/Throat:     Mouth: Mucous membranes are moist.     Pharynx: Oropharynx is clear.  Eyes:     Extraocular Movements: Extraocular movements intact.     Conjunctiva/sclera: Conjunctivae normal.     Pupils: Pupils are equal, round, and reactive to light.  Neck:     Musculoskeletal: Normal range of motion and neck supple.  Cardiovascular:     Rate and Rhythm: Normal rate and regular rhythm.     Pulses: Normal pulses.     Heart sounds: Normal heart sounds.  Pulmonary:     Effort: Pulmonary effort is normal.     Breath sounds: Normal breath sounds.  Chest:     Breasts:        Right: Normal. No swelling, bleeding, inverted nipple, mass or nipple discharge.        Left: Normal. No swelling, bleeding, inverted nipple, mass or nipple discharge.  Abdominal:     General: Abdomen is flat. Bowel sounds are normal.     Palpations: Abdomen is soft.  Genitourinary:    Comments: Deferred  Musculoskeletal: Normal range of motion.     Comments: Congenital deformity LUE  Skin:    General: Skin is warm.  Neurological:     General: No focal deficit present.     Mental Status: He is alert.  Psychiatric:        Mood and Affect: Mood normal.        Behavior: Behavior normal.         Assessment And Plan:     1. Routine general medical examination at health care facility  A full exam was performed. DRE deferred, per patient's request. PATIENT HAS BEEN ADVISED TO GET 30-45 MINUTES REGULAR EXERCISE NO LESS THAN FOUR TO FIVE DAYS PER WEEK - BOTH WEIGHTBEARING EXERCISES AND AEROBIC ARE RECOMMENDED.  HE WAS ADVISED TO FOLLOW A HEALTHY DIET WITH AT LEAST SIX FRUITS/VEGGIES PER DAY, DECREASE INTAKE OF RED  MEAT, AND TO INCREASE FISH INTAKE TO TWO DAYS PER WEEK.  MEATS/FISH SHOULD NOT BE FRIED, BAKED OR BROILED IS PREFERABLE.  I SUGGEST WEARING SPF 50 SUNSCREEN ON EXPOSED PARTS AND ESPECIALLY WHEN IN THE DIRECT SUNLIGHT FOR AN EXTENDED PERIOD OF TIME.  PLEASE AVOID FAST FOOD RESTAURANTS AND INCREASE YOUR WATER INTAKE.   2. Essential hypertension, benign  Chronic, fair  control. He will continue with current meds. He is encouraged to avoid adding salt to his foods. EKG performed, no new changes noted.  He will rto in six months for re-evaluation.   - EKG 12-Lead - POCT Urinalysis Dipstick (81002) - POCT UA - Microalbumin  3. Iron deficiency anemia due to chronic blood loss  He is now followed by Hematology. He is now receiving IV iron infusions. He reports feeling a lot better since starting this treatment. Again, he has yet to hear from GI. [facility] was contacted during his visit, to find out why this has not been scheduled. MD not available for discussion. I was advised that they are behind scheduling patients and he will be seen in [place]. Staff reminded that he needs diagnostic procedure, not a screening procedure. They will now try to see him sooner. He is advised that   4. Acquired hypothyroidism  Previous thyroid results reviewed in full detail. He will continue with current meds. We will re-evaluate labwork within 12 weeks.   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    6. Peripheral arterial disease (HCC)  Chronic. Pt advised he will need to stop meds 5 days prior to his colonoscopy.    [name], MD    THE PATIENT IS ENCOURAGED TO PRACTICE SOCIAL DISTANCING DUE TO THE COVID-19 PANDEMIC.

## 2019-01-20 ENCOUNTER — Telehealth: Payer: Self-pay | Admitting: Hematology

## 2019-01-20 NOTE — Telephone Encounter (Signed)
[place] [facility] 11/24 appointments moved to 12/2. Confirmed with patient

## 2019-01-25 ENCOUNTER — Inpatient Hospital Stay: Payer: Medicare Other | Admitting: Hematology

## 2019-01-25 ENCOUNTER — Inpatient Hospital Stay: Payer: Medicare Other

## 2019-02-02 ENCOUNTER — Inpatient Hospital Stay (HOSPITAL_BASED_OUTPATIENT_CLINIC_OR_DEPARTMENT_OTHER): Payer: Medicare Other | Admitting: Hematology

## 2019-02-02 ENCOUNTER — Inpatient Hospital Stay: Payer: Medicare Other | Attending: Hematology

## 2019-02-02 ENCOUNTER — Other Ambulatory Visit: Payer: Self-pay

## 2019-02-02 VITALS — BP 179/71 | HR 48 | Temp 98.7°F | Resp 18 | Ht 67.8 in | Wt 146.7 lb

## 2019-02-02 DIAGNOSIS — E0590 Thyrotoxicosis, unspecified without thyrotoxic crisis or storm: Secondary | ICD-10-CM | POA: Diagnosis not present

## 2019-02-02 DIAGNOSIS — E785 Hyperlipidemia, unspecified: Secondary | ICD-10-CM | POA: Insufficient documentation

## 2019-02-02 DIAGNOSIS — I714 Abdominal aortic aneurysm, without rupture: Secondary | ICD-10-CM | POA: Insufficient documentation

## 2019-02-02 DIAGNOSIS — K449 Diaphragmatic hernia without obstruction or gangrene: Secondary | ICD-10-CM | POA: Diagnosis not present

## 2019-02-02 DIAGNOSIS — Z79899 Other long term (current) drug therapy: Secondary | ICD-10-CM | POA: Insufficient documentation

## 2019-02-02 DIAGNOSIS — D500 Iron deficiency anemia secondary to blood loss (chronic): Secondary | ICD-10-CM | POA: Diagnosis not present

## 2019-02-02 DIAGNOSIS — K922 Gastrointestinal hemorrhage, unspecified: Secondary | ICD-10-CM

## 2019-02-02 DIAGNOSIS — D509 Iron deficiency anemia, unspecified: Secondary | ICD-10-CM | POA: Diagnosis not present

## 2019-02-02 DIAGNOSIS — Z8249 Family history of ischemic heart disease and other diseases of the circulatory system: Secondary | ICD-10-CM | POA: Diagnosis not present

## 2019-02-02 DIAGNOSIS — Z87891 Personal history of nicotine dependence: Secondary | ICD-10-CM | POA: Diagnosis not present

## 2019-02-02 DIAGNOSIS — K2980 Duodenitis without bleeding: Secondary | ICD-10-CM | POA: Insufficient documentation

## 2019-02-02 LAB — CBC WITH DIFFERENTIAL/PLATELET
Abs Immature Granulocytes: 0 10*3/uL (ref 0.00–0.07)
Basophils Absolute: 0.1 10*3/uL (ref 0.0–0.1)
Basophils Relative: 2 %
Eosinophils Absolute: 0.2 10*3/uL (ref 0.0–0.5)
Eosinophils Relative: 5 %
HCT: 42.8 % (ref 39.0–52.0)
Hemoglobin: 13.2 g/dL (ref 13.0–17.0)
Immature Granulocytes: 0 %
Lymphocytes Relative: 35 %
Lymphs Abs: 1.6 10*3/uL (ref 0.7–4.0)
MCH: 27.4 pg (ref 26.0–34.0)
MCHC: 30.8 g/dL (ref 30.0–36.0)
MCV: 88.8 fL (ref 80.0–100.0)
Monocytes Absolute: 0.5 10*3/uL (ref 0.1–1.0)
Monocytes Relative: 11 %
Neutro Abs: 2.1 10*3/uL (ref 1.7–7.7)
Neutrophils Relative %: 47 %
Platelets: 188 10*3/uL (ref 150–400)
RBC: 4.82 MIL/uL (ref 4.22–5.81)
RDW: 18.6 % — ABNORMAL HIGH (ref 11.5–15.5)
WBC: 4.4 10*3/uL (ref 4.0–10.5)
nRBC: 0 % (ref 0.0–0.2)

## 2019-02-02 LAB — FERRITIN: Ferritin: 143 ng/mL (ref 24–336)

## 2019-02-02 LAB — IRON AND TIBC
Iron: 78 ug/dL (ref 42–163)
Saturation Ratios: 25 % (ref 20–55)
TIBC: 308 ug/dL (ref 202–409)
UIBC: 229 ug/dL (ref 117–376)

## 2019-02-02 NOTE — Progress Notes (Signed)
HEMATOLOGY/ONCOLOGY CLINIC NOTE  Date of Service: .03/21/2016   Patient Care Team: [name], MD as PCP - General (Internal Medicine) Lorretta Harp, MD as PCP - Cardiology (Cardiology) Lorretta Harp, MD as Consulting Physician (Cardiology)   Endocrinologist: Dr. Jacelyn Pi  CHIEF COMPLAINTS/PURPOSE OF CONSULTATION:  F/u for IDA  DIAGNOSIS  Microcytic hypochromic Anemia with MCV 79.4 due to Iron deficiency. Ferritin 27, Iron sat too low to calculate. No overt evidence of bleeding. Patient notes recent fecal occult blood neg x 1. He has had significant iron deficiency anemia in the past in 2011 with a ferritin level of 9 and required a blood transfusion. EGD showed duodenitis and hiatal hernia. Patient remains at high risk of GI bleeding since he is on ASA+ plavix for his endovascular stent graft for repaired AAA and is on Cilostazole for PAD. Some element of anemia also appears to be temporally related to methimazole use (though agranulocytosis is the usual concern and isolated anemia is less usual) The hyperthyroidism itself can also contribute to the anemia until corrected. SPEP with no M spike No evidence of hemolysis B12/folate WNL  Current treatment -Iron polysaccharide 150mg  po daily for iron maintenance replacement -previously IV feraheme. Continue prn IV feraheme.  HISTORY OF PRESENTING ILLNESS: plz see initial consultation for details on initial presentation   INTERVAL HISTORY:  Shawn Fernandez is a 73 y.o. male here for evaluation and management of anemia. The patient's last visit with [place] was on 11/26/2018. The pt reports that he is doing well overall.  The pt reports that he has been feeling better the last 2 months and has had more energy since his IV Iron infusion. Pt is scheduled to get a rpt Colonoscopy and Endoscopy on 12/09. His thyroid has been stable and he has not had any concerns in the interim. Pt was given a 90 day supply of PO 150  mg Iron Polysaccharide by Dr. Baird Cancer. He takes it every morning and has had no issues tolerating the preparation. His bowel movents are currently brown in color and he denies any bloody stools.  Lab results today (02/02/19) of CBC w/diff is as follows: all values are WNL except for RDW at 18.6.  02/02/2019 Ferritin at 143 02/02/2019 Iron and TIBC is as follows: all values are WNL   On review of systems, pt reports increased energy and denies fatigue, back pain, abdominal pain, problems urinating, bloody/black stools and any other symptoms.   MEDICAL HISTORY:   Past Medical History:  Diagnosis Date  . Blood clot in abdominal vein    treated at [facility]  . Carotid artery disease ([place])   . Dyslipidemia   . FHx: heart disease   . History of blood transfusion 8-9 yrs ago  . HTN (hypertension)   . Peripheral vascular disease ([place])    Hyperthyroidism -being followed by Dr. Chalmers Cater - was previously on methimazole for 6-8 months and has been off for a few weeks now. He was treated with radioactive iodine about one week ago.  He had presented with progressive weight loss from 2 years prior to diagnosis.  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  Previous history of iron deficiency anemia ferritin was 9 about 5 years ago in 2011  SURGICAL HISTORY: Past Surgical History:  Procedure Laterality Date  . ANKLE SURGERY Right yrs ago  . arm surgery Left yrs ago   2 rods inserted, 1 rod later removed  .  COLONOSCOPY WITH PROPOFOL N/A 09/16/2013   Procedure: COLONOSCOPY WITH PROPOFOL;  Surgeon: Beryle Beams, MD;  Location: [facility] ENDOSCOPY;  Service: Endoscopy;  Laterality: N/A;  . LOWER EXTREMITY ANGIOGRAM N/A 07/24/2011   Procedure: LOWER EXTREMITY ANGIOGRAM;  Surgeon: Lorretta Harp, MD;  Location: [facility] CATH LAB;  Service: Cardiovascular;  Laterality: N/A;  . stent in abdominal vein  4-5 yrs ago  . US ECHOCARDIOGRAPHY  07-20-2007   EF 55-60%     SOCIAL HISTORY: Social History   Socioeconomic History  . Marital status: Married    Spouse name: Not on file  . Number of children: Not on file  . Years of education: Not on file  . Highest education level: Not on file  Occupational History  . Occupation: retired  Scientific laboratory technician  . Financial resource strain: Not hard at all  . Food insecurity    Worry: Never true    Inability: Never true  . Transportation needs    Medical: No    Non-medical: No  Tobacco Use  . Smoking status: Former Smoker    Packs/day: 0.75    Years: 20.00    Pack years: 15.00    Types: Cigarettes    Quit date: 07/01/2009    Years since quitting: 9.5  . Smokeless tobacco: Never Used  Substance and Sexual Activity  . Alcohol use: Yes    Comment: only occ  . Drug use: No  . Sexual activity: Yes  Lifestyle  . Physical activity    Days per week: 7 days    Minutes per session: 30 min  . Stress: Not at all  Relationships  . Social Herbalist on phone: Not on file    Gets together: Not on file    Attends religious service: Not on file    Active member of club or organization: Not on file    Attends meetings of clubs or organizations: Not on file    Relationship status: Not on file  . Intimate partner violence    Fear of current or ex partner: No    Emotionally abused: No    Physically abused: No    Forced sexual activity: No  Other Topics Concern  . Not on file  Social History Narrative  . Not on file    FAMILY HISTORY: Family History  Problem Relation Age of Onset  . Heart disease Brother   . Heart disease Mother   . Cancer Father        ? type    ALLERGIES:  is allergic to lisinopril.  MEDICATIONS:  Current Outpatient Medications  Medication Sig Dispense Refill  . amLODipine (NORVASC) 10 MG tablet Take 1 tablet (10 mg total) by mouth every morning. 90 tablet 2  . aspirin EC 81 MG tablet Take 81 mg by mouth daily.    . Cholecalciferol (VITAMIN D3) 1000 units CAPS Take 1  capsule by mouth daily.    . cilostazol (PLETAL) 50 MG tablet Take 50 mg by mouth every morning.    . clopidogrel (PLAVIX) 75 MG tablet TAKE 1 TABLET BY MOUTH EVERY DAY 90 tablet 1  . iron polysaccharides (NIFEREX) 150 MG capsule Take 1 capsule (150 mg total) by mouth daily. 30 capsule 5  . levothyroxine (SYNTHROID) 125 MCG tablet Take 1 tablet (125 mcg total) by mouth daily. 30 tablet 11  . losartan (COZAAR) 100 MG tablet TAKE 1 TABLET BY MOUTH EVERY DAY 90 tablet 0  . Multiple Vitamin (MULTIVITAMIN WITH MINERALS)  TABS tablet Take 1 tablet by mouth daily.    . pantoprazole (PROTONIX) 40 MG tablet TAKE 1 TABLET BY MOUTH EVERY DAY 90 tablet 2  . simvastatin (ZOCOR) 20 MG tablet TAKE 1 TABLET BY ORAL ROUTE EVERY DAY IN THE EVENING 90 tablet 1   No current facility-administered medications for this visit.     REVIEW OF SYSTEMS:   A 10+ POINT REVIEW OF SYSTEMS WAS OBTAINED including neurology, dermatology, psychiatry, cardiac, respiratory, lymph, extremities, GI, GU, Musculoskeletal, constitutional, breasts, reproductive, HEENT.  All pertinent positives are noted in the HPI.  All others are negative.   PHYSICAL EXAMINATION: ECOG FS:2 - Symptomatic, <50% confined to bed  Vitals:   02/02/19 1515  BP: (!) 179/71  Pulse: (!) 48  Resp: 18  Temp: 98.7 F (37.1 C)  SpO2: 100%   Wt Readings from Last 3 Encounters:  02/02/19 146 lb 11.2 oz (66.5 kg)  01/04/19 147 lb 6.4 oz (66.9 kg)  11/26/18 145 lb 12.8 oz (66.1 kg)   Body mass index is 22.44 kg/m.    GENERAL:alert, in no acute distress and comfortable SKIN: no acute rashes, no significant lesions EYES: conjunctiva are pink and non-injected, sclera anicteric OROPHARYNX: MMM, no exudates, no oropharyngeal erythema or ulceration NECK: supple, no JVD LYMPH:  no palpable lymphadenopathy in the cervical, axillary or inguinal regions LUNGS: clear to auscultation b/l with normal respiratory effort HEART: regular rate & rhythm ABDOMEN:   normoactive bowel sounds , non tender, not distended. No palpable hepatosplenomegaly.  Extremity: no pedal edema PSYCH: alert & oriented x 3 with fluent speech NEURO: no focal motor/sensory deficits  LABORATORY DATA:  I have reviewed the data as listed  . CBC Latest Ref Rng & Units 02/02/2019 11/26/2018 11/03/2018  WBC 4.0 - 10.5 K/uL 4.4 7.4 5.5  Hemoglobin 13.0 - 17.0 g/dL 13.2 9.4(L) 7.7(L)  Hematocrit 39.0 - 52.0 % 42.8 32.8(L) 26.7(L)  Platelets 150 - 400 K/uL 188 323 258   . CBC    Component Value Date/Time   WBC 4.4 02/02/2019 1324   RBC 4.82 02/02/2019 1324   HGB 13.2 02/02/2019 1324   HGB 9.4 (L) 11/26/2018 0908   HGB 7.7 (L) 11/03/2018 1626   HGB 13.9 03/21/2016 0919   HCT 42.8 02/02/2019 1324   HCT 26.7 (L) 11/03/2018 1626   HCT 43.1 03/21/2016 0919   PLT 188 02/02/2019 1324   PLT 323 11/26/2018 0908   PLT 258 11/03/2018 1626   MCV 88.8 02/02/2019 1324   MCV 75 (L) 11/03/2018 1626   MCV 89.2 03/21/2016 0919   MCH 27.4 02/02/2019 1324   MCHC 30.8 02/02/2019 1324   RDW 18.6 (H) 02/02/2019 1324   RDW 15.2 11/03/2018 1626   RDW 13.1 03/21/2016 0919   LYMPHSABS 1.6 02/02/2019 1324   LYMPHSABS 2.1 11/03/2018 1626   LYMPHSABS 2.5 03/21/2016 0919   MONOABS 0.5 02/02/2019 1324   MONOABS 0.8 03/21/2016 0919   EOSABS 0.2 02/02/2019 1324   EOSABS 0.2 11/03/2018 1626   BASOSABS 0.1 02/02/2019 1324   BASOSABS 0.1 11/03/2018 1626   BASOSABS 0.1 03/21/2016 0919    CMP Latest Ref Rng & Units 11/26/2018 06/17/2018 11/04/2017  Glucose 70 - 99 mg/dL 87 88 -  BUN 8 - 23 mg/dL 15 9 8   Creatinine 0.61 - 1.24 mg/dL 0.98 0.94 1.1  Sodium 135 - 145 mmol/L 143 147(H) 145  Potassium 3.5 - 5.1 mmol/L 3.8 4.0 4.4  Chloride 98 - 111 mmol/L 107 107(H) -  CO2 22 -  32 mmol/L 26 20 -  Calcium 8.9 - 10.3 mg/dL 9.7 9.4 -  Total Protein 6.5 - 8.1 g/dL 7.7 7.4 -  Total Bilirubin 0.3 - 1.2 mg/dL 0.4 0.7 -  Alkaline Phos 38 - 126 U/L 117 109 99  AST 15 - 41 U/L 20 22 16   ALT 0 - 44 U/L <6 6  5(A)   . Lab Results  Component Value Date   IRON 78 02/02/2019   TIBC 308 02/02/2019   IRONPCTSAT 25 02/02/2019   (Iron and TIBC)  Lab Results  Component Value Date   FERRITIN 143 02/02/2019   . Lab Results  Component Value Date   IRON 78 02/02/2019   TIBC 308 02/02/2019   IRONPCTSAT 25 02/02/2019   (Iron and TIBC)  Lab Results  Component Value Date   FERRITIN 143 02/02/2019    RADIOGRAPHIC STUDIES: I have personally reviewed the radiological images as listed and agreed with the findings in the report. No results found.  ASSESSMENT & PLAN:   73 yo AAM with   1) Microcytic hypochromic Anemia due to Iron deficiency - now resolved.  Previously had Ferritin 27, Iron sat too low to calculate. No overt evidence of bleeding. Patient notes recent fecal occult blood neg x 1. He has had significant iron deficiency anemia in the past in 2011 with a ferritin level of 9 and required a blood transfusion. EGD showed duodenitis and hiatal hernia. Patient remains at high risk of GI bleeding since he is on ASA+ plavix for his endovascular stent graft for repaired AAA and is on Cilostazole for PAD. Some element of anemia also appears to be temporally related to methimazole use (though agranulocytosis is the usual concern and isolated anemia is less usual) The hyperthyroidism itself can also contribute to the anemia until corrected. SPEP with no M spike No evidence of hemolysis B12/folate WNL   PLAN: -Discussed pt labwork today, 02/02/19; Hgb has improved, all values are WNL except for RDW at 18.6.  -Discussed 02/02/2019 Ferritin at 143 -Discussed 02/02/2019 Iron and TIBC is as follows: all values are WNL  -Advised that Asprin, Plavix & Cilostazol increase bleeding risks and may worsen anemia  -Pt is currently taking 150 mg PO Iron Polysaccharide once a day  -Advised pt that PO iron can cause a darkening of stools and will be hard to determine if he is bleeding from his GI tract  -Will recheck Iron and Ferritin with next labs to see if counts are holding -Advised pt that we will most likely need to repeat his IV Iron infusion every 4-6 months if his bleeding is not stopped  -Pt has a rpt Endoscopy and Colonoscopy scheduled for 02/09/19 -Recommended pt to f/u with Dr. Baird Cancer to discuss current medications -Will see back in 4 months with labs    FOLLOW UP: RTC with Dr Irene Limbo with labs in 4 months  The total time spent in the appt was 15 minutes and more than 50% was on counseling and direct patient cares.  All of the patient's questions were answered with apparent satisfaction. The patient knows to call the clinic with any problems, questions or concerns.   Sullivan Lone MD [place] AAHIVMS [facility] Hematology/Oncology Physician [facility]  (Office):       [phone] (Work cell):  [phone] (Fax):           [phone]  I, Yevette Edwards, am acting as a scribe for Dr. Sullivan Lone.   .I have reviewed the  above documentation for accuracy and completeness, and I agree with the above. Brunetta Genera MD

## 2019-02-03 ENCOUNTER — Telehealth: Payer: Self-pay | Admitting: Hematology

## 2019-02-03 NOTE — Telephone Encounter (Signed)
Scheduled appt per 12/2 los.  Left a VM of the appt date and time.

## 2019-02-09 LAB — HM COLONOSCOPY

## 2019-02-17 ENCOUNTER — Other Ambulatory Visit: Payer: Self-pay | Admitting: Internal Medicine

## 2019-03-01 ENCOUNTER — Encounter: Payer: Self-pay | Admitting: Internal Medicine

## 2019-03-02 ENCOUNTER — Encounter: Payer: Self-pay | Admitting: Internal Medicine

## 2019-03-15 ENCOUNTER — Telehealth: Payer: Self-pay

## 2019-03-15 NOTE — Telephone Encounter (Signed)
The pt was notified that samples of synthroid is ready for pickup.

## 2019-03-29 ENCOUNTER — Other Ambulatory Visit: Payer: Self-pay | Admitting: Internal Medicine

## 2019-05-10 ENCOUNTER — Other Ambulatory Visit: Payer: Self-pay | Admitting: Internal Medicine

## 2019-05-23 ENCOUNTER — Telehealth: Payer: Self-pay | Admitting: Hematology

## 2019-05-23 NOTE — Telephone Encounter (Signed)
R/s per MD. Hulen Skains and left a msg. Mailing printout

## 2019-06-03 ENCOUNTER — Ambulatory Visit: Payer: Medicare Other | Admitting: Hematology

## 2019-06-03 ENCOUNTER — Other Ambulatory Visit: Payer: Medicare Other

## 2019-06-07 ENCOUNTER — Other Ambulatory Visit: Payer: Self-pay | Admitting: Internal Medicine

## 2019-06-24 ENCOUNTER — Inpatient Hospital Stay: Payer: Medicare Other | Attending: Hematology

## 2019-06-24 ENCOUNTER — Inpatient Hospital Stay: Payer: Medicare Other | Admitting: Hematology

## 2019-07-04 ENCOUNTER — Ambulatory Visit: Payer: Self-pay | Admitting: Internal Medicine

## 2019-07-13 ENCOUNTER — Encounter: Payer: Self-pay | Admitting: Internal Medicine

## 2019-07-13 ENCOUNTER — Ambulatory Visit (INDEPENDENT_AMBULATORY_CARE_PROVIDER_SITE_OTHER): Payer: Medicare Other | Admitting: Internal Medicine

## 2019-07-13 ENCOUNTER — Other Ambulatory Visit: Payer: Self-pay

## 2019-07-13 VITALS — BP 132/90 | HR 50 | Temp 98.3°F | Ht 67.8 in | Wt 147.4 lb

## 2019-07-13 DIAGNOSIS — I739 Peripheral vascular disease, unspecified: Secondary | ICD-10-CM

## 2019-07-13 DIAGNOSIS — E039 Hypothyroidism, unspecified: Secondary | ICD-10-CM

## 2019-07-13 DIAGNOSIS — I10 Essential (primary) hypertension: Secondary | ICD-10-CM | POA: Diagnosis not present

## 2019-07-13 NOTE — Progress Notes (Signed)
This visit occurred during the SARS-CoV-2 public health emergency.  Safety protocols were in place, including screening questions prior to the visit, additional usage of staff PPE, and extensive cleaning of exam room while observing appropriate contact time as indicated for disinfecting solutions.  Subjective:     Patient ID: Shawn Fernandez , male    DOB: 05/19/1945 , 74 y.o.   MRN: [id number]   Chief Complaint  Patient presents with  . Hypertension    HPI  He presents today for BP check. He reports compliance with meds. He denies chest pain, shortness of breath, palpitations and visual disturbances.   Hypertension This is a chronic problem. The current episode started more than 1 year ago. The problem has been gradually improving since onset. The problem is controlled. Pertinent negatives include no blurred vision, chest pain, headaches, palpitations or shortness of breath. Risk factors for coronary artery disease include male gender and dyslipidemia.     Past Medical History:  Diagnosis Date  . Blood clot in abdominal vein    treated at [facility]  . Carotid artery disease ([place])   . Dyslipidemia   . FHx: heart disease   . History of blood transfusion 8-9 yrs ago  . HTN (hypertension)   . Peripheral vascular disease ([place])      Family History  Problem Relation Age of Onset  . Heart disease Brother   . Heart disease Mother   . Cancer Father        ? type     Current Outpatient Medications:  .  amLODipine (NORVASC) 10 MG tablet, TAKE 1 TABLET BY MOUTH EVERY DAY IN THE MORNING, Disp: 90 tablet, Rfl: 1 .  aspirin EC 81 MG tablet, Take 81 mg by mouth daily., Disp: , Rfl:  .  Cholecalciferol (VITAMIN D3) 1000 units CAPS, Take 1 capsule by mouth daily., Disp: , Rfl:  .  cilostazol (PLETAL) 50 MG tablet, Take 50 mg by mouth every morning., Disp: , Rfl:  .  clopidogrel (PLAVIX) 75 MG tablet, TAKE 1 TABLET BY MOUTH EVERY DAY, Disp: 90 tablet, Rfl: 1 .  iron polysaccharides  (NIFEREX) 150 MG capsule, Take 1 capsule (150 mg total) by mouth daily., Disp: 30 capsule, Rfl: 5 .  Iron-FA-B Cmp-C-Biot-Probiotic (FUSION PLUS) CAPS, TAKE 1 CAPSULE BY MOUTH EVERY DAY, Disp: 30 capsule, Rfl: 1 .  levothyroxine (SYNTHROID) 125 MCG tablet, Take 1 tablet (125 mcg total) by mouth daily., Disp: 30 tablet, Rfl: 11 .  losartan (COZAAR) 100 MG tablet, TAKE 1 TABLET BY MOUTH EVERY DAY, Disp: 90 tablet, Rfl: 0 .  Multiple Vitamin (MULTIVITAMIN WITH MINERALS) TABS tablet, Take 1 tablet by mouth daily., Disp: , Rfl:  .  pantoprazole (PROTONIX) 40 MG tablet, TAKE 1 TABLET BY MOUTH EVERY DAY, Disp: 90 tablet, Rfl: 2 .  simvastatin (ZOCOR) 20 MG tablet, TAKE 1 TABLET BY MOUTH EVERY DAY IN THE EVENING, Disp: 90 tablet, Rfl: 1   Allergies  Allergen Reactions  . Lisinopril Cough     Review of Systems  Constitutional: Negative.   Eyes: Negative for blurred vision.  Respiratory: Negative.  Negative for shortness of breath.   Cardiovascular: Negative.  Negative for chest pain and palpitations.  Gastrointestinal: Negative.   Neurological: Negative.  Negative for headaches.  Psychiatric/Behavioral: Negative.      Today's Vitals   07/13/19 1412  BP: 132/90  Pulse: (!) 50  Temp: 98.3 F (36.8 C)  TempSrc: Oral  Weight: 147 lb 6.4 oz (66.9 kg)  Height:  5' 7.8" (1.722 m)  PainSc: 0-No pain   Body mass index is 22.54 kg/m.   Wt Readings from Last 3 Encounters:  07/13/19 147 lb 6.4 oz (66.9 kg)  02/02/19 146 lb 11.2 oz (66.5 kg)  01/04/19 147 lb 6.4 oz (66.9 kg)    Objective:  Physical Exam Vitals and nursing note reviewed.  Constitutional:      Appearance: Normal appearance.  Cardiovascular:     Rate and Rhythm: Normal rate and regular rhythm.     Heart sounds: Normal heart sounds.  Pulmonary:     Effort: Pulmonary effort is normal.     Breath sounds: Normal breath sounds.  Skin:    General: Skin is warm.  Neurological:     General: No focal deficit present.      Mental Status: He is alert.  Psychiatric:        Mood and Affect: Mood normal.         Assessment And Plan:     1. Essential hypertension, benign  Chronic, fair control. He will continue with current meds for now. Advised BP goal is less than 130/80. He is encouraged to avoid adding salt to his foods and to increase his daily activity . I will check renal function today. He will rto in six months for his next physical examination.   - CMP14+EGFR  2. Peripheral arterial disease (HCC)  Chronic, yet stable. He reports compliance with meds. Importance of statin compliance and heart healthy diet was discussed with the patient.   3. Acquired hypothyroidism  I will check thyroid panel and adjust meds as needed.  - TSH - T4, Free   [name], MD    THE PATIENT IS ENCOURAGED TO PRACTICE SOCIAL DISTANCING DUE TO THE COVID-19 PANDEMIC.

## 2019-07-13 NOTE — Patient Instructions (Signed)
Hypothyroidism  Hypothyroidism is when the thyroid gland does not make enough of certain hormones (it is underactive). The thyroid gland is a small gland located in the lower front part of the neck, just in front of the windpipe (trachea). This gland makes hormones that help control how the body uses food for energy (metabolism) as well as how the heart and brain function. These hormones also play a role in keeping your bones strong. When the thyroid is underactive, it produces too little of the hormones thyroxine (T4) and triiodothyronine (T3). What are the causes? This condition may be caused by:  Hashimoto's disease. This is a disease in which the body's disease-fighting system (immune system) attacks the thyroid gland. This is the most common cause.  Viral infections.  Pregnancy.  Certain medicines.  Birth defects.  Past radiation treatments to the head or neck for cancer.  Past treatment with radioactive iodine.  Past exposure to radiation in the environment.  Past surgical removal of part or all of the thyroid.  Problems with a gland in the center of the brain (pituitary gland).  Lack of enough iodine in the diet. What increases the risk? You are more likely to develop this condition if:  You are male.  You have a family history of thyroid conditions.  You use a medicine called lithium.  You take medicines that affect the immune system (immunosuppressants). What are the signs or symptoms? Symptoms of this condition include:  Feeling as though you have no energy (lethargy).  Not being able to tolerate cold.  Weight gain that is not explained by a change in diet or exercise habits.  Lack of appetite.  Dry skin.  Coarse hair.  Menstrual irregularity.  Slowing of thought processes.  Constipation.  Sadness or depression. How is this diagnosed? This condition may be diagnosed based on:  Your symptoms, your medical history, and a physical exam.  Blood  tests. You may also have imaging tests, such as an ultrasound or MRI. How is this treated? This condition is treated with medicine that replaces the thyroid hormones that your body does not make. After you begin treatment, it may take several weeks for symptoms to go away. Follow these instructions at home:  Take over-the-counter and prescription medicines only as told by your health care provider.  If you start taking any new medicines, tell your health care provider.  Keep all follow-up visits as told by your health care provider. This is important. ? As your condition improves, your dosage of thyroid hormone medicine may change. ? You will need to have blood tests regularly so that your health care provider can monitor your condition. Contact a health care provider if:  Your symptoms do not get better with treatment.  You are taking thyroid replacement medicine and you: ? Sweat a lot. ? Have tremors. ? Feel anxious. ? Lose weight rapidly. ? Cannot tolerate heat. ? Have emotional swings. ? Have diarrhea. ? Feel weak. Get help right away if you have:  Chest pain.  An irregular heartbeat.  A rapid heartbeat.  Difficulty breathing. Summary  Hypothyroidism is when the thyroid gland does not make enough of certain hormones (it is underactive).  When the thyroid is underactive, it produces too little of the hormones thyroxine (T4) and triiodothyronine (T3).  The most common cause is Hashimoto's disease, a disease in which the body's disease-fighting system (immune system) attacks the thyroid gland. The condition can also be caused by viral infections, medicine, pregnancy, or past   radiation treatment to the head or neck.  Symptoms may include weight gain, dry skin, constipation, feeling as though you do not have energy, and not being able to tolerate cold.  This condition is treated with medicine to replace the thyroid hormones that your body does not make. This information  is not intended to replace advice given to you by your health care provider. Make sure you discuss any questions you have with your health care provider. Document Revised: 01/30/2017 Document Reviewed: 01/28/2017 Elsevier Patient Education  2020 [employer]  

## 2019-07-14 ENCOUNTER — Telehealth: Payer: Self-pay | Admitting: Hematology

## 2019-07-14 LAB — CMP14+EGFR
ALT: 12 IU/L (ref 0–44)
AST: 35 IU/L (ref 0–40)
Albumin/Globulin Ratio: 1.5 (ref 1.2–2.2)
Albumin: 4.8 g/dL — ABNORMAL HIGH (ref 3.7–4.7)
Alkaline Phosphatase: 138 IU/L — ABNORMAL HIGH (ref 39–117)
BUN/Creatinine Ratio: 11 (ref 10–24)
BUN: 10 mg/dL (ref 8–27)
Bilirubin Total: 0.5 mg/dL (ref 0.0–1.2)
CO2: 24 mmol/L (ref 20–29)
Calcium: 9.6 mg/dL (ref 8.6–10.2)
Chloride: 103 mmol/L (ref 96–106)
Creatinine, Ser: 0.91 mg/dL (ref 0.76–1.27)
GFR calc Af Amer: 96 mL/min/{1.73_m2} (ref >59)
GFR calc non Af Amer: 83 mL/min/{1.73_m2} (ref >59)
Globulin, Total: 3.2 g/dL (ref 1.5–4.5)
Glucose: 88 mg/dL (ref 65–99)
Potassium: 4 mmol/L (ref 3.5–5.2)
Sodium: 143 mmol/L (ref 134–144)
Total Protein: 8 g/dL (ref 6.0–8.5)

## 2019-07-14 LAB — T4, FREE: Free T4: 0.88 ng/dL (ref 0.82–1.77)

## 2019-07-14 LAB — TSH: TSH: 27.3 u[IU]/mL — ABNORMAL HIGH (ref 0.450–4.500)

## 2019-07-14 NOTE — Telephone Encounter (Signed)
R/s appt per 5/13 schmessage - unable to reach pt - left message with appt date and time

## 2019-07-19 ENCOUNTER — Telehealth: Payer: Self-pay

## 2019-07-19 ENCOUNTER — Other Ambulatory Visit: Payer: Self-pay

## 2019-07-19 NOTE — Telephone Encounter (Signed)
-----   Message from [name], MD sent at 07/17/2019  8:05 PM EDT ----- Have you been taking meds daily? Have you missed any doses? Pls confirm how he is taking meds.  You liver and kidney function are stable.

## 2019-07-20 ENCOUNTER — Other Ambulatory Visit: Payer: Self-pay

## 2019-07-20 ENCOUNTER — Other Ambulatory Visit: Payer: Self-pay | Admitting: Internal Medicine

## 2019-08-09 ENCOUNTER — Inpatient Hospital Stay: Payer: Medicare Other | Attending: Hematology

## 2019-08-09 ENCOUNTER — Inpatient Hospital Stay (HOSPITAL_BASED_OUTPATIENT_CLINIC_OR_DEPARTMENT_OTHER): Payer: Medicare Other | Admitting: Hematology

## 2019-08-09 ENCOUNTER — Other Ambulatory Visit: Payer: Self-pay

## 2019-08-09 VITALS — BP 159/75 | HR 51 | Temp 97.7°F | Resp 14 | Wt 146.9 lb

## 2019-08-09 DIAGNOSIS — E0590 Thyrotoxicosis, unspecified without thyrotoxic crisis or storm: Secondary | ICD-10-CM | POA: Diagnosis not present

## 2019-08-09 DIAGNOSIS — Z79899 Other long term (current) drug therapy: Secondary | ICD-10-CM | POA: Diagnosis not present

## 2019-08-09 DIAGNOSIS — D500 Iron deficiency anemia secondary to blood loss (chronic): Secondary | ICD-10-CM

## 2019-08-09 DIAGNOSIS — E785 Hyperlipidemia, unspecified: Secondary | ICD-10-CM | POA: Insufficient documentation

## 2019-08-09 DIAGNOSIS — Z87891 Personal history of nicotine dependence: Secondary | ICD-10-CM | POA: Insufficient documentation

## 2019-08-09 DIAGNOSIS — I714 Abdominal aortic aneurysm, without rupture: Secondary | ICD-10-CM | POA: Diagnosis not present

## 2019-08-09 DIAGNOSIS — K449 Diaphragmatic hernia without obstruction or gangrene: Secondary | ICD-10-CM | POA: Insufficient documentation

## 2019-08-09 DIAGNOSIS — D509 Iron deficiency anemia, unspecified: Secondary | ICD-10-CM | POA: Insufficient documentation

## 2019-08-09 DIAGNOSIS — I10 Essential (primary) hypertension: Secondary | ICD-10-CM | POA: Diagnosis not present

## 2019-08-09 DIAGNOSIS — I739 Peripheral vascular disease, unspecified: Secondary | ICD-10-CM | POA: Insufficient documentation

## 2019-08-09 LAB — CBC WITH DIFFERENTIAL/PLATELET
Abs Immature Granulocytes: 0 K/uL (ref 0.00–0.07)
Basophils Absolute: 0.1 K/uL (ref 0.0–0.1)
Basophils Relative: 1 %
Eosinophils Absolute: 0.1 K/uL (ref 0.0–0.5)
Eosinophils Relative: 2 %
HCT: 39.9 % (ref 39.0–52.0)
Hemoglobin: 12.3 g/dL — ABNORMAL LOW (ref 13.0–17.0)
Immature Granulocytes: 0 %
Lymphocytes Relative: 27 %
Lymphs Abs: 1.5 K/uL (ref 0.7–4.0)
MCH: 29.3 pg (ref 26.0–34.0)
MCHC: 30.8 g/dL (ref 30.0–36.0)
MCV: 95 fL (ref 80.0–100.0)
Monocytes Absolute: 0.5 K/uL (ref 0.1–1.0)
Monocytes Relative: 8 %
Neutro Abs: 3.5 K/uL (ref 1.7–7.7)
Neutrophils Relative %: 62 %
Platelets: 177 K/uL (ref 150–400)
RBC: 4.2 MIL/uL — ABNORMAL LOW (ref 4.22–5.81)
RDW: 13.2 % (ref 11.5–15.5)
WBC: 5.7 K/uL (ref 4.0–10.5)
nRBC: 0 % (ref 0.0–0.2)

## 2019-08-09 LAB — CMP (CANCER CENTER ONLY)
ALT: 8 U/L (ref 0–44)
AST: 22 U/L (ref 15–41)
Albumin: 4.2 g/dL (ref 3.5–5.0)
Alkaline Phosphatase: 122 U/L (ref 38–126)
Anion gap: 10 (ref 5–15)
BUN: 12 mg/dL (ref 8–23)
CO2: 26 mmol/L (ref 22–32)
Calcium: 9.8 mg/dL (ref 8.9–10.3)
Chloride: 109 mmol/L (ref 98–111)
Creatinine: 0.97 mg/dL (ref 0.61–1.24)
GFR, Est AFR Am: >60 mL/min
GFR, Estimated: >60 mL/min
Glucose, Bld: 87 mg/dL (ref 70–99)
Potassium: 4 mmol/L (ref 3.5–5.1)
Sodium: 145 mmol/L (ref 135–145)
Total Bilirubin: 0.6 mg/dL (ref 0.3–1.2)
Total Protein: 8.2 g/dL — ABNORMAL HIGH (ref 6.5–8.1)

## 2019-08-09 LAB — IRON AND TIBC
Iron: 40 ug/dL — ABNORMAL LOW (ref 42–163)
Saturation Ratios: 10 % — ABNORMAL LOW (ref 20–55)
TIBC: 394 ug/dL (ref 202–409)
UIBC: 354 ug/dL (ref 117–376)

## 2019-08-09 LAB — FERRITIN: Ferritin: 50 ng/mL (ref 24–336)

## 2019-08-09 NOTE — Progress Notes (Signed)
HEMATOLOGY/ONCOLOGY CLINIC NOTE  Date of Service: .03/21/2016   Patient Care Team: [name], MD as PCP - General (Internal Medicine) Lorretta Harp, MD as PCP - Cardiology (Cardiology) Lorretta Harp, MD as Consulting Physician (Cardiology)   Endocrinologist: Dr. Jacelyn Pi  CHIEF COMPLAINTS/PURPOSE OF CONSULTATION:  F/u for IDA  DIAGNOSIS  Microcytic hypochromic Anemia with MCV 79.4 due to Iron deficiency. Ferritin 27, Iron sat too low to calculate. No overt evidence of bleeding. Patient notes recent fecal occult blood neg x 1. He has had significant iron deficiency anemia in the past in 2011 with a ferritin level of 9 and required a blood transfusion. EGD showed duodenitis and hiatal hernia. Patient remains at high risk of GI bleeding since he is on ASA+ plavix for his endovascular stent graft for repaired AAA and is on Cilostazole for PAD. Some element of anemia also appears to be temporally related to methimazole use (though agranulocytosis is the usual concern and isolated anemia is less usual) The hyperthyroidism itself can also contribute to the anemia until corrected. SPEP with no M spike No evidence of hemolysis B12/folate WNL  Current treatment -Iron polysaccharide 150mg  po daily for iron maintenance replacement -previously IV feraheme. Continue prn IV feraheme.  HISTORY OF PRESENTING ILLNESS: plz see initial consultation for details on initial presentation   INTERVAL HISTORY: Shawn Fernandez is a 74 y.o. male here for evaluation and management of anemia. The patient's last visit with [place] was on 02/02/2019. The pt reports that he is doing well overall.  The pt reports that he has continued taking PO Iron and has had no issues tolerating. He denies any black/bloody stools or any other noticeable blood loss. Pt has been eating and sleeping well.   Lab results today (08/09/19) of CBC w/diff and CMP is as follows: all values are WNL except for RBC at  4.20, Hgb at 12.3, Total Protein at 8.2. 08/09/2019 Iron Panel is as follows: Iron at 40, TIBC at 394, Sat Ratios at 10, UIBC at 354 08/09/2019 Ferritin at 50  On review of systems, pt denies black/bloody stools, abnormal/excessive bleeding, abdominal pain, unexpected weight loss and any other symptoms.    MEDICAL HISTORY:   Past Medical History:  Diagnosis Date  . Blood clot in abdominal vein    treated at [facility]  . Carotid artery disease ([place])   . Dyslipidemia   . FHx: heart disease   . History of blood transfusion 8-9 yrs ago  . HTN (hypertension)   . Peripheral vascular disease ([place])    Hyperthyroidism -being followed by Dr. Chalmers Cater - was previously on methimazole for 6-8 months and has been off for a few weeks now. He was treated with radioactive iodine about one week ago.  He had presented with progressive weight loss from 2 years prior to diagnosis.  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  Previous history of iron deficiency anemia ferritin was 9 about 5 years ago in 2011  SURGICAL HISTORY: Past Surgical History:  Procedure Laterality Date  . ANKLE SURGERY Right yrs ago  . arm surgery Left yrs ago   2 rods inserted, 1 rod later removed  . COLONOSCOPY WITH PROPOFOL N/A 09/16/2013   Procedure: COLONOSCOPY WITH PROPOFOL;  Surgeon: Beryle Beams, MD;  Location: [facility] ENDOSCOPY;  Service: Endoscopy;  Laterality: N/A;  . LOWER EXTREMITY ANGIOGRAM N/A 07/24/2011   Procedure: LOWER EXTREMITY ANGIOGRAM;  Surgeon: Lorretta Harp, MD;  Location: [facility];  Service: Cardiovascular;  Laterality: N/A;  . stent in abdominal vein  4-5 yrs ago  . US ECHOCARDIOGRAPHY  07-20-2007   EF 55-60%    SOCIAL HISTORY: Social History   Socioeconomic History  . Marital status: Married    Spouse name: Not on file  . Number of children: Not on file  . Years of education: Not on file  . Highest education level: Not on file   Occupational History  . Occupation: retired  Tobacco Use  . Smoking status: Former Smoker    Packs/day: 0.75    Years: 20.00    Pack years: 15.00    Types: Cigarettes    Quit date: 07/01/2009    Years since quitting: 10.1  . Smokeless tobacco: Never Used  Substance and Sexual Activity  . Alcohol use: Yes    Comment: only occ  . Drug use: No  . Sexual activity: Yes  Other Topics Concern  . Not on file  Social History Narrative  . Not on file   Social Determinants of Health   Financial Resource Strain:   . Difficulty of Paying Living Expenses:   Food Insecurity:   . Worried About Charity fundraiser in the Last Year:   . Arboriculturist in the Last Year:   Transportation Needs:   . Film/video editor (Medical):   Marland Kitchen Lack of Transportation (Non-Medical):   Physical Activity: Sufficiently Active  . Days of Exercise per Week: 7 days  . Minutes of Exercise per Session: 30 min  Stress:   . Feeling of Stress :   Social Connections:   . Frequency of Communication with Friends and Family:   . Frequency of Social Gatherings with Friends and Family:   . Attends Religious Services:   . Active Member of Clubs or Organizations:   . Attends Archivist Meetings:   Marland Kitchen Marital Status:   Intimate Partner Violence:   . Fear of Current or Ex-Partner:   . Emotionally Abused:   Marland Kitchen Physically Abused:   . Sexually Abused:     FAMILY HISTORY: Family History  Problem Relation Age of Onset  . Heart disease Brother   . Heart disease Mother   . Cancer Father        ? type    ALLERGIES:  is allergic to lisinopril.  MEDICATIONS:  Current Outpatient Medications  Medication Sig Dispense Refill  . amLODipine (NORVASC) 10 MG tablet TAKE 1 TABLET BY MOUTH EVERY DAY IN THE MORNING 90 tablet 1  . aspirin EC 81 MG tablet Take 81 mg by mouth daily.    . Cholecalciferol (VITAMIN D3) 1000 units CAPS Take 1 capsule by mouth daily.    . cilostazol (PLETAL) 50 MG tablet Take 50 mg by  mouth every morning.    . clopidogrel (PLAVIX) 75 MG tablet TAKE 1 TABLET BY MOUTH EVERY DAY 90 tablet 1  . iron polysaccharides (NIFEREX) 150 MG capsule Take 1 capsule (150 mg total) by mouth daily. 30 capsule 5  . Iron-FA-B Cmp-C-Biot-Probiotic (FUSION PLUS) CAPS TAKE 1 CAPSULE BY MOUTH EVERY DAY 30 capsule 1  . levothyroxine (SYNTHROID) 125 MCG tablet Take 1 tablet (125 mcg total) by mouth daily. (Patient taking differently: Take 125 mcg by mouth daily. Monday - Saturday) 30 tablet 11  . losartan (COZAAR) 100 MG tablet TAKE 1 TABLET BY MOUTH EVERY DAY 90 tablet 1  . Multiple Vitamin (MULTIVITAMIN WITH MINERALS) TABS tablet Take 1 tablet by  mouth daily.    . pantoprazole (PROTONIX) 40 MG tablet TAKE 1 TABLET BY MOUTH EVERY DAY 90 tablet 2  . simvastatin (ZOCOR) 20 MG tablet TAKE 1 TABLET BY MOUTH EVERY DAY IN THE EVENING 90 tablet 1   No current facility-administered medications for this visit.    REVIEW OF SYSTEMS:   A 10+ POINT REVIEW OF SYSTEMS WAS OBTAINED including neurology, dermatology, psychiatry, cardiac, respiratory, lymph, extremities, GI, GU, Musculoskeletal, constitutional, breasts, reproductive, HEENT.  All pertinent positives are noted in the HPI.  All others are negative.   PHYSICAL EXAMINATION: ECOG FS:2 - Symptomatic, <50% confined to bed  Vitals:   08/09/19 1406  BP: (!) 159/75  Pulse: (!) 51  Resp: 14  Temp: 97.7 F (36.5 C)  SpO2: 100%   Wt Readings from Last 3 Encounters:  08/09/19 146 lb 14.4 oz (66.6 kg)  07/13/19 147 lb 6.4 oz (66.9 kg)  02/02/19 146 lb 11.2 oz (66.5 kg)   Body mass index is 22.47 kg/m.    GENERAL:alert, in no acute distress and comfortable SKIN: no acute rashes, no significant lesions EYES: conjunctiva are pink and non-injected, sclera anicteric OROPHARYNX: MMM, no exudates, no oropharyngeal erythema or ulceration NECK: supple, no JVD LYMPH:  no palpable lymphadenopathy in the cervical, axillary or inguinal regions LUNGS: clear to  auscultation b/l with normal respiratory effort HEART: regular rate & rhythm ABDOMEN:  normoactive bowel sounds , non tender, not distended. No palpable hepatosplenomegaly.  Extremity: no pedal edema PSYCH: alert & oriented x 3 with fluent speech NEURO: no focal motor/sensory deficits  LABORATORY DATA:  I have reviewed the data as listed  . CBC Latest Ref Rng & Units 08/09/2019 02/02/2019 11/26/2018  WBC 4.0 - 10.5 K/uL 5.7 4.4 7.4  Hemoglobin 13.0 - 17.0 g/dL 12.3(L) 13.2 9.4(L)  Hematocrit 39.0 - 52.0 % 39.9 42.8 32.8(L)  Platelets 150 - 400 K/uL 177 188 323   . CBC    Component Value Date/Time   WBC 5.7 08/09/2019 1335   RBC 4.20 (L) 08/09/2019 1335   HGB 12.3 (L) 08/09/2019 1335   HGB 9.4 (L) 11/26/2018 0908   HGB 7.7 (L) 11/03/2018 1626   HGB 13.9 03/21/2016 0919   HCT 39.9 08/09/2019 1335   HCT 26.7 (L) 11/03/2018 1626   HCT 43.1 03/21/2016 0919   PLT 177 08/09/2019 1335   PLT 323 11/26/2018 0908   PLT 258 11/03/2018 1626   MCV 95.0 08/09/2019 1335   MCV 75 (L) 11/03/2018 1626   MCV 89.2 03/21/2016 0919   MCH 29.3 08/09/2019 1335   MCHC 30.8 08/09/2019 1335   RDW 13.2 08/09/2019 1335   RDW 15.2 11/03/2018 1626   RDW 13.1 03/21/2016 0919   LYMPHSABS 1.5 08/09/2019 1335   LYMPHSABS 2.1 11/03/2018 1626   LYMPHSABS 2.5 03/21/2016 0919   MONOABS 0.5 08/09/2019 1335   MONOABS 0.8 03/21/2016 0919   EOSABS 0.1 08/09/2019 1335   EOSABS 0.2 11/03/2018 1626   BASOSABS 0.1 08/09/2019 1335   BASOSABS 0.1 11/03/2018 1626   BASOSABS 0.1 03/21/2016 0919    CMP Latest Ref Rng & Units 08/09/2019 07/13/2019 11/26/2018  Glucose 70 - 99 mg/dL 87 88 87  BUN 8 - 23 mg/dL 12 10 15   Creatinine 0.61 - 1.24 mg/dL 0.97 0.91 0.98  Sodium 135 - 145 mmol/L 145 143 143  Potassium 3.5 - 5.1 mmol/L 4.0 4.0 3.8  Chloride 98 - 111 mmol/L 109 103 107  CO2 22 - 32 mmol/L 26 24 26  Calcium 8.9 - 10.3 mg/dL 9.8 9.6 9.7  Total Protein 6.5 - 8.1 g/dL 8.2(H) 8.0 7.7  Total Bilirubin 0.3 - 1.2 mg/dL  0.6 0.5 0.4  Alkaline Phos 38 - 126 U/L 122 138(H) 117  AST 15 - 41 U/L 22 35 20  ALT 0 - 44 U/L 8 12 <6   . Lab Results  Component Value Date   IRON 40 (L) 08/09/2019   TIBC 394 08/09/2019   IRONPCTSAT 10 (L) 08/09/2019   (Iron and TIBC)  Lab Results  Component Value Date   FERRITIN 50 08/09/2019   . Lab Results  Component Value Date   IRON 40 (L) 08/09/2019   TIBC 394 08/09/2019   IRONPCTSAT 10 (L) 08/09/2019   (Iron and TIBC)  Lab Results  Component Value Date   FERRITIN 50 08/09/2019    RADIOGRAPHIC STUDIES: I have personally reviewed the radiological images as listed and agreed with the findings in the report. No results found.  ASSESSMENT & PLAN:   74 yo AAM with   1) Microcytic hypochromic Anemia due to Iron deficiency - now resolved.  Previously had Ferritin 27, Iron sat too low to calculate. No overt evidence of bleeding. Patient notes recent fecal occult blood neg x 1. He has had significant iron deficiency anemia in the past in 2011 with a ferritin level of 9 and required a blood transfusion. EGD showed duodenitis and hiatal hernia. Patient remains at high risk of GI bleeding since he is on ASA+ plavix for his endovascular stent graft for repaired AAA and is on Cilostazole for PAD. Some element of anemia also appears to be temporally related to methimazole use (though agranulocytosis is the usual concern and isolated anemia is less usual) The hyperthyroidism itself can also contribute to the anemia until corrected. SPEP with no M spike No evidence of hemolysis B12/folate WNL   PLAN: -Discussed pt labwork today, 08/09/19; Hgb has remained steady, PLT & WBC are nml, blood chemistries are stable  -Discussed 08/09/2019 Iron Panel is as follows: Iron at 40, TIBC at 394, Sat Ratios at 10, UIBC at 354 -Discussed 08/09/2019 Ferritin at 50 - Would prefer to keep Ferritin >= 100  -Aspirin, Plavix, & Cilostazol increase bleeding risks and may contribute to  blood loss -Recommended pt to f/u with Dr. Baird Cancer to discuss current medications -Continue 150 mg PO Iron Polysaccharide once a day  -Will see back in 6 months.    FOLLOW UP: RTC with Dr Irene Limbo with labs in 6 months   The total time spent in the appt was 20 minutes and more than 50% was on counseling and direct patient cares.  All of the patient's questions were answered with apparent satisfaction. The patient knows to call the clinic with any problems, questions or concerns.    Sullivan Lone MD [place] AAHIVMS [facility] Hematology/Oncology Physician [facility]  (Office):       [phone] (Work cell):  [phone] (Fax):           [phone]  I, Yevette Edwards, am acting as a scribe for Dr. Sullivan Lone.   .I have reviewed the above documentation for accuracy and completeness, and I agree with the above. Brunetta Genera MD

## 2019-08-11 ENCOUNTER — Telehealth: Payer: Self-pay | Admitting: Hematology

## 2019-08-11 NOTE — Telephone Encounter (Signed)
Scheduled per 06/07 los, patient has been called and notified.

## 2019-08-17 ENCOUNTER — Other Ambulatory Visit: Payer: Self-pay | Admitting: Internal Medicine

## 2019-09-09 ENCOUNTER — Ambulatory Visit (HOSPITAL_COMMUNITY)
Admission: RE | Admit: 2019-09-09 | Payer: Medicare Other | Source: Ambulatory Visit | Attending: Cardiovascular Disease | Admitting: Cardiovascular Disease

## 2019-09-20 ENCOUNTER — Ambulatory Visit: Payer: Medicare Other

## 2019-09-20 ENCOUNTER — Ambulatory Visit: Payer: Medicare Other | Admitting: Internal Medicine

## 2019-09-28 ENCOUNTER — Ambulatory Visit (INDEPENDENT_AMBULATORY_CARE_PROVIDER_SITE_OTHER): Payer: Medicare Other

## 2019-09-28 ENCOUNTER — Other Ambulatory Visit: Payer: Self-pay

## 2019-09-28 ENCOUNTER — Encounter: Payer: Self-pay | Admitting: Internal Medicine

## 2019-09-28 ENCOUNTER — Ambulatory Visit (INDEPENDENT_AMBULATORY_CARE_PROVIDER_SITE_OTHER): Payer: Medicare Other | Admitting: Internal Medicine

## 2019-09-28 VITALS — BP 162/80 | HR 60 | Temp 98.0°F | Ht 67.8 in | Wt 148.0 lb

## 2019-09-28 DIAGNOSIS — E039 Hypothyroidism, unspecified: Secondary | ICD-10-CM

## 2019-09-28 DIAGNOSIS — I10 Essential (primary) hypertension: Secondary | ICD-10-CM

## 2019-09-28 DIAGNOSIS — I739 Peripheral vascular disease, unspecified: Secondary | ICD-10-CM

## 2019-09-28 DIAGNOSIS — Z0000 Encounter for general adult medical examination without abnormal findings: Secondary | ICD-10-CM | POA: Diagnosis not present

## 2019-09-28 MED ORDER — AMLODIPINE BESYLATE 10 MG PO TABS: ORAL_TABLET | ORAL | 2 refills | Status: DC

## 2019-09-28 NOTE — Progress Notes (Signed)
I,Tianna Badgett,acting as a Education administrator for [name], MD.,have documented all relevant documentation on the behalf of [name], MD,as directed by  [name], MD while in the presence of [name], MD.  This visit occurred during the SARS-CoV-2 public health emergency.  Safety protocols were in place, including screening questions prior to the visit, additional usage of staff PPE, and extensive cleaning of exam room while observing appropriate contact time as indicated for disinfecting solutions.  Subjective:     Patient ID: Shawn Fernandez , male    DOB: [birth year] , 74 y.o.   MRN: [id number]   Chief Complaint  Patient presents with  . Thyroid Problem    HPI  He is here today for f/u hypothyroidism. He reports compliance with meds . He is currently taking levothyroxine 13mcg daily. He denies having any issues with the medication.   Hypertension This is a chronic problem. The current episode started more than 1 year ago. The problem is unchanged. The problem is controlled. Pertinent negatives include no blurred vision, chest pain, palpitations or shortness of breath. Risk factors for coronary artery disease include dyslipidemia, male gender and sedentary lifestyle. Identifiable causes of hypertension include a thyroid problem.     Past Medical History:  Diagnosis Date  . Blood clot in abdominal vein    treated at [facility]  . Carotid artery disease ([place])   . Dyslipidemia   . FHx: heart disease   . History of blood transfusion 8-9 yrs ago  . HTN (hypertension)   . Peripheral vascular disease ([place])      Family History  Problem Relation Age of Onset  . Heart disease Brother   . Heart disease Mother   . Cancer Father        ? type     Current Outpatient Medications:  .  amLODipine (NORVASC) 10 MG tablet, TAKE 1 TABLET BY MOUTH EVERY DAY IN THE MORNING, Disp: 90 tablet, Rfl: 2 .  aspirin EC 81 MG tablet, Take 81 mg by mouth daily., Disp: , Rfl:  .   Cholecalciferol (VITAMIN D3) 1000 units CAPS, Take 1 capsule by mouth daily., Disp: , Rfl:  .  cilostazol (PLETAL) 50 MG tablet, Take 50 mg by mouth every morning., Disp: , Rfl:  .  clopidogrel (PLAVIX) 75 MG tablet, TAKE 1 TABLET BY MOUTH EVERY DAY, Disp: 90 tablet, Rfl: 1 .  iron polysaccharides (NIFEREX) 150 MG capsule, Take 1 capsule (150 mg total) by mouth daily., Disp: 30 capsule, Rfl: 5 .  Iron-FA-B Cmp-C-Biot-Probiotic (FUSION PLUS) CAPS, TAKE 1 CAPSULE BY MOUTH EVERY DAY, Disp: 30 capsule, Rfl: 1 .  levothyroxine (SYNTHROID) 125 MCG tablet, Take 1 tablet (125 mcg total) by mouth daily. (Patient taking differently: Take 125 mcg by mouth daily. Monday - Saturday), Disp: 30 tablet, Rfl: 11 .  losartan (COZAAR) 100 MG tablet, TAKE 1 TABLET BY MOUTH EVERY DAY, Disp: 90 tablet, Rfl: 1 .  Multiple Vitamin (MULTIVITAMIN WITH MINERALS) TABS tablet, Take 1 tablet by mouth daily., Disp: , Rfl:  .  pantoprazole (PROTONIX) 40 MG tablet, TAKE 1 TABLET BY MOUTH EVERY DAY, Disp: 90 tablet, Rfl: 2 .  simvastatin (ZOCOR) 20 MG tablet, TAKE 1 TABLET BY MOUTH EVERY DAY IN THE EVENING, Disp: 90 tablet, Rfl: 1   Allergies  Allergen Reactions  . Lisinopril Cough     Review of Systems  Constitutional: Negative.   Eyes: Negative for blurred vision.  Respiratory: Negative.  Negative for shortness of  breath.   Cardiovascular: Negative.  Negative for chest pain and palpitations.  Gastrointestinal: Negative.   Neurological: Negative.      Today's Vitals   09/28/19 1427  BP: (!) 162/80  Pulse: 60  Temp: 98 F (36.7 C)  TempSrc: Oral  Weight: 148 lb (67.1 kg)  Height: 5' 7.8" (1.722 m)  PainSc: 0-No pain   Body mass index is 22.64 kg/m.   Objective:  Physical Exam Vitals and nursing note reviewed.  Constitutional:      Appearance: Normal appearance.  Cardiovascular:     Rate and Rhythm: Normal rate and regular rhythm.     Heart sounds: Normal heart sounds.  Pulmonary:     Effort: Pulmonary  effort is normal.     Breath sounds: Normal breath sounds.  Skin:    General: Skin is warm.  Neurological:     General: No focal deficit present.     Mental Status: He is alert.  Psychiatric:        Mood and Affect: Mood normal.         Assessment And Plan:     1. Acquired hypothyroidism  I will check thyroid panel and adjust meds as needed.  - TSH - T4, Free  2. Essential hypertension, benign  Chronic, uncontrolled. He was advised to take losartan in AM and amlodipine in PM. Importance of medication compliance was discussed with the patient. He agrees to rto in 1-2 weeks for a nurse visit.    BP Readings from Last 3 Encounters:  09/28/19 (!) 162/80  09/28/19 (!) 162/80  08/09/19 (!) 159/75   3. Peripheral arterial disease (HCC)  Chronic, yet stable. He denies having any claudication symptoms at this time.   Patient was given opportunity to ask questions. Patient verbalized understanding of the plan and was able to repeat key elements of the plan. All questions were answered to their satisfaction.  [name], MD   I, [name], MD, have reviewed all documentation for this visit. The documentation on 09/29/19 for the exam, diagnosis, procedures, and orders are all accurate and complete.  THE PATIENT IS ENCOURAGED TO PRACTICE SOCIAL DISTANCING DUE TO THE COVID-19 PANDEMIC.

## 2019-09-28 NOTE — Progress Notes (Signed)
This visit occurred during the SARS-CoV-2 public health emergency.  Safety protocols were in place, including screening questions prior to the visit, additional usage of staff PPE, and extensive cleaning of exam room while observing appropriate contact time as indicated for disinfecting solutions.  Subjective:   Shawn Fernandez is a 74 y.o. male who presents for Medicare Annual/Subsequent preventive examination.  Review of Systems     Cardiac Risk Factors include: advanced age (>43men, >53 women);hypertension;dyslipidemia;male gender     Objective:    Today's Vitals   09/28/19 1509  BP: (!) 162/80  Pulse: 60  Temp: 98 F (36.7 C)  TempSrc: Oral  Weight: 148 lb (67.1 kg)  Height: 5' 7.8" (1.722 m)   Body mass index is 22.64 kg/m.  Advanced Directives 09/28/2019 08/09/2019 09/15/2018 12/16/2017 03/21/2016 03/29/2015 03/15/2015  Does Patient Have a Medical Advance Directive? Yes No Yes Yes No No Yes  Type of Advance Directive Living will;Healthcare Power of [place];Living will Living will - - -  Does patient want to make changes to medical advance directive? - - - No - Patient declined - - -  Copy of [place] in Chart? No - copy requested - No - copy requested - - No - copy requested No - copy requested  Would patient like information on creating a medical advance directive? - No - Patient declined - - - - -    Current Medications (verified) Outpatient Encounter Medications as of 09/28/2019  Medication Sig  . amLODipine (NORVASC) 10 MG tablet TAKE 1 TABLET BY MOUTH EVERY DAY IN THE MORNING  . aspirin EC 81 MG tablet Take 81 mg by mouth daily.  . Cholecalciferol (VITAMIN D3) 1000 units CAPS Take 1 capsule by mouth daily.  . cilostazol (PLETAL) 50 MG tablet Take 50 mg by mouth every morning.  . clopidogrel (PLAVIX) 75 MG tablet TAKE 1 TABLET BY MOUTH EVERY DAY  . iron polysaccharides (NIFEREX) 150 MG capsule Take 1 capsule (150 mg  total) by mouth daily.  . Iron-FA-B Cmp-C-Biot-Probiotic (FUSION PLUS) CAPS TAKE 1 CAPSULE BY MOUTH EVERY DAY  . levothyroxine (SYNTHROID) 125 MCG tablet Take 1 tablet (125 mcg total) by mouth daily. (Patient taking differently: Take 125 mcg by mouth daily. Monday - Saturday)  . losartan (COZAAR) 100 MG tablet TAKE 1 TABLET BY MOUTH EVERY DAY  . Multiple Vitamin (MULTIVITAMIN WITH MINERALS) TABS tablet Take 1 tablet by mouth daily.  . pantoprazole (PROTONIX) 40 MG tablet TAKE 1 TABLET BY MOUTH EVERY DAY  . simvastatin (ZOCOR) 20 MG tablet TAKE 1 TABLET BY MOUTH EVERY DAY IN THE EVENING   No facility-administered encounter medications on file as of 09/28/2019.    Allergies (verified) Lisinopril   History: Past Medical History:  Diagnosis Date  . Blood clot in abdominal vein    treated at [facility]  . Carotid artery disease ([place])   . Dyslipidemia   . FHx: heart disease   . History of blood transfusion 8-9 yrs ago  . HTN (hypertension)   . Peripheral vascular disease [facility])    Past Surgical History:  Procedure Laterality Date  . ANKLE SURGERY Right yrs ago  . arm surgery Left yrs ago   2 rods inserted, 1 rod later removed  . COLONOSCOPY WITH PROPOFOL N/A 09/16/2013   Procedure: COLONOSCOPY WITH PROPOFOL;  Surgeon: Beryle Beams, MD;  Location: [facility] ENDOSCOPY;  Service: Endoscopy;  Laterality: N/A;  . LOWER EXTREMITY ANGIOGRAM N/A 07/24/2011  Procedure: LOWER EXTREMITY ANGIOGRAM;  Surgeon: Lorretta Harp, MD;  Location: [facility] CATH LAB;  Service: Cardiovascular;  Laterality: N/A;  . stent in abdominal vein  4-5 yrs ago  . US ECHOCARDIOGRAPHY  07-20-2007   EF 55-60%   Family History  Problem Relation Age of Onset  . Heart disease Brother   . Heart disease Mother   . Cancer Father        ? type   Social History   Socioeconomic History  . Marital status: Married    Spouse name: Not on file  . Number of children: Not on file  . Years of education: Not on file  . Highest education  level: Not on file  Occupational History  . Occupation: retired  Tobacco Use  . Smoking status: Former Smoker    Packs/day: 0.75    Years: 20.00    Pack years: 15.00    Types: Cigarettes    Quit date: 07/01/2009    Years since quitting: 10.2  . Smokeless tobacco: Never Used  Vaping Use  . Vaping Use: Never used  Substance and Sexual Activity  . Alcohol use: Not Currently    Comment: only occ  . Drug use: No  . Sexual activity: Yes  Other Topics Concern  . Not on file  Social History Narrative  . Not on file   Social Determinants of Health   Financial Resource Strain: Low Risk   . Difficulty of Paying Living Expenses: Not hard at all  Food Insecurity: No Food Insecurity  . Worried About Charity fundraiser in the Last Year: Never true  . Ran Out of Food in the Last Year: Never true  Transportation Needs: No Transportation Needs  . Lack of Transportation (Medical): No  . Lack of Transportation (Non-Medical): No  Physical Activity: Inactive  . Days of Exercise per Week: 0 days  . Minutes of Exercise per Session: 0 min  Stress: No Stress Concern Present  . Feeling of Stress : Not at all  Social Connections:   . Frequency of Communication with Friends and Family:   . Frequency of Social Gatherings with Friends and Family:   . Attends Religious Services:   . Active Member of Clubs or Organizations:   . Attends Archivist Meetings:   Marland Kitchen Marital Status:     Tobacco Counseling Counseling given: Not Answered   Clinical Intake:  Pre-visit preparation completed: Yes  Pain : No/denies pain     Nutritional Status: BMI of 19-24  Normal Nutritional Risks: None Diabetes: No  How often do you need to have someone help you when you read instructions, pamphlets, or other written materials from your doctor or pharmacy?: 1 - Never What is the last grade level you completed in school?: 2 years college  Diabetic? no  Interpreter Needed?: No  Information entered  by :: NAllen LPN   Activities of Daily Living In your present state of health, do you have any difficulty performing the following activities: 09/28/2019  Hearing? N  Vision? N  Difficulty concentrating or making decisions? N  Walking or climbing stairs? N  Dressing or bathing? N  Doing errands, shopping? N  Preparing Food and eating ? N  Using the Toilet? N  In the past six months, have you accidently leaked urine? N  Do you have problems with loss of bowel control? N  Managing your Medications? N  Managing your Finances? N  Housekeeping or managing your Housekeeping? N  Some recent  data might be hidden    Patient Care Team: [name], MD as PCP - General (Internal Medicine) Lorretta Harp, MD as PCP - Cardiology (Cardiology) Lorretta Harp, MD as Consulting Physician (Cardiology)  Indicate any recent Medical Services you may have received from other than [facility] providers in the past year (date may be approximate).     Assessment:   This is a routine wellness examination for Suhaan.  Hearing/Vision screen  Hearing Screening   125Hz  250Hz  500Hz  1000Hz  2000Hz  3000Hz  4000Hz  6000Hz  8000Hz   Right ear:           Left ear:           Vision Screening Comments: Regular Eye Exams, Dr. Katy Fitch  Dietary issues and exercise activities discussed: Current Exercise Habits: The patient has a physically strenuous job, but has no regular exercise apart from work.  Goals    . Patient Stated     Stay healthy    . Patient Stated     09/28/2019, stay healthy      Depression Screen PHQ 2/9 Scores 09/28/2019 07/13/2019 11/09/2018 11/03/2018 09/15/2018 06/17/2018 12/16/2017  PHQ - 2 Score 0 0 0 0 0 0 0  PHQ- 9 Score 0 - - - 0 - -    Fall Risk Fall Risk  09/28/2019 07/13/2019 11/09/2018 11/09/2018 11/03/2018  Falls in the past year? 0 0 1 0 0  Number falls in past yr: - - 0 0 -  Injury with Fall? - - 0 - -  Risk for fall due to : Medication side effect - - - -  Follow up Falls evaluation  completed;Education provided;Falls prevention discussed - - - -    Any stairs in or around the home? No  If so, are there any without handrails? n/a Home free of loose throw rugs in walkways, pet beds, electrical cords, etc? Yes  Adequate lighting in your home to reduce risk of falls? Yes   ASSISTIVE DEVICES UTILIZED TO PREVENT FALLS:  Life alert? No  Use of a cane, walker or w/c? No  Grab bars in the bathroom? Yes  Shower chair or bench in shower? No  Elevated toilet seat or a handicapped toilet? Yes   TIMED UP AND GO:  Was the test performed? No .    Gait steady and fast without use of assistive device  Cognitive Function:     6CIT Screen 09/28/2019 09/15/2018 12/16/2017  What Year? 0 points 0 points 0 points  What month? 0 points 0 points 0 points  What time? 0 points 0 points 0 points  Count back from 20 0 points 0 points 0 points  Months in reverse 4 points 0 points 0 points  Repeat phrase 2 points 0 points 0 points  Total Score 6 0 0    Immunizations Immunization History  Administered Date(s) Administered  . Influenza, High Dose Seasonal PF 11/04/2016, 12/02/2017, 01/01/2019  . Influenza-Unspecified 12/04/2017, 01/01/2019  . Pneumococcal Conjugate-13 12/04/2017  . Pneumococcal-Unspecified 11/30/2014    TDAP status: Up to date Flu Vaccine status: Up to date Pneumococcal vaccine status: Up to date Covid-19 vaccine status: Completed vaccines  Qualifies for Shingles Vaccine? Yes   Zostavax completed No   Shingrix Completed?: No.    Education has been provided regarding the importance of this vaccine. Patient has been advised to call insurance company to determine out of pocket expense if they have not yet received this vaccine. Advised may also receive vaccine at local pharmacy or Health Dept.  Verbalized acceptance and understanding.  Screening Tests Health Maintenance  Topic Date Due  . COVID-19 Vaccine (1) Never done  . INFLUENZA VACCINE  10/02/2019  .  TETANUS/TDAP  11/17/2022  . COLONOSCOPY  02/08/2029  . Hepatitis C Screening  Completed  . PNA vac Low Risk Adult  Completed    Health Maintenance  Health Maintenance Due  Topic Date Due  . COVID-19 Vaccine (1) Never done    Colorectal cancer screening: Completed 02/09/2019. Repeat every 5 years  Lung Cancer Screening: (Low Dose CT Chest recommended if Age 21-80 years, 30 pack-year currently smoking OR have quit w/in 15years.) does not qualify.   Lung Cancer Screening Referral: no  Additional Screening:  Hepatitis C Screening: does qualify; Completed 11/03/2018  Vision Screening: Recommended annual ophthalmology exams for early detection of glaucoma and other disorders of the eye. Is the patient up to date with their annual eye exam?  Yes  Who is the provider or what is the name of the office in which the patient attends annual eye exams? Dr. Katy Fitch If pt is not established with a provider, would they like to be referred to a provider to establish care? No .   Dental Screening: Recommended annual dental exams for proper oral hygiene  Community Resource Referral / Chronic Care Management: CRR required this visit?  No   CCM required this visit?  No      Plan:     I have personally reviewed and noted the following in the patient's chart:   . Medical and social history . Use of alcohol, tobacco or illicit drugs  . Current medications and supplements . Functional ability and status . Nutritional status . Physical activity . Advanced directives . List of other physicians . Hospitalizations, surgeries, and ER visits in previous 12 months . Vitals . Screenings to include cognitive, depression, and falls . Referrals and appointments  In addition, I have reviewed and discussed with patient certain preventive protocols, quality metrics, and best practice recommendations. A written personalized care plan for preventive services as well as general preventive health  recommendations were provided to patient.     Kellie Simmering, LPN   [id number]   Nurse Notes: Will call with dates for covid vaccines.

## 2019-09-28 NOTE — Patient Instructions (Addendum)
Start taking losartan in AM Amlodipine ,continue to take in PM'   Hypothyroidism  Hypothyroidism is when the thyroid gland does not make enough of certain hormones (it is underactive). The thyroid gland is a small gland located in the lower front part of the neck, just in front of the windpipe (trachea). This gland makes hormones that help control how the body uses food for energy (metabolism) as well as how the heart and brain function. These hormones also play a role in keeping your bones strong. When the thyroid is underactive, it produces too little of the hormones thyroxine (T4) and triiodothyronine (T3). What are the causes? This condition may be caused by:  Hashimoto's disease. This is a disease in which the body's disease-fighting system (immune system) attacks the thyroid gland. This is the most common cause.  Viral infections.  Pregnancy.  Certain medicines.  Birth defects.  Past radiation treatments to the head or neck for cancer.  Past treatment with radioactive iodine.  Past exposure to radiation in the environment.  Past surgical removal of part or all of the thyroid.  Problems with a gland in the center of the brain (pituitary gland).  Lack of enough iodine in the diet. What increases the risk? You are more likely to develop this condition if:  You are male.  You have a family history of thyroid conditions.  You use a medicine called lithium.  You take medicines that affect the immune system (immunosuppressants). What are the signs or symptoms? Symptoms of this condition include:  Feeling as though you have no energy (lethargy).  Not being able to tolerate cold.  Weight gain that is not explained by a change in diet or exercise habits.  Lack of appetite.  Dry skin.  Coarse hair.  Menstrual irregularity.  Slowing of thought processes.  Constipation.  Sadness or depression. How is this diagnosed? This condition may be diagnosed based  on:  Your symptoms, your medical history, and a physical exam.  Blood tests. You may also have imaging tests, such as an ultrasound or MRI. How is this treated? This condition is treated with medicine that replaces the thyroid hormones that your body does not make. After you begin treatment, it may take several weeks for symptoms to go away. Follow these instructions at home:  Take over-the-counter and prescription medicines only as told by your health care provider.  If you start taking any new medicines, tell your health care provider.  Keep all follow-up visits as told by your health care provider. This is important. ? As your condition improves, your dosage of thyroid hormone medicine may change. ? You will need to have blood tests regularly so that your health care provider can monitor your condition. Contact a health care provider if:  Your symptoms do not get better with treatment.  You are taking thyroid replacement medicine and you: ? Sweat a lot. ? Have tremors. ? Feel anxious. ? Lose weight rapidly. ? Cannot tolerate heat. ? Have emotional swings. ? Have diarrhea. ? Feel weak. Get help right away if you have:  Chest pain.  An irregular heartbeat.  A rapid heartbeat.  Difficulty breathing. Summary  Hypothyroidism is when the thyroid gland does not make enough of certain hormones (it is underactive).  When the thyroid is underactive, it produces too little of the hormones thyroxine (T4) and triiodothyronine (T3).  The most common cause is Hashimoto's disease, a disease in which the body's disease-fighting system (immune system) attacks the thyroid gland.  The condition can also be caused by viral infections, medicine, pregnancy, or past radiation treatment to the head or neck.  Symptoms may include weight gain, dry skin, constipation, feeling as though you do not have energy, and not being able to tolerate cold.  This condition is treated with medicine to  replace the thyroid hormones that your body does not make. This information is not intended to replace advice given to you by your health care provider. Make sure you discuss any questions you have with your health care provider. Document Revised: 01/30/2017 Document Reviewed: 01/28/2017 Elsevier Patient Education  2020 [employer].

## 2019-09-28 NOTE — Patient Instructions (Signed)
Shawn Fernandez , Thank you for taking time to come for your Medicare Wellness Visit. I appreciate your ongoing commitment to your health goals. Please review the following plan we discussed and let me know if I can assist you in the future.   Screening recommendations/referrals: Colonoscopy: completed 02/09/2019 Recommended yearly ophthalmology/optometry visit for glaucoma screening and checkup Recommended yearly dental visit for hygiene and checkup  Vaccinations: Influenza vaccine: completed 01/01/2019 Pneumococcal vaccine: completed 12/04/2017 Tdap vaccine: completed 11/16/2012 Shingles vaccine: discussed   Covid-19: will call with dates  Advanced directives: Please bring a copy of your POA (Power of [place]) and/or Living Will to your next appointment.   Conditions/risks identified: none  Next appointment: 01/09/2020 at 2:15  Follow up in one year for your annual wellness visit.   Preventive Care 74 Years and Older, Male Preventive care refers to lifestyle choices and visits with your health care provider that can promote health and wellness. What does preventive care include?  A yearly physical exam. This is also called an annual well check.  Dental exams once or twice a year.  Routine eye exams. Ask your health care provider how often you should have your eyes checked.  Personal lifestyle choices, including:  Daily care of your teeth and gums.  Regular physical activity.  Eating a healthy diet.  Avoiding tobacco and drug use.  Limiting alcohol use.  Practicing safe sex.  Taking low doses of aspirin every day.  Taking vitamin and mineral supplements as recommended by your health care provider. What happens during an annual well check? The services and screenings done by your health care provider during your annual well check will depend on your age, overall health, lifestyle risk factors, and family history of disease. Counseling  Your health care provider may ask  you questions about your:  Alcohol use.  Tobacco use.  Drug use.  Emotional well-being.  Home and relationship well-being.  Sexual activity.  Eating habits.  History of falls.  Memory and ability to understand (cognition).  Work and work Statistician. Screening  You may have the following tests or measurements:  Height, weight, and BMI.  Blood pressure.  Lipid and cholesterol levels. These may be checked every 5 years, or more frequently if you are over 49 years old.  Skin check.  Lung cancer screening. You may have this screening every year starting at age 74 if you have a 30-pack-year history of smoking and currently smoke or have quit within the past 15 years.  Fecal occult blood test (FOBT) of the stool. You may have this test every year starting at age 52.  Flexible sigmoidoscopy or colonoscopy. You may have a sigmoidoscopy every 5 years or a colonoscopy every 10 years starting at age 61.  Prostate cancer screening. Recommendations will vary depending on your family history and other risks.  Hepatitis C blood test.  Hepatitis B blood test.  Sexually transmitted disease (STD) testing.  Diabetes screening. This is done by checking your blood sugar (glucose) after you have not eaten for a while (fasting). You may have this done every 1-3 years.  Abdominal aortic aneurysm (AAA) screening. You may need this if you are a current or former smoker.  Osteoporosis. You may be screened starting at age 46 if you are at high risk. Talk with your health care provider about your test results, treatment options, and if necessary, the need for more tests. Vaccines  Your health care provider may recommend certain vaccines, such as:  Influenza vaccine. This  is recommended every year.  Tetanus, diphtheria, and acellular pertussis (Tdap, Td) vaccine. You may need a Td booster every 10 years.  Zoster vaccine. You may need this after age 37.  Pneumococcal 13-valent conjugate  (PCV13) vaccine. One dose is recommended after age 64.  Pneumococcal polysaccharide (PPSV23) vaccine. One dose is recommended after age 32. Talk to your health care provider about which screenings and vaccines you need and how often you need them. This information is not intended to replace advice given to you by your health care provider. Make sure you discuss any questions you have with your health care provider. Document Released: 03/16/2015 Document Revised: 11/07/2015 Document Reviewed: 12/19/2014 Elsevier Interactive Patient Education  2017 [employer] Prevention in the Home Falls can cause injuries. They can happen to people of all ages. There are many things you can do to make your home safe and to help prevent falls. What can I do on the outside of my home?  Regularly fix the edges of walkways and driveways and fix any cracks.  Remove anything that might make you trip as you walk through a door, such as a raised step or threshold.  Trim any bushes or trees on the path to your home.  Use bright outdoor lighting.  Clear any walking paths of anything that might make someone trip, such as rocks or tools.  Regularly check to see if handrails are loose or broken. Make sure that both sides of any steps have handrails.  Any raised decks and porches should have guardrails on the edges.  Have any leaves, snow, or ice cleared regularly.  Use sand or salt on walking paths during winter.  Clean up any spills in your garage right away. This includes oil or grease spills. What can I do in the bathroom?  Use night lights.  Install grab bars by the toilet and in the tub and shower. Do not use towel bars as grab bars.  Use non-skid mats or decals in the tub or shower.  If you need to sit down in the shower, use a plastic, non-slip stool.  Keep the floor dry. Clean up any water that spills on the floor as soon as it happens.  Remove soap buildup in the tub or shower  regularly.  Attach bath mats securely with double-sided non-slip rug tape.  Do not have throw rugs and other things on the floor that can make you trip. What can I do in the bedroom?  Use night lights.  Make sure that you have a light by your bed that is easy to reach.  Do not use any sheets or blankets that are too big for your bed. They should not hang down onto the floor.  Have a firm chair that has side arms. You can use this for support while you get dressed.  Do not have throw rugs and other things on the floor that can make you trip. What can I do in the kitchen?  Clean up any spills right away.  Avoid walking on wet floors.  Keep items that you use a lot in easy-to-reach places.  If you need to reach something above you, use a strong step stool that has a grab bar.  Keep electrical cords out of the way.  Do not use floor polish or wax that makes floors slippery. If you must use wax, use non-skid floor wax.  Do not have throw rugs and other things on the floor that can make you  trip. What can I do with my stairs?  Do not leave any items on the stairs.  Make sure that there are handrails on both sides of the stairs and use them. Fix handrails that are broken or loose. Make sure that handrails are as long as the stairways.  Check any carpeting to make sure that it is firmly attached to the stairs. Fix any carpet that is loose or worn.  Avoid having throw rugs at the top or bottom of the stairs. If you do have throw rugs, attach them to the floor with carpet tape.  Make sure that you have a light switch at the top of the stairs and the bottom of the stairs. If you do not have them, ask someone to add them for you. What else can I do to help prevent falls?  Wear shoes that:  Do not have high heels.  Have rubber bottoms.  Are comfortable and fit you well.  Are closed at the toe. Do not wear sandals.  If you use a stepladder:  Make sure that it is fully  opened. Do not climb a closed stepladder.  Make sure that both sides of the stepladder are locked into place.  Ask someone to hold it for you, if possible.  Clearly mark and make sure that you can see:  Any grab bars or handrails.  First and last steps.  Where the edge of each step is.  Use tools that help you move around (mobility aids) if they are needed. These include:  Canes.  Walkers.  Scooters.  Crutches.  Turn on the lights when you go into a dark area. Replace any light bulbs as soon as they burn out.  Set up your furniture so you have a clear path. Avoid moving your furniture around.  If any of your floors are uneven, fix them.  If there are any pets around you, be aware of where they are.  Review your medicines with your doctor. Some medicines can make you feel dizzy. This can increase your chance of falling. Ask your doctor what other things that you can do to help prevent falls. This information is not intended to replace advice given to you by your health care provider. Make sure you discuss any questions you have with your health care provider. Document Released: 12/14/2008 Document Revised: 07/26/2015 Document Reviewed: 03/24/2014 Elsevier Interactive Patient Education  2017 [employer].

## 2019-09-29 ENCOUNTER — Telehealth: Payer: Self-pay

## 2019-09-29 LAB — TSH: TSH: 12.8 u[IU]/mL — ABNORMAL HIGH (ref 0.450–4.500)

## 2019-09-29 LAB — T4, FREE: Free T4: 0.96 ng/dL (ref 0.82–1.77)

## 2019-09-29 NOTE — Telephone Encounter (Signed)
-----   Message from [name], MD sent at 09/29/2019 12:19 PM EDT ----- Your thyroid function has improved. However, we need to continue to adjust your meds. Please continue with Synthroid 131mcg, I would like for you to take 1/2 pill on Sunday (use a pill cutter) and one tablet M-Sat. We will recheck at next visit. (pls confirm he has another one this year)

## 2019-09-29 NOTE — Telephone Encounter (Signed)
lvm for pt to return call for lab results  

## 2019-10-05 ENCOUNTER — Ambulatory Visit (HOSPITAL_COMMUNITY)
Admission: RE | Admit: 2019-10-05 | Discharge: 2019-10-05 | Disposition: A | Discharge status: Home / Self Care | Payer: Medicare Other | Source: Ambulatory Visit | Attending: Cardiovascular Disease | Admitting: Cardiovascular Disease

## 2019-10-05 ENCOUNTER — Other Ambulatory Visit (HOSPITAL_COMMUNITY): Payer: Self-pay | Admitting: Cardiovascular Disease

## 2019-10-05 ENCOUNTER — Other Ambulatory Visit: Payer: Self-pay

## 2019-10-05 DIAGNOSIS — I6523 Occlusion and stenosis of bilateral carotid arteries: Secondary | ICD-10-CM

## 2019-10-24 ENCOUNTER — Encounter (HOSPITAL_COMMUNITY): Payer: Self-pay

## 2019-10-24 ENCOUNTER — Other Ambulatory Visit: Payer: Self-pay

## 2019-10-24 ENCOUNTER — Ambulatory Visit (HOSPITAL_COMMUNITY)
Admission: EM | Admit: 2019-10-24 | Discharge: 2019-10-24 | Disposition: A | Discharge status: Home / Self Care | Payer: Medicare Other | Attending: Family Medicine | Admitting: Family Medicine

## 2019-10-24 DIAGNOSIS — I493 Ventricular premature depolarization: Secondary | ICD-10-CM | POA: Diagnosis not present

## 2019-10-24 DIAGNOSIS — Z79890 Hormone replacement therapy (postmenopausal): Secondary | ICD-10-CM | POA: Insufficient documentation

## 2019-10-24 DIAGNOSIS — R05 Cough: Secondary | ICD-10-CM | POA: Diagnosis present

## 2019-10-24 DIAGNOSIS — E039 Hypothyroidism, unspecified: Secondary | ICD-10-CM | POA: Diagnosis not present

## 2019-10-24 DIAGNOSIS — Z7982 Long term (current) use of aspirin: Secondary | ICD-10-CM | POA: Diagnosis not present

## 2019-10-24 DIAGNOSIS — F17210 Nicotine dependence, cigarettes, uncomplicated: Secondary | ICD-10-CM | POA: Insufficient documentation

## 2019-10-24 DIAGNOSIS — U071 COVID-19: Secondary | ICD-10-CM | POA: Insufficient documentation

## 2019-10-24 DIAGNOSIS — J069 Acute upper respiratory infection, unspecified: Secondary | ICD-10-CM

## 2019-10-24 DIAGNOSIS — E785 Hyperlipidemia, unspecified: Secondary | ICD-10-CM | POA: Diagnosis not present

## 2019-10-24 DIAGNOSIS — R197 Diarrhea, unspecified: Secondary | ICD-10-CM | POA: Insufficient documentation

## 2019-10-24 DIAGNOSIS — I779 Disorder of arteries and arterioles, unspecified: Secondary | ICD-10-CM | POA: Diagnosis not present

## 2019-10-24 DIAGNOSIS — I10 Essential (primary) hypertension: Secondary | ICD-10-CM | POA: Diagnosis not present

## 2019-10-24 DIAGNOSIS — Z888 Allergy status to other drugs, medicaments and biological substances status: Secondary | ICD-10-CM | POA: Diagnosis not present

## 2019-10-24 DIAGNOSIS — Z79899 Other long term (current) drug therapy: Secondary | ICD-10-CM | POA: Insufficient documentation

## 2019-10-24 MED ORDER — BENZONATATE 200 MG PO CAPS
200.0000 mg | ORAL_CAPSULE | Freq: Two times a day (BID) | ORAL | 0 refills | Status: DC | PRN
Start: 2019-10-24 — End: 2020-07-09

## 2019-10-24 NOTE — ED Triage Notes (Signed)
Pt present coughing, fatigue and diarrhea with a fever at night time. Symptoms started four days ago.

## 2019-10-24 NOTE — ED Provider Notes (Signed)
[place]    CSN: [id number] Arrival date & time: 10/24/19  1311      History   Chief Complaint Chief Complaint  Patient presents with  . Cough  . Fatigue  . Diarrhea    HPI NOSSON Fernandez is a 74 y.o. male.   HPI  Patient is here for cough infection symptoms.  Is been going on for 4 days.  He is coughing and chest congestion.  He states he is feeling a little more tired.  In addition has had some diarrhea.  No recent travel.  No known exposure to illness.  Patient has not been around anyone with Covid.  He has had his Covid vaccination.  He and his wife both have diarrhea.  They deny any food intolerance.  He has not taken any medicine.  Hoping for medicine to help with his cough.  He is a smoker.  Denies COPD.  Past Medical History:  Diagnosis Date  . Blood clot in abdominal vein    treated at [facility]  . Carotid artery disease ([place])   . Dyslipidemia   . FHx: heart disease   . History of blood transfusion 8-9 yrs ago  . HTN (hypertension)   . Peripheral vascular disease [facility])     Patient Active Problem List   Diagnosis Date Noted  . Acquired hypothyroidism 11/21/2017  . Iron deficiency anemia due to chronic blood loss 03/29/2015  . Anemia 03/15/2015  . Hyperthyroidism 01/04/2015  . Claudication ([place]) 06/29/2014  . Essential hypertension 10/27/2012  . Hyperlipidemia 10/27/2012  . Peripheral arterial disease ([place]) 10/27/2012  . Carotid artery disease ([place]) 10/27/2012  . Cough 11/06/2011    Past Surgical History:  Procedure Laterality Date  . ANKLE SURGERY Right yrs ago  . arm surgery Left yrs ago   2 rods inserted, 1 rod later removed  . COLONOSCOPY WITH PROPOFOL N/A 09/16/2013   Procedure: COLONOSCOPY WITH PROPOFOL;  Surgeon: Beryle Beams, MD;  Location: [facility] ENDOSCOPY;  Service: Endoscopy;  Laterality: N/A;  . LOWER EXTREMITY ANGIOGRAM N/A 07/24/2011   Procedure: LOWER EXTREMITY ANGIOGRAM;  Surgeon: Lorretta Harp, MD;  Location: [facility] CATH LAB;   Service: Cardiovascular;  Laterality: N/A;  . stent in abdominal vein  4-5 yrs ago  . US ECHOCARDIOGRAPHY  07-20-2007   EF 55-60%       Home Medications    Prior to Admission medications   Medication Sig Start Date End Date Taking? Authorizing Provider  amLODipine (NORVASC) 10 MG tablet TAKE 1 TABLET BY MOUTH EVERY DAY IN THE MORNING 09/28/19   [name], MD  aspirin EC 81 MG tablet Take 81 mg by mouth daily.    [provider]  benzonatate (TESSALON) 200 MG capsule Take 1 capsule (200 mg total) by mouth 2 (two) times daily as needed for cough. 10/24/19   Raylene Everts, MD  Cholecalciferol (VITAMIN D3) 1000 units CAPS Take 1 capsule by mouth daily.    [provider]  cilostazol (PLETAL) 50 MG tablet Take 50 mg by mouth every morning.    [provider]  clopidogrel (PLAVIX) 75 MG tablet TAKE 1 TABLET BY MOUTH EVERY DAY 03/29/19   [name], MD  iron polysaccharides (NIFEREX) 150 MG capsule Take 1 capsule (150 mg total) by mouth daily. 12/16/18   [name], MD  Iron-FA-B Cmp-C-Biot-Probiotic (FUSION PLUS) CAPS TAKE 1 CAPSULE BY MOUTH EVERY DAY 06/07/19   [name], MD  levothyroxine (SYNTHROID) 125 MCG tablet Take 1 tablet (  125 mcg total) by mouth daily. Patient taking differently: Take 125 mcg by mouth daily. Monday - Saturday 11/03/18 11/03/19  [name], MD  losartan (COZAAR) 100 MG tablet TAKE 1 TABLET BY MOUTH EVERY DAY 07/20/19   [name], MD  Multiple Vitamin (MULTIVITAMIN WITH MINERALS) TABS tablet Take 1 tablet by mouth daily.    [provider]  pantoprazole (PROTONIX) 40 MG tablet TAKE 1 TABLET BY MOUTH EVERY DAY 08/17/19   [name], MD  simvastatin (ZOCOR) 20 MG tablet TAKE 1 TABLET BY MOUTH EVERY DAY IN THE EVENING 05/10/19   [name], MD    Family History Family History  Problem Relation Age of Onset  . Heart disease Brother   . Heart disease Mother   . Cancer Father        ? type    Social  History Social History   Tobacco Use  . Smoking status: Former Smoker    Packs/day: 0.75    Years: 20.00    Pack years: 15.00    Types: Cigarettes    Quit date: 07/01/2009    Years since quitting: 10.3  . Smokeless tobacco: Never Used  Vaping Use  . Vaping Use: Never used  Substance Use Topics  . Alcohol use: Not Currently    Comment: only occ  . Drug use: No     Allergies   Lisinopril   Review of Systems Review of Systems See HPI  Physical Exam Triage Vital Signs ED Triage Vitals  Enc Vitals Group     BP 10/24/19 1513 (!) 177/74     Pulse Rate 10/24/19 1513 (!) 53     Resp 10/24/19 1513 (!) 22     Temp 10/24/19 1513 99.7 F (37.6 C)     Temp Source 10/24/19 1513 Oral     SpO2 10/24/19 1513 95 %     Weight --      Height --      Head Circumference --      Peak Flow --      Pain Score 10/24/19 1514 0     Pain Loc --      Pain Edu? --      Excl. in [place]? --    No data found.  Updated Vital Signs BP (!) 177/74   Pulse (!) 53   Temp 99.7 F (37.6 C) (Oral)   Resp (!) 22   SpO2 95%      Physical Exam Constitutional:      General: He is not in acute distress.    Appearance: He is well-developed and normal weight.  HENT:     Head: Normocephalic and atraumatic.     Mouth/Throat:     Mouth: Mucous membranes are dry.  Eyes:     Conjunctiva/sclera: Conjunctivae normal.     Pupils: Pupils are equal, round, and reactive to light.  Cardiovascular:     Rate and Rhythm: Normal rate and regular rhythm.     Heart sounds: Normal heart sounds.  Pulmonary:     Effort: Pulmonary effort is normal. No respiratory distress.     Breath sounds: Normal breath sounds.     Comments: Lungs are clear Abdominal:     General: There is no distension.     Palpations: Abdomen is soft.  Musculoskeletal:        General: Normal range of motion.     Cervical back: Normal range of motion.     Comments: Permanent disabling deformity of left arm.  Old fractures.  Nerve damage.   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  Neurological:     Mental Status: He is alert.  Psychiatric:        Behavior: Behavior normal.      UC Treatments / Results  Labs (all labs ordered are listed, but only abnormal results are displayed) Labs Reviewed  SARS CORONAVIRUS 2 (TAT 6-24 HRS)    EKG   Radiology No results found.  Procedures Procedures (including critical care time)  Medications Ordered in UC Medications - No data to display  Initial Impression / Assessment and Plan / UC Course  I have reviewed the triage vital signs and the nursing notes.  Pertinent labs & imaging results that were available during my care of the patient were reviewed by me and considered in my medical decision making (see chart for details).     Reviewed this is a viral illness.  Uncertain if it is Covid.  Unlikely given Covid vaccination.  Quarantine at home.  Symptomatic care reviewed Final Clinical Impressions(s) / UC Diagnoses   Final diagnoses:  Viral upper respiratory illness     Discharge Instructions     Drink more water Take the tessalon as needed for cough Check results on My Chart for COVID which   ED Prescriptions    Medication Sig Dispense Auth. Provider   benzonatate (TESSALON) 200 MG capsule Take 1 capsule (200 mg total) by mouth 2 (two) times daily as needed for cough. 20 capsule Raylene Everts, MD     PDMP not reviewed this encounter.   Raylene Everts, MD 10/24/19 [phone]

## 2019-10-24 NOTE — Discharge Instructions (Signed)
Drink more water Take the tessalon as needed for cough Check results on My Chart for COVID which

## 2019-10-25 ENCOUNTER — Ambulatory Visit: Payer: Medicare Other

## 2019-10-25 ENCOUNTER — Telehealth: Payer: Self-pay

## 2019-10-25 LAB — SARS CORONAVIRUS 2 (TAT 6-24 HRS): SARS Coronavirus 2: POSITIVE — AB

## 2019-10-25 NOTE — Telephone Encounter (Signed)
I returned the pt's call and notified him that the nurse visit appt was cancelled.

## 2019-10-26 ENCOUNTER — Telehealth: Payer: Self-pay | Admitting: Infectious Diseases

## 2019-10-26 NOTE — Telephone Encounter (Signed)
Please contact pt  - he qualifies for antibody treatment for COVID. He needs to get this treatment to decrease risk of hospitalization.

## 2019-10-26 NOTE — Telephone Encounter (Signed)
Called to Discuss with patient about Covid symptoms and the use of the monoclonal antibody infusion for those with mild to moderate Covid symptoms and at a high risk of hospitalization.     Pt appears to qualify for this infusion due to co-morbid conditions and/or a member of an at-risk group in accordance with the FDA Emergency Use Authorization.   Symptoms started 8/22. Eligible for infusion through 9/01. It appears he is vaccinated and his wife has symptoms also.    Unable to reach pt or leave VM. Will message PCP in an effort to reach patient for [facility] treatment

## 2019-10-27 ENCOUNTER — Telehealth: Payer: Self-pay

## 2019-10-27 ENCOUNTER — Other Ambulatory Visit: Payer: Self-pay | Admitting: Hospice and Palliative Medicine

## 2019-10-27 ENCOUNTER — Telehealth (HOSPITAL_COMMUNITY): Payer: Self-pay

## 2019-10-27 DIAGNOSIS — U071 COVID-19: Secondary | ICD-10-CM

## 2019-10-27 NOTE — Telephone Encounter (Signed)
The pt's wife was called to notify her that Dr. Baird Cancer got a message that the pt's voicemail was full and that the region for infectious diseases has been trying to contact the pt about  consideration of monoclonal antibody treatment for COVID.  Mrs. Interrante became upset because she didn't know that the pt's test was negative. Mrs. Krupka was told that they couldn't leave a message because the voicemail was full and that is why I tried her number.  She was told that Dr. Baird Cancer said that the pt's information would be sent and that the office would make sure that they have Mrs. Pressleys number.

## 2019-10-27 NOTE — Telephone Encounter (Signed)
Pt wife called in concerning his lab results. Pt tested positive for covid 19 and qualify for an antibiotic. Staff informed his wife to reach out to [facility] concerning the medication. I also sent the Nurse Ulla Potash wiggins a message informing her that the patient wife Katharine Look was on the phone and would like to know what steps they should take for the medication. If someone could please call his wife at [phone] since patient voicemail is full on his phone.

## 2019-10-27 NOTE — Progress Notes (Signed)
I connected by phone with  Shawn Fernandez on 10/27/2019 to discuss the potential use of an new treatment for mild to moderate COVID-19 viral infection in non-hospitalized patients.   This patient is a age/sex that meets the FDA criteria for Emergency Use Authorization of casirivimab\imdevimab.  Has a (+) direct SARS-CoV-2 viral test result 1. Has mild or moderate COVID-19  2. Is ? 74 years of age and weighs ? 40 kg 3. Is NOT hospitalized due to COVID-19 4. Is NOT requiring oxygen therapy or requiring an increase in baseline oxygen flow rate due to COVID-19 5. Is within 10 days of symptom onset 6. Has at least one of the high risk factor(s) for progression to severe COVID-19 and/or hospitalization as defined in EUA. ? Specific high risk criteria : age   Symptom onset: 8/23   I have spoken and communicated the following to the patient or parent/caregiver:   1. FDA has authorized the emergency use of casirivimab\imdevimab for the treatment of mild to moderate COVID-19 in adults and pediatric patients with positive results of direct SARS-CoV-2 viral testing who are 45 years of age and older weighing at least 40 kg, and who are at high risk for progressing to severe COVID-19 and/or hospitalization.   2. The significant known and potential risks and benefits of casirivimab\imdevimab, and the extent to which such potential risks and benefits are unknown.   3. Information on available alternative treatments and the risks and benefits of those alternatives, including clinical trials.   4. Patients treated with casirivimab\imdevimab should continue to self-isolate and use infection control measures (e.g., wear mask, isolate, social distance, avoid sharing personal items, clean and disinfect "high touch" surfaces, and frequent handwashing) according to CDC guidelines.    5. The patient or parent/caregiver has the option to accept or refuse casirivimab\imdevimab .   After reviewing this information with  the patient, The patient agreed to proceed with receiving casirivimab\imdevimab infusion and will be provided a copy of the Fact sheet prior to receiving the infusion.Altha Harm, PhD, NP-C [phone] ([place])

## 2019-10-27 NOTE — Telephone Encounter (Signed)
I returned Mrs. Gade's call. She wanted the name and number of where the pt would get the infusions to help with his covid infection. Mrs. Kepner was given the contact information.  [phone].

## 2019-10-28 ENCOUNTER — Ambulatory Visit (HOSPITAL_COMMUNITY)
Admission: RE | Admit: 2019-10-28 | Discharge: 2019-10-28 | Disposition: A | Discharge status: Home / Self Care | Payer: Medicare Other | Source: Ambulatory Visit | Attending: Pulmonary Disease | Admitting: Pulmonary Disease

## 2019-10-28 DIAGNOSIS — U071 COVID-19: Secondary | ICD-10-CM | POA: Diagnosis present

## 2019-10-28 DIAGNOSIS — Z23 Encounter for immunization: Secondary | ICD-10-CM | POA: Diagnosis not present

## 2019-10-28 MED ORDER — SODIUM CHLORIDE 0.9 % IV SOLN
1200.0000 mg | Freq: Once | INTRAVENOUS | Status: AC
  Administered 2019-10-28: 1200 mg via INTRAVENOUS

## 2019-10-28 MED ORDER — SODIUM CHLORIDE 0.9 % IV SOLN: INTRAVENOUS | Status: DC | PRN

## 2019-10-28 MED ORDER — DIPHENHYDRAMINE HCL 50 MG/ML IJ SOLN: 50.0000 mg | Freq: Once | INTRAMUSCULAR | Status: DC | PRN

## 2019-10-28 MED ORDER — EPINEPHRINE 0.3 MG/0.3ML IJ SOAJ: 0.3000 mg | Freq: Once | INTRAMUSCULAR | Status: DC | PRN

## 2019-10-28 MED ORDER — FAMOTIDINE IN NACL 20-0.9 MG/50ML-% IV SOLN: 20.0000 mg | Freq: Once | INTRAVENOUS | Status: DC | PRN

## 2019-10-28 MED ORDER — METHYLPREDNISOLONE SODIUM SUCC 125 MG IJ SOLR: 125.0000 mg | Freq: Once | INTRAMUSCULAR | Status: DC | PRN

## 2019-10-28 MED ORDER — ALBUTEROL SULFATE HFA 108 (90 BASE) MCG/ACT IN AERS: 2.0000 | INHALATION_SPRAY | Freq: Once | RESPIRATORY_TRACT | Status: DC | PRN

## 2019-10-28 NOTE — Progress Notes (Signed)
  Diagnosis: COVID-19  Physician: Dr Asencion Noble   Procedure: Covid Infusion Clinic Med: casirivimab\imdevimab infusion - Provided patient with casirivimab\imdevimab fact sheet for patients, parents and caregivers prior to infusion.  Complications: Pt bp is 643/53. Pt is due to take his pm meds. Instructed to take meds and release to home per md.   Discharge: Discharged home   Shawn Fernandez 10/28/2019

## 2019-10-28 NOTE — Discharge Instructions (Signed)

## 2019-11-03 ENCOUNTER — Other Ambulatory Visit: Payer: Self-pay

## 2019-11-03 DIAGNOSIS — I6523 Occlusion and stenosis of bilateral carotid arteries: Secondary | ICD-10-CM

## 2019-11-23 ENCOUNTER — Ambulatory Visit: Payer: Medicare Other | Admitting: Internal Medicine

## 2019-12-01 ENCOUNTER — Encounter: Payer: Self-pay | Admitting: Internal Medicine

## 2019-12-01 ENCOUNTER — Ambulatory Visit (INDEPENDENT_AMBULATORY_CARE_PROVIDER_SITE_OTHER): Payer: Medicare Other | Admitting: Internal Medicine

## 2019-12-01 ENCOUNTER — Other Ambulatory Visit: Payer: Self-pay

## 2019-12-01 VITALS — BP 144/70 | HR 67 | Temp 98.0°F | Ht 67.8 in | Wt 146.0 lb

## 2019-12-01 DIAGNOSIS — I10 Essential (primary) hypertension: Secondary | ICD-10-CM

## 2019-12-01 DIAGNOSIS — E039 Hypothyroidism, unspecified: Secondary | ICD-10-CM

## 2019-12-01 DIAGNOSIS — Z8616 Personal history of COVID-19: Secondary | ICD-10-CM | POA: Diagnosis not present

## 2019-12-01 NOTE — Patient Instructions (Signed)
Hypothyroidism  Hypothyroidism is when the thyroid gland does not make enough of certain hormones (it is underactive). The thyroid gland is a small gland located in the lower front part of the neck, just in front of the windpipe (trachea). This gland makes hormones that help control how the body uses food for energy (metabolism) as well as how the heart and brain function. These hormones also play a role in keeping your bones strong. When the thyroid is underactive, it produces too little of the hormones thyroxine (T4) and triiodothyronine (T3). What are the causes? This condition may be caused by:  Hashimoto's disease. This is a disease in which the body's disease-fighting system (immune system) attacks the thyroid gland. This is the most common cause.  Viral infections.  Pregnancy.  Certain medicines.  Birth defects.  Past radiation treatments to the head or neck for cancer.  Past treatment with radioactive iodine.  Past exposure to radiation in the environment.  Past surgical removal of part or all of the thyroid.  Problems with a gland in the center of the brain (pituitary gland).  Lack of enough iodine in the diet. What increases the risk? You are more likely to develop this condition if:  You are male.  You have a family history of thyroid conditions.  You use a medicine called lithium.  You take medicines that affect the immune system (immunosuppressants). What are the signs or symptoms? Symptoms of this condition include:  Feeling as though you have no energy (lethargy).  Not being able to tolerate cold.  Weight gain that is not explained by a change in diet or exercise habits.  Lack of appetite.  Dry skin.  Coarse hair.  Menstrual irregularity.  Slowing of thought processes.  Constipation.  Sadness or depression. How is this diagnosed? This condition may be diagnosed based on:  Your symptoms, your medical history, and a physical exam.  Blood  tests. You may also have imaging tests, such as an ultrasound or MRI. How is this treated? This condition is treated with medicine that replaces the thyroid hormones that your body does not make. After you begin treatment, it may take several weeks for symptoms to go away. Follow these instructions at home:  Take over-the-counter and prescription medicines only as told by your health care provider.  If you start taking any new medicines, tell your health care provider.  Keep all follow-up visits as told by your health care provider. This is important. ? As your condition improves, your dosage of thyroid hormone medicine may change. ? You will need to have blood tests regularly so that your health care provider can monitor your condition. Contact a health care provider if:  Your symptoms do not get better with treatment.  You are taking thyroid replacement medicine and you: ? Sweat a lot. ? Have tremors. ? Feel anxious. ? Lose weight rapidly. ? Cannot tolerate heat. ? Have emotional swings. ? Have diarrhea. ? Feel weak. Get help right away if you have:  Chest pain.  An irregular heartbeat.  A rapid heartbeat.  Difficulty breathing. Summary  Hypothyroidism is when the thyroid gland does not make enough of certain hormones (it is underactive).  When the thyroid is underactive, it produces too little of the hormones thyroxine (T4) and triiodothyronine (T3).  The most common cause is Hashimoto's disease, a disease in which the body's disease-fighting system (immune system) attacks the thyroid gland. The condition can also be caused by viral infections, medicine, pregnancy, or past   radiation treatment to the head or neck.  Symptoms may include weight gain, dry skin, constipation, feeling as though you do not have energy, and not being able to tolerate cold.  This condition is treated with medicine to replace the thyroid hormones that your body does not make. This information  is not intended to replace advice given to you by your health care provider. Make sure you discuss any questions you have with your health care provider. Document Revised: 01/30/2017 Document Reviewed: 01/28/2017 Elsevier Patient Education  2020 [employer]  

## 2019-12-01 NOTE — Progress Notes (Signed)
I,Katawbba Wiggins,acting as a Education administrator for [name], MD.,have documented all relevant documentation on the behalf of [name], MD,as directed by  [name], MD while in the presence of [name], MD.  This visit occurred during the SARS-CoV-2 public health emergency.  Safety protocols were in place, including screening questions prior to the visit, additional usage of staff PPE, and extensive cleaning of exam room while observing appropriate contact time as indicated for disinfecting solutions.  Subjective:     Patient ID: Shawn Fernandez , male    DOB: Jun 30, 1945 , 74 y.o.   MRN: [id number]   Chief Complaint  Patient presents with  . Hypothyroidism    HPI  He is here today for f/u hypothyroidism. He reports compliance with meds.  He has been diagnosed with COVID-19 since his last visit. He did not require hospitalization. He did receive antibody treatment. He feels much better since receiving antibody therapy. He has no new concerns. He did receive COVID vaccine earlier this year, second dose was received in March 2021.   Hypertension This is a chronic problem. The current episode started more than 1 year ago. The problem is unchanged. The problem is controlled. Pertinent negatives include no blurred vision, chest pain, palpitations or shortness of breath. Risk factors for coronary artery disease include dyslipidemia, male gender and sedentary lifestyle. Identifiable causes of hypertension include a thyroid problem.     Past Medical History:  Diagnosis Date  . Blood clot in abdominal vein    treated at [facility]  . Carotid artery disease ([place])   . Dyslipidemia   . FHx: heart disease   . History of blood transfusion 8-9 yrs ago  . HTN (hypertension)   . Peripheral vascular disease ([place])      Family History  Problem Relation Age of Onset  . Heart disease Brother   . Heart disease Mother   . Cancer Father        ? type     Current Outpatient  Medications:  .  amLODipine (NORVASC) 10 MG tablet, TAKE 1 TABLET BY MOUTH EVERY DAY IN THE MORNING, Disp: 90 tablet, Rfl: 2 .  aspirin EC 81 MG tablet, Take 81 mg by mouth daily., Disp: , Rfl:  .  benzonatate (TESSALON) 200 MG capsule, Take 1 capsule (200 mg total) by mouth 2 (two) times daily as needed for cough., Disp: 20 capsule, Rfl: 0 .  Cholecalciferol (VITAMIN D3) 1000 units CAPS, Take 1 capsule by mouth daily., Disp: , Rfl:  .  cilostazol (PLETAL) 50 MG tablet, Take 50 mg by mouth every morning., Disp: , Rfl:  .  clopidogrel (PLAVIX) 75 MG tablet, TAKE 1 TABLET BY MOUTH EVERY DAY, Disp: 90 tablet, Rfl: 1 .  iron polysaccharides (NIFEREX) 150 MG capsule, Take 1 capsule (150 mg total) by mouth daily., Disp: 30 capsule, Rfl: 5 .  levothyroxine (SYNTHROID) 125 MCG tablet, Take 1 tablet (125 mcg total) by mouth daily. (Patient taking differently: Take 125 mcg by mouth daily. Monday - Saturday), Disp: 30 tablet, Rfl: 11 .  losartan (COZAAR) 100 MG tablet, TAKE 1 TABLET BY MOUTH EVERY DAY, Disp: 90 tablet, Rfl: 1 .  Multiple Vitamin (MULTIVITAMIN WITH MINERALS) TABS tablet, Take 1 tablet by mouth daily., Disp: , Rfl:  .  pantoprazole (PROTONIX) 40 MG tablet, TAKE 1 TABLET BY MOUTH EVERY DAY, Disp: 90 tablet, Rfl: 2 .  simvastatin (ZOCOR) 20 MG tablet, TAKE 1 TABLET BY MOUTH EVERY DAY IN THE  EVENING, Disp: 90 tablet, Rfl: 1   Allergies  Allergen Reactions  . Lisinopril Cough     Review of Systems  Constitutional: Negative.   Eyes: Negative for blurred vision.  Respiratory: Negative.  Negative for shortness of breath.   Cardiovascular: Negative.  Negative for chest pain and palpitations.  Gastrointestinal: Negative.   Psychiatric/Behavioral: Negative.   All other systems reviewed and are negative.    Today's Vitals   12/01/19 1408  BP: (!) 144/70  Pulse: 67  Temp: 98 F (36.7 C)  TempSrc: Oral  Weight: 146 lb (66.2 kg)  Height: 5' 7.8" (1.722 m)  PainSc: 0-No pain   Body mass  index is 22.33 kg/m.  Wt Readings from Last 3 Encounters:  12/01/19 146 lb (66.2 kg)  09/28/19 148 lb (67.1 kg)  09/28/19 148 lb (67.1 kg)   Objective:  Physical Exam Vitals and nursing note reviewed.  Constitutional:      Appearance: Normal appearance. He is obese.  HENT:     Head: Normocephalic and atraumatic.  Cardiovascular:     Rate and Rhythm: Normal rate and regular rhythm.     Heart sounds: Normal heart sounds.  Pulmonary:     Breath sounds: Normal breath sounds.  Skin:    General: Skin is warm.  Neurological:     General: No focal deficit present.     Mental Status: He is alert and oriented to person, place, and time.         Assessment And Plan:     1. Acquired hypothyroidism Comments: I will check thyroid panel and adjust meds as needed.  - TSH  2. Essential hypertension, benign Comments: Uncontrolled. He will continue with current meds. He is encouraged to avoid adding salt to his foods. Previous renal labs reviewed.   3. Personal history of covid-19   I personally spent 25 minutes face-to-face and non-face-to-face in the care of this patient, which includes all pre-, intra-, and post visit time on the date of service.  Patient was given opportunity to ask questions. Patient verbalized understanding of the plan and was able to repeat key elements of the plan. All questions were answered to their satisfaction.  [name], MD   I, [name], MD, have reviewed all documentation for this visit. The documentation on 12/18/19 for the exam, diagnosis, procedures, and orders are all accurate and complete.  THE PATIENT IS ENCOURAGED TO PRACTICE SOCIAL DISTANCING DUE TO THE COVID-19 PANDEMIC.

## 2019-12-02 LAB — TSH: TSH: 7.72 u[IU]/mL — ABNORMAL HIGH (ref 0.450–4.500)

## 2020-01-09 ENCOUNTER — Encounter: Payer: Self-pay | Admitting: Internal Medicine

## 2020-01-09 ENCOUNTER — Ambulatory Visit (INDEPENDENT_AMBULATORY_CARE_PROVIDER_SITE_OTHER): Payer: Medicare Other | Admitting: Internal Medicine

## 2020-01-09 ENCOUNTER — Other Ambulatory Visit: Payer: Self-pay

## 2020-01-09 VITALS — BP 132/80 | HR 66 | Temp 98.2°F | Ht 67.8 in | Wt 148.6 lb

## 2020-01-09 DIAGNOSIS — Z0000 Encounter for general adult medical examination without abnormal findings: Secondary | ICD-10-CM

## 2020-01-09 DIAGNOSIS — I10 Essential (primary) hypertension: Secondary | ICD-10-CM

## 2020-01-09 DIAGNOSIS — I270 Primary pulmonary hypertension: Secondary | ICD-10-CM

## 2020-01-09 DIAGNOSIS — I714 Abdominal aortic aneurysm, without rupture, unspecified: Secondary | ICD-10-CM

## 2020-01-09 DIAGNOSIS — E039 Hypothyroidism, unspecified: Secondary | ICD-10-CM | POA: Diagnosis not present

## 2020-01-09 LAB — POCT URINALYSIS DIPSTICK
Bilirubin, UA: NEGATIVE
Blood, UA: NEGATIVE
Glucose, UA: NEGATIVE
Ketones, UA: NEGATIVE
Leukocytes, UA: NEGATIVE
Nitrite, UA: NEGATIVE
Protein, UA: NEGATIVE
Spec Grav, UA: 1.02 (ref 1.010–1.025)
Urobilinogen, UA: 1 E.U./dL
pH, UA: 7 (ref 5.0–8.0)

## 2020-01-09 LAB — POCT UA - MICROALBUMIN
Albumin/Creatinine Ratio, Urine, POC: 30
Creatinine, POC: 300 mg/dL
Microalbumin Ur, POC: 10 mg/L

## 2020-01-09 NOTE — Addendum Note (Signed)
Addended by: [name] on: 01/09/2020 06:13 PM   Modules accepted: Orders

## 2020-01-09 NOTE — Progress Notes (Signed)
This visit occurred during the SARS-CoV-2 public health emergency.  Safety protocols were in place, including screening questions prior to the visit, additional usage of staff PPE, and extensive cleaning of exam room while observing appropriate contact time as indicated for disinfecting solutions.  Subjective:     Patient ID: Shawn Fernandez , male    DOB: December 27, 1945 , 74 y.o.   MRN: [id number]   Chief Complaint  Patient presents with  . Annual Exam  . Hypertension    HPI  He is here today for a full physical exam. He has no specific concerns or complaints at this time. He reports compliance with meds.  He would like to discuss a skin lesion on his scalp.   Hypertension This is a chronic problem. The current episode started more than 1 year ago. The problem has been gradually improving since onset. The problem is controlled. Pertinent negatives include no blurred vision, chest pain, palpitations or shortness of breath. Risk factors for coronary artery disease include smoking/tobacco exposure, sedentary lifestyle and male gender. The current treatment provides moderate improvement. Compliance problems include exercise.      Past Medical History:  Diagnosis Date  . Blood clot in abdominal vein    treated at [facility]  . Carotid artery disease ([place])   . Dyslipidemia   . FHx: heart disease   . History of blood transfusion 8-9 yrs ago  . HTN (hypertension)   . Peripheral vascular disease ([place])      Family History  Problem Relation Age of Onset  . Heart disease Brother   . Heart disease Mother   . Cancer Father        ? type     Current Outpatient Medications:  .  amLODipine (NORVASC) 10 MG tablet, TAKE 1 TABLET BY MOUTH EVERY DAY IN THE MORNING, Disp: 90 tablet, Rfl: 2 .  aspirin EC 81 MG tablet, Take 81 mg by mouth daily., Disp: , Rfl:  .  benzonatate (TESSALON) 200 MG capsule, Take 1 capsule (200 mg total) by mouth 2 (two) times daily as needed for cough., Disp: 20 capsule,  Rfl: 0 .  Cholecalciferol (VITAMIN D3) 1000 units CAPS, Take 1 capsule by mouth daily., Disp: , Rfl:  .  cilostazol (PLETAL) 50 MG tablet, Take 50 mg by mouth every morning., Disp: , Rfl:  .  clopidogrel (PLAVIX) 75 MG tablet, TAKE 1 TABLET BY MOUTH EVERY DAY, Disp: 90 tablet, Rfl: 1 .  iron polysaccharides (NIFEREX) 150 MG capsule, Take 1 capsule (150 mg total) by mouth daily., Disp: 30 capsule, Rfl: 5 .  levothyroxine (SYNTHROID) 125 MCG tablet, Take 1 tablet (125 mcg total) by mouth daily. (Patient taking differently: Take 125 mcg by mouth daily. Monday - Saturday), Disp: 30 tablet, Rfl: 11 .  losartan (COZAAR) 100 MG tablet, TAKE 1 TABLET BY MOUTH EVERY DAY, Disp: 90 tablet, Rfl: 1 .  Multiple Vitamin (MULTIVITAMIN WITH MINERALS) TABS tablet, Take 1 tablet by mouth daily., Disp: , Rfl:  .  pantoprazole (PROTONIX) 40 MG tablet, TAKE 1 TABLET BY MOUTH EVERY DAY, Disp: 90 tablet, Rfl: 2 .  simvastatin (ZOCOR) 20 MG tablet, TAKE 1 TABLET BY MOUTH EVERY DAY IN THE EVENING, Disp: 90 tablet, Rfl: 1   Allergies  Allergen Reactions  . Lisinopril Cough     Men's preventive visit. Patient Health Questionnaire (PHQ-2) is    Clinical Support from 09/28/2019 in [facility]  PHQ-2 Total Score 0    . Patient is on  a healthy diet. Marital status: Married. Relevant history for alcohol use is:  Social History   Substance and Sexual Activity  Alcohol Use Not Currently   Comment: only occ  . Relevant history for tobacco use is:  Social History   Tobacco Use  Smoking Status Former Smoker  . Packs/day: 0.75  . Years: 20.00  . Pack years: 15.00  . Types: Cigarettes  . Quit date: 07/01/2009  . Years since quitting: 10.5  Smokeless Tobacco Never Used  .   Review of Systems  Constitutional: Negative.   HENT: Negative.   Eyes: Negative.  Negative for blurred vision.  Respiratory: Negative.  Negative for shortness of breath.   Cardiovascular: Negative.  Negative for chest pain  and palpitations.  Gastrointestinal: Negative.   Endocrine: Negative.   Genitourinary: Negative.   Musculoskeletal: Negative.   Skin: Negative.   Allergic/Immunologic: Negative.   Neurological: Negative.   Hematological: Negative.   Psychiatric/Behavioral: Negative.      Today's Vitals   01/09/20 1409  BP: 132/80  Pulse: 66  Temp: 98.2 F (36.8 C)  TempSrc: Oral  Weight: 148 lb 9.6 oz (67.4 kg)  Height: 5' 7.8" (1.722 m)   Body mass index is 22.73 kg/m.   Objective:  Physical Exam Vitals and nursing note reviewed.  Constitutional:      Appearance: Normal appearance.  HENT:     Head: Normocephalic and atraumatic.     Right Ear: Tympanic membrane, ear canal and external ear normal.     Left Ear: Tympanic membrane, ear canal and external ear normal.     Nose:     Comments: Deferred, masked    Mouth/Throat:     Comments: Deferred, masked Eyes:     Extraocular Movements: Extraocular movements intact.     Conjunctiva/sclera: Conjunctivae normal.     Pupils: Pupils are equal, round, and reactive to light.  Cardiovascular:     Rate and Rhythm: Normal rate and regular rhythm.     Pulses: Normal pulses.     Heart sounds: Normal heart sounds.  Pulmonary:     Effort: Pulmonary effort is normal.     Breath sounds: Normal breath sounds.  Chest:     Breasts:        Right: Normal. No swelling, bleeding, inverted nipple, mass or nipple discharge.        Left: Normal. No swelling, bleeding, inverted nipple, mass or nipple discharge.  Abdominal:     General: Abdomen is flat. Bowel sounds are normal.     Palpations: Abdomen is soft.  Genitourinary:    Comments: Deferred Musculoskeletal:        General: Normal range of motion.     Cervical back: Normal range of motion and neck supple.     Comments: Joint abnormality LUE, deformity of forearm  Skin:    General: Skin is warm.  Neurological:     General: No focal deficit present.     Mental Status: He is alert.   Psychiatric:        Mood and Affect: Mood normal.        Behavior: Behavior normal.         Assessment And Plan:    1. Routine general medical examination at health care facility Comments: A full exam was performed. DRE deferred, he is followed by Urology. I will request his 2021 records from [facility]. PATIENT IS ADVISED TO GET 30-45 MINUTES REGULAR EXERCISE NO LESS THAN FOUR TO FIVE DAYS PER WEEK -  BOTH WEIGHTBEARING EXERCISES AND AEROBIC ARE RECOMMENDED.  PATIENT IS ADVISED TO FOLLOW A HEALTHY DIET WITH AT LEAST SIX FRUITS/VEGGIES PER DAY, DECREASE INTAKE OF RED MEAT, AND TO INCREASE FISH INTAKE TO TWO DAYS PER WEEK.  MEATS/FISH SHOULD NOT BE FRIED, BAKED OR BROILED IS PREFERABLE.  I SUGGEST WEARING SPF 50 SUNSCREEN ON EXPOSED PARTS AND ESPECIALLY WHEN IN THE DIRECT SUNLIGHT FOR AN EXTENDED PERIOD OF TIME.  PLEASE AVOID FAST FOOD RESTAURANTS AND INCREASE YOUR WATER INTAKE.  - CBC - CMP14+EGFR - Lipid panel  2. Essential hypertension, benign Comments: Chronic, fair control. EKG performed, SB w/o acute changes. He will continue with current meds. Encouraged to avoid adding salt to his foods.  He will rto in six months for re-evaluation.  - POCT Urinalysis Dipstick (81002) - POCT UA - Microalbumin - EKG 12-Lead - TSH - T4, Free  3. Acquired hypothyroidism Comments: I will check thyroid panel and adjust meds as needed.  He will rto in six months for re-evaluation.      Patient was given opportunity to ask questions. Patient verbalized understanding of the plan and was able to repeat key elements of the plan. All questions were answered to their satisfaction.   [name], MD   I, [name], MD, have reviewed all documentation for this visit. The documentation on 01/09/20 for the exam, diagnosis, procedures, and orders are all accurate and complete.  THE PATIENT IS ENCOURAGED TO PRACTICE SOCIAL DISTANCING DUE TO THE COVID-19 PANDEMIC.

## 2020-01-09 NOTE — Patient Instructions (Signed)

## 2020-01-10 ENCOUNTER — Telehealth: Payer: Self-pay

## 2020-01-10 LAB — CBC
Hematocrit: 36.7 % — ABNORMAL LOW (ref 37.5–51.0)
Hemoglobin: 11.8 g/dL — ABNORMAL LOW (ref 13.0–17.7)
MCH: 28.7 pg (ref 26.6–33.0)
MCHC: 32.2 g/dL (ref 31.5–35.7)
MCV: 89 fL (ref 79–97)
Platelets: 229 10*3/uL (ref 150–450)
RBC: 4.11 x10E6/uL — ABNORMAL LOW (ref 4.14–5.80)
RDW: 11.8 % (ref 11.6–15.4)
WBC: 5.7 10*3/uL (ref 3.4–10.8)

## 2020-01-10 LAB — CMP14+EGFR
ALT: 11 IU/L (ref 0–44)
AST: 23 IU/L (ref 0–40)
Albumin/Globulin Ratio: 1.7 (ref 1.2–2.2)
Albumin: 4.4 g/dL (ref 3.7–4.7)
Alkaline Phosphatase: 104 IU/L (ref 44–121)
BUN/Creatinine Ratio: 11 (ref 10–24)
BUN: 12 mg/dL (ref 8–27)
Bilirubin Total: 0.5 mg/dL (ref 0.0–1.2)
CO2: 22 mmol/L (ref 20–29)
Calcium: 9.2 mg/dL (ref 8.6–10.2)
Chloride: 106 mmol/L (ref 96–106)
Creatinine, Ser: 1.08 mg/dL (ref 0.76–1.27)
GFR calc Af Amer: 78 mL/min/{1.73_m2} (ref >59)
GFR calc non Af Amer: 67 mL/min/{1.73_m2} (ref >59)
Globulin, Total: 2.6 g/dL (ref 1.5–4.5)
Glucose: 81 mg/dL (ref 65–99)
Potassium: 4 mmol/L (ref 3.5–5.2)
Sodium: 142 mmol/L (ref 134–144)
Total Protein: 7 g/dL (ref 6.0–8.5)

## 2020-01-10 LAB — TSH: TSH: 8.14 u[IU]/mL — ABNORMAL HIGH (ref 0.450–4.500)

## 2020-01-10 LAB — LIPID PANEL
Chol/HDL Ratio: 2.4 ratio (ref 0.0–5.0)
Cholesterol, Total: 177 mg/dL (ref 100–199)
HDL: 73 mg/dL (ref >39)
LDL Chol Calc (NIH): 94 mg/dL (ref 0–99)
Triglycerides: 51 mg/dL (ref 0–149)
VLDL Cholesterol Cal: 10 mg/dL (ref 5–40)

## 2020-01-10 LAB — T4, FREE: Free T4: 1.11 ng/dL (ref 0.82–1.77)

## 2020-01-10 NOTE — Telephone Encounter (Signed)
The pt was told that Dr. Baird Cancer is adding on PSA test, [facility] sent a reply that the pt isn't a pt there. The pt said that he has a urologist that he knows is on [address] and that he will check for the name when he gets home.

## 2020-01-11 ENCOUNTER — Telehealth: Payer: Self-pay

## 2020-01-11 NOTE — Telephone Encounter (Signed)
The pt called back and left a message that he is a patient of [facility].

## 2020-01-12 LAB — SPECIMEN STATUS REPORT

## 2020-01-12 LAB — PSA: Prostate Specific Ag, Serum: 6.7 ng/mL — ABNORMAL HIGH (ref 0.0–4.0)

## 2020-01-13 ENCOUNTER — Ambulatory Visit: Payer: Medicare Other

## 2020-01-13 ENCOUNTER — Other Ambulatory Visit: Payer: Self-pay

## 2020-01-13 ENCOUNTER — Ambulatory Visit (HOSPITAL_COMMUNITY)
Admission: RE | Admit: 2020-01-13 | Discharge: 2020-01-13 | Disposition: A | Discharge status: Home / Self Care | Payer: Medicare Other | Source: Ambulatory Visit | Attending: Internal Medicine | Admitting: Internal Medicine

## 2020-01-13 DIAGNOSIS — I714 Abdominal aortic aneurysm, without rupture, unspecified: Secondary | ICD-10-CM

## 2020-01-16 ENCOUNTER — Other Ambulatory Visit: Payer: Medicare Other

## 2020-01-16 ENCOUNTER — Other Ambulatory Visit: Payer: Self-pay

## 2020-01-16 ENCOUNTER — Telehealth: Payer: Self-pay

## 2020-01-16 DIAGNOSIS — E039 Hypothyroidism, unspecified: Secondary | ICD-10-CM

## 2020-01-16 NOTE — Telephone Encounter (Signed)
I called and spoke to a rep named shawn she was told that the pt had a tsh done last week and so the order for today needed to be cancelled.  She said that it maybe too late because it is after 5 and the lab is closed for today.  She is faxing over a form to have filled out and faxed back to cancel the test.

## 2020-01-17 ENCOUNTER — Other Ambulatory Visit: Payer: Self-pay

## 2020-01-17 DIAGNOSIS — R972 Elevated prostate specific antigen [PSA]: Secondary | ICD-10-CM

## 2020-01-18 LAB — TSH

## 2020-02-02 ENCOUNTER — Telehealth: Payer: Self-pay

## 2020-02-02 ENCOUNTER — Other Ambulatory Visit (INDEPENDENT_AMBULATORY_CARE_PROVIDER_SITE_OTHER): Payer: Medicare Other

## 2020-02-02 DIAGNOSIS — D649 Anemia, unspecified: Secondary | ICD-10-CM

## 2020-02-02 LAB — HEMOCCULT GUIAC POC 1CARD (OFFICE)
Card #2 Fecal Occult Blod, POC: POSITIVE
Card #3 Fecal Occult Blood, POC: POSITIVE
Fecal Occult Blood, POC: POSITIVE — AB

## 2020-02-02 NOTE — Telephone Encounter (Signed)
The pt was notified that his stool cards were positive for blood and that Dr Baird Cancer said that pt needed to see his GI doctor.  The pt's results was faxed to Dr. Paulita Fujita.

## 2020-02-08 ENCOUNTER — Other Ambulatory Visit: Payer: Self-pay

## 2020-02-08 ENCOUNTER — Inpatient Hospital Stay: Payer: Medicare Other | Attending: Hematology

## 2020-02-08 ENCOUNTER — Other Ambulatory Visit: Payer: Self-pay | Admitting: Internal Medicine

## 2020-02-08 ENCOUNTER — Inpatient Hospital Stay (HOSPITAL_BASED_OUTPATIENT_CLINIC_OR_DEPARTMENT_OTHER): Payer: Medicare Other | Admitting: Hematology

## 2020-02-08 VITALS — BP 187/71 | HR 59 | Temp 97.7°F | Resp 16 | Ht 67.8 in | Wt 144.8 lb

## 2020-02-08 DIAGNOSIS — I10 Essential (primary) hypertension: Secondary | ICD-10-CM | POA: Diagnosis not present

## 2020-02-08 DIAGNOSIS — I714 Abdominal aortic aneurysm, without rupture: Secondary | ICD-10-CM | POA: Insufficient documentation

## 2020-02-08 DIAGNOSIS — Z79899 Other long term (current) drug therapy: Secondary | ICD-10-CM | POA: Diagnosis not present

## 2020-02-08 DIAGNOSIS — Z87891 Personal history of nicotine dependence: Secondary | ICD-10-CM | POA: Diagnosis not present

## 2020-02-08 DIAGNOSIS — I739 Peripheral vascular disease, unspecified: Secondary | ICD-10-CM | POA: Insufficient documentation

## 2020-02-08 DIAGNOSIS — E0590 Thyrotoxicosis, unspecified without thyrotoxic crisis or storm: Secondary | ICD-10-CM | POA: Diagnosis not present

## 2020-02-08 DIAGNOSIS — K449 Diaphragmatic hernia without obstruction or gangrene: Secondary | ICD-10-CM | POA: Diagnosis not present

## 2020-02-08 DIAGNOSIS — K2980 Duodenitis without bleeding: Secondary | ICD-10-CM | POA: Insufficient documentation

## 2020-02-08 DIAGNOSIS — D500 Iron deficiency anemia secondary to blood loss (chronic): Secondary | ICD-10-CM | POA: Diagnosis not present

## 2020-02-08 DIAGNOSIS — D509 Iron deficiency anemia, unspecified: Secondary | ICD-10-CM | POA: Diagnosis not present

## 2020-02-08 DIAGNOSIS — E785 Hyperlipidemia, unspecified: Secondary | ICD-10-CM | POA: Diagnosis not present

## 2020-02-08 LAB — FERRITIN: Ferritin: 24 ng/mL (ref 24–336)

## 2020-02-08 LAB — CBC WITH DIFFERENTIAL/PLATELET
Abs Immature Granulocytes: 0 10*3/uL (ref 0.00–0.07)
Basophils Absolute: 0.1 10*3/uL (ref 0.0–0.1)
Basophils Relative: 2 %
Eosinophils Absolute: 0.2 10*3/uL (ref 0.0–0.5)
Eosinophils Relative: 4 %
HCT: 43.1 % (ref 39.0–52.0)
Hemoglobin: 13.4 g/dL (ref 13.0–17.0)
Immature Granulocytes: 0 %
Lymphocytes Relative: 33 %
Lymphs Abs: 1.5 10*3/uL (ref 0.7–4.0)
MCH: 28.3 pg (ref 26.0–34.0)
MCHC: 31.1 g/dL (ref 30.0–36.0)
MCV: 91.1 fL (ref 80.0–100.0)
Monocytes Absolute: 0.5 10*3/uL (ref 0.1–1.0)
Monocytes Relative: 10 %
Neutro Abs: 2.3 10*3/uL (ref 1.7–7.7)
Neutrophils Relative %: 51 %
Platelets: 211 10*3/uL (ref 150–400)
RBC: 4.73 MIL/uL (ref 4.22–5.81)
RDW: 13.1 % (ref 11.5–15.5)
WBC: 4.6 10*3/uL (ref 4.0–10.5)
nRBC: 0 % (ref 0.0–0.2)

## 2020-02-08 LAB — CMP (CANCER CENTER ONLY)
ALT: 6 U/L (ref 0–44)
AST: 22 U/L (ref 15–41)
Albumin: 4 g/dL (ref 3.5–5.0)
Alkaline Phosphatase: 104 U/L (ref 38–126)
Anion gap: 8 (ref 5–15)
BUN: 11 mg/dL (ref 8–23)
CO2: 26 mmol/L (ref 22–32)
Calcium: 9.8 mg/dL (ref 8.9–10.3)
Chloride: 109 mmol/L (ref 98–111)
Creatinine: 1.07 mg/dL (ref 0.61–1.24)
GFR, Estimated: >60 mL/min (ref >60)
Glucose, Bld: 89 mg/dL (ref 70–99)
Potassium: 4.1 mmol/L (ref 3.5–5.1)
Sodium: 143 mmol/L (ref 135–145)
Total Bilirubin: 0.7 mg/dL (ref 0.3–1.2)
Total Protein: 8.5 g/dL — ABNORMAL HIGH (ref 6.5–8.1)

## 2020-02-08 LAB — IRON AND TIBC
Iron: 407 ug/dL — ABNORMAL HIGH (ref 42–163)
Saturation Ratios: 100 % — ABNORMAL HIGH (ref 20–55)
TIBC: 405 ug/dL (ref 202–409)
UIBC: UNDETERMINED ug/dL (ref 117–376)

## 2020-02-08 NOTE — Progress Notes (Signed)
HEMATOLOGY/ONCOLOGY CLINIC NOTE  Date of Service: .03/21/2016   Patient Care Team: [name], MD as PCP - General (Internal Medicine) Lorretta Harp, MD as PCP - Cardiology (Cardiology) Lorretta Harp, MD as Consulting Physician (Cardiology)   Endocrinologist: Dr. Jacelyn Pi  CHIEF COMPLAINTS/PURPOSE OF CONSULTATION:  F/u for IDA  DIAGNOSIS  Microcytic hypochromic Anemia with MCV 79.4 due to Iron deficiency. Ferritin 27, Iron sat too low to calculate. No overt evidence of bleeding. Patient notes recent fecal occult blood neg x 1. He has had significant iron deficiency anemia in the past in 2011 with a ferritin level of 9 and required a blood transfusion. EGD showed duodenitis and hiatal hernia. Patient remains at high risk of GI bleeding since he is on ASA+ plavix for his endovascular stent graft for repaired AAA and is on Cilostazole for PAD. Some element of anemia also appears to be temporally related to methimazole use (though agranulocytosis is the usual concern and isolated anemia is less usual) The hyperthyroidism itself can also contribute to the anemia until corrected. SPEP with no M spike No evidence of hemolysis B12/folate WNL  Current treatment -Iron polysaccharide 150mg  po daily for iron maintenance replacement -previously IV feraheme. Continue prn IV feraheme.  HISTORY OF PRESENTING ILLNESS: plz see initial consultation for details on initial presentation   INTERVAL HISTORY: Shawn Fernandez is a 74 y.o. male here for evaluation and management of anemia. The patient's last visit with [place] was on 08/09/2019. The pt reports that he is doing well overall.  The pt reports that he has felt well in the interim. Pt continues Aspirin, Plavix, and Cilostazol under the recommendation of his PCP. He continues PO Iron without any GI symptoms. He notes dark stools but denies any blood in his stools. Pt has received his COVID19 booster. He recently had an  abdominal US which was benign.  Lab results today (02/08/20) of CBC w/diff and CMP is as follows: all values are WNL except for Total Protein at 8.5. 02/08/2020 Iron Panel is follows: Iron at 407, TIBC at 405, Sat Ratios at 100 02/08/2020 Ferritin at 24  On review of systems, pt reports dark stools and denies constipation, diarrhea, bloody stools and any other symptoms.    MEDICAL HISTORY:   Past Medical History:  Diagnosis Date  . Blood clot in abdominal vein    treated at [facility]  . Carotid artery disease ([place])   . Dyslipidemia   . FHx: heart disease   . History of blood transfusion 8-9 yrs ago  . HTN (hypertension)   . Peripheral vascular disease ([place])    Hyperthyroidism -being followed by Dr. Chalmers Cater - was previously on methimazole for 6-8 months and has been off for a few weeks now. He was treated with radioactive iodine about one week ago.  He had presented with progressive weight loss from 2 years prior to diagnosis.  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  Previous history of iron deficiency anemia ferritin was 9 about 5 years ago in 2011  SURGICAL HISTORY: Past Surgical History:  Procedure Laterality Date  . ANKLE SURGERY Right yrs ago  . arm surgery Left yrs ago   2 rods inserted, 1 rod later removed  . COLONOSCOPY WITH PROPOFOL N/A 09/16/2013   Procedure: COLONOSCOPY WITH PROPOFOL;  Surgeon: Beryle Beams, MD;  Location: [facility] ENDOSCOPY;  Service: Endoscopy;  Laterality: N/A;  . LOWER EXTREMITY ANGIOGRAM N/A 07/24/2011  Procedure: LOWER EXTREMITY ANGIOGRAM;  Surgeon: Lorretta Harp, MD;  Location: [facility] CATH LAB;  Service: Cardiovascular;  Laterality: N/A;  . stent in abdominal vein  4-5 yrs ago  . US ECHOCARDIOGRAPHY  07-20-2007   EF 55-60%    SOCIAL HISTORY: Social History   Socioeconomic History  . Marital status: Married    Spouse name: Not on file  . Number of children: Not on file  . Years of  education: Not on file  . Highest education level: Not on file  Occupational History  . Occupation: retired  Tobacco Use  . Smoking status: Former Smoker    Packs/day: 0.75    Years: 20.00    Pack years: 15.00    Types: Cigarettes    Quit date: 07/01/2009    Years since quitting: 10.6  . Smokeless tobacco: Never Used  Vaping Use  . Vaping Use: Never used  Substance and Sexual Activity  . Alcohol use: Not Currently    Comment: only occ  . Drug use: No  . Sexual activity: Yes  Other Topics Concern  . Not on file  Social History Narrative  . Not on file   Social Determinants of Health   Financial Resource Strain: Low Risk   . Difficulty of Paying Living Expenses: Not hard at all  Food Insecurity: No Food Insecurity  . Worried About Charity fundraiser in the Last Year: Never true  . Ran Out of Food in the Last Year: Never true  Transportation Needs: No Transportation Needs  . Lack of Transportation (Medical): No  . Lack of Transportation (Non-Medical): No  Physical Activity: Inactive  . Days of Exercise per Week: 0 days  . Minutes of Exercise per Session: 0 min  Stress: No Stress Concern Present  . Feeling of Stress : Not at all  Social Connections:   . Frequency of Communication with Friends and Family: Not on file  . Frequency of Social Gatherings with Friends and Family: Not on file  . Attends Religious Services: Not on file  . Active Member of Clubs or Organizations: Not on file  . Attends Archivist Meetings: Not on file  . Marital Status: Not on file  Intimate Partner Violence:   . Fear of Current or Ex-Partner: Not on file  . Emotionally Abused: Not on file  . Physically Abused: Not on file  . Sexually Abused: Not on file    FAMILY HISTORY: Family History  Problem Relation Age of Onset  . Heart disease Brother   . Heart disease Mother   . Cancer Father        ? type    ALLERGIES:  is allergic to lisinopril.  MEDICATIONS:  Current  Outpatient Medications  Medication Sig Dispense Refill  . amLODipine (NORVASC) 10 MG tablet TAKE 1 TABLET BY MOUTH EVERY DAY IN THE MORNING 90 tablet 2  . aspirin EC 81 MG tablet Take 81 mg by mouth daily.    . benzonatate (TESSALON) 200 MG capsule Take 1 capsule (200 mg total) by mouth 2 (two) times daily as needed for cough. 20 capsule 0  . Cholecalciferol (VITAMIN D3) 1000 units CAPS Take 1 capsule by mouth daily.    . cilostazol (PLETAL) 50 MG tablet Take 50 mg by mouth every morning.    . clopidogrel (PLAVIX) 75 MG tablet TAKE 1 TABLET BY MOUTH EVERY DAY 90 tablet 1  . iron polysaccharides (NIFEREX) 150 MG capsule Take 1 capsule (150 mg total) by mouth  daily. 30 capsule 5  . levothyroxine (SYNTHROID) 125 MCG tablet Take 1 tablet (125 mcg total) by mouth daily. (Patient taking differently: Take 125 mcg by mouth daily. Monday - Saturday) 30 tablet 11  . losartan (COZAAR) 100 MG tablet TAKE 1 TABLET BY MOUTH EVERY DAY 90 tablet 1  . Multiple Vitamin (MULTIVITAMIN WITH MINERALS) TABS tablet Take 1 tablet by mouth daily.    . pantoprazole (PROTONIX) 40 MG tablet TAKE 1 TABLET BY MOUTH EVERY DAY 90 tablet 2  . simvastatin (ZOCOR) 20 MG tablet TAKE 1 TABLET BY MOUTH EVERY DAY IN THE EVENING 90 tablet 1   No current facility-administered medications for this visit.    REVIEW OF SYSTEMS:   A 10+ POINT REVIEW OF SYSTEMS WAS OBTAINED including neurology, dermatology, psychiatry, cardiac, respiratory, lymph, extremities, GI, GU, Musculoskeletal, constitutional, breasts, reproductive, HEENT.  All pertinent positives are noted in the HPI.  All others are negative.   PHYSICAL EXAMINATION: ECOG FS:2 - Symptomatic, <50% confined to bed  Vitals:   02/08/20 1409  BP: (!) 187/71  Pulse: (!) 59  Resp: 16  Temp: 97.7 F (36.5 C)  SpO2: 100%   Wt Readings from Last 3 Encounters:  02/08/20 144 lb 12.8 oz (65.7 kg)  01/09/20 148 lb 9.6 oz (67.4 kg)  12/01/19 146 lb (66.2 kg)   Body mass index is  22.15 kg/m.    GENERAL:alert, in no acute distress and comfortable SKIN: no acute rashes, no significant lesions EYES: conjunctiva are pink and non-injected, sclera anicteric OROPHARYNX: MMM, no exudates, no oropharyngeal erythema or ulceration NECK: supple, no JVD LYMPH:  no palpable lymphadenopathy in the cervical, axillary or inguinal regions LUNGS: clear to auscultation b/l with normal respiratory effort HEART: regular rate & rhythm ABDOMEN:  normoactive bowel sounds , non tender, not distended. No palpable hepatosplenomegaly.  Extremity: no pedal edema PSYCH: alert & oriented x 3 with fluent speech NEURO: no focal motor/sensory deficits  LABORATORY DATA:  I have reviewed the data as listed  . CBC Latest Ref Rng & Units 02/08/2020 01/09/2020 08/09/2019  WBC 4.0 - 10.5 K/uL 4.6 5.7 5.7  Hemoglobin 13.0 - 17.0 g/dL 13.4 11.8(L) 12.3(L)  Hematocrit 39 - 52 % 43.1 36.7(L) 39.9  Platelets 150 - 400 K/uL 211 229 177   . CBC    Component Value Date/Time   WBC 4.6 02/08/2020 1336   RBC 4.73 02/08/2020 1336   HGB 13.4 02/08/2020 1336   HGB 11.8 (L) 01/09/2020 1453   HGB 13.9 03/21/2016 0919   HCT 43.1 02/08/2020 1336   HCT 36.7 (L) 01/09/2020 1453   HCT 43.1 03/21/2016 0919   PLT 211 02/08/2020 1336   PLT 229 01/09/2020 1453   MCV 91.1 02/08/2020 1336   MCV 89 01/09/2020 1453   MCV 89.2 03/21/2016 0919   MCH 28.3 02/08/2020 1336   MCHC 31.1 02/08/2020 1336   RDW 13.1 02/08/2020 1336   RDW 11.8 01/09/2020 1453   RDW 13.1 03/21/2016 0919   LYMPHSABS 1.5 02/08/2020 1336   LYMPHSABS 2.1 11/03/2018 1626   LYMPHSABS 2.5 03/21/2016 0919   MONOABS 0.5 02/08/2020 1336   MONOABS 0.8 03/21/2016 0919   EOSABS 0.2 02/08/2020 1336   EOSABS 0.2 11/03/2018 1626   BASOSABS 0.1 02/08/2020 1336   BASOSABS 0.1 11/03/2018 1626   BASOSABS 0.1 03/21/2016 0919    CMP Latest Ref Rng & Units 02/08/2020 01/09/2020 08/09/2019  Glucose 70 - 99 mg/dL 89 81 87  BUN 8 - 23 mg/dL 11 12  12   Creatinine 0.61 - 1.24 mg/dL 1.07 1.08 0.97  Sodium 135 - 145 mmol/L 143 142 145  Potassium 3.5 - 5.1 mmol/L 4.1 4.0 4.0  Chloride 98 - 111 mmol/L 109 106 109  CO2 22 - 32 mmol/L 26 22 26   Calcium 8.9 - 10.3 mg/dL 9.8 9.2 9.8  Total Protein 6.5 - 8.1 g/dL 8.5(H) 7.0 8.2(H)  Total Bilirubin 0.3 - 1.2 mg/dL 0.7 0.5 0.6  Alkaline Phos 38 - 126 U/L 104 104 122  AST 15 - 41 U/L 22 23 22   ALT 0 - 44 U/L 6 11 8    . Lab Results  Component Value Date   IRON 407 (H) 02/08/2020   TIBC 405 02/08/2020   IRONPCTSAT 100 (H) 02/08/2020   (Iron and TIBC)  Lab Results  Component Value Date   FERRITIN 24 02/08/2020   . Lab Results  Component Value Date   IRON 407 (H) 02/08/2020   TIBC 405 02/08/2020   IRONPCTSAT 100 (H) 02/08/2020   (Iron and TIBC)  Lab Results  Component Value Date   FERRITIN 24 02/08/2020    RADIOGRAPHIC STUDIES: I have personally reviewed the radiological images as listed and agreed with the findings in the report. VAS [place] AAA DUPLEX  Result Date: 01/13/2020 ABDOMINAL AORTA STUDY Risk Factors: Hypertension. Limitations: Air/bowel gas.  Comparison Study: None Performing Technologist: Ivan Croft  Examination Guidelines: A complete evaluation includes B-mode imaging, spectral Doppler, color Doppler, and power Doppler as needed of all accessible portions of each vessel. Bilateral testing is considered an integral part of a complete examination. Limited examinations for reoccurring indications may be performed as noted.  Abdominal Aorta Findings: +-----------+-------+----------+----------+--------+--------+--------+ Location   AP (cm)Trans (cm)PSV (cm/s)WaveformThrombusComments +-----------+-------+----------+----------+--------+--------+--------+ Proximal   2.20   2.20      76                                 +-----------+-------+----------+----------+--------+--------+--------+ Mid        1.70   2.10      72                                  +-----------+-------+----------+----------+--------+--------+--------+ Distal     2.00   2.20      67                                 +-----------+-------+----------+----------+--------+--------+--------+ RT CIA Prox1.1    1.3       46                                 +-----------+-------+----------+----------+--------+--------+--------+ LT CIA Prox1.0    1.1       69                                 +-----------+-------+----------+----------+--------+--------+--------+ Visualization of the Mid Abdominal Aorta was limited.  Summary: Abdominal Aorta: No evidence of an abdominal aortic aneurysm was visualized.  *See table(s) above for measurements and observations.  Electronically signed by Servando Snare MD on 01/13/2020 at 9:25:33 AM.   Final     ASSESSMENT & PLAN:   74 yo AAM with   1) Microcytic hypochromic Anemia due  to Iron deficiency - now resolved.  Previously had Ferritin 27, Iron sat too low to calculate. No overt evidence of bleeding. Patient notes recent fecal occult blood neg x 1. He has had significant iron deficiency anemia in the past in 2011 with a ferritin level of 9 and required a blood transfusion. EGD showed duodenitis and hiatal hernia. Patient remains at high risk of GI bleeding since he is on ASA+ plavix for his endovascular stent graft for repaired AAA and is on Cilostazole for PAD. Some element of anemia also appears to be temporally related to methimazole use (though agranulocytosis is the usual concern and isolated anemia is less usual) The hyperthyroidism itself can also contribute to the anemia until corrected. SPEP with no M spike No evidence of hemolysis B12/folate WNL   PLAN: -Discussed pt labwork today, 02/08/20; no anemia, other blood counts and chemistries are nml, Ferritin is low nml. -Discussed 02/02/20 Fecal Occult Blood is Positive x3 -Apparent GI bleeding is not causing anemia at this time.  -Continue 150 mg PO Iron Polysaccharide once  a day  -Management of GI bleeding issues per PCP. -Would prefer to keep Ferritin >= 100  -Will see back in 6 months with labs   FOLLOW UP: RTC with Dr Irene Limbo with labs in 6 months   The total time spent in the appt was 20 minutes and more than 50% was on counseling and direct patient cares.  All of the patient's questions were answered with apparent satisfaction. The patient knows to call the clinic with any problems, questions or concerns.    Sullivan Lone MD [place] AAHIVMS [facility] Hematology/Oncology Physician [facility]  (Office):       [phone] (Work cell):  [phone] (Fax):           [phone]  I, Yevette Edwards, am acting as a scribe for Dr. Sullivan Lone.   .I have reviewed the above documentation for accuracy and completeness, and I agree with the above. Brunetta Genera MD

## 2020-02-15 ENCOUNTER — Telehealth: Payer: Self-pay | Admitting: *Deleted

## 2020-02-15 NOTE — Telephone Encounter (Signed)
Contacted patient regarding test results per Dr. Grier Mitts directions.  Patient verbalized understanding and states he does not want to have IV iron at this time and will continue po iron. Advised patient to contact office as needed prior to next scheduled appt. Patient in agreement

## 2020-02-15 NOTE — Telephone Encounter (Signed)
-----   Message from Brunetta Genera, MD sent at 02/15/2020 12:11 AM EST ----- Plz let Mr Shawn Fernandez know if ferritin is low despite the po iron and would offer him option to receive 1 additional dose of IV Injectafer. Pl schedule if agreeable. thx

## 2020-02-20 ENCOUNTER — Other Ambulatory Visit: Payer: Self-pay | Admitting: Internal Medicine

## 2020-03-21 ENCOUNTER — Telehealth: Payer: Self-pay

## 2020-03-21 NOTE — Telephone Encounter (Signed)
Patient notified samples of synthroid 125 mcg ready for pickup.

## 2020-04-10 ENCOUNTER — Encounter: Payer: Self-pay | Admitting: Internal Medicine

## 2020-04-10 ENCOUNTER — Ambulatory Visit (INDEPENDENT_AMBULATORY_CARE_PROVIDER_SITE_OTHER): Payer: Medicare Other | Admitting: Internal Medicine

## 2020-04-10 ENCOUNTER — Other Ambulatory Visit: Payer: Self-pay

## 2020-04-10 VITALS — BP 152/70 | HR 57 | Temp 97.8°F | Ht 67.0 in | Wt 146.6 lb

## 2020-04-10 DIAGNOSIS — I739 Peripheral vascular disease, unspecified: Secondary | ICD-10-CM | POA: Diagnosis not present

## 2020-04-10 DIAGNOSIS — I10 Essential (primary) hypertension: Secondary | ICD-10-CM | POA: Diagnosis not present

## 2020-04-10 DIAGNOSIS — E039 Hypothyroidism, unspecified: Secondary | ICD-10-CM

## 2020-04-10 NOTE — Progress Notes (Signed)
Durenda Hurt Wiggins,acting as a scribe for [name], MD.,have documented all relevant documentation on the behalf of [name], MD,as directed by  [name], MD while in the presence of [name], MD. This visit occurred during the SARS-CoV-2 public health emergency.  Safety protocols were in place, including screening questions prior to the visit, additional usage of staff PPE, and extensive cleaning of exam room while observing appropriate contact time as indicated for disinfecting solutions.  Subjective:     Patient ID: Shawn Fernandez , male    DOB: May 06, 1945 , 75 y.o.   MRN: [id number]   Chief Complaint  Patient presents with  . Hypertension    HPI  He presents today for HTN and thyroid f/u.  He reports compliance with all meds. He denies having any headaches, chest pain and shortness of breath.   Hypertension This is a chronic problem. The current episode started more than 1 year ago. The problem has been gradually improving since onset. The problem is controlled. Pertinent negatives include no blurred vision, chest pain, headaches, palpitations or shortness of breath. Risk factors for coronary artery disease include male gender and dyslipidemia.     Past Medical History:  Diagnosis Date  . Blood clot in abdominal vein    treated at [facility]  . Carotid artery disease ([place])   . Dyslipidemia   . FHx: heart disease   . History of blood transfusion 8-9 yrs ago  . HTN (hypertension)   . Peripheral vascular disease ([place])      Family History  Problem Relation Age of Onset  . Heart disease Brother   . Heart disease Mother   . Cancer Father        ? type     Current Outpatient Medications:  .  amLODipine (NORVASC) 10 MG tablet, TAKE 1 TABLET BY MOUTH EVERY DAY IN THE MORNING, Disp: 90 tablet, Rfl: 2 .  aspirin EC 81 MG tablet, Take 81 mg by mouth daily., Disp: , Rfl:  .  benzonatate (TESSALON) 200 MG capsule, Take 1 capsule (200 mg total) by mouth 2  (two) times daily as needed for cough., Disp: 20 capsule, Rfl: 0 .  Cholecalciferol (VITAMIN D3) 1000 units CAPS, Take 1 capsule by mouth daily., Disp: , Rfl:  .  cilostazol (PLETAL) 50 MG tablet, Take 50 mg by mouth every morning., Disp: , Rfl:  .  clopidogrel (PLAVIX) 75 MG tablet, TAKE 1 TABLET BY MOUTH EVERY DAY, Disp: 90 tablet, Rfl: 1 .  iron polysaccharides (NIFEREX) 150 MG capsule, Take 1 capsule (150 mg total) by mouth daily., Disp: 30 capsule, Rfl: 5 .  Iron-FA-B Cmp-C-Biot-Probiotic (FUSION PLUS) CAPS, TAKE 1 CAPSULE BY MOUTH EVERY DAY, Disp: 30 capsule, Rfl: 1 .  levothyroxine (SYNTHROID) 125 MCG tablet, Take 1 tablet (125 mcg total) by mouth daily. (Patient taking differently: Take 125 mcg by mouth daily. Monday - Saturday), Disp: 30 tablet, Rfl: 11 .  losartan (COZAAR) 100 MG tablet, TAKE 1 TABLET BY MOUTH EVERY DAY, Disp: 90 tablet, Rfl: 1 .  Multiple Vitamin (MULTIVITAMIN WITH MINERALS) TABS tablet, Take 1 tablet by mouth daily., Disp: , Rfl:  .  pantoprazole (PROTONIX) 40 MG tablet, TAKE 1 TABLET BY MOUTH EVERY DAY, Disp: 90 tablet, Rfl: 2 .  simvastatin (ZOCOR) 20 MG tablet, TAKE 1 TABLET BY MOUTH EVERY DAY IN THE EVENING, Disp: 90 tablet, Rfl: 1   Allergies  Allergen Reactions  . Lisinopril Cough     Review  of Systems  Constitutional: Negative.   Eyes: Negative for blurred vision.  Respiratory: Negative.  Negative for shortness of breath.   Cardiovascular: Negative.  Negative for chest pain and palpitations.  Gastrointestinal: Negative.   Neurological: Negative for headaches.  Psychiatric/Behavioral: Negative.   All other systems reviewed and are negative.    Today's Vitals   04/10/20 1050  BP: (!) 152/70  Pulse: (!) 57  Temp: 97.8 F (36.6 C)  TempSrc: Oral  Weight: 146 lb 9.6 oz (66.5 kg)  Height: 5\' 7"  (1.702 m)  PainSc: 0-No pain   Body mass index is 22.96 kg/m.  Wt Readings from Last 3 Encounters:  04/19/20 145 lb 9.6 oz (66 kg)  04/10/20 146 lb 9.6  oz (66.5 kg)  02/08/20 144 lb 12.8 oz (65.7 kg)   BP Readings from Last 3 Encounters:  04/19/20 (!) 150/80  04/10/20 (!) 152/70  02/08/20 (!) 187/71     Objective:  Physical Exam Vitals and nursing note reviewed.  Constitutional:      Appearance: Normal appearance.  HENT:     Head: Normocephalic and atraumatic.     Nose:     Comments: Masked     Mouth/Throat:     Comments: Masked  Cardiovascular:     Rate and Rhythm: Normal rate and regular rhythm.     Heart sounds: Normal heart sounds.  Pulmonary:     Effort: Pulmonary effort is normal.     Breath sounds: Normal breath sounds.  Musculoskeletal:     Cervical back: Normal range of motion.  Skin:    General: Skin is warm.  Neurological:     General: No focal deficit present.     Mental Status: He is alert.  Psychiatric:        Mood and Affect: Mood normal.         Assessment And Plan:     1. Essential hypertension, benign Comments: Chronic, uncontrolled. He does not wish to change meds at this time. Importance of low sodium diet stressed to patient. He will rto in 2 weeks for a nurse visit. If BP elevated, I will switch him from losartan to valsartan. Previous BMP results reviewed, no need to recheck today.   2. Acquired hypothyroidism Comments: I will check thyroid panel and adjust meds as needed.  - Thyroid Panel With TSH  3. Peripheral vascular disease, unspecified ([place]) Comments: Chronic, he will c/w pletal and plavix. He is encouraged to aim for at least 150 minutes of exercise per week.  He is advised to follow heart healthy lifestyle and comply with statin therapy.    Patient was given opportunity to ask questions. Patient verbalized understanding of the plan and was able to repeat key elements of the plan. All questions were answered to their satisfaction.   I, [name], MD, have reviewed all documentation for this visit. The documentation on 04/22/20 for the exam, diagnosis, procedures, and orders  are all accurate and complete.  THE PATIENT IS ENCOURAGED TO PRACTICE SOCIAL DISTANCING DUE TO THE COVID-19 PANDEMIC.

## 2020-04-11 LAB — THYROID PANEL WITH TSH
Free Thyroxine Index: 2.2 (ref 1.2–4.9)
T3 Uptake Ratio: 26 % (ref 24–39)
T4, Total: 8.4 ug/dL (ref 4.5–12.0)
TSH: 6.25 u[IU]/mL — ABNORMAL HIGH (ref 0.450–4.500)

## 2020-04-14 LAB — SPECIMEN STATUS REPORT

## 2020-04-14 LAB — T4, FREE: Free T4: 1.18 ng/dL (ref 0.82–1.77)

## 2020-04-19 ENCOUNTER — Ambulatory Visit (INDEPENDENT_AMBULATORY_CARE_PROVIDER_SITE_OTHER): Payer: Medicare Other

## 2020-04-19 ENCOUNTER — Ambulatory Visit: Payer: Medicare Other | Admitting: Internal Medicine

## 2020-04-19 ENCOUNTER — Other Ambulatory Visit: Payer: Self-pay

## 2020-04-19 VITALS — BP 150/80 | HR 59 | Temp 98.1°F | Ht 68.0 in | Wt 145.6 lb

## 2020-04-19 DIAGNOSIS — Z0000 Encounter for general adult medical examination without abnormal findings: Secondary | ICD-10-CM | POA: Diagnosis not present

## 2020-04-19 NOTE — Progress Notes (Signed)
This visit occurred during the SARS-CoV-2 public health emergency.  Safety protocols were in place, including screening questions prior to the visit, additional usage of staff PPE, and extensive cleaning of exam room while observing appropriate contact time as indicated for disinfecting solutions.  Subjective:   Shawn Fernandez is a 75 y.o. male who presents for Medicare Annual/Subsequent preventive examination.  Review of Systems     Cardiac Risk Factors include: advanced age (>61men, >46 women);male gender     Objective:    Today's Vitals   04/19/20 1432  BP: (!) 150/80  Pulse: (!) 59  Temp: 98.1 F (36.7 C)  TempSrc: Oral  SpO2: 96%  Weight: 145 lb 9.6 oz (66 kg)  Height: 5\' 8"  (1.727 m)   Body mass index is 22.14 kg/m.  Advanced Directives 04/19/2020 09/28/2019 08/09/2019 09/15/2018 12/16/2017 03/21/2016 03/29/2015  Does Patient Have a Medical Advance Directive? Yes Yes No Yes Yes No No  Type of Paramedic of [place];Living will Living will;Healthcare Power of [place];Living will Living will - -  Does patient want to make changes to medical advance directive? - - - - No - Patient declined - -  Copy of [place] in Chart? No - copy requested No - copy requested - No - copy requested - - No - copy requested  Would patient like information on creating a medical advance directive? - - No - Patient declined - - - -    Current Medications (verified) Outpatient Encounter Medications as of 04/19/2020  Medication Sig  . amLODipine (NORVASC) 10 MG tablet TAKE 1 TABLET BY MOUTH EVERY DAY IN THE MORNING  . aspirin EC 81 MG tablet Take 81 mg by mouth daily.  . benzonatate (TESSALON) 200 MG capsule Take 1 capsule (200 mg total) by mouth 2 (two) times daily as needed for cough.  . Cholecalciferol (VITAMIN D3) 1000 units CAPS Take 1 capsule by mouth daily.  . cilostazol (PLETAL) 50 MG tablet Take 50 mg by mouth every  morning.  . clopidogrel (PLAVIX) 75 MG tablet TAKE 1 TABLET BY MOUTH EVERY DAY  . iron polysaccharides (NIFEREX) 150 MG capsule Take 1 capsule (150 mg total) by mouth daily.  . Iron-FA-B Cmp-C-Biot-Probiotic (FUSION PLUS) CAPS TAKE 1 CAPSULE BY MOUTH EVERY DAY  . losartan (COZAAR) 100 MG tablet TAKE 1 TABLET BY MOUTH EVERY DAY  . Multiple Vitamin (MULTIVITAMIN WITH MINERALS) TABS tablet Take 1 tablet by mouth daily.  . pantoprazole (PROTONIX) 40 MG tablet TAKE 1 TABLET BY MOUTH EVERY DAY  . simvastatin (ZOCOR) 20 MG tablet TAKE 1 TABLET BY MOUTH EVERY DAY IN THE EVENING  . levothyroxine (SYNTHROID) 125 MCG tablet Take 1 tablet (125 mcg total) by mouth daily. (Patient taking differently: Take 125 mcg by mouth daily. Monday - Saturday)   No facility-administered encounter medications on file as of 04/19/2020.    Allergies (verified) Lisinopril   History: Past Medical History:  Diagnosis Date  . Blood clot in abdominal vein    [facility] at [facility]  . Carotid artery disease ([place])   . Dyslipidemia   . FHx: heart disease   . History of blood transfusion 8-9 yrs ago  . HTN (hypertension)   . Peripheral vascular disease [facility])    Past Surgical History:  Procedure Laterality Date  . ANKLE SURGERY Right yrs ago  . arm surgery Left yrs ago   2 rods inserted, 1 rod later removed  . COLONOSCOPY WITH PROPOFOL  N/A 09/16/2013   Procedure: COLONOSCOPY WITH PROPOFOL;  Surgeon: Beryle Beams, MD;  Location: [facility] ENDOSCOPY;  Service: Endoscopy;  Laterality: N/A;  . LOWER EXTREMITY ANGIOGRAM N/A 07/24/2011   Procedure: LOWER EXTREMITY ANGIOGRAM;  Surgeon: Lorretta Harp, MD;  Location: [facility] CATH LAB;  Service: Cardiovascular;  Laterality: N/A;  . stent in abdominal vein  4-5 yrs ago  . US ECHOCARDIOGRAPHY  07-20-2007   EF 55-60%   Family History  Problem Relation Age of Onset  . Heart disease Brother   . Heart disease Mother   . Cancer Father        ? type   Social History   Socioeconomic  History  . Marital status: Married    Spouse name: Not on file  . Number of children: Not on file  . Years of education: Not on file  . Highest education level: Not on file  Occupational History  . Occupation: retired  Tobacco Use  . Smoking status: Former Smoker    Packs/day: 0.75    Years: 20.00    Pack years: 15.00    Types: Cigarettes    Quit date: 07/01/2009    Years since quitting: 10.8  . Smokeless tobacco: Never Used  Vaping Use  . Vaping Use: Never used  Substance and Sexual Activity  . Alcohol use: Not Currently    Comment: only occ  . Drug use: No  . Sexual activity: Yes  Other Topics Concern  . Not on file  Social History Narrative  . Not on file   Social Determinants of Health   Financial Resource Strain: Low Risk   . Difficulty of Paying Living Expenses: Not hard at all  Food Insecurity: No Food Insecurity  . Worried About Charity fundraiser in the Last Year: Never true  . Ran Out of Food in the Last Year: Never true  Transportation Needs: No Transportation Needs  . Lack of Transportation (Medical): No  . Lack of Transportation (Non-Medical): No  Physical Activity: Sufficiently Active  . Days of Exercise per Week: 7 days  . Minutes of Exercise per Session: 30 min  Stress: No Stress Concern Present  . Feeling of Stress : Not at all  Social Connections: Not on file    Tobacco Counseling Counseling given: Not Answered   Clinical Intake:  Pre-visit preparation completed: Yes  Pain : No/denies pain     Nutritional Status: BMI of 19-24  Normal Nutritional Risks: None Diabetes: No  How often do you need to have someone help you when you read instructions, pamphlets, or other written materials from your doctor or pharmacy?: 1 - Never What is the last grade level you completed in school?: 14 yrs  Diabetic? No   Interpreter Needed?: No  Information entered by :: NAllen LPN   Activities of Daily Living In your present state of health, do you  have any difficulty performing the following activities: 04/19/2020 09/28/2019  Hearing? N N  Vision? N N  Difficulty concentrating or making decisions? N N  Walking or climbing stairs? N N  Dressing or bathing? N N  Doing errands, shopping? N N  Preparing Food and eating ? N N  Using the Toilet? N N  In the past six months, have you accidently leaked urine? N N  Do you have problems with loss of bowel control? N N  Managing your Medications? N N  Managing your Finances? N N  Housekeeping or managing your Housekeeping? N N  Some  recent data might be hidden    Patient Care Team: [name], MD as PCP - General (Internal Medicine) Lorretta Harp, MD as PCP - Cardiology (Cardiology) Lorretta Harp, MD as Consulting Physician (Cardiology)  Indicate any recent Medical Services you may have received from other than [facility] providers in the past year (date may be approximate).     Assessment:   This is a routine wellness examination for Shawn Fernandez.  Hearing/Vision screen No exam data present  Dietary issues and exercise activities discussed: Current Exercise Habits: Home exercise routine, Type of exercise: walking, Time (Minutes): 30, Frequency (Times/Week): 7, Weekly Exercise (Minutes/Week): 210  Goals    . Patient Stated     Stay healthy    . Patient Stated     09/28/2019, stay healthy    . Patient Stated     04/19/2020, stay healthy      Depression Screen PHQ 2/9 Scores 04/19/2020 09/28/2019 07/13/2019 11/09/2018 11/03/2018 09/15/2018 06/17/2018  PHQ - 2 Score 0 0 0 0 0 0 0  PHQ- 9 Score - 0 - - - 0 -    Fall Risk Fall Risk  04/19/2020 09/28/2019 07/13/2019 11/09/2018 11/09/2018  Falls in the past year? 0 0 0 1 0  Number falls in past yr: - - - 0 0  Injury with Fall? - - - 0 -  Risk for fall due to : Medication side effect Medication side effect - - -  Follow up Falls evaluation completed;Education provided;Falls prevention discussed Falls evaluation completed;Education  provided;Falls prevention discussed - - -    FALL RISK PREVENTION PERTAINING TO THE HOME:  Any stairs in or around the home? No  If so, are there any without handrails? n/a Home free of loose throw rugs in walkways, pet beds, electrical cords, etc? Yes  Adequate lighting in your home to reduce risk of falls? Yes   ASSISTIVE DEVICES UTILIZED TO PREVENT FALLS:  Life alert? No  Use of a cane, walker or w/c? No  Grab bars in the bathroom? Yes  Shower chair or bench in shower? Yes  Elevated toilet seat or a handicapped toilet? No   TIMED UP AND GO:  Was the test performed? No . .   Gait steady and fast without use of assistive device  Cognitive Function:     6CIT Screen 09/28/2019 09/15/2018 12/16/2017  What Year? 0 points 0 points 0 points  What month? 0 points 0 points 0 points  What time? 0 points 0 points 0 points  Count back from 20 0 points 0 points 0 points  Months in reverse 4 points 0 points 0 points  Repeat phrase 2 points 0 points 0 points  Total Score 6 0 0    Immunizations Immunization History  Administered Date(s) Administered  . Influenza, High Dose Seasonal PF 11/04/2016, 12/02/2017, 01/01/2019, 12/28/2019  . Influenza-Unspecified 12/04/2017, 01/01/2019  . PFIZER(Purple Top)SARS-COV-2 Vaccination 04/14/2019, 05/09/2019, 01/11/2020  . Pneumococcal Conjugate-13 12/04/2017  . Pneumococcal-Unspecified 11/30/2014    TDAP status: Up to date  Flu Vaccine status: Up to date  Pneumococcal vaccine status: Up to date  Covid-19 vaccine status: Completed vaccines  Qualifies for Shingles Vaccine? Yes   Zostavax completed No   Shingrix Completed?: No.    Education has been provided regarding the importance of this vaccine. Patient has been advised to call insurance company to determine out of pocket expense if they have not yet received this vaccine. Advised may also receive vaccine at local pharmacy or Health Dept.  Verbalized acceptance and  understanding.  Screening Tests Health Maintenance  Topic Date Due  . TETANUS/TDAP  11/17/2022  . COLONOSCOPY (Pts 45-80yrs Insurance coverage will need to be confirmed)  02/08/2029  . INFLUENZA VACCINE  Completed  . COVID-19 Vaccine  Completed  . Hepatitis C Screening  Completed  . PNA vac Low Risk Adult  Completed    Health Maintenance  There are no preventive care reminders to display for this patient.  Colorectal cancer screening: No longer required.   Lung Cancer Screening: (Low Dose CT Chest recommended if Age 63-80 years, 30 pack-year currently smoking OR have quit w/in 15years.) does not qualify.   Lung Cancer Screening Referral: no  Additional Screening:  Hepatitis C Screening: does qualify; Completed 11/03/2018  Vision Screening: Recommended annual ophthalmology exams for early detection of glaucoma and other disorders of the eye. Is the patient up to date with their annual eye exam?  Yes  Who is the provider or what is the name of the office in which the patient attends annual eye exams? Dr. Katy Fitch If pt is not established with a provider, would they like to be referred to a provider to establish care? No .   Dental Screening: Recommended annual dental exams for proper oral hygiene  Community Resource Referral / Chronic Care Management: CRR required this visit?  No   CCM required this visit?  No      Plan:     I have personally reviewed and noted the following in the patient's chart:   . Medical and social history . Use of alcohol, tobacco or illicit drugs  . Current medications and supplements . Functional ability and status . Nutritional status . Physical activity . Advanced directives . List of other physicians . Hospitalizations, surgeries, and ER visits in previous 12 months . Vitals . Screenings to include cognitive, depression, and falls . Referrals and appointments  In addition, I have reviewed and discussed with patient certain preventive  protocols, quality metrics, and best practice recommendations. A written personalized care plan for preventive services as well as general preventive health recommendations were provided to patient.     Kellie Simmering, LPN   [id number]/61/[id number]   Nurse Notes:

## 2020-04-19 NOTE — Patient Instructions (Signed)
Mr. Shawn Fernandez , Thank you for taking time to come for your Medicare Wellness Visit. I appreciate your ongoing commitment to your health goals. Please review the following plan we discussed and let me know if I can assist you in the future.   Screening recommendations/referrals: Colonoscopy: not required Recommended yearly ophthalmology/optometry visit for glaucoma screening and checkup Recommended yearly dental visit for hygiene and checkup  Vaccinations: Influenza vaccine: completed 12/28/2019, due 10/01/2020 Pneumococcal vaccine: completed 12/04/2017 Tdap vaccine: completed 11/16/2012, due 11/17/2022 Shingles vaccine: discussed   Covid-19:  01/11/2020, 05/09/2019, 04/14/2019  Advanced directives: Please bring a copy of your POA (Power of Attorney) and/or Living Will to your next appointment.   Conditions/risks identified: none  Next appointment: Follow up in one year for your annual wellness visit.   Preventive Care 30 Years and Older, Male Preventive care refers to lifestyle choices and visits with your health care provider that can promote health and wellness. What does preventive care include?  A yearly physical exam. This is also called an annual well check.  Dental exams once or twice a year.  Routine eye exams. Ask your health care provider how often you should have your eyes checked.  Personal lifestyle choices, including:  Daily care of your teeth and gums.  Regular physical activity.  Eating a healthy diet.  Avoiding tobacco and drug use.  Limiting alcohol use.  Practicing safe sex.  Taking low doses of aspirin every day.  Taking vitamin and mineral supplements as recommended by your health care provider. What happens during an annual well check? The services and screenings done by your health care provider during your annual well check will depend on your age, overall health, lifestyle risk factors, and family history of disease. Counseling  Your health care  provider may ask you questions about your:  Alcohol use.  Tobacco use.  Drug use.  Emotional well-being.  Home and relationship well-being.  Sexual activity.  Eating habits.  History of falls.  Memory and ability to understand (cognition).  Work and work Statistician. Screening  You may have the following tests or measurements:  Height, weight, and BMI.  Blood pressure.  Lipid and cholesterol levels. These may be checked every 5 years, or more frequently if you are over 60 years old.  Skin check.  Lung cancer screening. You may have this screening every year starting at age 69 if you have a 30-pack-year history of smoking and currently smoke or have quit within the past 15 years.  Fecal occult blood test (FOBT) of the stool. You may have this test every year starting at age 53.  Flexible sigmoidoscopy or colonoscopy. You may have a sigmoidoscopy every 5 years or a colonoscopy every 10 years starting at age 31.  Prostate cancer screening. Recommendations will vary depending on your family history and other risks.  Hepatitis C blood test.  Hepatitis B blood test.  Sexually transmitted disease (STD) testing.  Diabetes screening. This is done by checking your blood sugar (glucose) after you have not eaten for a while (fasting). You may have this done every 1-3 years.  Abdominal aortic aneurysm (AAA) screening. You may need this if you are a current or former smoker.  Osteoporosis. You may be screened starting at age 65 if you are at high risk. Talk with your health care provider about your test results, treatment options, and if necessary, the need for more tests. Vaccines  Your health care provider may recommend certain vaccines, such as:  Influenza vaccine. This  is recommended every year.  Tetanus, diphtheria, and acellular pertussis (Tdap, Td) vaccine. You may need a Td booster every 10 years.  Zoster vaccine. You may need this after age 70.  Pneumococcal  13-valent conjugate (PCV13) vaccine. One dose is recommended after age 1.  Pneumococcal polysaccharide (PPSV23) vaccine. One dose is recommended after age 37. Talk to your health care provider about which screenings and vaccines you need and how often you need them. This information is not intended to replace advice given to you by your health care provider. Make sure you discuss any questions you have with your health care provider. Document Released: 03/16/2015 Document Revised: 11/07/2015 Document Reviewed: 12/19/2014 Elsevier Interactive Patient Education  2017 [place] Prevention in the Home Falls can cause injuries. They can happen to people of all ages. There are many things you can do to make your home safe and to help prevent falls. What can I do on the outside of my home?  Regularly fix the edges of walkways and driveways and fix any cracks.  Remove anything that might make you trip as you walk through a door, such as a raised step or threshold.  Trim any bushes or trees on the path to your home.  Use bright outdoor lighting.  Clear any walking paths of anything that might make someone trip, such as rocks or tools.  Regularly check to see if handrails are loose or broken. Make sure that both sides of any steps have handrails.  Any raised decks and porches should have guardrails on the edges.  Have any leaves, snow, or ice cleared regularly.  Use sand or salt on walking paths during winter.  Clean up any spills in your garage right away. This includes oil or grease spills. What can I do in the bathroom?  Use night lights.  Install grab bars by the toilet and in the tub and shower. Do not use towel bars as grab bars.  Use non-skid mats or decals in the tub or shower.  If you need to sit down in the shower, use a plastic, non-slip stool.  Keep the floor dry. Clean up any water that spills on the floor as soon as it happens.  Remove soap buildup in the  tub or shower regularly.  Attach bath mats securely with double-sided non-slip rug tape.  Do not have throw rugs and other things on the floor that can make you trip. What can I do in the bedroom?  Use night lights.  Make sure that you have a light by your bed that is easy to reach.  Do not use any sheets or blankets that are too big for your bed. They should not hang down onto the floor.  Have a firm chair that has side arms. You can use this for support while you get dressed.  Do not have throw rugs and other things on the floor that can make you trip. What can I do in the kitchen?  Clean up any spills right away.  Avoid walking on wet floors.  Keep items that you use a lot in easy-to-reach places.  If you need to reach something above you, use a strong step stool that has a grab bar.  Keep electrical cords out of the way.  Do not use floor polish or wax that makes floors slippery. If you must use wax, use non-skid floor wax.  Do not have throw rugs and other things on the floor that can make you  trip. What can I do with my stairs?  Do not leave any items on the stairs.  Make sure that there are handrails on both sides of the stairs and use them. Fix handrails that are broken or loose. Make sure that handrails are as long as the stairways.  Check any carpeting to make sure that it is firmly attached to the stairs. Fix any carpet that is loose or worn.  Avoid having throw rugs at the top or bottom of the stairs. If you do have throw rugs, attach them to the floor with carpet tape.  Make sure that you have a light switch at the top of the stairs and the bottom of the stairs. If you do not have them, ask someone to add them for you. What else can I do to help prevent falls?  Wear shoes that:  Do not have high heels.  Have rubber bottoms.  Are comfortable and fit you well.  Are closed at the toe. Do not wear sandals.  If you use a stepladder:  Make sure that it  is fully opened. Do not climb a closed stepladder.  Make sure that both sides of the stepladder are locked into place.  Ask someone to hold it for you, if possible.  Clearly mark and make sure that you can see:  Any grab bars or handrails.  First and last steps.  Where the edge of each step is.  Use tools that help you move around (mobility aids) if they are needed. These include:  Canes.  Walkers.  Scooters.  Crutches.  Turn on the lights when you go into a dark area. Replace any light bulbs as soon as they burn out.  Set up your furniture so you have a clear path. Avoid moving your furniture around.  If any of your floors are uneven, fix them.  If there are any pets around you, be aware of where they are.  Review your medicines with your doctor. Some medicines can make you feel dizzy. This can increase your chance of falling. Ask your doctor what other things that you can do to help prevent falls. This information is not intended to replace advice given to you by your health care provider. Make sure you discuss any questions you have with your health care provider. Document Released: 12/14/2008 Document Revised: 07/26/2015 Document Reviewed: 03/24/2014 Elsevier Interactive Patient Education  2017 [employer].

## 2020-04-23 ENCOUNTER — Telehealth: Payer: Self-pay

## 2020-04-23 NOTE — Telephone Encounter (Signed)
Per RS: Is he willing to change meds if needed? If needed, I will need to switch him from losartan to valsartan if bp is elevated Pt is ok with meds being switched if needed

## 2020-04-24 ENCOUNTER — Other Ambulatory Visit: Payer: Self-pay

## 2020-04-24 ENCOUNTER — Ambulatory Visit: Payer: Medicare Other

## 2020-04-24 VITALS — BP 150/74 | HR 98 | Temp 97.8°F | Ht 68.0 in | Wt 147.0 lb

## 2020-04-24 DIAGNOSIS — I10 Essential (primary) hypertension: Secondary | ICD-10-CM

## 2020-04-24 MED ORDER — VALSARTAN 160 MG PO TABS: 160.0000 mg | ORAL_TABLET | Freq: Every day | ORAL | 1 refills | Status: DC

## 2020-04-24 NOTE — Progress Notes (Signed)
BP Readings from Last 3 Encounters:  04/24/20 (!) 150/74  04/19/20 (!) 150/80  04/10/20 (!) 152/70   Pt is here today for a BP reading. Provider advised pt to change medications from losartan to valsartan 160mg , which was started on the night of 04/24/2020. Pt is to come back in 2 weeks to check BMP and BP, then will return in the following 6 weeks to have a follow up visit with doctor [facility].

## 2020-05-02 ENCOUNTER — Other Ambulatory Visit: Payer: Self-pay | Admitting: Internal Medicine

## 2020-05-08 ENCOUNTER — Ambulatory Visit: Payer: Medicare Other

## 2020-05-16 ENCOUNTER — Other Ambulatory Visit: Payer: Self-pay | Admitting: Internal Medicine

## 2020-05-16 DIAGNOSIS — I10 Essential (primary) hypertension: Secondary | ICD-10-CM

## 2020-05-31 ENCOUNTER — Other Ambulatory Visit: Payer: Self-pay | Admitting: Internal Medicine

## 2020-05-31 DIAGNOSIS — I10 Essential (primary) hypertension: Secondary | ICD-10-CM

## 2020-06-14 ENCOUNTER — Telehealth: Payer: Self-pay

## 2020-06-14 NOTE — Telephone Encounter (Signed)
The pt was notified that samples of synthroid 125 mcg is ready for pickup.

## 2020-06-18 ENCOUNTER — Other Ambulatory Visit: Payer: Self-pay

## 2020-06-18 ENCOUNTER — Ambulatory Visit (INDEPENDENT_AMBULATORY_CARE_PROVIDER_SITE_OTHER): Payer: Medicare Other | Admitting: Internal Medicine

## 2020-06-18 ENCOUNTER — Encounter: Payer: Self-pay | Admitting: Internal Medicine

## 2020-06-18 VITALS — BP 164/82 | HR 51 | Temp 97.7°F | Ht 68.0 in | Wt 148.6 lb

## 2020-06-18 DIAGNOSIS — E039 Hypothyroidism, unspecified: Secondary | ICD-10-CM

## 2020-06-18 DIAGNOSIS — I10 Essential (primary) hypertension: Secondary | ICD-10-CM

## 2020-06-18 MED ORDER — HYDRALAZINE HCL 25 MG PO TABS: 25.0000 mg | ORAL_TABLET | Freq: Two times a day (BID) | ORAL | 11 refills | Status: DC

## 2020-06-18 NOTE — Patient Instructions (Signed)

## 2020-06-18 NOTE — Progress Notes (Signed)
I,Katawbba Wiggins,acting as a Education administrator for [name], MD.,have documented all relevant documentation on the behalf of [name], MD,as directed by  [name], MD while in the presence of [name], MD.  This visit occurred during the SARS-CoV-2 public health emergency.  Safety protocols were in place, including screening questions prior to the visit, additional usage of staff PPE, and extensive cleaning of exam room while observing appropriate contact time as indicated for disinfecting solutions.  Subjective:     Patient ID: Shawn Fernandez , male    DOB: February 01, 1946 , 75 y.o.   MRN: [id number]   Chief Complaint  Patient presents with  . Hypertension    HPI  He presents today for HTN and thyroid f/u.  He reports compliance with meds. He denies headaches, chest pain and shortness of breath. Denies feeling stressed today.  Admits he smoked some ribs yesterday for Easter.   Hypertension This is a chronic problem. The current episode started more than 1 year ago. The problem has been gradually improving since onset. The problem is controlled. Pertinent negatives include no blurred vision, chest pain, headaches, palpitations or shortness of breath. Risk factors for coronary artery disease include male gender and dyslipidemia.     Past Medical History:  Diagnosis Date  . Blood clot in abdominal vein    treated at [facility]  . Carotid artery disease ([place])   . Dyslipidemia   . FHx: heart disease   . History of blood transfusion 8-9 yrs ago  . HTN (hypertension)   . Peripheral vascular disease ([place])      Family History  Problem Relation Age of Onset  . Heart disease Brother   . Heart disease Mother   . Cancer Father        ? type     Current Outpatient Medications:  .  amLODipine (NORVASC) 10 MG tablet, TAKE 1 TABLET BY MOUTH EVERY DAY IN THE MORNING, Disp: 90 tablet, Rfl: 2 .  aspirin EC 81 MG tablet, Take 81 mg by mouth daily., Disp: , Rfl:  .   Cholecalciferol (VITAMIN D3) 1000 units CAPS, Take 1 capsule by mouth daily., Disp: , Rfl:  .  cilostazol (PLETAL) 50 MG tablet, Take 50 mg by mouth every morning., Disp: , Rfl:  .  clopidogrel (PLAVIX) 75 MG tablet, TAKE 1 TABLET BY MOUTH EVERY DAY, Disp: 90 tablet, Rfl: 1 .  hydrALAZINE (APRESOLINE) 25 MG tablet, Take 1 tablet (25 mg total) by mouth 2 (two) times daily., Disp: 60 tablet, Rfl: 11 .  iron polysaccharides (NIFEREX) 150 MG capsule, Take 1 capsule (150 mg total) by mouth daily., Disp: 30 capsule, Rfl: 5 .  levothyroxine (SYNTHROID) 125 MCG tablet, Take 1 tablet (125 mcg total) by mouth daily. (Patient taking differently: Take 125 mcg by mouth daily. Monday - Saturday), Disp: 30 tablet, Rfl: 11 .  Multiple Vitamin (MULTIVITAMIN WITH MINERALS) TABS tablet, Take 1 tablet by mouth daily., Disp: , Rfl:  .  pantoprazole (PROTONIX) 40 MG tablet, TAKE 1 TABLET BY MOUTH EVERY DAY, Disp: 90 tablet, Rfl: 2 .  simvastatin (ZOCOR) 20 MG tablet, TAKE 1 TABLET BY MOUTH EVERY DAY IN THE EVENING, Disp: 90 tablet, Rfl: 1 .  valsartan (DIOVAN) 160 MG tablet, TAKE 1 TABLET BY MOUTH EVERY DAY, Disp: 90 tablet, Rfl: 1 .  benzonatate (TESSALON) 200 MG capsule, Take 1 capsule (200 mg total) by mouth 2 (two) times daily as needed for cough. (Patient not taking: Reported on  06/18/2020), Disp: 20 capsule, Rfl: 0   Allergies  Allergen Reactions  . Lisinopril Cough     Review of Systems  Constitutional: Negative.   Eyes: Negative for blurred vision.  Respiratory: Negative.  Negative for shortness of breath.   Cardiovascular: Negative.  Negative for chest pain and palpitations.  Gastrointestinal: Negative.   Neurological: Negative for headaches.  Psychiatric/Behavioral: Negative.   All other systems reviewed and are negative.    Today's Vitals   06/18/20 1044  BP: (!) 164/82  Pulse: (!) 51  Temp: 97.7 F (36.5 C)  TempSrc: Oral  Weight: 148 lb 9.6 oz (67.4 kg)  Height: _0  (1.727 m)  PainSc:  0-No pain   Body mass index is 22.59 kg/m.  Wt Readings from Last 3 Encounters:  06/18/20 148 lb 9.6 oz (67.4 kg)  04/24/20 147 lb (66.7 kg)  04/19/20 145 lb 9.6 oz (66 kg)   BP Readings from Last 3 Encounters:  06/18/20 (!) 164/82  04/24/20 (!) 150/74  04/19/20 (!) 150/80    Objective:  Physical Exam Vitals and nursing note reviewed.  Constitutional:      Appearance: Normal appearance.  HENT:     Head: Normocephalic and atraumatic.     Nose:     Comments: Masked     Mouth/Throat:     Comments: Masked  Cardiovascular:     Rate and Rhythm: Normal rate and regular rhythm.     Heart sounds: Normal heart sounds.  Pulmonary:     Effort: Pulmonary effort is normal.  Musculoskeletal:     Cervical back: Normal range of motion.  Skin:    General: Skin is warm.  Neurological:     General: No focal deficit present.     Mental Status: He is alert.  Psychiatric:        Mood and Affect: Mood normal.         Assessment And Plan:     1. Essential hypertension, benign Comments: Chronic, uncontrolled. Last 2 readings reviewed, also elevated. I will add hydralazine 50m twice daily, f/u in 2 weeks for a nurse visit.d  I will check renal function today.  - BMP8+EGFR  2. Acquired hypothyroidism Comments: I will check TSh and adjust meds as needed.  - TSH     Patient was given opportunity to ask questions. Patient verbalized understanding of the plan and was able to repeat key elements of the plan. All questions were answered to their satisfaction.   I, RMaximino Greenland MD, have reviewed all documentation for this visit. The documentation on 06/18/20 for the exam, diagnosis, procedures, and orders are all accurate and complete.   IF YOU HAVE BEEN REFERRED TO A SPECIALIST, IT MAY TAKE 1-2 WEEKS TO SCHEDULE/PROCESS THE REFERRAL. IF YOU HAVE NOT HEARD FROM US/SPECIALIST IN TWO WEEKS, PLEASE GIVE [place] CALL AT [phone].   THE PATIENT IS ENCOURAGED TO PRACTICE SOCIAL  DISTANCING DUE TO THE COVID-19 PANDEMIC.

## 2020-06-27 ENCOUNTER — Telehealth: Payer: Self-pay | Admitting: Hematology

## 2020-06-27 NOTE — Telephone Encounter (Signed)
R/s per 12/8 los, Patient aware

## 2020-06-28 ENCOUNTER — Ambulatory Visit: Payer: Medicare Other | Admitting: Internal Medicine

## 2020-07-03 ENCOUNTER — Other Ambulatory Visit: Payer: Self-pay

## 2020-07-03 ENCOUNTER — Ambulatory Visit (INDEPENDENT_AMBULATORY_CARE_PROVIDER_SITE_OTHER): Payer: Medicare Other

## 2020-07-03 VITALS — BP 152/80 | HR 85 | Temp 98.1°F | Ht 68.0 in | Wt 145.4 lb

## 2020-07-03 DIAGNOSIS — I10 Essential (primary) hypertension: Secondary | ICD-10-CM | POA: Diagnosis not present

## 2020-07-03 MED ORDER — HYDRALAZINE HCL 25 MG PO TABS: 25.0000 mg | ORAL_TABLET | Freq: Two times a day (BID) | ORAL | 11 refills | Status: DC

## 2020-07-03 NOTE — Progress Notes (Signed)
Pt is here today for a BP check. He is currently taking amlodipine 10mg  & valsartan 160mg . He drinks plentiful water. He says he takes his medicine at 830 every morning. He did admit have coffee this morning with 2 teaspoons of sugar.  BP Readings from Last 3 Encounters:  07/03/20 (!) 152/80  06/18/20 (!) 164/82  04/24/20 (!) 150/74  provider advised to add medications hydralazine 25mg .

## 2020-07-09 ENCOUNTER — Encounter (HOSPITAL_COMMUNITY): Payer: Self-pay

## 2020-07-09 ENCOUNTER — Ambulatory Visit (HOSPITAL_COMMUNITY)
Admission: EM | Admit: 2020-07-09 | Discharge: 2020-07-09 | Disposition: A | Discharge status: Home / Self Care | Payer: Medicare Other | Attending: Emergency Medicine | Admitting: Emergency Medicine

## 2020-07-09 ENCOUNTER — Other Ambulatory Visit: Payer: Self-pay

## 2020-07-09 DIAGNOSIS — J069 Acute upper respiratory infection, unspecified: Secondary | ICD-10-CM | POA: Diagnosis not present

## 2020-07-09 MED ORDER — BENZONATATE 100 MG PO CAPS: 200.0000 mg | ORAL_CAPSULE | Freq: Three times a day (TID) | ORAL | 0 refills | Status: DC | PRN

## 2020-07-09 NOTE — ED Provider Notes (Signed)
[place]    CSN: [id number] Arrival date & time: 07/09/20  1024      History   Chief Complaint Chief Complaint  Patient presents with  . Cough  . Nasal Congestion    HPI Shawn Fernandez is a 75 y.o. male.   Patient here for evaluation of cough and runny nose that has been ongoing since last Tuesday.  Reports taking OTC medications with some relief.  Reports cough is most concerning and that congestion has evidently improved.  Denies any trauma, injury, or other precipitating event.  Denies any specific alleviating or aggravating factors.  Denies any fevers, chest pain, shortness of breath, N/V/D, numbness, tingling, weakness, abdominal pain, or headaches.   ROS: As per HPI, all other pertinent ROS negative   The history is provided by the patient.    Past Medical History:  Diagnosis Date  . Blood clot in abdominal vein    treated at [facility]  . Carotid artery disease ([place])   . Dyslipidemia   . FHx: heart disease   . History of blood transfusion 8-9 yrs ago  . HTN (hypertension)   . Peripheral vascular disease [facility])     Patient Active Problem List   Diagnosis Date Noted  . Acquired hypothyroidism 11/21/2017  . Iron deficiency anemia due to chronic blood loss 03/29/2015  . Anemia 03/15/2015  . Hyperthyroidism 01/04/2015  . Claudication ([place]) 06/29/2014  . Essential hypertension 10/27/2012  . Hyperlipidemia 10/27/2012  . Peripheral arterial disease ([place]) 10/27/2012  . Carotid artery disease ([place]) 10/27/2012  . Cough 11/06/2011    Past Surgical History:  Procedure Laterality Date  . ANKLE SURGERY Right yrs ago  . arm surgery Left yrs ago   2 rods inserted, 1 rod later removed  . COLONOSCOPY WITH PROPOFOL N/A 09/16/2013   Procedure: COLONOSCOPY WITH PROPOFOL;  Surgeon: Beryle Beams, MD;  Location: [facility] ENDOSCOPY;  Service: Endoscopy;  Laterality: N/A;  . LOWER EXTREMITY ANGIOGRAM N/A 07/24/2011   Procedure: LOWER EXTREMITY ANGIOGRAM;  Surgeon:  Lorretta Harp, MD;  Location: [facility] CATH LAB;  Service: Cardiovascular;  Laterality: N/A;  . stent in abdominal vein  4-5 yrs ago  . US ECHOCARDIOGRAPHY  07-20-2007   EF 55-60%       Home Medications    Prior to Admission medications   Medication Sig Start Date End Date Taking? Authorizing Provider  amLODipine (NORVASC) 10 MG tablet TAKE 1 TABLET BY MOUTH EVERY DAY IN THE MORNING 09/28/19  Yes [name], MD  aspirin EC 81 MG tablet Take 81 mg by mouth daily.   Yes [provider]  benzonatate (TESSALON PERLES) 100 MG capsule Take 2 capsules (200 mg total) by mouth 3 (three) times daily as needed for cough. 07/09/20  Yes Pearson Forster, NP  Cholecalciferol (VITAMIN D3) 1000 units CAPS Take 1 capsule by mouth daily.   Yes [provider]  cilostazol (PLETAL) 50 MG tablet Take 50 mg by mouth every morning.   Yes [provider]  clopidogrel (PLAVIX) 75 MG tablet TAKE 1 TABLET BY MOUTH EVERY DAY 05/02/20  Yes [name], MD  hydrALAZINE (APRESOLINE) 25 MG tablet Take 1 tablet (25 mg total) by mouth 2 (two) times daily. 07/03/20 07/03/21 Yes [name], MD  iron polysaccharides (NIFEREX) 150 MG capsule Take 1 capsule (150 mg total) by mouth daily. 12/16/18  Yes [name], MD  levothyroxine (SYNTHROID) 125 MCG tablet Take 1 tablet (125 mcg total) by mouth daily. Patient taking differently:  Take 125 mcg by mouth daily. Monday - Saturday 11/03/18 01/09/20 Yes [name], MD  Multiple Vitamin (MULTIVITAMIN WITH MINERALS) TABS tablet Take 1 tablet by mouth daily.   Yes [provider]  pantoprazole (PROTONIX) 40 MG tablet TAKE 1 TABLET BY MOUTH EVERY DAY 08/17/19  Yes [name], MD  simvastatin (ZOCOR) 20 MG tablet TAKE 1 TABLET BY MOUTH EVERY DAY IN THE EVENING 02/08/20  Yes [name], MD  valsartan (DIOVAN) 160 MG tablet TAKE 1 TABLET BY MOUTH EVERY DAY 05/31/20  Yes [name], MD    Family History Family History  Problem Relation Age  of Onset  . Heart disease Brother   . Heart disease Mother   . Cancer Father        ? type    Social History Social History   Tobacco Use  . Smoking status: Former Smoker    Packs/day: 0.75    Years: 20.00    Pack years: 15.00    Types: Cigarettes    Quit date: 07/01/2009    Years since quitting: 11.0  . Smokeless tobacco: Never Used  Vaping Use  . Vaping Use: Never used  Substance Use Topics  . Alcohol use: Yes    Comment: only occ  . Drug use: No     Allergies   Lisinopril   Review of Systems Review of Systems   Physical Exam Triage Vital Signs ED Triage Vitals  Enc Vitals Group     BP 07/09/20 1157 (!) 150/77     Pulse Rate 07/09/20 1157 78     Resp 07/09/20 1157 18     Temp 07/09/20 1157 98.2 F (36.8 C)     Temp Source 07/09/20 1157 Oral     SpO2 07/09/20 1157 100 %     Weight --      Height --      Head Circumference --      Peak Flow --      Pain Score 07/09/20 1155 0     Pain Loc --      Pain Edu? --      Excl. in [place]? --    No data found.  Updated Vital Signs BP (!) 150/77 (BP Location: Right Arm)   Pulse 78   Temp 98.2 F (36.8 C) (Oral)   Resp 18   SpO2 100%   Visual Acuity Right Eye Distance:   Left Eye Distance:   Bilateral Distance:    Right Eye Near:   Left Eye Near:    Bilateral Near:     Physical Exam Vitals and nursing note reviewed.  Constitutional:      General: He is not in acute distress.    Appearance: Normal appearance. He is not ill-appearing, toxic-appearing or diaphoretic.  HENT:     Head: Normocephalic and atraumatic.     Nose: No congestion.     Mouth/Throat:     Pharynx: No oropharyngeal exudate.  Eyes:     Conjunctiva/sclera: Conjunctivae normal.  Cardiovascular:     Rate and Rhythm: Normal rate.     Pulses: Normal pulses.     Heart sounds: Normal heart sounds.  Pulmonary:     Effort: Pulmonary effort is normal.     Breath sounds: Normal breath sounds.  Abdominal:     General: Abdomen is flat.      Palpations: Abdomen is soft.  Musculoskeletal:        General: Normal range of motion.     Cervical back:  Normal range of motion and neck supple.  Skin:    General: Skin is warm and dry.  Neurological:     General: No focal deficit present.     Mental Status: He is alert and oriented to person, place, and time.  Psychiatric:        Mood and Affect: Mood normal.      UC Treatments / Results  Labs (all labs ordered are listed, but only abnormal results are displayed) Labs Reviewed - No data to display  EKG   Radiology No results found.  Procedures Procedures (including critical care time)  Medications Ordered in UC Medications - No data to display  Initial Impression / Assessment and Plan / UC Course  I have reviewed the triage vital signs and the nursing notes.  Pertinent labs & imaging results that were available during my care of the patient were reviewed by me and considered in my medical decision making (see chart for details).    Assessment negative for red flags or concerns.  Less likely viral URI with cough.  Tessalon Perles prescribed as needed for cough.  Offered to prescribe Zyrtec and Flonase for nasal congestion but patient declines at this time.  Declined COVID testing.  Discussed conservative symptom management as described below in discharge instructions.  Follow-up with primary care as needed.   Final Clinical Impressions(s) / UC Diagnoses   Final diagnoses:  Viral URI with cough     Discharge Instructions     You most likely have viral illness.   Take the Tessalon perles as needed for cough.   You can take Tylenol and/or Ibuprofen as needed for fever reduction and pain relief.   If you develop nasal or chest congestion, I would recommend Zyrtec-D and Flonase nasal spray daily.    For cough: honey 1/2 to 1 teaspoon (you can dilute the honey in water or another fluid).  You can use a humidifier for chest congestion and cough.  If you don't  have a humidifier, you can sit in the bathroom with the hot shower running.    For sore throat: try warm salt water gargles, cepacol lozenges, throat spray, warm tea or water with lemon/honey, popsicles or ice, or OTC cold relief medicine for throat discomfort.    For congestion: take a daily anti-histamine like Zyrtec, Claritin, and a oral decongestant to help with post nasal drip that may be irritating your throat.    It is important to stay hydrated: drink plenty of fluids (water, gatorade/powerade/pedialyte, juices, or teas) to keep your throat moisturized and help further relieve irritation/discomfort.   Return or go to the Emergency Department if symptoms worsen or do not improve in the next few days.      ED Prescriptions    Medication Sig Dispense Auth. Provider   benzonatate (TESSALON PERLES) 100 MG capsule Take 2 capsules (200 mg total) by mouth 3 (three) times daily as needed for cough. 20 capsule Pearson Forster, NP     PDMP not reviewed this encounter.   Pearson Forster, NP 07/09/20 1234

## 2020-07-09 NOTE — Discharge Instructions (Addendum)
You most likely have viral illness.   Take the Tessalon perles as needed for cough.   You can take Tylenol and/or Ibuprofen as needed for fever reduction and pain relief.   If you develop nasal or chest congestion, I would recommend Zyrtec-D and Flonase nasal spray daily.    For cough: honey 1/2 to 1 teaspoon (you can dilute the honey in water or another fluid).  You can use a humidifier for chest congestion and cough.  If you don't have a humidifier, you can sit in the bathroom with the hot shower running.    For sore throat: try warm salt water gargles, cepacol lozenges, throat spray, warm tea or water with lemon/honey, popsicles or ice, or OTC cold relief medicine for throat discomfort.    For congestion: take a daily anti-histamine like Zyrtec, Claritin, and a oral decongestant to help with post nasal drip that may be irritating your throat.    It is important to stay hydrated: drink plenty of fluids (water, gatorade/powerade/pedialyte, juices, or teas) to keep your throat moisturized and help further relieve irritation/discomfort.   Return or go to the Emergency Department if symptoms worsen or do not improve in the next few days.

## 2020-07-09 NOTE — ED Triage Notes (Signed)
Pt with c/o cough, runny nose since Tuesday of last week. Denies shob, fevers.

## 2020-07-11 ENCOUNTER — Other Ambulatory Visit: Payer: Self-pay

## 2020-07-11 ENCOUNTER — Telehealth (INDEPENDENT_AMBULATORY_CARE_PROVIDER_SITE_OTHER): Payer: Medicare Other | Admitting: Nurse Practitioner

## 2020-07-11 ENCOUNTER — Telehealth: Payer: Self-pay

## 2020-07-11 DIAGNOSIS — R0981 Nasal congestion: Secondary | ICD-10-CM | POA: Diagnosis not present

## 2020-07-11 DIAGNOSIS — R062 Wheezing: Secondary | ICD-10-CM

## 2020-07-11 LAB — POC COVID19 BINAXNOW: SARS Coronavirus 2 Ag: NEGATIVE

## 2020-07-11 LAB — POC INFLUENZA A&B (BINAX/QUICKVUE)
Influenza A, POC: NEGATIVE
Influenza B, POC: NEGATIVE

## 2020-07-11 MED ORDER — ALBUTEROL SULFATE HFA 108 (90 BASE) MCG/ACT IN AERS
2.0000 | INHALATION_SPRAY | Freq: Four times a day (QID) | RESPIRATORY_TRACT | 0 refills | Status: DC | PRN
Start: 2020-07-11 — End: 2020-07-24

## 2020-07-11 MED ORDER — AMOXICILLIN-POT CLAVULANATE 875-125 MG PO TABS: 1.0000 | ORAL_TABLET | Freq: Two times a day (BID) | ORAL | 0 refills | Status: AC

## 2020-07-11 NOTE — Patient Instructions (Signed)
Sinusitis, Adult Sinusitis is inflammation of your sinuses. Sinuses are hollow spaces in the bones around your face. Your sinuses are located:  Around your eyes.  In the middle of your forehead.  Behind your nose.  In your cheekbones. Mucus normally drains out of your sinuses. When your nasal tissues become inflamed or swollen, mucus can become trapped or blocked. This allows bacteria, viruses, and fungi to grow, which leads to infection. Most infections of the sinuses are caused by a virus. Sinusitis can develop quickly. It can last for up to 4 weeks (acute) or for more than 12 weeks (chronic). Sinusitis often develops after a cold. What are the causes? This condition is caused by anything that creates swelling in the sinuses or stops mucus from draining. This includes:  Allergies.  Asthma.  Infection from bacteria or viruses.  Deformities or blockages in your nose or sinuses.  Abnormal growths in the nose (nasal polyps).  Pollutants, such as chemicals or irritants in the air.  Infection from fungi (rare). What increases the risk? You are more likely to develop this condition if you:  Have a weak body defense system (immune system).  Do a lot of swimming or diving.  Overuse nasal sprays.  Smoke. What are the signs or symptoms? The main symptoms of this condition are pain and a feeling of pressure around the affected sinuses. Other symptoms include:  Stuffy nose or congestion.  Thick drainage from your nose.  Swelling and warmth over the affected sinuses.  Headache.  Upper toothache.  A cough that may get worse at night.  Extra mucus that collects in the throat or the back of the nose (postnasal drip).  Decreased sense of smell and taste.  Fatigue.  A fever.  Sore throat.  Bad breath. How is this diagnosed? This condition is diagnosed based on:  Your symptoms.  Your medical history.  A physical exam.  Tests to find out if your condition is  acute or chronic. This may include: ? Checking your nose for nasal polyps. ? Viewing your sinuses using a device that has a light (endoscope). ? Testing for allergies or bacteria. ? Imaging tests, such as an MRI or CT scan. In rare cases, a bone biopsy may be done to rule out more serious types of fungal sinus disease. How is this treated? Treatment for sinusitis depends on the cause and whether your condition is chronic or acute.  If caused by a virus, your symptoms should go away on their own within 10 days. You may be given medicines to relieve symptoms. They include: ? Medicines that shrink swollen nasal passages (topical intranasal decongestants). ? Medicines that treat allergies (antihistamines). ? A spray that eases inflammation of the nostrils (topical intranasal corticosteroids). ? Rinses that help get rid of thick mucus in your nose (nasal saline washes).  If caused by bacteria, your health care provider may recommend waiting to see if your symptoms improve. Most bacterial infections will get better without antibiotic medicine. You may be given antibiotics if you have: ? A severe infection. ? A weak immune system.  If caused by narrow nasal passages or nasal polyps, you may need to have surgery. Follow these instructions at home: Medicines  Take, use, or apply over-the-counter and prescription medicines only as told by your health care provider. These may include nasal sprays.  If you were prescribed an antibiotic medicine, take it as told by your health care provider. Do not stop taking the antibiotic even if you start   to feel better. Hydrate and humidify  Drink enough fluid to keep your urine pale yellow. Staying hydrated will help to thin your mucus.  Use a cool mist humidifier to keep the humidity level in your home above 50%.  Inhale steam for 10-15 minutes, 3-4 times a day, or as told by your health care provider. You can do this in the bathroom while a hot shower is  running.  Limit your exposure to cool or dry air.   Rest  Rest as much as possible.  Sleep with your head raised (elevated).  Make sure you get enough sleep each night. General instructions  Apply a warm, moist washcloth to your face 3-4 times a day or as told by your health care provider. This will help with discomfort.  Wash your hands often with soap and water to reduce your exposure to germs. If soap and water are not available, use hand sanitizer.  Do not smoke. Avoid being around people who are smoking (secondhand smoke).  Keep all follow-up visits as told by your health care provider. This is important.   Contact a health care provider if:  You have a fever.  Your symptoms get worse.  Your symptoms do not improve within 10 days. Get help right away if:  You have a severe headache.  You have persistent vomiting.  You have severe pain or swelling around your face or eyes.  You have vision problems.  You develop confusion.  Your neck is stiff.  You have trouble breathing. Summary  Sinusitis is soreness and inflammation of your sinuses. Sinuses are hollow spaces in the bones around your face.  This condition is caused by nasal tissues that become inflamed or swollen. The swelling traps or blocks the flow of mucus. This allows bacteria, viruses, and fungi to grow, which leads to infection.  If you were prescribed an antibiotic medicine, take it as told by your health care provider. Do not stop taking the antibiotic even if you start to feel better.  Keep all follow-up visits as told by your health care provider. This is important. This information is not intended to replace advice given to you by your health care provider. Make sure you discuss any questions you have with your health care provider. Document Revised: 07/20/2017 Document Reviewed: 07/20/2017 Elsevier Patient Education  2021 [employer]  

## 2020-07-11 NOTE — Telephone Encounter (Signed)
The pt's wife scheduled the pt a virtual appt.

## 2020-07-11 NOTE — Progress Notes (Signed)
This visit occurred during the SARS-CoV-2 public health emergency.  Safety protocols were in place, including screening questions prior to the visit, additional usage of staff PPE, and extensive cleaning of exam room while observing appropriate contact time as indicated for disinfecting solutions.  Subjective:     Patient ID: Shawn Fernandez , male    DOB: [birth year] , 75 y.o.   MRN: [id number]   Chief Complaint  Patient presents with  . Sinus Problem    HPI  Patient is here and seen outside for some sinus congestion. They were in [facility] recently had after that got sick. They also have a family that was sick. He is vaccinated. No fever. He does have cough, no chest pain, denies N/V, numbness or tingling. Shortness of breath only when coughing. He has tried OTC medication for some symptom relief.     Past Medical History:  Diagnosis Date  . Blood clot in abdominal vein    treated at [facility]  . Carotid artery disease ([place])   . Dyslipidemia   . FHx: heart disease   . History of blood transfusion 8-9 yrs ago  . HTN (hypertension)   . Peripheral vascular disease ([place])      Family History  Problem Relation Age of Onset  . Heart disease Brother   . Heart disease Mother   . Cancer Father        ? type     Current Outpatient Medications:  .  amLODipine (NORVASC) 10 MG tablet, TAKE 1 TABLET BY MOUTH EVERY DAY IN THE MORNING, Disp: 90 tablet, Rfl: 2 .  aspirin EC 81 MG tablet, Take 81 mg by mouth daily., Disp: , Rfl:  .  benzonatate (TESSALON PERLES) 100 MG capsule, Take 2 capsules (200 mg total) by mouth 3 (three) times daily as needed for cough., Disp: 20 capsule, Rfl: 0 .  Cholecalciferol (VITAMIN D3) 1000 units CAPS, Take 1 capsule by mouth daily., Disp: , Rfl:  .  cilostazol (PLETAL) 50 MG tablet, Take 50 mg by mouth every morning., Disp: , Rfl:  .  clopidogrel (PLAVIX) 75 MG tablet, TAKE 1 TABLET BY MOUTH EVERY DAY, Disp: 90 tablet, Rfl: 1 .  hydrALAZINE (APRESOLINE) 25 MG tablet,  Take 1 tablet (25 mg total) by mouth 2 (two) times daily., Disp: 60 tablet, Rfl: 11 .  iron polysaccharides (NIFEREX) 150 MG capsule, Take 1 capsule (150 mg total) by mouth daily., Disp: 30 capsule, Rfl: 5 .  levothyroxine (SYNTHROID) 125 MCG tablet, Take 1 tablet (125 mcg total) by mouth daily. (Patient taking differently: Take 125 mcg by mouth daily. Monday - Saturday), Disp: 30 tablet, Rfl: 11 .  Multiple Vitamin (MULTIVITAMIN WITH MINERALS) TABS tablet, Take 1 tablet by mouth daily., Disp: , Rfl:  .  pantoprazole (PROTONIX) 40 MG tablet, TAKE 1 TABLET BY MOUTH EVERY DAY, Disp: 90 tablet, Rfl: 2 .  simvastatin (ZOCOR) 20 MG tablet, TAKE 1 TABLET BY MOUTH EVERY DAY IN THE EVENING, Disp: 90 tablet, Rfl: 1 .  valsartan (DIOVAN) 160 MG tablet, TAKE 1 TABLET BY MOUTH EVERY DAY, Disp: 90 tablet, Rfl: 1   Allergies  Allergen Reactions  . Lisinopril Cough     Review of Systems  Constitutional: Negative for chills and fever.  HENT: Positive for congestion, rhinorrhea and sinus pain. Negative for ear pain.   Respiratory: Positive for cough. Negative for chest tightness, shortness of breath and wheezing.   Cardiovascular: Negative for chest pain and palpitations.  Gastrointestinal: Negative for diarrhea, nausea  and vomiting.  Musculoskeletal: Negative for arthralgias.  Neurological: Negative for headaches.     Today's Vitals   There is no height or weight on file to calculate BMI.   Objective:  Physical Exam Constitutional:      Appearance: Normal appearance.  HENT:     Head: Normocephalic and atraumatic.     Nose: Nose normal.  Cardiovascular:     Rate and Rhythm: Normal rate and regular rhythm.     Pulses: Normal pulses.     Heart sounds: Normal heart sounds. No murmur heard.   Pulmonary:     Effort: Pulmonary effort is normal. No respiratory distress.     Breath sounds: Normal breath sounds. No wheezing.  Skin:    General: Skin is warm.     Capillary Refill: Capillary refill  takes less than 2 seconds.  Neurological:     Mental Status: He is alert and oriented to person, place, and time.  Psychiatric:        Mood and Affect: Mood normal.        Behavior: Behavior normal.         Assessment And Plan:     1. Sinus congestion - POC COVID-19- Neg  - POC Influenza A&B ([facility] test) - Novel Coronavirus, NAA ([facility])  -Uncertain if it is COVID. Waiting on PCR results.  -Will go ahead treat with antx due to age and comorbidities.  -Continue taking OTC pain relievers, cough syrup (Delsym) -Take OTC antihistamines as needed. -Continue taking Tessalon pears as needed for cough.   -Advised patient if he develops any chest pain, shortness of breath to go to the nearest hospital.  -Augmentin x 7 days BID  -Advised patient to take Vitamin C, D, Zinc. Keep yourself hydrated with a lot of water and rest. Take Delsym for cough and Mucinex. Take Tylenol or pain reliever every 4-6 hours as needed for pain/fever/body ache. Educated patient if symptoms get worse or if she experiences any SOB or pain in her legs to seek immediate emergency care. Continue to monitor your pulse oxygen. Call us if you have any questions. Quarantine for 5 days if tested positive and no symptoms or 10 days if tested positive and have symptoms. Wear a mask around other people.  referral to [facility] if symptoms do not get better or PCR comes back positive for covid.   2) Wheezing  -Rx sent for albuterol as needed for shortness of breath or wheezing as needed.  -albuterol inhaler   Patient was given opportunity to ask questions. Patient verbalized understanding of the plan and was able to repeat key elements of the plan. All questions were answered to their satisfaction.  Raman Azula Zappia, DNP   I, Debbe Mounts, DNP have reviewed all documentation for this visit. The documentation on 07/11/20 for the exam, diagnosis, procedures, and orders are all accurate and complete.   Follow up: If symptoms do  not get better or get worse.   THE PATIENT IS ENCOURAGED TO PRACTICE SOCIAL DISTANCING DUE TO THE COVID-19 PANDEMIC.

## 2020-07-12 LAB — NOVEL CORONAVIRUS, NAA: SARS-CoV-2, NAA: NOT DETECTED

## 2020-07-12 LAB — SARS-COV-2, NAA 2 DAY TAT

## 2020-07-24 ENCOUNTER — Other Ambulatory Visit: Payer: Self-pay | Admitting: Nurse Practitioner

## 2020-07-24 DIAGNOSIS — R062 Wheezing: Secondary | ICD-10-CM

## 2020-08-01 ENCOUNTER — Encounter: Payer: Self-pay | Admitting: Internal Medicine

## 2020-08-01 ENCOUNTER — Other Ambulatory Visit: Payer: Self-pay

## 2020-08-01 ENCOUNTER — Ambulatory Visit (INDEPENDENT_AMBULATORY_CARE_PROVIDER_SITE_OTHER): Payer: Medicare Other | Admitting: Internal Medicine

## 2020-08-01 VITALS — BP 120/68 | HR 81 | Temp 98.1°F | Ht 68.0 in | Wt 141.0 lb

## 2020-08-01 DIAGNOSIS — I739 Peripheral vascular disease, unspecified: Secondary | ICD-10-CM | POA: Diagnosis not present

## 2020-08-01 DIAGNOSIS — I10 Essential (primary) hypertension: Secondary | ICD-10-CM | POA: Diagnosis not present

## 2020-08-01 DIAGNOSIS — E039 Hypothyroidism, unspecified: Secondary | ICD-10-CM

## 2020-08-01 NOTE — Patient Instructions (Signed)

## 2020-08-01 NOTE — Progress Notes (Signed)
I,Katawbba Wiggins,acting as a Education administrator for [name], MD.,have documented all relevant documentation on the behalf of [name], MD,as directed by  [name], MD while in the presence of [name], MD. This visit occurred during the SARS-CoV-2 public health emergency.  Safety protocols were in place, including screening questions prior to the visit, additional usage of staff PPE, and extensive cleaning of exam room while observing appropriate contact time as indicated for disinfecting solutions.  Subjective:     Patient ID: Shawn Fernandez , male    DOB: 1945-05-07 , 75 y.o.   MRN: [id number]   Chief Complaint  Patient presents with   Hypertension    HPI  He presents today for HTN.  He reports compliance with meds.  He denies headaches, chest pain and shortness of breath.   Hypertension This is a chronic problem. The current episode started more than 1 year ago. The problem has been gradually improving since onset. The problem is controlled. Pertinent negatives include no blurred vision, chest pain, headaches, palpitations or shortness of breath. Risk factors for coronary artery disease include male gender and dyslipidemia.    Past Medical History:  Diagnosis Date   Blood clot in abdominal vein    treated at [facility]   Carotid artery disease ([facility])    Dyslipidemia    FHx: heart disease    History of blood transfusion 8-9 yrs ago   HTN (hypertension)    Peripheral vascular disease ([facility])      Family History  Problem Relation Age of Onset   Heart disease Brother    Heart disease Mother    Cancer Father        ? type     Current Outpatient Medications:    albuterol (VENTOLIN HFA) 108 (90 Base) MCG/ACT inhaler, TAKE 2 PUFFS BY MOUTH EVERY 6 HOURS AS NEEDED FOR WHEEZE OR SHORTNESS OF BREATH, Disp: 8.5 each, Rfl: 1   amLODipine (NORVASC) 10 MG tablet, TAKE 1 TABLET BY MOUTH EVERY DAY IN THE MORNING, Disp: 90 tablet, Rfl: 2   aspirin EC 81 MG tablet, Take 81  mg by mouth daily., Disp: , Rfl:    Cholecalciferol (VITAMIN D3) 1000 units CAPS, Take 1 capsule by mouth daily., Disp: , Rfl:    cilostazol (PLETAL) 50 MG tablet, Take 50 mg by mouth every morning., Disp: , Rfl:    clopidogrel (PLAVIX) 75 MG tablet, TAKE 1 TABLET BY MOUTH EVERY DAY, Disp: 90 tablet, Rfl: 1   hydrALAZINE (APRESOLINE) 25 MG tablet, Take 1 tablet (25 mg total) by mouth 2 (two) times daily., Disp: 60 tablet, Rfl: 11   iron polysaccharides (NIFEREX) 150 MG capsule, Take 1 capsule (150 mg total) by mouth daily., Disp: 30 capsule, Rfl: 5   levothyroxine (SYNTHROID) 125 MCG tablet, Take 1 tablet (125 mcg total) by mouth daily. (Patient taking differently: Take 125 mcg by mouth daily. Monday - Saturday), Disp: 30 tablet, Rfl: 11   Multiple Vitamin (MULTIVITAMIN WITH MINERALS) TABS tablet, Take 1 tablet by mouth daily., Disp: , Rfl:    pantoprazole (PROTONIX) 40 MG tablet, TAKE 1 TABLET BY MOUTH EVERY DAY, Disp: 90 tablet, Rfl: 2   simvastatin (ZOCOR) 20 MG tablet, TAKE 1 TABLET BY MOUTH EVERY DAY IN THE EVENING, Disp: 90 tablet, Rfl: 1   valsartan (DIOVAN) 160 MG tablet, TAKE 1 TABLET BY MOUTH EVERY DAY, Disp: 90 tablet, Rfl: 1   benzonatate (TESSALON PERLES) 100 MG capsule, Take 2 capsules (200 mg total)  by mouth 3 (three) times daily as needed for cough. (Patient not taking: Reported on 08/01/2020), Disp: 20 capsule, Rfl: 0   Allergies  Allergen Reactions   Lisinopril Cough     Review of Systems  Constitutional: Negative.   Eyes:  Negative for blurred vision.  Respiratory: Negative.  Negative for shortness of breath.   Cardiovascular: Negative.  Negative for chest pain and palpitations.  Gastrointestinal: Negative.   Neurological:  Negative for headaches.  Psychiatric/Behavioral: Negative.    All other systems reviewed and are negative.   Today's Vitals   08/01/20 1149  BP: 120/68  Pulse: 81  Temp: 98.1 F (36.7 C)  TempSrc: Oral  Weight: 141 lb (64 kg)  Height: '5\' 8"'   (1.727 m)  PainSc: 0-No pain   Body mass index is 21.44 kg/m.  Wt Readings from Last 3 Encounters:  08/01/20 141 lb (64 kg)  07/03/20 145 lb 6.4 oz (66 kg)  06/18/20 148 lb 9.6 oz (67.4 kg)   BP Readings from Last 3 Encounters:  08/01/20 120/68  07/09/20 (!) 150/77  07/03/20 (!) 152/80   Objective:  Physical Exam Vitals and nursing note reviewed.  Constitutional:      Appearance: Normal appearance.  HENT:     Head: Normocephalic and atraumatic.     Nose:     Comments: Masked     Mouth/Throat:     Comments: Masked  Cardiovascular:     Rate and Rhythm: Normal rate and regular rhythm.     Heart sounds: Normal heart sounds.  Pulmonary:     Effort: Pulmonary effort is normal.     Breath sounds: Normal breath sounds.  Musculoskeletal:     Cervical back: Normal range of motion.  Skin:    General: Skin is warm.  Neurological:     General: No focal deficit present.     Mental Status: He is alert.  Psychiatric:        Mood and Affect: Mood normal.        Assessment And Plan:     1. Essential hypertension Comments: Chronic, well controlled. I will check renal function today. He is encouraged to follow low sodium diet.  - CMP14+EGFR  2. Acquired hypothyroidism Comments: I will check thyroid panel and adjust meds as needed.  - TSH  3. Peripheral arterial disease ([place]) Comments: Chronic, he is on appropriate therapy. He is encouraged to exercise at least 30 minutes five days per week.    Patient was given opportunity to ask questions. Patient verbalized understanding of the plan and was able to repeat key elements of the plan. All questions were answered to their satisfaction.   I, [name], MD, have reviewed all documentation for this visit. The documentation on 08/18/20 for the exam, diagnosis, procedures, and orders are all accurate and complete.   IF YOU HAVE BEEN REFERRED TO A SPECIALIST, IT MAY TAKE 1-2 WEEKS TO SCHEDULE/PROCESS THE REFERRAL. IF YOU HAVE  NOT HEARD FROM US/SPECIALIST IN TWO WEEKS, PLEASE GIVE [place] A CALL AT [phone].   THE PATIENT IS ENCOURAGED TO PRACTICE SOCIAL DISTANCING DUE TO THE COVID-19 PANDEMIC.

## 2020-08-09 ENCOUNTER — Ambulatory Visit: Payer: Medicare Other | Admitting: Hematology

## 2020-08-09 ENCOUNTER — Other Ambulatory Visit: Payer: Medicare Other

## 2020-08-14 LAB — TSH: TSH: 51.6 u[IU]/mL — ABNORMAL HIGH (ref 0.450–4.500)

## 2020-08-14 LAB — CMP14+EGFR
ALT: 10 IU/L (ref 0–44)
AST: 44 IU/L — ABNORMAL HIGH (ref 0–40)
Albumin/Globulin Ratio: 1.9 (ref 1.2–2.2)
Albumin: 4.4 g/dL (ref 3.7–4.7)
Alkaline Phosphatase: 100 IU/L (ref 44–121)
BUN/Creatinine Ratio: 13 (ref 10–24)
BUN: 14 mg/dL (ref 8–27)
Bilirubin Total: 0.4 mg/dL (ref 0.0–1.2)
CO2: 21 mmol/L (ref 20–29)
Calcium: 9.1 mg/dL (ref 8.6–10.2)
Chloride: 107 mmol/L — ABNORMAL HIGH (ref 96–106)
Creatinine, Ser: 1.11 mg/dL (ref 0.76–1.27)
Globulin, Total: 2.3 g/dL (ref 1.5–4.5)
Glucose: 86 mg/dL (ref 65–99)
Potassium: 4.4 mmol/L (ref 3.5–5.2)
Sodium: 142 mmol/L (ref 134–144)
Total Protein: 6.7 g/dL (ref 6.0–8.5)
eGFR: 69 mL/min/{1.73_m2} (ref >59)

## 2020-08-27 ENCOUNTER — Ambulatory Visit (INDEPENDENT_AMBULATORY_CARE_PROVIDER_SITE_OTHER): Payer: Medicare Other | Admitting: Internal Medicine

## 2020-08-27 ENCOUNTER — Encounter: Payer: Self-pay | Admitting: Internal Medicine

## 2020-08-27 ENCOUNTER — Other Ambulatory Visit: Payer: Self-pay

## 2020-08-27 VITALS — BP 152/70 | HR 72 | Temp 98.7°F | Ht 68.0 in | Wt 147.6 lb

## 2020-08-27 DIAGNOSIS — R5383 Other fatigue: Secondary | ICD-10-CM

## 2020-08-27 DIAGNOSIS — D500 Iron deficiency anemia secondary to blood loss (chronic): Secondary | ICD-10-CM

## 2020-08-27 DIAGNOSIS — I10 Essential (primary) hypertension: Secondary | ICD-10-CM

## 2020-08-27 DIAGNOSIS — E039 Hypothyroidism, unspecified: Secondary | ICD-10-CM

## 2020-08-27 DIAGNOSIS — Z79899 Other long term (current) drug therapy: Secondary | ICD-10-CM

## 2020-08-27 NOTE — Progress Notes (Signed)
I,Katawbba Wiggins,acting as a Education administrator for [name], MD.,have documented all relevant documentation on the behalf of [name], MD,as directed by  [name], MD while in the presence of [name], MD.  This visit occurred during the SARS-CoV-2 public health emergency.  Safety protocols were in place, including screening questions prior to the visit, additional usage of staff PPE, and extensive cleaning of exam room while observing appropriate contact time as indicated for disinfecting solutions.  Subjective:     Patient ID: Shawn Fernandez , male    DOB: [birth year] , 75 y.o.   MRN: [id number]   Chief Complaint  Patient presents with   Fatigue    HPI  The patient is here today for evaluation of fatigue.  He is accompanied by his wife. She and other family members are concerned b/c of his behaviors. States he appears more listless - not as active as usual. He denies appetite and sleep changes. He is dismissive of wife's concerns. He states he feels fine - denies chest pain, shortness of breath and palpitations.     Past Medical History:  Diagnosis Date   Blood clot in abdominal vein    treated at [facility]   Carotid artery disease ([facility])    Dyslipidemia    FHx: heart disease    History of blood transfusion 8-9 yrs ago   HTN (hypertension)    Peripheral vascular disease ([facility])      Family History  Problem Relation Age of Onset   Heart disease Brother    Heart disease Mother    Cancer Father        ? type     Current Outpatient Medications:    albuterol (VENTOLIN HFA) 108 (90 Base) MCG/ACT inhaler, TAKE 2 PUFFS BY MOUTH EVERY 6 HOURS AS NEEDED FOR WHEEZE OR SHORTNESS OF BREATH, Disp: 8.5 each, Rfl: 1   amLODipine (NORVASC) 10 MG tablet, TAKE 1 TABLET BY MOUTH EVERY DAY IN THE MORNING, Disp: 90 tablet, Rfl: 2   aspirin EC 81 MG tablet, Take 81 mg by mouth daily., Disp: , Rfl:    Cholecalciferol (VITAMIN D3) 1000 units CAPS, Take 1 capsule by mouth daily.,  Disp: , Rfl:    clopidogrel (PLAVIX) 75 MG tablet, TAKE 1 TABLET BY MOUTH EVERY DAY, Disp: 90 tablet, Rfl: 1   hydrALAZINE (APRESOLINE) 25 MG tablet, Take 1 tablet (25 mg total) by mouth 2 (two) times daily., Disp: 60 tablet, Rfl: 11   levothyroxine (SYNTHROID) 125 MCG tablet, Take 1 tablet (125 mcg total) by mouth daily. (Patient taking differently: Take 125 mcg by mouth daily. Monday - Saturday), Disp: 30 tablet, Rfl: 11   Multiple Vitamin (MULTIVITAMIN WITH MINERALS) TABS tablet, Take 1 tablet by mouth daily., Disp: , Rfl:    pantoprazole (PROTONIX) 40 MG tablet, TAKE 1 TABLET BY MOUTH EVERY DAY, Disp: 90 tablet, Rfl: 2   simvastatin (ZOCOR) 20 MG tablet, TAKE 1 TABLET BY MOUTH EVERY DAY IN THE EVENING, Disp: 90 tablet, Rfl: 1   valsartan (DIOVAN) 160 MG tablet, TAKE 1 TABLET BY MOUTH EVERY DAY, Disp: 90 tablet, Rfl: 1   benzonatate (TESSALON PERLES) 100 MG capsule, Take 2 capsules (200 mg total) by mouth 3 (three) times daily as needed for cough. (Patient not taking: No sig reported), Disp: 20 capsule, Rfl: 0   iron polysaccharides (NIFEREX) 150 MG capsule, Take 1 capsule (150 mg total) by mouth daily., Disp: 30 capsule, Rfl: 5   Allergies  Allergen Reactions  Lisinopril Cough     Review of Systems  Constitutional: Negative.   Respiratory: Negative.    Cardiovascular: Negative.   Gastrointestinal: Negative.   Psychiatric/Behavioral: Negative.    All other systems reviewed and are negative.   Today's Vitals   08/27/20 1602  BP: (!) 152/70  Pulse: 72  Temp: 98.7 F (37.1 C)  TempSrc: Oral  Weight: 147 lb 10.1 oz (67 kg)  Height: 5\' 8"  (1.727 m)  PainSc: 0-No pain   Body mass index is 22.45 kg/m.  Wt Readings from Last 3 Encounters:  09/18/20 140 lb 12.8 oz (63.9 kg)  09/06/20 143 lb 1.6 oz (64.9 kg)  08/27/20 147 lb 10.1 oz (67 kg)    BP Readings from Last 3 Encounters:  09/18/20 132/80  09/06/20 (!) 162/67  08/27/20 (!) 152/70    Objective:  Physical Exam Vitals  and nursing note reviewed.  Constitutional:      Appearance: Normal appearance.  HENT:     Head: Normocephalic and atraumatic.     Nose:     Comments: Masked     Mouth/Throat:     Comments: Masked  Cardiovascular:     Rate and Rhythm: Normal rate and regular rhythm.     Heart sounds: Normal heart sounds.  Pulmonary:     Effort: Pulmonary effort is normal.     Breath sounds: Normal breath sounds.  Musculoskeletal:     Cervical back: Normal range of motion.  Skin:    General: Skin is warm.  Neurological:     General: No focal deficit present.     Mental Status: He is alert.  Psychiatric:        Mood and Affect: Mood normal.        Assessment And Plan:     1. Fatigue, unspecified type Comments: Likely related to suboptimal thyroid control. I will check an EKG due to cardiac risk factors as well. I will also check CBC and iron levels. He is also encouraged to stay well hydrated.  - EKG 12-Lead  2. Acquired hypothyroidism Comments: TSH was 51 on 08/23/20. Again, importance of medication compliance was d/w pt. Advised to take MVI w/ lunch. I will increase Synthroid to 121mcg daily.  3. Essential hypertension Comments: Uncontrolled. I will increase hydralazine 25mg  to three times per day. Encouraged to follow low sodium diet. He will f/u in 2 wks for nurse visit, 6 wks w/ me.   4. Iron deficiency anemia due to chronic blood loss Comments: Chronic, I will check CBC. He denies melena, BRBPR and gingival bleeding.Has had GI bleeds in the past. I will contact his cardiologist to assess need for both Plavix and cilostazol.  - CBC no Diff - Vitamin B12 - Iron and IBC ([id number]) - Ferritin  5. Drug therapy    Patient was given opportunity to ask questions. Patient verbalized understanding of the plan and was able to repeat key elements of the plan. All questions were answered to their satisfaction.   I, [name], MD, have reviewed all documentation for this visit.  The documentation on 08/27/20 for the exam, diagnosis, procedures, and orders are all accurate and complete.   IF YOU HAVE BEEN REFERRED TO A SPECIALIST, IT MAY TAKE 1-2 WEEKS TO SCHEDULE/PROCESS THE REFERRAL. IF YOU HAVE NOT HEARD FROM US/SPECIALIST IN TWO WEEKS, PLEASE GIVE [place] A CALL AT [phone].   THE PATIENT IS ENCOURAGED TO PRACTICE SOCIAL DISTANCING DUE TO THE COVID-19 PANDEMIC.

## 2020-08-28 ENCOUNTER — Other Ambulatory Visit: Payer: Self-pay | Admitting: Internal Medicine

## 2020-09-05 ENCOUNTER — Encounter: Payer: Self-pay | Admitting: Internal Medicine

## 2020-09-05 NOTE — Progress Notes (Signed)
HEMATOLOGY/ONCOLOGY CLINIC NOTE  Date of Service: 09/06/2020  Patient Care Team: [name], MD as PCP - General (Internal Medicine) Lorretta Harp, MD as PCP - Cardiology (Cardiology) Lorretta Harp, MD as Consulting Physician (Cardiology)   Endocrinologist: Dr. Jacelyn Pi  CHIEF COMPLAINTS/PURPOSE OF CONSULTATION:  F/u for IDA  DIAGNOSIS  Microcytic hypochromic Anemia with MCV 79.4 due to Iron deficiency. Ferritin 27, Iron sat too low to calculate. No overt evidence of bleeding. Patient notes recent fecal occult blood neg x 1. He has had significant iron deficiency anemia in the past in 2011 with a ferritin level of 9 and required a blood transfusion. EGD showed duodenitis and hiatal hernia. Patient remains at high risk of GI bleeding since he is on ASA+ plavix for his endovascular stent graft for repaired AAA and is on Cilostazole for PAD. Some element of anemia also appears to be temporally related to methimazole use (though agranulocytosis is the usual concern and isolated anemia is less usual) The hyperthyroidism itself can also contribute to the anemia until corrected. SPEP with no M spike No evidence of hemolysis B12/folate WNL  Current treatment -Iron polysaccharide 150mg  po daily for iron maintenance replacement -previously IV feraheme. Continue prn IV feraheme.  HISTORY OF PRESENTING ILLNESS: plz see initial consultation for details on initial presentation   INTERVAL HISTORY: Shawn Fernandez is a 75 y.o. male here for evaluation and management of anemia. The patient's last visit with [place] was on 02/08/2020. The pt reports that he is doing well overall.  The pt reports that he has been doing well over the last six months. He has been taking his iron and blood thinners just as with the last visit. He denies any bleeding issues or blood in his stools. The pt notes his thyroid levels were low and he is on Synthroid replacement (25 mcg) TID.  Lab results  today 09/06/2020 of CBC w/diff and CMP is as follows: all values are WNL except for WBC of 3.6K, RBC of 3.18, Hgb of 7.6, HCT of 25.8, MCH of 23.9, MCHC of 29.5, RDW of 17.2, Neutro Abs of 1.6K. 09/06/2020 Iron Sat of 2. 09/06/2020 Ferritin pending.  On review of systems, pt denies lightheadedness, fatigue, dizziness, bloody/black stools, abdominal pain, leg swelling, and any other symptoms.  MEDICAL HISTORY:   Past Medical History:  Diagnosis Date   Blood clot in abdominal vein    treated at [facility]   Carotid artery disease ([place])    Dyslipidemia    FHx: heart disease    History of blood transfusion 8-9 yrs ago   HTN (hypertension)    Peripheral vascular disease ([place])    Hyperthyroidism -being followed by Dr. Chalmers Cater - was previously on methimazole for 6-8 months and has been off for a few weeks now. He was treated with radioactive iodine about one week ago.  He had presented with progressive weight loss from 2 years prior to diagnosis.  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  Previous history of iron deficiency anemia ferritin was 9 about 5 years ago in 2011  SURGICAL HISTORY: Past Surgical History:  Procedure Laterality Date   ANKLE SURGERY Right yrs ago   arm surgery Left yrs ago   2 rods inserted, 1 rod later removed   COLONOSCOPY WITH PROPOFOL N/A 09/16/2013   Procedure: COLONOSCOPY WITH PROPOFOL;  Surgeon: Beryle Beams, MD;  Location: [facility] ENDOSCOPY;  Service: Endoscopy;  Laterality: N/A;  LOWER EXTREMITY ANGIOGRAM N/A 07/24/2011   Procedure: LOWER EXTREMITY ANGIOGRAM;  Surgeon: Lorretta Harp, MD;  Location: [facility] CATH LAB;  Service: Cardiovascular;  Laterality: N/A;   stent in abdominal vein  4-5 yrs ago   US ECHOCARDIOGRAPHY  07-20-2007   EF 55-60%    SOCIAL HISTORY: Social History   Socioeconomic History   Marital status: Married    Spouse name: Not on file   Number of children: Not on file   Years of  education: Not on file   Highest education level: Not on file  Occupational History   Occupation: retired  Tobacco Use   Smoking status: Former    Packs/day: 0.75    Years: 20.00    Pack years: 15.00    Types: Cigarettes    Quit date: 07/01/2009    Years since quitting: 11.1   Smokeless tobacco: Never  Vaping Use   Vaping Use: Never used  Substance and Sexual Activity   Alcohol use: Yes    Comment: only occ   Drug use: No   Sexual activity: Yes  Other Topics Concern   Not on file  Social History Narrative   Not on file   Social Determinants of Health   Financial Resource Strain: Low Risk    Difficulty of Paying Living Expenses: Not hard at all  Food Insecurity: No Food Insecurity   Worried About Charity fundraiser in the Last Year: Never true   [place] in the Last Year: Never true  Transportation Needs: No Transportation Needs   Lack of Transportation (Medical): No   Lack of Transportation (Non-Medical): No  Physical Activity: Sufficiently Active   Days of Exercise per Week: 7 days   Minutes of Exercise per Session: 30 min  Stress: No Stress Concern Present   Feeling of Stress : Not at all  Social Connections: Not on file  Intimate Partner Violence: Not on file    FAMILY HISTORY: Family History  Problem Relation Age of Onset   Heart disease Brother    Heart disease Mother    Cancer Father        ? type    ALLERGIES:  is allergic to lisinopril.  MEDICATIONS:  Current Outpatient Medications  Medication Sig Dispense Refill   albuterol (VENTOLIN HFA) 108 (90 Base) MCG/ACT inhaler TAKE 2 PUFFS BY MOUTH EVERY 6 HOURS AS NEEDED FOR WHEEZE OR SHORTNESS OF BREATH 8.5 each 1   amLODipine (NORVASC) 10 MG tablet TAKE 1 TABLET BY MOUTH EVERY DAY IN THE MORNING 90 tablet 2   aspirin EC 81 MG tablet Take 81 mg by mouth daily.     benzonatate (TESSALON PERLES) 100 MG capsule Take 2 capsules (200 mg total) by mouth 3 (three) times daily as needed for cough. (Patient  not taking: No sig reported) 20 capsule 0   Cholecalciferol (VITAMIN D3) 1000 units CAPS Take 1 capsule by mouth daily.     cilostazol (PLETAL) 50 MG tablet Take 50 mg by mouth every morning.     clopidogrel (PLAVIX) 75 MG tablet TAKE 1 TABLET BY MOUTH EVERY DAY 90 tablet 1   hydrALAZINE (APRESOLINE) 25 MG tablet Take 1 tablet (25 mg total) by mouth 2 (two) times daily. 60 tablet 11   iron polysaccharides (NIFEREX) 150 MG capsule Take 1 capsule (150 mg total) by mouth daily. 30 capsule 5   levothyroxine (SYNTHROID) 125 MCG tablet Take 1 tablet (125 mcg total) by mouth daily. (Patient taking differently: Take 125  mcg by mouth daily. Monday - Saturday) 30 tablet 11   Multiple Vitamin (MULTIVITAMIN WITH MINERALS) TABS tablet Take 1 tablet by mouth daily.     pantoprazole (PROTONIX) 40 MG tablet TAKE 1 TABLET BY MOUTH EVERY DAY 90 tablet 2   simvastatin (ZOCOR) 20 MG tablet TAKE 1 TABLET BY MOUTH EVERY DAY IN THE EVENING 90 tablet 1   valsartan (DIOVAN) 160 MG tablet TAKE 1 TABLET BY MOUTH EVERY DAY 90 tablet 1   No current facility-administered medications for this visit.    REVIEW OF SYSTEMS:   10 Point review of Systems was done is negative except as noted above.  PHYSICAL EXAMINATION: ECOG FS:2 - Symptomatic, <50% confined to bed  There were no vitals filed for this visit.  Wt Readings from Last 3 Encounters:  08/27/20 147 lb 10.1 oz (67 kg)  08/01/20 141 lb (64 kg)  07/03/20 145 lb 6.4 oz (66 kg)   There is no height or weight on file to calculate BMI.     GENERAL:alert, in no acute distress and comfortable SKIN: no acute rashes, no significant lesions EYES: conjunctiva are pink and non-injected, sclera anicteric OROPHARYNX: MMM, no exudates, no oropharyngeal erythema or ulceration NECK: supple, no JVD LYMPH:  no palpable lymphadenopathy in the cervical, axillary or inguinal regions LUNGS: clear to auscultation b/l with normal respiratory effort HEART: regular rate &  rhythm ABDOMEN:  normoactive bowel sounds , non tender, not distended. No palpable hepatosplenomegaly.  Extremity: no pedal edema PSYCH: alert & oriented x 3 with fluent speech NEURO: no focal motor/sensory deficits  LABORATORY DATA:  I have reviewed the data as listed  . CBC Latest Ref Rng & Units 02/08/2020 01/09/2020 08/09/2019  WBC 4.0 - 10.5 K/uL 4.6 5.7 5.7  Hemoglobin 13.0 - 17.0 g/dL 13.4 11.8(L) 12.3(L)  Hematocrit 39.0 - 52.0 % 43.1 36.7(L) 39.9  Platelets 150 - 400 K/uL 211 229 177   . CBC    Component Value Date/Time   WBC 4.6 02/08/2020 1336   RBC 4.73 02/08/2020 1336   HGB 13.4 02/08/2020 1336   HGB 11.8 (L) 01/09/2020 1453   HGB 13.9 03/21/2016 0919   HCT 43.1 02/08/2020 1336   HCT 36.7 (L) 01/09/2020 1453   HCT 43.1 03/21/2016 0919   PLT 211 02/08/2020 1336   PLT 229 01/09/2020 1453   MCV 91.1 02/08/2020 1336   MCV 89 01/09/2020 1453   MCV 89.2 03/21/2016 0919   MCH 28.3 02/08/2020 1336   MCHC 31.1 02/08/2020 1336   RDW 13.1 02/08/2020 1336   RDW 11.8 01/09/2020 1453   RDW 13.1 03/21/2016 0919   LYMPHSABS 1.5 02/08/2020 1336   LYMPHSABS 2.1 11/03/2018 1626   LYMPHSABS 2.5 03/21/2016 0919   MONOABS 0.5 02/08/2020 1336   MONOABS 0.8 03/21/2016 0919   EOSABS 0.2 02/08/2020 1336   EOSABS 0.2 11/03/2018 1626   BASOSABS 0.1 02/08/2020 1336   BASOSABS 0.1 11/03/2018 1626   BASOSABS 0.1 03/21/2016 0919    CMP Latest Ref Rng & Units 08/13/2020 02/08/2020 01/09/2020  Glucose 65 - 99 mg/dL 86 89 81  BUN 8 - 27 mg/dL 14 11 12   Creatinine 0.76 - 1.27 mg/dL 1.11 1.07 1.08  Sodium 134 - 144 mmol/L 142 143 142  Potassium 3.5 - 5.2 mmol/L 4.4 4.1 4.0  Chloride 96 - 106 mmol/L 107(H) 109 106  CO2 20 - 29 mmol/L 21 26 22   Calcium 8.6 - 10.2 mg/dL 9.1 9.8 9.2  Total Protein 6.0 - 8.5 g/dL  6.7 8.5(H) 7.0  Total Bilirubin 0.0 - 1.2 mg/dL 0.4 0.7 0.5  Alkaline Phos 44 - 121 IU/L 100 104 104  AST 0 - 40 IU/L 44(H) 22 23  ALT 0 - 44 IU/L 10 6 11    . Lab Results   Component Value Date   IRON 407 (H) 02/08/2020   TIBC 405 02/08/2020   IRONPCTSAT 100 (H) 02/08/2020   (Iron and TIBC)  Lab Results  Component Value Date   FERRITIN 24 02/08/2020   . Lab Results  Component Value Date   IRON 407 (H) 02/08/2020   TIBC 405 02/08/2020   IRONPCTSAT 100 (H) 02/08/2020   (Iron and TIBC)  Lab Results  Component Value Date   FERRITIN 24 02/08/2020    RADIOGRAPHIC STUDIES: I have personally reviewed the radiological images as listed and agreed with the findings in the report. No results found.   ASSESSMENT & PLAN:   75 yo AAM with   1) Microcytic hypochromic Anemia due to Iron deficiency - now resolved.  Previously had Ferritin 27, Iron sat too low to calculate. No overt evidence of bleeding. Patient notes recent fecal occult blood neg x 1. He has had significant iron deficiency anemia in the past in 2011 with a ferritin level of 9 and required a blood transfusion. EGD showed duodenitis and hiatal hernia. Patient remains at high risk of GI bleeding since he is on ASA+ plavix for his endovascular stent graft for repaired AAA and is on Cilostazole for PAD. Some element of anemia also appears to be temporally related to methimazole use (though agranulocytosis is the usual concern and isolated anemia is less usual) The hyperthyroidism itself can also contribute to the anemia until corrected. SPEP with no M spike No evidence of hemolysis B12/folate WNL   PLAN: -Discussed pt labwork today, 09/06/2020; anemic again and blood counts dropping, chemistries completely normal. Iron labs pending. -Recommended pt discuss management of blood thinners with PCP ASAP due to active blood loss from GI tract. -Advised pt it seems he is still actively losing blood from the GI tract and becoming very anemic. This is not rapid, but it does continue to cause symptomatic anemia. -Will set pt up for IV Iron ASAP. -Would prefer to keep Ferritin >= 100  -Advised pt  that if he starts getting lightheaded or dizzy, he needs to go to the ED for a blood transfusion. -Recommended pt f/u w GI regarding continued GI bleeding. -Refill Iron Polysaccharide. -Will see back in 8 weeks with labs.   FOLLOW UP: IV Injectafer weekly x 2 ASAP Labs in 4 weeks RTC with Dr Irene Limbo with labs in 8 weeks   The total time spent in the appt was 30 minutes and more than 50% was on counseling and direct patient cares.  All of the patient's questions were answered with apparent satisfaction. The patient knows to call the clinic with any problems, questions or concerns.    Sullivan Lone MD [place] [facility] Hematology/Oncology Physician [facility]  (Office):       [phone] (Work cell):  [phone] (Fax):           [phone]  I, Reinaldo Raddle, am acting as scribe for Dr. Sullivan Lone, MD.   .I have reviewed the above documentation for accuracy and completeness, and I agree with the above. Brunetta Genera MD

## 2020-09-06 ENCOUNTER — Other Ambulatory Visit: Payer: Self-pay | Admitting: Internal Medicine

## 2020-09-06 ENCOUNTER — Inpatient Hospital Stay (HOSPITAL_BASED_OUTPATIENT_CLINIC_OR_DEPARTMENT_OTHER): Payer: Medicare Other | Admitting: Hematology

## 2020-09-06 ENCOUNTER — Inpatient Hospital Stay: Payer: Medicare Other | Attending: Hematology

## 2020-09-06 ENCOUNTER — Other Ambulatory Visit: Payer: Self-pay

## 2020-09-06 VITALS — BP 162/67 | HR 82 | Temp 98.0°F | Resp 19 | Ht 68.0 in | Wt 143.1 lb

## 2020-09-06 DIAGNOSIS — Z79899 Other long term (current) drug therapy: Secondary | ICD-10-CM | POA: Insufficient documentation

## 2020-09-06 DIAGNOSIS — Z809 Family history of malignant neoplasm, unspecified: Secondary | ICD-10-CM | POA: Diagnosis not present

## 2020-09-06 DIAGNOSIS — D500 Iron deficiency anemia secondary to blood loss (chronic): Secondary | ICD-10-CM | POA: Diagnosis not present

## 2020-09-06 DIAGNOSIS — E0590 Thyrotoxicosis, unspecified without thyrotoxic crisis or storm: Secondary | ICD-10-CM | POA: Diagnosis not present

## 2020-09-06 DIAGNOSIS — I10 Essential (primary) hypertension: Secondary | ICD-10-CM | POA: Diagnosis not present

## 2020-09-06 DIAGNOSIS — Z87891 Personal history of nicotine dependence: Secondary | ICD-10-CM | POA: Diagnosis not present

## 2020-09-06 DIAGNOSIS — Z8249 Family history of ischemic heart disease and other diseases of the circulatory system: Secondary | ICD-10-CM | POA: Insufficient documentation

## 2020-09-06 DIAGNOSIS — D509 Iron deficiency anemia, unspecified: Secondary | ICD-10-CM | POA: Insufficient documentation

## 2020-09-06 DIAGNOSIS — I714 Abdominal aortic aneurysm, without rupture: Secondary | ICD-10-CM | POA: Insufficient documentation

## 2020-09-06 LAB — CMP (CANCER CENTER ONLY)
ALT: <6 U/L (ref 0–44)
AST: 18 U/L (ref 15–41)
Albumin: 3.6 g/dL (ref 3.5–5.0)
Alkaline Phosphatase: 104 U/L (ref 38–126)
Anion gap: 8 (ref 5–15)
BUN: 11 mg/dL (ref 8–23)
CO2: 24 mmol/L (ref 22–32)
Calcium: 9.4 mg/dL (ref 8.9–10.3)
Chloride: 110 mmol/L (ref 98–111)
Creatinine: 0.94 mg/dL (ref 0.61–1.24)
GFR, Estimated: >60 mL/min (ref >60)
Glucose, Bld: 95 mg/dL (ref 70–99)
Potassium: 3.9 mmol/L (ref 3.5–5.1)
Sodium: 142 mmol/L (ref 135–145)
Total Bilirubin: 0.6 mg/dL (ref 0.3–1.2)
Total Protein: 7.3 g/dL (ref 6.5–8.1)

## 2020-09-06 LAB — FERRITIN: Ferritin: 11 ng/mL — ABNORMAL LOW (ref 24–336)

## 2020-09-06 LAB — CBC WITH DIFFERENTIAL/PLATELET
Abs Immature Granulocytes: 0.01 10*3/uL (ref 0.00–0.07)
Basophils Absolute: 0.1 10*3/uL (ref 0.0–0.1)
Basophils Relative: 3 %
Eosinophils Absolute: 0.2 10*3/uL (ref 0.0–0.5)
Eosinophils Relative: 5 %
HCT: 25.8 % — ABNORMAL LOW (ref 39.0–52.0)
Hemoglobin: 7.6 g/dL — ABNORMAL LOW (ref 13.0–17.0)
Immature Granulocytes: 0 %
Lymphocytes Relative: 33 %
Lymphs Abs: 1.2 10*3/uL (ref 0.7–4.0)
MCH: 23.9 pg — ABNORMAL LOW (ref 26.0–34.0)
MCHC: 29.5 g/dL — ABNORMAL LOW (ref 30.0–36.0)
MCV: 81.1 fL (ref 80.0–100.0)
Monocytes Absolute: 0.5 10*3/uL (ref 0.1–1.0)
Monocytes Relative: 13 %
Neutro Abs: 1.6 10*3/uL — ABNORMAL LOW (ref 1.7–7.7)
Neutrophils Relative %: 46 %
Platelets: 283 10*3/uL (ref 150–400)
RBC: 3.18 MIL/uL — ABNORMAL LOW (ref 4.22–5.81)
RDW: 17.2 % — ABNORMAL HIGH (ref 11.5–15.5)
WBC: 3.6 10*3/uL — ABNORMAL LOW (ref 4.0–10.5)
nRBC: 0 % (ref 0.0–0.2)

## 2020-09-06 LAB — IRON AND TIBC
Iron: 8 ug/dL — ABNORMAL LOW (ref 42–163)
Saturation Ratios: 2 % — ABNORMAL LOW (ref 20–55)
TIBC: 390 ug/dL (ref 202–409)
UIBC: 382 ug/dL — ABNORMAL HIGH (ref 117–376)

## 2020-09-10 ENCOUNTER — Telehealth: Payer: Self-pay | Admitting: Hematology

## 2020-09-10 NOTE — Telephone Encounter (Signed)
Left message with follow-up appointments per 7/7 los. 

## 2020-09-11 ENCOUNTER — Ambulatory Visit: Payer: Medicare Other

## 2020-09-11 ENCOUNTER — Telehealth: Payer: Self-pay | Admitting: Hematology

## 2020-09-11 NOTE — Telephone Encounter (Signed)
Left message with follow-up appointments per 7/7 los. 

## 2020-09-12 ENCOUNTER — Encounter: Payer: Self-pay | Admitting: Hematology

## 2020-09-12 MED ORDER — POLYSACCHARIDE IRON COMPLEX 150 MG PO CAPS: 150.0000 mg | ORAL_CAPSULE | Freq: Every day | ORAL | 5 refills | Status: DC

## 2020-09-18 ENCOUNTER — Telehealth: Payer: Self-pay

## 2020-09-18 ENCOUNTER — Other Ambulatory Visit: Payer: Self-pay

## 2020-09-18 ENCOUNTER — Ambulatory Visit: Payer: Medicare Other

## 2020-09-18 VITALS — BP 132/80 | HR 80 | Temp 98.5°F | Ht 68.0 in | Wt 140.8 lb

## 2020-09-18 DIAGNOSIS — I10 Essential (primary) hypertension: Secondary | ICD-10-CM

## 2020-09-18 NOTE — Telephone Encounter (Signed)
error 

## 2020-09-18 NOTE — Progress Notes (Signed)
Pt is here today for a BPC. He did take medicine before arriving today. Amlodipine 10mg  is what he currently takes for BP.  BP Readings from Last 3 Encounters:  09/18/20 132/80  09/06/20 (!) 162/67  08/27/20 (!) 152/70  Provider stated BP is much better, continue taking meds as directed.

## 2020-09-25 ENCOUNTER — Telehealth: Payer: Self-pay | Admitting: Hematology

## 2020-09-25 ENCOUNTER — Other Ambulatory Visit: Payer: Self-pay | Admitting: Hematology

## 2020-09-25 ENCOUNTER — Other Ambulatory Visit: Payer: Self-pay

## 2020-09-25 DIAGNOSIS — D500 Iron deficiency anemia secondary to blood loss (chronic): Secondary | ICD-10-CM

## 2020-09-25 NOTE — Telephone Encounter (Signed)
Scheduled appts per 7/26 sch msg. Pt aware.

## 2020-09-25 NOTE — Progress Notes (Signed)
Contacted pt to discuss pt coming in for 2 units of blood on 09/26/20. Pt agreeable. Pt acknowledged date and time. Message sent to scheduling .

## 2020-09-26 ENCOUNTER — Other Ambulatory Visit: Payer: Self-pay

## 2020-09-26 ENCOUNTER — Inpatient Hospital Stay: Payer: Medicare Other

## 2020-09-26 ENCOUNTER — Other Ambulatory Visit: Payer: Medicare Other

## 2020-09-26 DIAGNOSIS — D500 Iron deficiency anemia secondary to blood loss (chronic): Secondary | ICD-10-CM

## 2020-09-26 DIAGNOSIS — D509 Iron deficiency anemia, unspecified: Secondary | ICD-10-CM | POA: Diagnosis not present

## 2020-09-26 LAB — CBC WITH DIFFERENTIAL (CANCER CENTER ONLY)
Abs Immature Granulocytes: 0.02 10*3/uL (ref 0.00–0.07)
Basophils Absolute: 0.1 10*3/uL (ref 0.0–0.1)
Basophils Relative: 2 %
Eosinophils Absolute: 0.2 10*3/uL (ref 0.0–0.5)
Eosinophils Relative: 3 %
HCT: 23.3 % — ABNORMAL LOW (ref 39.0–52.0)
Hemoglobin: 6.7 g/dL — CL (ref 13.0–17.0)
Immature Granulocytes: 0 %
Lymphocytes Relative: 16 %
Lymphs Abs: 1 10*3/uL (ref 0.7–4.0)
MCH: 21.3 pg — ABNORMAL LOW (ref 26.0–34.0)
MCHC: 28.8 g/dL — ABNORMAL LOW (ref 30.0–36.0)
MCV: 74.2 fL — ABNORMAL LOW (ref 80.0–100.0)
Monocytes Absolute: 0.6 10*3/uL (ref 0.1–1.0)
Monocytes Relative: 10 %
Neutro Abs: 4.3 10*3/uL (ref 1.7–7.7)
Neutrophils Relative %: 69 %
Platelet Count: 369 10*3/uL (ref 150–400)
RBC: 3.14 MIL/uL — ABNORMAL LOW (ref 4.22–5.81)
RDW: 19.2 % — ABNORMAL HIGH (ref 11.5–15.5)
WBC Count: 6.3 10*3/uL (ref 4.0–10.5)
nRBC: 0 % (ref 0.0–0.2)

## 2020-09-26 LAB — CMP (CANCER CENTER ONLY)
ALT: <6 U/L (ref 0–44)
AST: 16 U/L (ref 15–41)
Albumin: 3.5 g/dL (ref 3.5–5.0)
Alkaline Phosphatase: 130 U/L — ABNORMAL HIGH (ref 38–126)
Anion gap: 8 (ref 5–15)
BUN: 13 mg/dL (ref 8–23)
CO2: 25 mmol/L (ref 22–32)
Calcium: 9.6 mg/dL (ref 8.9–10.3)
Chloride: 109 mmol/L (ref 98–111)
Creatinine: 0.85 mg/dL (ref 0.61–1.24)
GFR, Estimated: >60 mL/min (ref >60)
Glucose, Bld: 96 mg/dL (ref 70–99)
Potassium: 4.1 mmol/L (ref 3.5–5.1)
Sodium: 142 mmol/L (ref 135–145)
Total Bilirubin: 0.4 mg/dL (ref 0.3–1.2)
Total Protein: 7.5 g/dL (ref 6.5–8.1)

## 2020-09-26 LAB — PREPARE RBC (CROSSMATCH)

## 2020-09-26 MED ORDER — METHYLPREDNISOLONE SODIUM SUCC 40 MG IJ SOLR
40.0000 mg | Freq: Once | INTRAMUSCULAR | Status: AC
  Administered 2020-09-26: 40 mg via INTRAVENOUS

## 2020-09-26 MED ORDER — METHYLPREDNISOLONE SODIUM SUCC 40 MG IJ SOLR
INTRAMUSCULAR | Status: AC
  Filled 2020-09-26: qty 1

## 2020-09-26 MED ORDER — ACETAMINOPHEN 325 MG PO TABS
ORAL_TABLET | ORAL | Status: AC
  Filled 2020-09-26: qty 2

## 2020-09-26 MED ORDER — SODIUM CHLORIDE 0.9% IV SOLUTION
250.0000 mL | Freq: Once | INTRAVENOUS | Status: AC
  Administered 2020-09-26: 250 mL via INTRAVENOUS
  Filled 2020-09-26: qty 250

## 2020-09-26 MED ORDER — ACETAMINOPHEN 325 MG PO TABS
650.0000 mg | ORAL_TABLET | Freq: Once | ORAL | Status: AC
  Administered 2020-09-26: 650 mg via ORAL

## 2020-09-26 NOTE — Patient Instructions (Signed)
Blood Transfusion, Adult, Care After This sheet gives you information about how to care for yourself after your procedure. Your doctor may also give you more specific instructions. If youhave problems or questions, contact your doctor. What can I expect after the procedure? After the procedure, it is common to have: Bruising and soreness at the IV site. A fever or chills on the day of the procedure. This may be your body's response to the new blood cells received. A headache. Follow these instructions at home: Insertion site care     Follow instructions from your doctor about how to take care of your insertion site. This is where an IV tube was put into your vein. Make sure you: Wash your hands with soap and water before and after you change your bandage (dressing). If you cannot use soap and water, use hand sanitizer. Change your bandage as told by your doctor. Check your insertion site every day for signs of infection. Check for: Redness, swelling, or pain. Bleeding from the site. Warmth. Pus or a bad smell. General instructions Take over-the-counter and prescription medicines only as told by your doctor. Rest as told by your doctor. Go back to your normal activities as told by your doctor. Keep all follow-up visits as told by your doctor. This is important. Contact a doctor if: You have itching or red, swollen areas of skin (hives). You feel worried or nervous (anxious). You feel weak after doing your normal activities. You have redness, swelling, warmth, or pain around the insertion site. You have blood coming from the insertion site, and the blood does not stop with pressure. You have pus or a bad smell coming from the insertion site. Get help right away if: You have signs of a serious reaction. This may be coming from an allergy or the body's defense system (immune system). Signs include: Trouble breathing or shortness of breath. Swelling of the face or feeling warm  (flushed). Fever or chills. Head, chest, or back pain. Dark pee (urine) or blood in the pee. Widespread rash. Fast heartbeat. Feeling dizzy or light-headed. You may receive your blood transfusion in an outpatient setting. If so, youwill be told whom to contact to report any reactions. These symptoms may be an emergency. Do not wait to see if the symptoms will go away. Get medical help right away. Call your local emergency services (911 in the [place] Do not drive yourself to the hospital. Summary Bruising and soreness at the IV site are common. Check your insertion site every day for signs of infection. Rest as told by your doctor. Go back to your normal activities as told by your doctor. Get help right away if you have signs of a serious reaction. This information is not intended to replace advice given to you by your health care provider. Make sure you discuss any questions you have with your healthcare provider. Document Revised: 08/12/2018 Document Reviewed: 08/12/2018 Elsevier Patient Education  2022 Elsevier Inc.  

## 2020-09-27 LAB — TYPE AND SCREEN
ABO/RH(D): O POS
Antibody Screen: NEGATIVE
Unit division: 0
Unit division: 0

## 2020-09-27 LAB — BPAM RBC
Blood Product Expiration Date: 202208292359
Blood Product Expiration Date: 202208292359
ISSUE DATE / TIME: 202207271246
ISSUE DATE / TIME: 202207271246
Unit Type and Rh: 5100
Unit Type and Rh: 5100

## 2020-10-04 ENCOUNTER — Other Ambulatory Visit: Payer: Self-pay

## 2020-10-04 ENCOUNTER — Other Ambulatory Visit: Payer: Medicare Other

## 2020-10-04 ENCOUNTER — Other Ambulatory Visit (HOSPITAL_COMMUNITY): Payer: Self-pay | Admitting: Cardiovascular Disease

## 2020-10-04 ENCOUNTER — Inpatient Hospital Stay: Payer: Medicare Other | Attending: Hematology

## 2020-10-04 ENCOUNTER — Ambulatory Visit (HOSPITAL_COMMUNITY)
Admission: RE | Admit: 2020-10-04 | Discharge: 2020-10-04 | Disposition: A | Discharge status: Home / Self Care | Payer: Medicare Other | Source: Ambulatory Visit | Attending: Cardiology | Admitting: Cardiology

## 2020-10-04 ENCOUNTER — Inpatient Hospital Stay: Payer: Medicare Other

## 2020-10-04 ENCOUNTER — Ambulatory Visit: Payer: Medicare Other | Admitting: Internal Medicine

## 2020-10-04 VITALS — BP 122/49 | HR 66 | Temp 98.0°F | Resp 18

## 2020-10-04 DIAGNOSIS — E785 Hyperlipidemia, unspecified: Secondary | ICD-10-CM | POA: Diagnosis not present

## 2020-10-04 DIAGNOSIS — I6523 Occlusion and stenosis of bilateral carotid arteries: Secondary | ICD-10-CM

## 2020-10-04 DIAGNOSIS — R0609 Other forms of dyspnea: Secondary | ICD-10-CM | POA: Diagnosis not present

## 2020-10-04 DIAGNOSIS — K2980 Duodenitis without bleeding: Secondary | ICD-10-CM | POA: Insufficient documentation

## 2020-10-04 DIAGNOSIS — K449 Diaphragmatic hernia without obstruction or gangrene: Secondary | ICD-10-CM | POA: Diagnosis not present

## 2020-10-04 DIAGNOSIS — R5383 Other fatigue: Secondary | ICD-10-CM | POA: Insufficient documentation

## 2020-10-04 DIAGNOSIS — D509 Iron deficiency anemia, unspecified: Secondary | ICD-10-CM | POA: Insufficient documentation

## 2020-10-04 DIAGNOSIS — Z87891 Personal history of nicotine dependence: Secondary | ICD-10-CM | POA: Diagnosis not present

## 2020-10-04 DIAGNOSIS — Z79899 Other long term (current) drug therapy: Secondary | ICD-10-CM | POA: Diagnosis not present

## 2020-10-04 DIAGNOSIS — D500 Iron deficiency anemia secondary to blood loss (chronic): Secondary | ICD-10-CM

## 2020-10-04 DIAGNOSIS — I714 Abdominal aortic aneurysm, without rupture: Secondary | ICD-10-CM | POA: Diagnosis not present

## 2020-10-04 LAB — CBC WITH DIFFERENTIAL (CANCER CENTER ONLY)
Abs Immature Granulocytes: 0.01 10*3/uL (ref 0.00–0.07)
Basophils Absolute: 0.1 10*3/uL (ref 0.0–0.1)
Basophils Relative: 2 %
Eosinophils Absolute: 0.2 10*3/uL (ref 0.0–0.5)
Eosinophils Relative: 4 %
HCT: 27.4 % — ABNORMAL LOW (ref 39.0–52.0)
Hemoglobin: 8.3 g/dL — ABNORMAL LOW (ref 13.0–17.0)
Immature Granulocytes: 0 %
Lymphocytes Relative: 23 %
Lymphs Abs: 1.5 10*3/uL (ref 0.7–4.0)
MCH: 23.2 pg — ABNORMAL LOW (ref 26.0–34.0)
MCHC: 30.3 g/dL (ref 30.0–36.0)
MCV: 76.8 fL — ABNORMAL LOW (ref 80.0–100.0)
Monocytes Absolute: 0.8 10*3/uL (ref 0.1–1.0)
Monocytes Relative: 12 %
Neutro Abs: 4 10*3/uL (ref 1.7–7.7)
Neutrophils Relative %: 59 %
Platelet Count: 415 10*3/uL — ABNORMAL HIGH (ref 150–400)
RBC: 3.57 MIL/uL — ABNORMAL LOW (ref 4.22–5.81)
RDW: 21.5 % — ABNORMAL HIGH (ref 11.5–15.5)
WBC Count: 6.6 10*3/uL (ref 4.0–10.5)
nRBC: 0 % (ref 0.0–0.2)

## 2020-10-04 LAB — SAMPLE TO BLOOD BANK

## 2020-10-04 MED ORDER — ACETAMINOPHEN 325 MG PO TABS
650.0000 mg | ORAL_TABLET | Freq: Once | ORAL | Status: AC
  Administered 2020-10-04: 650 mg via ORAL

## 2020-10-04 MED ORDER — LORATADINE 10 MG PO TABS
10.0000 mg | ORAL_TABLET | Freq: Once | ORAL | Status: AC
  Administered 2020-10-04: 10 mg via ORAL

## 2020-10-04 MED ORDER — LORATADINE 10 MG PO TABS
ORAL_TABLET | ORAL | Status: AC
  Filled 2020-10-04: qty 1

## 2020-10-04 MED ORDER — ACETAMINOPHEN 325 MG PO TABS
ORAL_TABLET | ORAL | Status: AC
  Filled 2020-10-04: qty 2

## 2020-10-04 MED ORDER — SODIUM CHLORIDE 0.9 % IV SOLN
750.0000 mg | Freq: Once | INTRAVENOUS | Status: AC
  Administered 2020-10-04: 750 mg via INTRAVENOUS
  Filled 2020-10-04: qty 15

## 2020-10-04 MED ORDER — SODIUM CHLORIDE 0.9 % IV SOLN
INTRAVENOUS | Status: DC
  Filled 2020-10-04: qty 250

## 2020-10-04 NOTE — Patient Instructions (Signed)
[facility] ONCOLOGY  Discharge Instructions: Thank you for choosing [facility] to provide your oncology and hematology care.   If you have a lab appointment with the [facility], please go directly to the [facility] and check in at the registration area.   Wear comfortable clothing and clothing appropriate for easy access to any Portacath or PICC line.   We strive to give you quality time with your provider. You may need to reschedule your appointment if you arrive late (15 or more minutes).  Arriving late affects you and other patients whose appointments are after yours.  Also, if you miss three or more appointments without notifying the office, you may be dismissed from the clinic at the provider's discretion.      For prescription refill requests, have your pharmacy contact our office and allow 72 hours for refills to be completed.    Today you received the following chemotherapy and/or immunotherapy agents Iron      To help prevent nausea and vomiting after your treatment, we encourage you to take your nausea medication as directed.  BELOW ARE SYMPTOMS THAT SHOULD BE REPORTED IMMEDIATELY: *FEVER GREATER THAN 100.4 F (38 C) OR HIGHER *CHILLS OR SWEATING *NAUSEA AND VOMITING THAT IS NOT CONTROLLED WITH YOUR NAUSEA MEDICATION *UNUSUAL SHORTNESS OF BREATH *UNUSUAL BRUISING OR BLEEDING *URINARY PROBLEMS (pain or burning when urinating, or frequent urination) *BOWEL PROBLEMS (unusual diarrhea, constipation, pain near the anus) TENDERNESS IN MOUTH AND THROAT WITH OR WITHOUT PRESENCE OF ULCERS (sore throat, sores in mouth, or a toothache) UNUSUAL RASH, SWELLING OR PAIN  UNUSUAL VAGINAL DISCHARGE OR ITCHING   Items with * indicate a potential emergency and should be followed up as soon as possible or go to the Emergency Department if any problems should occur.  Please show the CHEMOTHERAPY ALERT CARD or IMMUNOTHERAPY ALERT CARD at check-in to the  Emergency Department and triage nurse.  Should you have questions after your visit or need to cancel or reschedule your appointment, please contact [facility]  Dept: [phone]  and follow the prompts.  Office hours are 8:00 a.m. to 4:30 p.m. Monday - Friday. Please note that voicemails left after 4:00 p.m. may not be returned until the following business day.  We are closed weekends and major holidays. You have access to a nurse at all times for urgent questions. Please call the main number to the clinic Dept: [phone] and follow the prompts.   For any non-urgent questions, you may also contact your provider using MyChart. We now offer e-Visits for anyone 41 and older to request care online for non-urgent symptoms. For details visit [email or web address].   Also download the MyChart app! Go to the app store, search "MyChart", open the app, select [facility], and log in with your MyChart username and password.  Due to Covid, a mask is required upon entering the hospital/clinic. If you do not have a mask, one will be given to you upon arrival. For doctor visits, patients may have 1 support person aged 48 or older with them. For treatment visits, patients cannot have anyone with them due to current Covid guidelines and our immunocompromised population.  Ferric carboxymaltose injection What is this medication? FERRIC CARBOXYMALTOSE (FER ik car BOX ee MAWL tose) treats anemia in people with chronic kidney disease or people who cannot take iron by mouth. It works by increasing iron in your body which helps red blood cells deliver oxygen fromthe lungs to  cells all over the body. This medicine may be used for other purposes; ask your health care provider orpharmacist if you have questions. COMMON BRAND NAME(S): Injectafer What should I tell my care team before I take this medication? They need to know if you have any of these conditions: High blood pressure High  levels of iron in the blood An unusual or allergic reaction to iron, other medications, foods, dyes, or preservatives Pregnant or trying to get pregnant Breast-feeding How should I use this medication? This medication is injected into a vein. It is given by your care team in [place] or clinic setting. Talk to your care team about the use of this medication in children. While it may be given to children as young as 1 year for selected conditions,precautions do apply. Overdosage: If you think you have taken too much of this medicine contact apoison control center or emergency room at once. NOTE: This medicine is only for you. Do not share this medicine with others. What if I miss a dose? Keep appointments for follow-up doses. It is important not to miss your dose.Call your care team if you are unable to keep an appointment. What may interact with this medication? Do not take this medication with any of the following medications: Deferoxamine Dimercaprol Other iron products This list may not describe all possible interactions. Give your health care provider a list of all the medicines, herbs, non-prescription drugs, or dietary supplements you use. Also tell them if you smoke, drink alcohol, or use illegaldrugs. Some items may interact with your medicine. What should I watch for while using this medication? Visit your care team for regular checks on your progress. Tell your care teamif your symptoms do not start to get better or if they get worse. You may need blood work while you are taking this medication. You may need to eat more foods that contain iron. Talk to your care team. Foods that contain iron include whole grains/cereals, dried fruits, beans, or peas,leafy green vegetables, and organ meats (liver, kidney). What side effects may I notice from receiving this medication? Side effects that you should report to your care team as soon as possible: Allergic reactions-skin rash, itching,  hives, swelling of the face, lips, tongue, or throat Dizziness Facial flushing, redness Increase in blood pressure Low phosphorous levels-loss of appetite, muscle weakness Side effects that usually do not require medical attention (report to your careteam if they continue or are bothersome): Change in taste Constipation Headache Nausea Pain, redness, or irritation at injection site Vomiting This list may not describe all possible side effects. Call your doctor for medical advice about side effects. You may report side effects to FDA at1-800-FDA-1088. Where should I keep my medication? This medication is given in a hospital or clinic. It will not be stored at home. NOTE: This sheet is a summary. It may not cover all possible information. If you have questions about this medicine, talk to your doctor, pharmacist, orhealth care provider.  2022 Elsevier/Gold Standard (2020-03-16 12:26:19)

## 2020-10-05 ENCOUNTER — Encounter: Payer: Self-pay | Admitting: *Deleted

## 2020-10-11 ENCOUNTER — Other Ambulatory Visit: Payer: Self-pay

## 2020-10-11 ENCOUNTER — Inpatient Hospital Stay: Payer: Medicare Other

## 2020-10-11 VITALS — BP 156/65 | HR 60 | Temp 97.8°F | Resp 16

## 2020-10-11 DIAGNOSIS — D509 Iron deficiency anemia, unspecified: Secondary | ICD-10-CM | POA: Diagnosis not present

## 2020-10-11 DIAGNOSIS — D500 Iron deficiency anemia secondary to blood loss (chronic): Secondary | ICD-10-CM

## 2020-10-11 MED ORDER — LORATADINE 10 MG PO TABS
10.0000 mg | ORAL_TABLET | Freq: Once | ORAL | Status: AC
  Administered 2020-10-11: 10 mg via ORAL
  Filled 2020-10-11: qty 1

## 2020-10-11 MED ORDER — ACETAMINOPHEN 325 MG PO TABS
650.0000 mg | ORAL_TABLET | Freq: Once | ORAL | Status: AC
  Administered 2020-10-11: 650 mg via ORAL
  Filled 2020-10-11: qty 2

## 2020-10-11 MED ORDER — FERRIC CARBOXYMALTOSE 750 MG/15ML IV SOLN
750.0000 mg | Freq: Once | INTRAVENOUS | Status: AC
  Administered 2020-10-11: 750 mg via INTRAVENOUS
  Filled 2020-10-11: qty 15

## 2020-10-11 MED ORDER — SODIUM CHLORIDE 0.9 % IV SOLN
INTRAVENOUS | Status: DC
  Filled 2020-10-11: qty 250

## 2020-10-11 NOTE — Patient Instructions (Signed)
Ferric carboxymaltose injection What is this medication? FERRIC CARBOXYMALTOSE (FER ik car BOX ee MAWL tose) treats anemia in people with chronic kidney disease or people who cannot take iron by mouth. It works by increasing iron in your body which helps red blood cells deliver oxygen fromthe lungs to cells all over the body. This medicine may be used for other purposes; ask your health care provider orpharmacist if you have questions. COMMON BRAND NAME(S): Injectafer What should I tell my care team before I take this medication? They need to know if you have any of these conditions: High blood pressure High levels of iron in the blood An unusual or allergic reaction to iron, other medications, foods, dyes, or preservatives Pregnant or trying to get pregnant Breast-feeding How should I use this medication? This medication is injected into a vein. It is given by your care team in ahospital or clinic setting. Talk to your care team about the use of this medication in children. While it may be given to children as young as 1 year for selected conditions,precautions do apply. Overdosage: If you think you have taken too much of this medicine contact apoison control center or emergency room at once. NOTE: This medicine is only for you. Do not share this medicine with others. What if I miss a dose? Keep appointments for follow-up doses. It is important not to miss your dose.Call your care team if you are unable to keep an appointment. What may interact with this medication? Do not take this medication with any of the following medications: Deferoxamine Dimercaprol Other iron products This list may not describe all possible interactions. Give your health care provider a list of all the medicines, herbs, non-prescription drugs, or dietary supplements you use. Also tell them if you smoke, drink alcohol, or use illegaldrugs. Some items may interact with your medicine. What should I watch for while  using this medication? Visit your care team for regular checks on your progress. Tell your care teamif your symptoms do not start to get better or if they get worse. You may need blood work while you are taking this medication. You may need to eat more foods that contain iron. Talk to your care team. Foods that contain iron include whole grains/cereals, dried fruits, beans, or peas,leafy green vegetables, and organ meats (liver, kidney). What side effects may I notice from receiving this medication? Side effects that you should report to your care team as soon as possible: Allergic reactions-skin rash, itching, hives, swelling of the face, lips, tongue, or throat Dizziness Facial flushing, redness Increase in blood pressure Low phosphorous levels-loss of appetite, muscle weakness Side effects that usually do not require medical attention (report to your careteam if they continue or are bothersome): Change in taste Constipation Headache Nausea Pain, redness, or irritation at injection site Vomiting This list may not describe all possible side effects. Call your doctor for medical advice about side effects. You may report side effects to FDA at1-800-FDA-1088. Where should I keep my medication? This medication is given in a hospital or clinic. It will not be stored at home. NOTE: This sheet is a summary. It may not cover all possible information. If you have questions about this medicine, talk to your doctor, pharmacist, orhealth care provider.  2022 Elsevier/Gold Standard (2020-03-16 12:26:19)  

## 2020-10-18 ENCOUNTER — Telehealth: Payer: Self-pay | Admitting: Cardiovascular Disease

## 2020-10-18 NOTE — Telephone Encounter (Signed)
Wife of the patient called. She got a letter about the patient's Doppler results and wanted to speak to [facility] regarding these results.  Please call the patient's wife. The patient was with the wife at the time of the call and gave me permission to speak with her

## 2020-10-18 NOTE — Telephone Encounter (Signed)
Per Dr Gwenlyn Found notation  Gwenlyn Found, Pearletha Forge, MD  Mild progression of carotid artery stenosis.  Repeat 12 months    RN  spoke with patient and wife. Patient gave verbal consent to to speak with wife Shawn Fernandez.  RN discuss the finding with both.   No action needed at present time. Will need to be revaluated annual. Both verbalized understanding.

## 2020-10-23 ENCOUNTER — Other Ambulatory Visit: Payer: Self-pay | Admitting: Internal Medicine

## 2020-10-23 DIAGNOSIS — I10 Essential (primary) hypertension: Secondary | ICD-10-CM

## 2020-10-28 ENCOUNTER — Other Ambulatory Visit: Payer: Self-pay | Admitting: Internal Medicine

## 2020-10-30 ENCOUNTER — Other Ambulatory Visit: Payer: Self-pay

## 2020-10-30 DIAGNOSIS — D500 Iron deficiency anemia secondary to blood loss (chronic): Secondary | ICD-10-CM

## 2020-10-31 ENCOUNTER — Inpatient Hospital Stay: Payer: Medicare Other

## 2020-10-31 ENCOUNTER — Inpatient Hospital Stay (HOSPITAL_BASED_OUTPATIENT_CLINIC_OR_DEPARTMENT_OTHER): Payer: Medicare Other | Admitting: Hematology

## 2020-10-31 ENCOUNTER — Other Ambulatory Visit: Payer: Self-pay

## 2020-10-31 VITALS — BP 168/77 | HR 56 | Temp 98.6°F | Resp 18 | Wt 142.9 lb

## 2020-10-31 DIAGNOSIS — D500 Iron deficiency anemia secondary to blood loss (chronic): Secondary | ICD-10-CM | POA: Diagnosis not present

## 2020-10-31 DIAGNOSIS — D509 Iron deficiency anemia, unspecified: Secondary | ICD-10-CM | POA: Diagnosis not present

## 2020-10-31 LAB — CBC WITH DIFFERENTIAL (CANCER CENTER ONLY)
Abs Immature Granulocytes: 0.01 10*3/uL (ref 0.00–0.07)
Basophils Absolute: 0.1 10*3/uL (ref 0.0–0.1)
Basophils Relative: 2 %
Eosinophils Absolute: 0.2 10*3/uL (ref 0.0–0.5)
Eosinophils Relative: 3 %
HCT: 41 % (ref 39.0–52.0)
Hemoglobin: 12.3 g/dL — ABNORMAL LOW (ref 13.0–17.0)
Immature Granulocytes: 0 %
Lymphocytes Relative: 25 %
Lymphs Abs: 1.4 10*3/uL (ref 0.7–4.0)
MCH: 25.8 pg — ABNORMAL LOW (ref 26.0–34.0)
MCHC: 30 g/dL (ref 30.0–36.0)
MCV: 86 fL (ref 80.0–100.0)
Monocytes Absolute: 0.4 10*3/uL (ref 0.1–1.0)
Monocytes Relative: 8 %
Neutro Abs: 3.6 10*3/uL (ref 1.7–7.7)
Neutrophils Relative %: 62 %
Platelet Count: 193 10*3/uL (ref 150–400)
RBC: 4.77 MIL/uL (ref 4.22–5.81)
RDW: 22.7 % — ABNORMAL HIGH (ref 11.5–15.5)
WBC Count: 5.7 10*3/uL (ref 4.0–10.5)
nRBC: 0 % (ref 0.0–0.2)

## 2020-10-31 LAB — CMP (CANCER CENTER ONLY)
ALT: 11 U/L (ref 0–44)
AST: 22 U/L (ref 15–41)
Albumin: 3.9 g/dL (ref 3.5–5.0)
Alkaline Phosphatase: 119 U/L (ref 38–126)
Anion gap: 8 (ref 5–15)
BUN: 7 mg/dL — ABNORMAL LOW (ref 8–23)
CO2: 24 mmol/L (ref 22–32)
Calcium: 9.3 mg/dL (ref 8.9–10.3)
Chloride: 113 mmol/L — ABNORMAL HIGH (ref 98–111)
Creatinine: 0.85 mg/dL (ref 0.61–1.24)
GFR, Estimated: >60 mL/min (ref >60)
Glucose, Bld: 86 mg/dL (ref 70–99)
Potassium: 3.9 mmol/L (ref 3.5–5.1)
Sodium: 145 mmol/L (ref 135–145)
Total Bilirubin: 0.5 mg/dL (ref 0.3–1.2)
Total Protein: 7.4 g/dL (ref 6.5–8.1)

## 2020-10-31 LAB — SAMPLE TO BLOOD BANK

## 2020-10-31 LAB — IRON AND TIBC
Iron: 65 ug/dL (ref 42–163)
Saturation Ratios: 22 % (ref 20–55)
TIBC: 298 ug/dL (ref 202–409)
UIBC: 232 ug/dL (ref 117–376)

## 2020-10-31 LAB — FERRITIN: Ferritin: 254 ng/mL (ref 24–336)

## 2020-11-01 ENCOUNTER — Telehealth: Payer: Self-pay | Admitting: Hematology

## 2020-11-01 NOTE — Telephone Encounter (Signed)
Scheduled follow-up appointment per 8/31 los. Patient is aware. 

## 2020-11-06 ENCOUNTER — Encounter: Payer: Self-pay | Admitting: Hematology

## 2020-11-06 NOTE — Progress Notes (Signed)
HEMATOLOGY/ONCOLOGY CLINIC NOTE  Date of Service: 09/06/2020  Patient Care Team: [name], MD as PCP - General (Internal Medicine) Lorretta Harp, MD as PCP - Cardiology (Cardiology) Lorretta Harp, MD as Consulting Physician (Cardiology)   Endocrinologist: Dr. Jacelyn Pi  CHIEF COMPLAINTS/PURPOSE OF CONSULTATION:  F/u for IDA  DIAGNOSIS  Microcytic hypochromic Anemia with MCV 79.4 due to Iron deficiency. Ferritin 27, Iron sat too low to calculate. No overt evidence of bleeding. Patient notes recent fecal occult blood neg x 1. He has had significant iron deficiency anemia in the past in 2011 with a ferritin level of 9 and required a blood transfusion. EGD showed duodenitis and hiatal hernia. Patient remains at high risk of GI bleeding since he is on ASA+ plavix for his endovascular stent graft for repaired AAA and is on Cilostazole for PAD. Some element of anemia also appears to be temporally related to methimazole use (though agranulocytosis is the usual concern and isolated anemia is less usual) The hyperthyroidism itself can also contribute to the anemia until corrected. SPEP with no M spike No evidence of hemolysis B12/folate WNL  Current treatment -Iron polysaccharide '150mg'$  po daily for iron maintenance replacement -previously IV feraheme. Continue prn IV feraheme.  HISTORY OF PRESENTING ILLNESS: plz see initial consultation for details on initial presentation   INTERVAL HISTORY:  Shawn Fernandez is a 75 y.o. male here for evaluation and management of anemia. The patient's last visit with [place] was on 09/06/2020. the pt reports that he is doing well overall.  The pt reports that he tolerated the IV iron infusions well and has noted significant improvement in his energy levels. He continues to be on aspirin and notes no overt GI bleeding.  Labs done today 10/31/2020 show CBC with hemoglobin of 12.3 up from 6.7 MCV is within normal limits at 86 with normal  WBC count and platelets. CMP unremarkable Ferritin 254 up from 11 with an iron saturation of 22%  On review of systems, pt denies any other bleeding issues.  Notes his fatigue and dyspnea on exertion are much improved after IV iron and improvement in his hemoglobin levels.  MEDICAL HISTORY:   Past Medical History:  Diagnosis Date   Blood clot in abdominal vein    treated at [facility]   Carotid artery disease ([place])    Dyslipidemia    FHx: heart disease    History of blood transfusion 8-9 yrs ago   HTN (hypertension)    Peripheral vascular disease ([place])    Hyperthyroidism -being followed by Dr. Chalmers Cater - was previously on methimazole for 6-8 months and has been off for a few weeks now. He was treated with radioactive iodine about one week ago.  He had presented with progressive weight loss from 2 years prior to diagnosis.  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  Previous history of iron deficiency anemia ferritin was 9 about 5 years ago in 2011  SURGICAL HISTORY: Past Surgical History:  Procedure Laterality Date   ANKLE SURGERY Right yrs ago   arm surgery Left yrs ago   2 rods inserted, 1 rod later removed   COLONOSCOPY WITH PROPOFOL N/A 09/16/2013   Procedure: COLONOSCOPY WITH PROPOFOL;  Surgeon: Beryle Beams, MD;  Location: [facility] ENDOSCOPY;  Service: Endoscopy;  Laterality: N/A;   LOWER EXTREMITY ANGIOGRAM N/A 07/24/2011   Procedure: LOWER EXTREMITY ANGIOGRAM;  Surgeon: Lorretta Harp, MD;  Location: [facility] CATH LAB;  Service:  Cardiovascular;  Laterality: N/A;   stent in abdominal vein  4-5 yrs ago   US ECHOCARDIOGRAPHY  07-20-2007   EF 55-60%    SOCIAL HISTORY: Social History   Socioeconomic History   Marital status: Married    Spouse name: Not on file   Number of children: Not on file   Years of education: Not on file   Highest education level: Not on file  Occupational History   Occupation: retired  Tobacco Use    Smoking status: Former    Packs/day: 0.75    Years: 20.00    Pack years: 15.00    Types: Cigarettes    Quit date: 07/01/2009    Years since quitting: 11.3   Smokeless tobacco: Never  Vaping Use   Vaping Use: Never used  Substance and Sexual Activity   Alcohol use: Yes    Comment: only occ   Drug use: No   Sexual activity: Yes  Other Topics Concern   Not on file  Social History Narrative   Not on file   Social Determinants of Health   Financial Resource Strain: Low Risk    Difficulty of Paying Living Expenses: Not hard at all  Food Insecurity: No Food Insecurity   Worried About Charity fundraiser in the Last Year: Never true   [place] in the Last Year: Never true  Transportation Needs: No Transportation Needs   Lack of Transportation (Medical): No   Lack of Transportation (Non-Medical): No  Physical Activity: Sufficiently Active   Days of Exercise per Week: 7 days   Minutes of Exercise per Session: 30 min  Stress: No Stress Concern Present   Feeling of Stress : Not at all  Social Connections: Not on file  Intimate Partner Violence: Not on file    FAMILY HISTORY: Family History  Problem Relation Age of Onset   Heart disease Brother    Heart disease Mother    Cancer Father        ? type    ALLERGIES:  is allergic to lisinopril.  MEDICATIONS:  Current Outpatient Medications  Medication Sig Dispense Refill   albuterol (VENTOLIN HFA) 108 (90 Base) MCG/ACT inhaler TAKE 2 PUFFS BY MOUTH EVERY 6 HOURS AS NEEDED FOR WHEEZE OR SHORTNESS OF BREATH 8.5 each 1   amLODipine (NORVASC) 10 MG tablet TAKE 1 TABLET BY MOUTH EVERY DAY IN THE MORNING 90 tablet 2   aspirin EC 81 MG tablet Take 81 mg by mouth daily.     benzonatate (TESSALON PERLES) 100 MG capsule Take 2 capsules (200 mg total) by mouth 3 (three) times daily as needed for cough. (Patient not taking: No sig reported) 20 capsule 0   Cholecalciferol (VITAMIN D3) 1000 units CAPS Take 1 capsule by mouth daily.      clopidogrel (PLAVIX) 75 MG tablet TAKE 1 TABLET BY MOUTH EVERY DAY 90 tablet 1   hydrALAZINE (APRESOLINE) 25 MG tablet Take 1 tablet (25 mg total) by mouth 2 (two) times daily. 60 tablet 11   iron polysaccharides (NIFEREX) 150 MG capsule Take 1 capsule (150 mg total) by mouth daily. 30 capsule 5   Iron-FA-B Cmp-C-Biot-Probiotic (FUSION PLUS) CAPS TAKE 1 CAPSULE BY MOUTH EVERY DAY 30 capsule 1   levothyroxine (SYNTHROID) 125 MCG tablet Take 1 tablet (125 mcg total) by mouth daily. (Patient taking differently: Take 125 mcg by mouth daily. Monday - Saturday) 30 tablet 11   Multiple Vitamin (MULTIVITAMIN WITH MINERALS) TABS tablet Take 1 tablet by  mouth daily.     pantoprazole (PROTONIX) 40 MG tablet TAKE 1 TABLET BY MOUTH EVERY DAY 90 tablet 2   simvastatin (ZOCOR) 20 MG tablet TAKE 1 TABLET BY MOUTH EVERY DAY IN THE EVENING 90 tablet 1   valsartan (DIOVAN) 160 MG tablet TAKE 1 TABLET BY MOUTH EVERY DAY 90 tablet 1   No current facility-administered medications for this visit.    REVIEW OF SYSTEMS:   .10 Point review of Systems was done is negative except as noted above.  PHYSICAL EXAMINATION: ECOG FS:2 - Symptomatic, <50% confined to bed  Vitals:   10/31/20 1425  BP: (!) 168/77  Pulse: (!) 56  Resp: 18  Temp: 98.6 F (37 C)  SpO2: 100%    Wt Readings from Last 3 Encounters:  10/31/20 142 lb 14.4 oz (64.8 kg)  09/18/20 140 lb 12.8 oz (63.9 kg)  09/06/20 143 lb 1.6 oz (64.9 kg)   Body mass index is 21.73 kg/m.    Marland Kitchen GENERAL:alert, in no acute distress and comfortable SKIN: no acute rashes, no significant lesions EYES: conjunctiva are pink and non-injected, sclera anicteric OROPHARYNX: MMM, no exudates, no oropharyngeal erythema or ulceration NECK: supple, no JVD LYMPH:  no palpable lymphadenopathy in the cervical, axillary or inguinal regions LUNGS: clear to auscultation b/l with normal respiratory effort HEART: regular rate & rhythm ABDOMEN:  normoactive bowel sounds , non  tender, not distended. Extremity: no pedal edema PSYCH: alert & oriented x 3 with fluent speech NEURO: no focal motor/sensory deficits  LABORATORY DATA:  I have reviewed the data as listed  . CBC Latest Ref Rng & Units 10/31/2020 10/04/2020 09/26/2020  WBC 4.0 - 10.5 K/uL 5.7 6.6 6.3  Hemoglobin 13.0 - 17.0 g/dL 12.3(L) 8.3(L) 6.7(LL)  Hematocrit 39.0 - 52.0 % 41.0 27.4(L) 23.3(L)  Platelets 150 - 400 K/uL 193 415(H) 369   . CBC    Component Value Date/Time   WBC 5.7 10/31/2020 1349   WBC 3.6 (L) 09/06/2020 1005   RBC 4.77 10/31/2020 1349   HGB 12.3 (L) 10/31/2020 1349   HGB 11.8 (L) 01/09/2020 1453   HGB 13.9 03/21/2016 0919   HCT 41.0 10/31/2020 1349   HCT 36.7 (L) 01/09/2020 1453   HCT 43.1 03/21/2016 0919   PLT 193 10/31/2020 1349   PLT 229 01/09/2020 1453   MCV 86.0 10/31/2020 1349   MCV 89 01/09/2020 1453   MCV 89.2 03/21/2016 0919   MCH 25.8 (L) 10/31/2020 1349   MCHC 30.0 10/31/2020 1349   RDW 22.7 (H) 10/31/2020 1349   RDW 11.8 01/09/2020 1453   RDW 13.1 03/21/2016 0919   LYMPHSABS 1.4 10/31/2020 1349   LYMPHSABS 2.1 11/03/2018 1626   LYMPHSABS 2.5 03/21/2016 0919   MONOABS 0.4 10/31/2020 1349   MONOABS 0.8 03/21/2016 0919   EOSABS 0.2 10/31/2020 1349   EOSABS 0.2 11/03/2018 1626   BASOSABS 0.1 10/31/2020 1349   BASOSABS 0.1 11/03/2018 1626   BASOSABS 0.1 03/21/2016 0919    CMP Latest Ref Rng & Units 10/31/2020 09/26/2020 09/06/2020  Glucose 70 - 99 mg/dL 86 96 95  BUN 8 - 23 mg/dL 7(L) 13 11  Creatinine 0.61 - 1.24 mg/dL 0.85 0.85 0.94  Sodium 135 - 145 mmol/L 145 142 142  Potassium 3.5 - 5.1 mmol/L 3.9 4.1 3.9  Chloride 98 - 111 mmol/L 113(H) 109 110  CO2 22 - 32 mmol/L '24 25 24  '$ Calcium 8.9 - 10.3 mg/dL 9.3 9.6 9.4  Total Protein 6.5 - 8.1 g/dL 7.4  7.5 7.3  Total Bilirubin 0.3 - 1.2 mg/dL 0.5 0.4 0.6  Alkaline Phos 38 - 126 U/L 119 130(H) 104  AST 15 - 41 U/L '22 16 18  '$ ALT 0 - 44 U/L 11 <6 <6   . Lab Results  Component Value Date   IRON 65  10/31/2020   TIBC 298 10/31/2020   IRONPCTSAT 22 10/31/2020   (Iron and TIBC)  Lab Results  Component Value Date   FERRITIN 254 10/31/2020   RADIOGRAPHIC STUDIES: I have personally reviewed the radiological images as listed and agreed with the findings in the report. No results found.   ASSESSMENT & PLAN:   75 yo AAM with   1) Microcytic hypochromic Anemia due to Iron deficiency - now resolved.  Previously had Ferritin 27, Iron sat too low to calculate. No overt evidence of bleeding. Patient notes recent fecal occult blood neg x 1. He has had significant iron deficiency anemia in the past in 2011 with a ferritin level of 9 and required a blood transfusion. EGD showed duodenitis and hiatal hernia. Patient remains at high risk of GI bleeding since he is on ASA+ plavix for his endovascular stent graft for repaired AAA and is on Cilostazole for PAD. Some element of anemia also appears to be temporally related to methimazole use (though agranulocytosis is the usual concern and isolated anemia is less usual) The hyperthyroidism itself can also contribute to the anemia until corrected. SPEP with no M spike No evidence of hemolysis B12/folate WNL   PLAN: -Discussed pt labwork today, 10/31/2020  CBC within normal limits with hemoglobin of 12.2 and MCV of 86 normal WBC count and platelets CMP unremarkable Ferritin of 254 with an iron saturation of 22%. -No indication for additional IV iron at this time -Would prefer to keep Ferritin >= 100 -patient is currently at goal. -Recommended pt f/u w GI and by PCP regarding continued GI bleeding. -Refill Iron Polysaccharide. -Will see back in 8 weeks with labs.   FOLLOW UP: [place] with Dr Irene Limbo with labs in 6 months F/u with PCP for labs in 3 months  . The total time spent in the appointment was 20 minutes and more than 50% was on counseling and direct patient cares.   All of the patient's questions were answered with apparent  satisfaction. The patient knows to call the clinic with any problems, questions or concerns.    Sullivan Lone MD [place] AAHIVMS [facility] Hematology/Oncology Physician [facility]  (Office):       [phone] (Work cell):  [phone] (Fax):           [phone]  I, Reinaldo Raddle, am acting as scribe for Dr. Sullivan Lone, MD.   .I have reviewed the above documentation for accuracy and completeness, and I agree with the above. Brunetta Genera MD

## 2020-11-14 ENCOUNTER — Encounter: Payer: Self-pay | Admitting: Internal Medicine

## 2020-11-14 ENCOUNTER — Ambulatory Visit (INDEPENDENT_AMBULATORY_CARE_PROVIDER_SITE_OTHER): Payer: Medicare Other | Admitting: Internal Medicine

## 2020-11-14 ENCOUNTER — Other Ambulatory Visit: Payer: Self-pay

## 2020-11-14 VITALS — BP 172/80 | HR 78 | Temp 98.6°F | Ht 66.8 in | Wt 144.6 lb

## 2020-11-14 DIAGNOSIS — I739 Peripheral vascular disease, unspecified: Secondary | ICD-10-CM

## 2020-11-14 DIAGNOSIS — Z79899 Other long term (current) drug therapy: Secondary | ICD-10-CM

## 2020-11-14 DIAGNOSIS — Z23 Encounter for immunization: Secondary | ICD-10-CM | POA: Diagnosis not present

## 2020-11-14 DIAGNOSIS — I10 Essential (primary) hypertension: Secondary | ICD-10-CM

## 2020-11-14 DIAGNOSIS — E039 Hypothyroidism, unspecified: Secondary | ICD-10-CM | POA: Diagnosis not present

## 2020-11-14 MED ORDER — LEVOTHYROXINE SODIUM 150 MCG PO TABS: 150.0000 ug | ORAL_TABLET | Freq: Every day | ORAL | 11 refills | Status: DC

## 2020-11-14 MED ORDER — AMLODIPINE BESYLATE 10 MG PO TABS: ORAL_TABLET | ORAL | 2 refills | Status: DC

## 2020-11-14 MED ORDER — ZOSTER VAC RECOMB ADJUVANTED 50 MCG/0.5ML IM SUSR: 0.5000 mL | Freq: Once | INTRAMUSCULAR | 0 refills | Status: AC

## 2020-11-14 NOTE — Patient Instructions (Signed)

## 2020-11-14 NOTE — Progress Notes (Signed)
I,YAMILKA J Llittleton,acting as a Education administrator for [name], MD.,have documented all relevant documentation on the behalf of [name], MD,as directed by  [name], MD while in the presence of [name], MD.  This visit occurred during the SARS-CoV-2 public health emergency.  Safety protocols were in place, including screening questions prior to the visit, additional usage of staff PPE, and extensive cleaning of exam room while observing appropriate contact time as indicated for disinfecting solutions.  Subjective:     Patient ID: Shawn Fernandez , male    DOB: 12-30-1945 , 75 y.o.   MRN: [id number]   Chief Complaint  Patient presents with   Hypertension   Hypothyroidism    HPI  He presents today for a bp and thyroid recheck. Patient is compliant with his meds.   Hypertension This is a chronic problem. The current episode started more than 1 year ago. The problem has been gradually improving since onset. The problem is controlled. Pertinent negatives include no blurred vision, chest pain, headaches, palpitations or shortness of breath. Risk factors for coronary artery disease include male gender and dyslipidemia.    Past Medical History:  Diagnosis Date   Blood clot in abdominal vein    treated at [facility]   Carotid artery disease ([facility])    Dyslipidemia    FHx: heart disease    History of blood transfusion 8-9 yrs ago   HTN (hypertension)    Peripheral vascular disease ([place])      Family History  Problem Relation Age of Onset   Heart disease Brother    Heart disease Mother    Cancer Father        ? type     Current Outpatient Medications:    albuterol (VENTOLIN HFA) 108 (90 Base) MCG/ACT inhaler, TAKE 2 PUFFS BY MOUTH EVERY 6 HOURS AS NEEDED FOR WHEEZE OR SHORTNESS OF BREATH, Disp: 8.5 each, Rfl: 1   aspirin EC 81 MG tablet, Take 81 mg by mouth daily., Disp: , Rfl:    Cholecalciferol (VITAMIN D3) 1000 units CAPS, Take 1 capsule by mouth daily., Disp: ,  Rfl:    clopidogrel (PLAVIX) 75 MG tablet, TAKE 1 TABLET BY MOUTH EVERY DAY, Disp: 90 tablet, Rfl: 1   hydrALAZINE (APRESOLINE) 25 MG tablet, Take 1 tablet (25 mg total) by mouth 2 (two) times daily., Disp: 60 tablet, Rfl: 11   iron polysaccharides (NIFEREX) 150 MG capsule, Take 1 capsule (150 mg total) by mouth daily., Disp: 30 capsule, Rfl: 5   Iron-FA-B Cmp-C-Biot-Probiotic (FUSION PLUS) CAPS, TAKE 1 CAPSULE BY MOUTH EVERY DAY, Disp: 30 capsule, Rfl: 1   levothyroxine (SYNTHROID) 150 MCG tablet, Take 1 tablet (150 mcg total) by mouth daily., Disp: 30 tablet, Rfl: 11   Multiple Vitamin (MULTIVITAMIN WITH MINERALS) TABS tablet, Take 1 tablet by mouth daily., Disp: , Rfl:    pantoprazole (PROTONIX) 40 MG tablet, TAKE 1 TABLET BY MOUTH EVERY DAY, Disp: 90 tablet, Rfl: 2   simvastatin (ZOCOR) 20 MG tablet, TAKE 1 TABLET BY MOUTH EVERY DAY IN THE EVENING, Disp: 90 tablet, Rfl: 1   valsartan (DIOVAN) 160 MG tablet, TAKE 1 TABLET BY MOUTH EVERY DAY, Disp: 90 tablet, Rfl: 1   amLODipine (NORVASC) 10 MG tablet, TAKE 1 TABLET BY MOUTH EVERY DAY IN THE MORNING, Disp: 90 tablet, Rfl: 2   Allergies  Allergen Reactions   Lisinopril Cough     Review of Systems  Constitutional: Negative.   Eyes:  Negative for  blurred vision.  Respiratory: Negative.  Negative for shortness of breath.   Cardiovascular: Negative.  Negative for chest pain and palpitations.  Gastrointestinal: Negative.   Neurological: Negative.  Negative for headaches.  Psychiatric/Behavioral: Negative.      Today's Vitals   11/14/20 1410  BP: (!) 172/80  Pulse: 78  Temp: 98.6 F (37 C)  Weight: 144 lb 9.6 oz (65.6 kg)  Height: 5' 6.8" (1.697 m)  PainSc: 0-No pain   Body mass index is 22.78 kg/m.  Wt Readings from Last 3 Encounters:  11/14/20 144 lb 9.6 oz (65.6 kg)  10/31/20 142 lb 14.4 oz (64.8 kg)  09/18/20 140 lb 12.8 oz (63.9 kg)     Objective:  Physical Exam Vitals and nursing note reviewed.  Constitutional:       Appearance: Normal appearance.  HENT:     Head: Normocephalic and atraumatic.     Nose:     Comments: Masked     Mouth/Throat:     Comments: Masked  Cardiovascular:     Rate and Rhythm: Normal rate and regular rhythm.     Heart sounds: Normal heart sounds.  Pulmonary:     Effort: Pulmonary effort is normal.     Breath sounds: Normal breath sounds.  Musculoskeletal:     Cervical back: Normal range of motion.  Skin:    General: Skin is warm.  Neurological:     General: No focal deficit present.     Mental Status: He is alert.  Psychiatric:        Mood and Affect: Mood normal.        Assessment And Plan:     1. Essential hypertension, benign Comments: Uncontrolled.  He reports being out of amlodipine. Refills sent to pharmacy. Importance of medication and dietary compliance was stressed to patient.   2. Peripheral arterial disease ([place]) Comments: I contacted his cardiologist, Dr. Gwenlyn Found via securechat during visit. Pt advised to stop Plavix, esp in setting of anemia/GI bleed.   3. Acquired hypothyroidism Comments: I will check thyroid panel and adjust meds as needed.  - TSH - T3, free - T4, Free  4. Immunization due Comments: I will send rx Shingrix to his local pharmacy. He prefers to wait to get flu shot with his wife at his local pharmacy.  - Zoster Vaccine Adjuvanted [facility]) injection; Inject 0.5 mLs into the muscle once for 1 dose.  Dispense: 0.5 mL; Refill: 0  5. Drug therapy   Patient was given opportunity to ask questions. Patient verbalized understanding of the plan and was able to repeat key elements of the plan. All questions were answered to their satisfaction.   I, [name], MD, have reviewed all documentation for this visit. The documentation on 11/18/20 for the exam, diagnosis, procedures, and orders are all accurate and complete.   IF YOU HAVE BEEN REFERRED TO A SPECIALIST, IT MAY TAKE 1-2 WEEKS TO SCHEDULE/PROCESS THE REFERRAL. IF YOU HAVE NOT  HEARD FROM US/SPECIALIST IN TWO WEEKS, PLEASE GIVE [place] A CALL AT [phone].   THE PATIENT IS ENCOURAGED TO PRACTICE SOCIAL DISTANCING DUE TO THE COVID-19 PANDEMIC.

## 2020-11-15 LAB — T3, FREE: T3, Free: 2.3 pg/mL (ref 2.0–4.4)

## 2020-11-15 LAB — TSH: TSH: 0.052 u[IU]/mL — ABNORMAL LOW (ref 0.450–4.500)

## 2020-11-15 LAB — T4, FREE: Free T4: 1.28 ng/dL (ref 0.82–1.77)

## 2020-11-23 ENCOUNTER — Other Ambulatory Visit: Payer: Self-pay

## 2020-11-28 ENCOUNTER — Other Ambulatory Visit: Payer: Self-pay | Admitting: Internal Medicine

## 2020-12-04 ENCOUNTER — Ambulatory Visit (INDEPENDENT_AMBULATORY_CARE_PROVIDER_SITE_OTHER): Payer: Medicare Other

## 2020-12-04 ENCOUNTER — Other Ambulatory Visit: Payer: Self-pay

## 2020-12-04 ENCOUNTER — Ambulatory Visit: Payer: Medicare Other

## 2020-12-04 VITALS — BP 130/72 | HR 78 | Temp 98.3°F | Ht 65.8 in | Wt 146.6 lb

## 2020-12-04 DIAGNOSIS — Z23 Encounter for immunization: Secondary | ICD-10-CM

## 2020-12-04 NOTE — Progress Notes (Signed)
Patient is here for medication clarification and scheduling. He also received his flu shot today.

## 2020-12-07 ENCOUNTER — Other Ambulatory Visit: Payer: Self-pay | Admitting: Internal Medicine

## 2021-01-17 ENCOUNTER — Other Ambulatory Visit: Payer: Self-pay

## 2021-01-17 ENCOUNTER — Encounter: Payer: Self-pay | Admitting: Internal Medicine

## 2021-01-17 ENCOUNTER — Ambulatory Visit (INDEPENDENT_AMBULATORY_CARE_PROVIDER_SITE_OTHER): Payer: Medicare Other | Admitting: Internal Medicine

## 2021-01-17 VITALS — BP 142/70 | HR 73 | Temp 98.3°F | Ht 65.8 in | Wt 147.2 lb

## 2021-01-17 DIAGNOSIS — E7800 Pure hypercholesterolemia, unspecified: Secondary | ICD-10-CM | POA: Diagnosis not present

## 2021-01-17 DIAGNOSIS — I10 Essential (primary) hypertension: Secondary | ICD-10-CM

## 2021-01-17 DIAGNOSIS — Z0000 Encounter for general adult medical examination without abnormal findings: Secondary | ICD-10-CM

## 2021-01-17 DIAGNOSIS — E039 Hypothyroidism, unspecified: Secondary | ICD-10-CM | POA: Diagnosis not present

## 2021-01-17 DIAGNOSIS — H6121 Impacted cerumen, right ear: Secondary | ICD-10-CM

## 2021-01-17 DIAGNOSIS — I779 Disorder of arteries and arterioles, unspecified: Secondary | ICD-10-CM

## 2021-01-17 DIAGNOSIS — Z79899 Other long term (current) drug therapy: Secondary | ICD-10-CM

## 2021-01-17 DIAGNOSIS — Z6823 Body mass index (BMI) 23.0-23.9, adult: Secondary | ICD-10-CM

## 2021-01-17 LAB — POCT URINALYSIS DIPSTICK
Bilirubin, UA: NEGATIVE
Blood, UA: NEGATIVE
Glucose, UA: NEGATIVE
Ketones, UA: NEGATIVE
Leukocytes, UA: NEGATIVE
Nitrite, UA: NEGATIVE
Protein, UA: NEGATIVE
Spec Grav, UA: 1.02 (ref 1.010–1.025)
Urobilinogen, UA: 1 E.U./dL
pH, UA: 7.5 (ref 5.0–8.0)

## 2021-01-17 LAB — POCT UA - MICROALBUMIN
Albumin/Creatinine Ratio, Urine, POC: 30
Creatinine, POC: 50 mg/dL
Microalbumin Ur, POC: 10 mg/L

## 2021-01-17 NOTE — Patient Instructions (Signed)

## 2021-01-17 NOTE — Progress Notes (Signed)
[name],acting as a Education administrator for [name], MD.,have documented all relevant documentation on the behalf of [name], MD,as directed by  [name], MD while in the presence of [name], MD.  This visit occurred during the SARS-CoV-2 public health emergency.  Safety protocols were in place, including screening questions prior to the visit, additional usage of staff PPE, and extensive cleaning of exam room while observing appropriate contact time as indicated for disinfecting solutions.  Subjective:     Patient ID: Shawn Fernandez , male    DOB: [birth year] , 75 y.o.   MRN: [id number]   Chief Complaint  Patient presents with   Annual Exam   Hypertension    HPI  Patient presents today for his physical. He is compliant with all his meds. Patient reports his shingles and pneumonia vaccine have both been sent to the pharmacy he has to go get them.     Past Medical History:  Diagnosis Date   Blood clot in abdominal vein    treated at [facility]   Carotid artery disease (HCC)    Dyslipidemia    FHx: heart disease    History of blood transfusion 8-9 yrs ago   HTN (hypertension)    Peripheral vascular disease (HCC)      Family History  Problem Relation Age of Onset   Heart disease Brother    Heart disease Mother    Cancer Father        ? type     Current Outpatient Medications:    albuterol (VENTOLIN HFA) 108 (90 Base) MCG/ACT inhaler, TAKE 2 PUFFS BY MOUTH EVERY 6 HOURS AS NEEDED FOR WHEEZE OR SHORTNESS OF BREATH, Disp: 8.5 each, Rfl: 1   amLODipine (NORVASC) 10 MG tablet, TAKE 1 TABLET BY MOUTH EVERY DAY IN THE MORNING, Disp: 90 tablet, Rfl: 2   aspirin EC 81 MG tablet, Take 81 mg by mouth daily., Disp: , Rfl:    Cholecalciferol (VITAMIN D3) 1000 units CAPS, Take 1 capsule by mouth daily., Disp: , Rfl:    hydrALAZINE (APRESOLINE) 25 MG tablet, Take 1 tablet (25 mg total) by mouth 2 (two) times daily., Disp: 60 tablet, Rfl: 11   levothyroxine  (SYNTHROID) 150 MCG tablet, Take 1 tablet (150 mcg total) by mouth daily. (Patient taking differently: Take 150 mcg by mouth daily. Take one tablet by mouth Monday through Friday. Skip Saturday and sundays.), Disp: 30 tablet, Rfl: 11   Multiple Vitamin (MULTIVITAMIN WITH MINERALS) TABS tablet, Take 1 tablet by mouth daily., Disp: , Rfl:    pantoprazole (PROTONIX) 40 MG tablet, TAKE 1 TABLET BY MOUTH EVERY DAY, Disp: 90 tablet, Rfl: 2   valsartan (DIOVAN) 160 MG tablet, TAKE 1 TABLET BY MOUTH EVERY DAY, Disp: 90 tablet, Rfl: 1   clopidogrel (PLAVIX) 75 MG tablet, TAKE 1 TABLET BY MOUTH EVERY DAY, Disp: 90 tablet, Rfl: 1   iron polysaccharides (NIFEREX) 150 MG capsule, TAKE 1 CAPSULE BY MOUTH EVERY DAY, Disp: 90 capsule, Rfl: 1   Iron-FA-B Cmp-C-Biot-Probiotic (FUSION PLUS) CAPS, TAKE 1 CAPSULE BY MOUTH EVERY DAY (Patient not taking: Reported on 01/17/2021), Disp: 30 capsule, Rfl: 1   simvastatin (ZOCOR) 20 MG tablet, TAKE 1 TABLET BY MOUTH EVERY DAY IN THE EVENING, Disp: 90 tablet, Rfl: 1   Allergies  Allergen Reactions   Lisinopril Cough     Men's preventive visit. Patient Health Questionnaire (PHQ-2) is  Flowsheet Row Clinical Support from 04/19/2020 in [place]  PHQ-2 Total Score 0     . Patient is on a low salt diet. Marital status: Married. Relevant history for alcohol use is:  Social History   Substance and Sexual Activity  Alcohol Use Yes   Comment: only occ  . Relevant history for tobacco use is:  Social History   Tobacco Use  Smoking Status Former   Packs/day: 0.75   Years: 20.00   Pack years: 15.00   Types: Cigarettes   Quit date: 07/01/2009   Years since quitting: 11.6  Smokeless Tobacco Never  .   Review of Systems  Constitutional: Negative.   HENT: Negative.    Eyes: Negative.   Respiratory: Negative.    Cardiovascular: Negative.   Gastrointestinal: Negative.   Endocrine: Negative.   Genitourinary: Negative.   Musculoskeletal:  Negative.   Skin: Negative.   Allergic/Immunologic: Negative.   Neurological: Negative.   Hematological: Negative.   Psychiatric/Behavioral: Negative.      Today's Vitals   01/17/21 1417  BP: (!) 142/70  Pulse: 73  Temp: 98.3 F (36.8 C)  Weight: 147 lb 3.2 oz (66.8 kg)  Height: 5' 5.8" (1.671 m)  PainSc: 0-No pain   Body mass index is 23.9 kg/m.  Wt Readings from Last 3 Encounters:  01/17/21 147 lb 3.2 oz (66.8 kg)  12/04/20 146 lb 9.6 oz (66.5 kg)  11/14/20 144 lb 9.6 oz (65.6 kg)     Objective:  Physical Exam Vitals and nursing note reviewed.  Constitutional:      Appearance: Normal appearance.  HENT:     Head: Normocephalic and atraumatic.     Right Ear: Ear canal and external ear normal. There is impacted cerumen.     Left Ear: Tympanic membrane, ear canal and external ear normal.     Nose:     Comments: Masked     Mouth/Throat:     Comments: Masked  Eyes:     Extraocular Movements: Extraocular movements intact.     Conjunctiva/sclera: Conjunctivae normal.     Pupils: Pupils are equal, round, and reactive to light.  Cardiovascular:     Rate and Rhythm: Normal rate and regular rhythm.     Pulses: Normal pulses.     Heart sounds: Normal heart sounds.  Pulmonary:     Effort: Pulmonary effort is normal.     Breath sounds: Normal breath sounds.  Chest:  Breasts:    Right: Normal. No swelling, bleeding, inverted nipple, mass or nipple discharge.     Left: Normal. No swelling, bleeding, inverted nipple, mass or nipple discharge.  Abdominal:     General: Abdomen is flat. Bowel sounds are normal.     Palpations: Abdomen is soft.  Genitourinary:    Prostate: Normal.     Rectum: Normal. Guaiac result negative.  Musculoskeletal:        General: Normal range of motion.     Cervical back: Normal range of motion and neck supple.     Comments: Bony abnormality left forearm  Skin:    General: Skin is warm.  Neurological:     General: No focal deficit present.      Mental Status: He is alert.  Psychiatric:        Mood and Affect: Mood normal.        Behavior: Behavior normal.        Assessment And Plan:    1. Encounter for general adult medical examination w/o abnormal findings Comments: A full exam was performed, DRE not performed. PATIENT  IS ADVISED TO GET 30-45 MINUTES REGULAR EXERCISE NO LESS THAN FOUR TO FIVE DAYS PER WEEK - BOTH WEIGHTBEARING EXERCISES AND AEROBIC ARE RECOMMENDED.  PATIENT IS ADVISED TO FOLLOW A HEALTHY DIET WITH AT LEAST SIX FRUITS/VEGGIES PER DAY, DECREASE INTAKE OF RED MEAT, AND TO INCREASE FISH INTAKE TO TWO DAYS PER WEEK.  MEATS/FISH SHOULD NOT BE FRIED, BAKED OR BROILED IS PREFERABLE.  IT IS ALSO IMPORTANT TO CUT BACK ON YOUR SUGAR INTAKE. PLEASE AVOID ANYTHING WITH ADDED SUGAR, CORN SYRUP OR OTHER SWEETENERS. IF YOU MUST USE A SWEETENER, YOU CAN TRY STEVIA. IT IS ALSO IMPORTANT TO AVOID ARTIFICIALLY SWEETENERS AND DIET BEVERAGES. LASTLY, I SUGGEST WEARING SPF 50 SUNSCREEN ON EXPOSED PARTS AND ESPECIALLY WHEN IN THE DIRECT SUNLIGHT FOR AN EXTENDED PERIOD OF TIME.  PLEASE AVOID FAST FOOD RESTAURANTS AND INCREASE YOUR WATER INTAKE.   2. Essential hypertension, benign Comments: Chronic, fair control. Goal BP <130/80. Advised to follow low sodium diet, decrease intake of processed meats including bacon, sausages and deli meats. He will f/u in 3-4 months for re-evaluation. I will check renal function today.  - POCT Urinalysis Dipstick (81002) - POCT UA - Microalbumin - EKG 12-Lead - BMP8+EGFR  3. Acquired hypothyroidism Comments: I will check thyroid panel and adjust meds as needed.  - Lipid panel - CBC no Diff - T4, Free - TSH  4. Pure hypercholesterolemia Comments: Chronic, currently on simvastatin 30m daily. He is encouraged to follow heart healthy diet.  - Lipid panel  5. Right ear impacted cerumen AFTER OBTAINING VERBAL CONSENT, RIGHT EAR WAS FLUSHED BY IRRIGATION. SHE TOLERATED PROCEDURE WELL WITHOUT ANY  COMPLICATIONS. NO TM ABNORMALITIES WERE NOTED.  - Ear Lavage  6. Carotid artery disease, unspecified laterality, unspecified type ([place] Comments: Chronic, followed by Cardiology. Encouraged to comply with statin therapy. Advised to follow heart healthy diet.   7. BMI 23.0-23.9, adult Comments: He is encouraged to gradually increase his daily activity, aiming for 150 minutes of exercise per week.   8. Encounter for long-term (current) use of medications  Patient was given opportunity to ask questions. Patient verbalized understanding of the plan and was able to repeat key elements of the plan. All questions were answered to their satisfaction.   I, RMaximino Greenland MD, have reviewed all documentation for this visit. The documentation on 02/10/21 for the exam, diagnosis, procedures, and orders are all accurate and complete.   THE PATIENT IS ENCOURAGED TO PRACTICE SOCIAL DISTANCING DUE TO THE COVID-19 PANDEMIC.

## 2021-01-18 ENCOUNTER — Encounter: Payer: Self-pay | Admitting: Internal Medicine

## 2021-01-18 ENCOUNTER — Other Ambulatory Visit: Payer: Self-pay

## 2021-01-18 LAB — CBC
Hematocrit: 41.2 % (ref 37.5–51.0)
Hemoglobin: 12.9 g/dL — ABNORMAL LOW (ref 13.0–17.7)
MCH: 26.9 pg (ref 26.6–33.0)
MCHC: 31.3 g/dL — ABNORMAL LOW (ref 31.5–35.7)
MCV: 86 fL (ref 79–97)
Platelets: 193 10*3/uL (ref 150–450)
RBC: 4.79 x10E6/uL (ref 4.14–5.80)
RDW: 12.2 % (ref 11.6–15.4)
WBC: 6.6 10*3/uL (ref 3.4–10.8)

## 2021-01-18 LAB — BMP8+EGFR
BUN/Creatinine Ratio: 12 (ref 10–24)
BUN: 11 mg/dL (ref 8–27)
CO2: 23 mmol/L (ref 20–29)
Calcium: 9.5 mg/dL (ref 8.6–10.2)
Chloride: 103 mmol/L (ref 96–106)
Creatinine, Ser: 0.93 mg/dL (ref 0.76–1.27)
Glucose: 87 mg/dL (ref 70–99)
Potassium: 4.2 mmol/L (ref 3.5–5.2)
Sodium: 142 mmol/L (ref 134–144)
eGFR: 86 mL/min/{1.73_m2} (ref >59)

## 2021-01-18 LAB — TSH: TSH: 0.021 u[IU]/mL — ABNORMAL LOW (ref 0.450–4.500)

## 2021-01-18 LAB — T4, FREE: Free T4: 1.85 ng/dL — ABNORMAL HIGH (ref 0.82–1.77)

## 2021-01-18 LAB — LIPID PANEL
Chol/HDL Ratio: 2.1 ratio (ref 0.0–5.0)
Cholesterol, Total: 161 mg/dL (ref 100–199)
HDL: 78 mg/dL (ref >39)
LDL Chol Calc (NIH): 71 mg/dL (ref 0–99)
Triglycerides: 59 mg/dL (ref 0–149)
VLDL Cholesterol Cal: 12 mg/dL (ref 5–40)

## 2021-01-30 ENCOUNTER — Other Ambulatory Visit: Payer: Self-pay | Admitting: Internal Medicine

## 2021-01-30 ENCOUNTER — Other Ambulatory Visit: Payer: Self-pay | Admitting: Hematology

## 2021-02-27 ENCOUNTER — Telehealth: Payer: Self-pay

## 2021-02-27 NOTE — Telephone Encounter (Signed)
Pt lvm. Returned pt vm, lvm for pt to give the office a call back.

## 2021-02-28 ENCOUNTER — Other Ambulatory Visit: Payer: Self-pay

## 2021-02-28 ENCOUNTER — Other Ambulatory Visit: Payer: Medicare Other

## 2021-02-28 DIAGNOSIS — E039 Hypothyroidism, unspecified: Secondary | ICD-10-CM

## 2021-03-01 ENCOUNTER — Telehealth: Payer: Self-pay

## 2021-03-01 LAB — TSH: TSH: 0.016 u[IU]/mL — ABNORMAL LOW (ref 0.450–4.500)

## 2021-03-01 NOTE — Telephone Encounter (Signed)
I called pt and left him a vm to call the office I wanted to notify him that Dr.Sanders wants to change his dose of Synthroid to 153mcg daily and to schedule him a lab visit in 6 weeks. He needs to come in on Tuesday to pick up some samples. YL,RMA

## 2021-03-06 ENCOUNTER — Telehealth: Payer: Self-pay

## 2021-03-06 NOTE — Telephone Encounter (Signed)
The pt was notified of his new thyroid regimen, chart has been updated. 147mcg daily

## 2021-03-07 ENCOUNTER — Other Ambulatory Visit: Payer: Self-pay

## 2021-03-07 MED ORDER — LEVOTHYROXINE SODIUM 125 MCG PO TABS: 125.0000 ug | ORAL_TABLET | Freq: Every day | ORAL | 1 refills | Status: DC

## 2021-03-14 ENCOUNTER — Ambulatory Visit (INDEPENDENT_AMBULATORY_CARE_PROVIDER_SITE_OTHER): Payer: Medicare Other | Admitting: Internal Medicine

## 2021-03-14 ENCOUNTER — Other Ambulatory Visit: Payer: Self-pay

## 2021-03-14 ENCOUNTER — Encounter: Payer: Self-pay | Admitting: Internal Medicine

## 2021-03-14 VITALS — BP 150/72 | HR 67 | Temp 98.0°F | Ht 67.0 in | Wt 146.4 lb

## 2021-03-14 DIAGNOSIS — M545 Low back pain, unspecified: Secondary | ICD-10-CM | POA: Diagnosis not present

## 2021-03-14 DIAGNOSIS — M79671 Pain in right foot: Secondary | ICD-10-CM | POA: Diagnosis not present

## 2021-03-14 DIAGNOSIS — I10 Essential (primary) hypertension: Secondary | ICD-10-CM

## 2021-03-14 DIAGNOSIS — G8929 Other chronic pain: Secondary | ICD-10-CM | POA: Diagnosis not present

## 2021-03-14 DIAGNOSIS — F4321 Adjustment disorder with depressed mood: Secondary | ICD-10-CM

## 2021-03-14 NOTE — Patient Instructions (Signed)
TAKE AMLODIPINE 10MG  IN THE MORNING  TAKE VALSARTAN IN THE EVENINGS  TAKE HYDRALAZINE TWICE DAILY  BE SURE TO TAKE BP MEDS BEFORE APPTS WITH MD  Major Depressive Disorder, Adult Major depressive disorder is a mental health condition. This disorder affects feelings. It can also affect the body. Symptoms of this condition last most of the day, almost every day, for 2 weeks. This disorder can affect: Relationships. Daily activities, such as work and school. Activities that you normally like to do. What are the causes? The cause of this condition is not known. The disorder is likely caused by a mix of things, including: Your personality, such as being a shy person. Your behavior, or how you act toward others. Your thoughts and feelings. Too much alcohol or drugs. How you react to stress. Health and mental problems that you have had for a long time. Things that hurt you in the past (trauma). Big changes in your life, such as divorce. What increases the risk? The following factors may make you more likely to develop this condition: Having family members with depression. Being a woman. Problems in the family. Low levels of some brain chemicals. Things that caused you pain as a child, especially if you lost a parent or were abused. A lot of stress in your life, such as from: Living without basic needs of life, such as food and shelter. Being treated poorly because of race, sex, or religion (discrimination). Health and mental problems that you have had for a long time. What are the signs or symptoms? The main symptoms of this condition are: Being sad all the time. Being grouchy all the time. Loss of interest in things and activities. Other symptoms include: Sleeping too much or too little. Eating too much or too little. Gaining or losing weight, without knowing why. Feeling tired or having low energy. Being restless and weak. Feeling hopeless, worthless, or guilty. Trouble  thinking clearly or making decisions. Thoughts of hurting yourself or others, or thoughts of ending your life. Spending a lot of time alone. Inability to complete common tasks of daily life. If you have very bad MDD, you may: Believe things that are not true. Hear, see, taste, or feel things that are not there. Have mild depression that lasts for at least 2 years. Feel very sad and hopeless. Have trouble speaking or moving. How is this treated? This condition may be treated with: Talk therapy. This teaches you to know bad thoughts, feelings, and actions and how to change them. This can also help you to communicate with others. This can be done with members of your family. Medicines. These can be used to treat worry (anxiety), depression, or low levels of chemicals in the brain. Lifestyle changes. You may need to: Limit alcohol use. Limit drug use. Get regular exercise. Get plenty of sleep. Make healthy eating choices. Spend more time outdoors. Brain stimulation. This treatment excites the brain. This is done when symptoms are very bad or have not gotten better with other treatments. Follow these instructions at home: Activity Get regular exercise as told. Spend time outdoors as told. Make time to do the things you enjoy. Find ways to deal with stress. Try to: Meditate. Do deep breathing. Spend time in nature. Keep a journal. Return to your normal activities as told by your doctor. Ask your doctor what activities are safe for you. Alcohol and drug use If you drink alcohol: Limit how much you use to: 0-1 drink a day for women. 0-2  drinks a day for men. Be aware of how much alcohol is in your drink. In the [place] one drink equals one 12 oz bottle of beer (355 mL), one 5 oz glass of wine (148 mL), or one 1 oz glass of hard liquor (44 mL). Talk to your doctor about: Alcohol use. Alcohol can affect some medicines. Any drug use. General instructions  Take over-the-counter and  prescription medicines and herbal preparations only as told by your doctor. Eat a healthy diet. Get a lot of sleep. Think about joining a support group. Your doctor may be able to suggest one. Keep all follow-up visits as told by your doctor. This is important. Where to find more information: [employer] on Mental Illness: www.nami.[place]: https://carter.com/ American Psychiatric Association: www.psychiatry.org/patients-families/ Contact a doctor if: Your symptoms get worse. You get new symptoms. Get help right away if: You hurt yourself. You have serious thoughts about hurting yourself or others. You see, hear, taste, smell, or feel things that are not there. If you ever feel like you may hurt yourself or others, or have thoughts about taking your own life, get help right away. Go to your nearest emergency department or: Call your local emergency services (911 in the [place] Call a suicide crisis helpline, such as the [facility] at 630-456-0539 or 988 in the [place]. This is open 24 hours a day in the [place] Text the Crisis Text Line at 575-145-9746 (in the [place].). Summary Major depressive disorder is a mental health condition. This disorder affects feelings. Symptoms of this condition last most of the day, almost every day, for 2 weeks. The symptoms of this disorder can cause problems with relationships and with daily activities. There are treatments and support for people who get this disorder. You may need more than one type of treatment. Get help right away if you have serious thoughts about hurting yourself or others. This information is not intended to replace advice given to you by your health care provider. Make sure you discuss any questions you have with your health care provider. Document Revised: 09/12/2020 Document Reviewed: 01/29/2019 Elsevier Patient Education  2022 [employer].

## 2021-03-14 NOTE — Progress Notes (Signed)
[name],acting as a Education administrator for [name], MD.,have documented all relevant documentation on the behalf of [name], MD,as directed by  [name], MD while in the presence of [name], MD.  This visit occurred during the SARS-CoV-2 public health emergency.  Safety protocols were in place, including screening questions prior to the visit, additional usage of staff PPE, and extensive cleaning of exam room while observing appropriate contact time as indicated for disinfecting solutions.  Subjective:     Patient ID: Shawn Fernandez , male    DOB: 1945-10-19 , 76 y.o.   MRN: [id number]   Chief Complaint  Patient presents with   Back Pain   Foot Pain    HPI  Patient would like a referral to an orthopaedic. He stated they are located in [place]. He c/o LBP and right foot pain. Denies recent fall/trauma.   Back Pain This is a chronic problem. The current episode started more than 1 year ago. The problem occurs constantly (when he wakes up in the morning). The problem is unchanged. The quality of the pain is described as aching. The pain does not radiate. The pain is at a severity of 5/10. The pain is moderate. The pain is Worse during the day. The symptoms are aggravated by bending. Pertinent negatives include no leg pain or numbness. He has tried NSAIDs for the symptoms. The treatment provided moderate relief.  Foot Pain This is a chronic problem. The current episode started more than 1 month ago. The problem has been unchanged. Associated symptoms include arthralgias. Pertinent negatives include no numbness. Exacerbated by: wearing low top sneakers. Treatments tried: biofreeze. The treatment provided moderate relief.    Past Medical History:  Diagnosis Date   Blood clot in abdominal vein    treated at [facility]   Carotid artery disease (HCC)    Dyslipidemia    FHx: heart disease    History of blood transfusion 8-9 yrs ago   HTN (hypertension)     Peripheral vascular disease (HCC)      Family History  Problem Relation Age of Onset   Heart disease Brother    Heart disease Mother    Cancer Father        ? type     Current Outpatient Medications:    albuterol (VENTOLIN HFA) 108 (90 Base) MCG/ACT inhaler, TAKE 2 PUFFS BY MOUTH EVERY 6 HOURS AS NEEDED FOR WHEEZE OR SHORTNESS OF BREATH, Disp: 8.5 each, Rfl: 1   amLODipine (NORVASC) 10 MG tablet, TAKE 1 TABLET BY MOUTH EVERY DAY IN THE MORNING, Disp: 90 tablet, Rfl: 2   aspirin EC 81 MG tablet, Take 81 mg by mouth daily., Disp: , Rfl:    Cholecalciferol (VITAMIN D3) 1000 units CAPS, Take 1 capsule by mouth daily., Disp: , Rfl:    hydrALAZINE (APRESOLINE) 25 MG tablet, Take 1 tablet (25 mg total) by mouth 2 (two) times daily., Disp: 60 tablet, Rfl: 11   Iron-FA-B Cmp-C-Biot-Probiotic (FUSION PLUS) CAPS, TAKE 1 CAPSULE BY MOUTH EVERY DAY, Disp: 30 capsule, Rfl: 1   levothyroxine (SYNTHROID) 125 MCG tablet, Take 1 tablet (125 mcg total) by mouth daily before breakfast., Disp: 30 tablet, Rfl: 1   Multiple Vitamin (MULTIVITAMIN WITH MINERALS) TABS tablet, Take 1 tablet by mouth daily., Disp: , Rfl:    pantoprazole (PROTONIX) 40 MG tablet, TAKE 1 TABLET BY MOUTH EVERY DAY, Disp: 90 tablet, Rfl: 2   sertraline (ZOLOFT) 25 MG tablet, Take 1 tablet (25  mg total) by mouth daily., Disp: 30 tablet, Rfl: 2   simvastatin (ZOCOR) 20 MG tablet, TAKE 1 TABLET BY MOUTH EVERY DAY IN THE EVENING, Disp: 90 tablet, Rfl: 1   valsartan (DIOVAN) 160 MG tablet, TAKE 1 TABLET BY MOUTH EVERY DAY, Disp: 90 tablet, Rfl: 1   Allergies  Allergen Reactions   Lisinopril Cough     Review of Systems  Constitutional: Negative.   Respiratory: Negative.    Cardiovascular: Negative.   Gastrointestinal: Negative.   Musculoskeletal:  Positive for arthralgias and back pain.  Neurological: Negative.  Negative for numbness.  Psychiatric/Behavioral:  Positive for dysphoric mood.        He admits to being under a great deal  of stress which has affected his mood. His SIL lives with he and his wife, and there has been a lot of friction. Unfortunately, at this time,she has nowhere else to stay at this time.     Today's Vitals   03/14/21 1110 03/14/21 1127  BP: (!) 160/70 (!) 150/72  Pulse: 67   Temp: 98 F (36.7 C)   Weight: 146 lb 6.4 oz (66.4 kg)   Height: 5\' 7"  (1.702 m)   PainSc: 0-No pain    Body mass index is 22.93 kg/m.  Wt Readings from Last 3 Encounters:  03/14/21 146 lb 6.4 oz (66.4 kg)  01/17/21 147 lb 3.2 oz (66.8 kg)  12/04/20 146 lb 9.6 oz (66.5 kg)    Objective:  Physical Exam Vitals and nursing note reviewed.  Constitutional:      Appearance: Normal appearance.  HENT:     Head: Normocephalic and atraumatic.     Nose:     Comments: Masked     Mouth/Throat:     Comments: Masked  Eyes:     Extraocular Movements: Extraocular movements intact.  Cardiovascular:     Rate and Rhythm: Normal rate and regular rhythm.     Heart sounds: Normal heart sounds.  Pulmonary:     Effort: Pulmonary effort is normal.     Breath sounds: Normal breath sounds.  Musculoskeletal:        General: Tenderness present.     Cervical back: Normal range of motion.  Skin:    General: Skin is warm.  Neurological:     General: No focal deficit present.     Mental Status: He is alert.  Psychiatric:        Mood and Affect: Mood normal.        Assessment And Plan:     1. Right foot pain Comments: He does not have podiatrist, he prefers to see Ortho for x-rays and further evaluation.  - Ambulatory referral to Orthopedic Surgery  2. Chronic right-sided low back pain without sciatica Comments: He was given stretching exercises to perform daily.  He may also benefit from PT. I will refer him to Ortho for radiographic studies and further evaluation.  - Ambulatory referral to Orthopedic Surgery  3. Essential hypertension, benign Comments: Uncontrolled. He admits he did not take meds today.  Importance of  medication/dietary compliance was stressed to the patient.   4. Adjustment disorder with depressed mood Comments: He would like to take a medication to address this. I wil send rx sertraline 63m daily. Advised to take w/ evening meal, he will rto in six weeks for f/u.     Patient was given opportunity to ask questions. Patient verbalized understanding of the plan and was able to repeat key elements of the plan. All  questions were answered to their satisfaction.   I, [name], MD, have reviewed all documentation for this visit. The documentation on 03/14/21 for the exam, diagnosis, procedures, and orders are all accurate and complete.   IF YOU HAVE BEEN REFERRED TO A SPECIALIST, IT MAY TAKE 1-2 WEEKS TO SCHEDULE/PROCESS THE REFERRAL. IF YOU HAVE NOT HEARD FROM US/SPECIALIST IN TWO WEEKS, PLEASE GIVE [place] A CALL AT [phone] X [phone].   THE PATIENT IS ENCOURAGED TO PRACTICE SOCIAL DISTANCING DUE TO THE COVID-19 PANDEMIC.

## 2021-03-19 MED ORDER — SERTRALINE HCL 25 MG PO TABS: 25.0000 mg | ORAL_TABLET | Freq: Every day | ORAL | 2 refills | Status: DC

## 2021-04-02 ENCOUNTER — Ambulatory Visit: Payer: Medicare Other

## 2021-04-02 ENCOUNTER — Other Ambulatory Visit: Payer: Self-pay

## 2021-04-02 VITALS — BP 150/78 | HR 85 | Temp 97.9°F | Ht 67.0 in | Wt 147.0 lb

## 2021-04-02 DIAGNOSIS — I10 Essential (primary) hypertension: Secondary | ICD-10-CM

## 2021-04-02 NOTE — Patient Instructions (Signed)
Pt presents for BP check. He has yet to take meds today.  Pt advised to adopt the following schedule:  AM- Valsartan/hydralazine  Lunch- hydralazine  PM-amlodipine/hydralazine  Note- hydralazine is now three times daily. Fu with nurse visit in 2 weeks, please take meds prior to your visit.    Hypertension, Adult High blood pressure (hypertension) is when the force of blood pumping through the arteries is too strong. The arteries are the blood vessels that carry blood from the heart throughout the body. Hypertension forces the heart to work harder to pump blood and may cause arteries to become narrow or stiff. Untreated or uncontrolled hypertension can cause a heart attack, heart failure, a stroke, kidney disease, and other problems. A blood pressure reading consists of a higher number over a lower number. Ideally, your blood pressure should be below 120/80. The first ("top") number is called the systolic pressure. It is a measure of the pressure in your arteries as your heart beats. The second ("bottom") number is called the diastolic pressure. It is a measure of the pressure in your arteries as the heart relaxes. What are the causes? The exact cause of this condition is not known. There are some conditions that result in or are related to high blood pressure. What increases the risk? Some risk factors for high blood pressure are under your control. The following factors may make you more likely to develop this condition: Smoking. Having type 2 diabetes mellitus, high cholesterol, or both. Not getting enough exercise or physical activity. Being overweight. Having too much fat, sugar, calories, or salt (sodium) in your diet. Drinking too much alcohol. Some risk factors for high blood pressure may be difficult or impossible to change. Some of these factors include: Having chronic kidney disease. Having a family history of high blood pressure. Age. Risk increases with age. Race. You may be  at higher risk if you are African American. Gender. Men are at higher risk than women before age 26. After age 26, women are at higher risk than men. Having obstructive sleep apnea. Stress. What are the signs or symptoms? High blood pressure may not cause symptoms. Very high blood pressure (hypertensive crisis) may cause: Headache. Anxiety. Shortness of breath. Nosebleed. Nausea and vomiting. Vision changes. Severe chest pain. Seizures. How is this diagnosed? This condition is diagnosed by measuring your blood pressure while you are seated, with your arm resting on a flat surface, your legs uncrossed, and your feet flat on the floor. The cuff of the blood pressure monitor will be placed directly against the skin of your upper arm at the level of your heart. It should be measured at least twice using the same arm. Certain conditions can cause a difference in blood pressure between your right and left arms. Certain factors can cause blood pressure readings to be lower or higher than normal for a short period of time: When your blood pressure is higher when you are in a health care provider's office than when you are at home, this is called white coat hypertension. Most people with this condition do not need medicines. When your blood pressure is higher at home than when you are in a health care provider's office, this is called masked hypertension. Most people with this condition may need medicines to control blood pressure. If you have a high blood pressure reading during one visit or you have normal blood pressure with other risk factors, you may be asked to: Return on a different day to have  your blood pressure checked again. Monitor your blood pressure at home for 1 week or longer. If you are diagnosed with hypertension, you may have other blood or imaging tests to help your health care provider understand your overall risk for other conditions. How is this treated? This condition is  treated by making healthy lifestyle changes, such as eating healthy foods, exercising more, and reducing your alcohol intake. Your health care provider may prescribe medicine if lifestyle changes are not enough to get your blood pressure under control, and if: Your systolic blood pressure is above 130. Your diastolic blood pressure is above 80. Your personal target blood pressure may vary depending on your medical conditions, your age, and other factors. Follow these instructions at home: Eating and drinking  Eat a diet that is high in fiber and potassium, and low in sodium, added sugar, and fat. An example eating plan is called the DASH (Dietary Approaches to Stop Hypertension) diet. To eat this way: Eat plenty of fresh fruits and vegetables. Try to fill one half of your plate at each meal with fruits and vegetables. Eat whole grains, such as whole-wheat pasta, brown rice, or whole-grain bread. Fill about one fourth of your plate with whole grains. Eat or drink low-fat dairy products, such as skim milk or low-fat yogurt. Avoid fatty cuts of meat, processed or cured meats, and poultry with skin. Fill about one fourth of your plate with lean proteins, such as fish, chicken without skin, beans, eggs, or tofu. Avoid pre-made and processed foods. These tend to be higher in sodium, added sugar, and fat. Reduce your daily sodium intake. Most people with hypertension should eat less than 1,500 mg of sodium a day. Do not drink alcohol if: Your health care provider tells you not to drink. You are pregnant, may be pregnant, or are planning to become pregnant. If you drink alcohol: Limit how much you use to: 0-1 drink a day for women. 0-2 drinks a day for men. Be aware of how much alcohol is in your drink. In the [place] one drink equals one 12 oz bottle of beer (355 mL), one 5 oz glass of wine (148 mL), or one 1 oz glass of hard liquor (44 mL). Lifestyle  Work with your health care provider to maintain  a healthy body weight or to lose weight. Ask what an ideal weight is for you. Get at least 30 minutes of exercise most days of the week. Activities may include walking, swimming, or biking. Include exercise to strengthen your muscles (resistance exercise), such as Pilates or lifting weights, as part of your weekly exercise routine. Try to do these types of exercises for 30 minutes at least 3 days a week. Do not use any products that contain nicotine or tobacco, such as cigarettes, e-cigarettes, and chewing tobacco. If you need help quitting, ask your health care provider. Monitor your blood pressure at home as told by your health care provider. Keep all follow-up visits as told by your health care provider. This is important. Medicines Take over-the-counter and prescription medicines only as told by your health care provider. Follow directions carefully. Blood pressure medicines must be taken as prescribed. Do not skip doses of blood pressure medicine. Doing this puts you at risk for problems and can make the medicine less effective. Ask your health care provider about side effects or reactions to medicines that you should watch for. Contact a health care provider if you: Think you are having a reaction to a medicine  you are taking. Have headaches that keep coming back (recurring). Feel dizzy. Have swelling in your ankles. Have trouble with your vision. Get help right away if you: Develop a severe headache or confusion. Have unusual weakness or numbness. Feel faint. Have severe pain in your chest or abdomen. Vomit repeatedly. Have trouble breathing. Summary Hypertension is when the force of blood pumping through your arteries is too strong. If this condition is not controlled, it may put you at risk for serious complications. Your personal target blood pressure may vary depending on your medical conditions, your age, and other factors. For most people, a normal blood pressure is less than  120/80. Hypertension is treated with lifestyle changes, medicines, or a combination of both. Lifestyle changes include losing weight, eating a healthy, low-sodium diet, exercising more, and limiting alcohol. This information is not intended to replace advice given to you by your health care provider. Make sure you discuss any questions you have with your health care provider. Document Revised: 10/28/2017 Document Reviewed: 10/28/2017 Elsevier Patient Education  [place].

## 2021-04-02 NOTE — Progress Notes (Signed)
Pt presents for BP check. He has yet to take meds today.  Pt advised to adopt the following schedule:  AM- Valsartan/hydralazine  Lunch- hydralazine  PM-amlodipine/hydralazine  Note- hydralazine is now three times daily. Fu with nurse visit in 2 weeks, please take meds prior to your visit.   BP Readings from Last 3 Encounters:  04/02/21 (!) 150/78  03/14/21 (!) 150/72  01/17/21 (!) 142/70

## 2021-04-09 ENCOUNTER — Other Ambulatory Visit: Payer: Self-pay | Admitting: Internal Medicine

## 2021-04-12 ENCOUNTER — Other Ambulatory Visit: Payer: Self-pay | Admitting: Internal Medicine

## 2021-04-16 ENCOUNTER — Ambulatory Visit: Payer: Medicare Other

## 2021-04-16 ENCOUNTER — Other Ambulatory Visit: Payer: Self-pay

## 2021-04-16 VITALS — BP 136/78 | HR 72 | Temp 97.6°F | Ht 67.0 in | Wt 145.2 lb

## 2021-04-16 DIAGNOSIS — E039 Hypothyroidism, unspecified: Secondary | ICD-10-CM

## 2021-04-16 NOTE — Progress Notes (Signed)
Patient is here for bpc.  BP Readings from Last 3 Encounters:  04/16/21 136/78  04/02/21 (!) 150/78  03/14/21 (!) 150/72   Pt will continue with current medication.

## 2021-04-17 ENCOUNTER — Other Ambulatory Visit: Payer: Medicare Other

## 2021-04-17 LAB — TSH: TSH: <0.005 u[IU]/mL — ABNORMAL LOW (ref 0.450–4.500)

## 2021-04-18 ENCOUNTER — Other Ambulatory Visit: Payer: Self-pay | Admitting: Internal Medicine

## 2021-04-25 ENCOUNTER — Ambulatory Visit (INDEPENDENT_AMBULATORY_CARE_PROVIDER_SITE_OTHER): Payer: Medicare Other | Admitting: Internal Medicine

## 2021-04-25 ENCOUNTER — Ambulatory Visit (INDEPENDENT_AMBULATORY_CARE_PROVIDER_SITE_OTHER): Payer: Medicare Other

## 2021-04-25 ENCOUNTER — Encounter: Payer: Self-pay | Admitting: Internal Medicine

## 2021-04-25 ENCOUNTER — Other Ambulatory Visit: Payer: Self-pay

## 2021-04-25 VITALS — BP 128/58 | HR 84 | Temp 98.0°F | Ht 67.2 in | Wt 144.0 lb

## 2021-04-25 DIAGNOSIS — I10 Essential (primary) hypertension: Secondary | ICD-10-CM

## 2021-04-25 DIAGNOSIS — E039 Hypothyroidism, unspecified: Secondary | ICD-10-CM | POA: Diagnosis not present

## 2021-04-25 DIAGNOSIS — I779 Disorder of arteries and arterioles, unspecified: Secondary | ICD-10-CM

## 2021-04-25 DIAGNOSIS — Z0000 Encounter for general adult medical examination without abnormal findings: Secondary | ICD-10-CM | POA: Diagnosis not present

## 2021-04-25 NOTE — Progress Notes (Signed)
Shawn Fernandez,acting as a Education administrator for [name], MD.,have documented all relevant documentation on the behalf of [name], MD,as directed by  [name], MD while in the presence of [name], MD.  This visit occurred during the SARS-CoV-2 public health emergency.  Safety protocols were in place, including screening questions prior to the visit, additional usage of staff PPE, and extensive cleaning of exam room while observing appropriate contact time as indicated for disinfecting solutions.  Subjective:     Patient ID: Shawn Fernandez , male    DOB: 05-20-1945 , 76 y.o.   MRN: [id number]   Chief Complaint  Patient presents with   Hypertension    HPI  He presents today for a bp and thyroid recheck. Patient is compliant with his meds. The dose of Synthroid was decreased to 174mcg at his last visit due to low TSH levels. He denies having any palpitations, diarrhea at this time. Of note, he did not experience these symptoms prior to the dose change.  Additionally, the dose of hydralazine was increased to 25mg  tid.   Hypertension This is a chronic problem. The current episode started more than 1 year ago. The problem has been gradually improving since onset. The problem is controlled. Pertinent negatives include no blurred vision, chest pain, headaches, palpitations or shortness of breath. Risk factors for coronary artery disease include male gender and dyslipidemia.    Past Medical History:  Diagnosis Date   Blood clot in abdominal vein    treated at [facility]   Carotid artery disease (HCC)    Dyslipidemia    FHx: heart disease    History of blood transfusion 8-9 yrs ago   HTN (hypertension)    Peripheral vascular disease (HCC)      Family History  Problem Relation Age of Onset   Heart disease Brother    Heart disease Mother    Cancer Father        ? type     Current Outpatient Medications:    albuterol (VENTOLIN HFA) 108 (90 Base) MCG/ACT inhaler,  TAKE 2 PUFFS BY MOUTH EVERY 6 HOURS AS NEEDED FOR WHEEZE OR SHORTNESS OF BREATH, Disp: 8.5 each, Rfl: 1   amLODipine (NORVASC) 10 MG tablet, TAKE 1 TABLET BY MOUTH AT BEDTIME, Disp: 90 tablet, Rfl: 2   aspirin EC 81 MG tablet, Take 81 mg by mouth daily., Disp: , Rfl:    Cholecalciferol (VITAMIN D3) 1000 units CAPS, Take 1 capsule by mouth daily., Disp: , Rfl:    hydrALAZINE (APRESOLINE) 25 MG tablet, Take 1 tablet (25 mg total) by mouth 2 (two) times daily. (Patient taking differently: Take 25 mg by mouth 3 (three) times daily. ONE TABLET BY MOUTH AT BREAKFAST LUNCH AND BEDTIME.), Disp: 60 tablet, Rfl: 11   Iron-FA-B Cmp-C-Biot-Probiotic (FUSION PLUS) CAPS, TAKE 1 CAPSULE BY MOUTH EVERY DAY, Disp: 30 capsule, Rfl: 1   levothyroxine (SYNTHROID) 125 MCG tablet, TAKE 1 TABLET BY MOUTH DAILY BEFORE BREAKFAST., Disp: 30 tablet, Rfl: 1   Multiple Vitamin (MULTIVITAMIN WITH MINERALS) TABS tablet, Take 1 tablet by mouth daily., Disp: , Rfl:    pantoprazole (PROTONIX) 40 MG tablet, TAKE 1 TABLET BY MOUTH EVERY DAY, Disp: 90 tablet, Rfl: 2   sertraline (ZOLOFT) 25 MG tablet, TAKE 1 TABLET (25 MG TOTAL) BY MOUTH DAILY., Disp: 90 tablet, Rfl: 1   simvastatin (ZOCOR) 20 MG tablet, TAKE 1 TABLET BY MOUTH EVERY DAY IN THE EVENING, Disp: 90 tablet, Rfl: 1  valsartan (DIOVAN) 160 MG tablet, TAKE 1 TABLET BY MOUTH EVERY DAY (Patient taking differently: Take 160 mg by mouth daily before breakfast.), Disp: 90 tablet, Rfl: 1   Allergies  Allergen Reactions   Lisinopril Cough     Review of Systems  Constitutional: Negative.   Eyes: Negative.  Negative for blurred vision.  Respiratory: Negative.  Negative for shortness of breath.   Cardiovascular: Negative.  Negative for chest pain and palpitations.  Gastrointestinal: Negative.   Musculoskeletal: Negative.   Skin: Negative.   Neurological: Negative.  Negative for headaches.  Psychiatric/Behavioral: Negative.      Today's Vitals   04/25/21 1427  BP: (!)  128/58  Pulse: 84  Temp: 98 F (36.7 C)  Weight: 143 lb 15.4 oz (65.3 kg)  Height: 5' 7.2" (1.707 m)   Body mass index is 22.41 kg/m.  Wt Readings from Last 3 Encounters:  04/25/21 143 lb 15.4 oz (65.3 kg)  04/25/21 144 lb (65.3 kg)  04/16/21 145 lb 3.2 oz (65.9 kg)     Objective:  Physical Exam Vitals and nursing note reviewed.  Constitutional:      Appearance: Normal appearance. He is obese.  HENT:     Head: Normocephalic and atraumatic.     Nose:     Comments: Masked     Mouth/Throat:     Comments: Masked  Eyes:     Extraocular Movements: Extraocular movements intact.  Cardiovascular:     Rate and Rhythm: Normal rate and regular rhythm.     Heart sounds: Normal heart sounds.  Pulmonary:     Effort: Pulmonary effort is normal.     Breath sounds: Normal breath sounds.  Musculoskeletal:     Cervical back: Normal range of motion.  Skin:    General: Skin is warm.  Neurological:     General: No focal deficit present.     Mental Status: He is alert.  Psychiatric:        Mood and Affect: Mood normal.      Assessment And Plan:     1. Essential hypertension, benign Comments: Chronic, controlled. No med changes. Encouraged to follow low sodium diet.   2. Acquired hypothyroidism Comments: He agrees to rto in 4 weeks for labwork. He will c/w Synthroid 125mcg daily for now.   3. Carotid artery disease, unspecified laterality, unspecified type ([place]) Comments: Chronic, goal LDL <70. Importance of statin compliance was d/w patient.    Patient was given opportunity to ask questions. Patient verbalized understanding of the plan and was able to repeat key elements of the plan. All questions were answered to their satisfaction.   I, [name], MD, have reviewed all documentation for this visit. The documentation on 04/25/21 for the exam, diagnosis, procedures, and orders are all accurate and complete.   IF YOU HAVE BEEN REFERRED TO A SPECIALIST, IT MAY TAKE 1-2 WEEKS TO  SCHEDULE/PROCESS THE REFERRAL. IF YOU HAVE NOT HEARD FROM US/SPECIALIST IN TWO WEEKS, PLEASE GIVE [place] A CALL AT [phone].   THE PATIENT IS ENCOURAGED TO PRACTICE SOCIAL DISTANCING DUE TO THE COVID-19 PANDEMIC.

## 2021-04-25 NOTE — Progress Notes (Signed)
This visit occurred during the SARS-CoV-2 public health emergency.  Safety protocols were in place, including screening questions prior to the visit, additional usage of staff PPE, and extensive cleaning of exam room while observing appropriate contact time as indicated for disinfecting solutions.  Subjective:   Shawn Fernandez is a 76 y.o. male who presents for Medicare Annual/Subsequent preventive examination.  Review of Systems     Cardiac Risk Factors include: advanced age (>1men, >34 women);dyslipidemia;hypertension;male gender     Objective:    Today's Vitals   04/25/21 1407 04/25/21 1414  BP: (!) 128/58   Pulse: 84   Temp: 98 F (36.7 C)   TempSrc: Oral   SpO2: 96%   Weight: 144 lb (65.3 kg)   Height: 5' 7.2" (1.707 m)   PainSc:  0-No pain   Body mass index is 22.42 kg/m.  Advanced Directives 04/25/2021 04/19/2020 09/28/2019 08/09/2019 09/15/2018 12/16/2017 03/21/2016  Does Patient Have a Medical Advance Directive? Yes Yes Yes No Yes Yes No  Type of Paramedic of [place];Living will [place];Living will Living will;Healthcare Power of [place];Living will Living will -  Does patient want to make changes to medical advance directive? - - - - - No - Patient declined -  Copy of [place] in Chart? No - copy requested No - copy requested No - copy requested - No - copy requested - -  Would patient like information on creating a medical advance directive? - - - No - Patient declined - - -    Current Medications (verified) Outpatient Encounter Medications as of 04/25/2021  Medication Sig   albuterol (VENTOLIN HFA) 108 (90 Base) MCG/ACT inhaler TAKE 2 PUFFS BY MOUTH EVERY 6 HOURS AS NEEDED FOR WHEEZE OR SHORTNESS OF BREATH   amLODipine (NORVASC) 10 MG tablet TAKE 1 TABLET BY MOUTH AT BEDTIME   aspirin EC 81 MG tablet Take 81 mg by mouth daily.   Cholecalciferol (VITAMIN D3) 1000 units  CAPS Take 1 capsule by mouth daily.   hydrALAZINE (APRESOLINE) 25 MG tablet Take 1 tablet (25 mg total) by mouth 2 (two) times daily. (Patient taking differently: Take 25 mg by mouth 3 (three) times daily. ONE TABLET BY MOUTH AT BREAKFAST LUNCH AND BEDTIME.)   Iron-FA-B Cmp-C-Biot-Probiotic (FUSION PLUS) CAPS TAKE 1 CAPSULE BY MOUTH EVERY DAY   levothyroxine (SYNTHROID) 125 MCG tablet TAKE 1 TABLET BY MOUTH DAILY BEFORE BREAKFAST.   Multiple Vitamin (MULTIVITAMIN WITH MINERALS) TABS tablet Take 1 tablet by mouth daily.   pantoprazole (PROTONIX) 40 MG tablet TAKE 1 TABLET BY MOUTH EVERY DAY   sertraline (ZOLOFT) 25 MG tablet TAKE 1 TABLET (25 MG TOTAL) BY MOUTH DAILY.   simvastatin (ZOCOR) 20 MG tablet TAKE 1 TABLET BY MOUTH EVERY DAY IN THE EVENING   valsartan (DIOVAN) 160 MG tablet TAKE 1 TABLET BY MOUTH EVERY DAY (Patient taking differently: Take 160 mg by mouth daily before breakfast.)   No facility-administered encounter medications on file as of 04/25/2021.    Allergies (verified) Lisinopril   History: Past Medical History:  Diagnosis Date   Blood clot in abdominal vein    treated at [facility]   Carotid artery disease ([place])    Dyslipidemia    FHx: heart disease    History of blood transfusion 8-9 yrs ago   HTN (hypertension)    Peripheral vascular disease ([place])    Past Surgical History:  Procedure Laterality Date   ANKLE SURGERY  Right yrs ago   arm surgery Left yrs ago   2 rods inserted, 1 rod later removed   COLONOSCOPY WITH PROPOFOL N/A 09/16/2013   Procedure: COLONOSCOPY WITH PROPOFOL;  Surgeon: Beryle Beams, MD;  Location: [facility] ENDOSCOPY;  Service: Endoscopy;  Laterality: N/A;   LOWER EXTREMITY ANGIOGRAM N/A 07/24/2011   Procedure: LOWER EXTREMITY ANGIOGRAM;  Surgeon: Lorretta Harp, MD;  Location: [facility] CATH LAB;  Service: Cardiovascular;  Laterality: N/A;   stent in abdominal vein  4-5 yrs ago   US ECHOCARDIOGRAPHY  07-20-2007   EF 55-60%   Family History  Problem  Relation Age of Onset   Heart disease Brother    Heart disease Mother    Cancer Father        ? type   Social History   Socioeconomic History   Marital status: Married    Spouse name: Not on file   Number of children: Not on file   Years of education: Not on file   Highest education level: Not on file  Occupational History   Occupation: retired  Tobacco Use   Smoking status: Former    Packs/day: 0.75    Years: 20.00    Pack years: 15.00    Types: Cigarettes    Quit date: 07/01/2009    Years since quitting: 11.8   Smokeless tobacco: Never  Vaping Use   Vaping Use: Never used  Substance and Sexual Activity   Alcohol use: Not Currently    Comment: only occ   Drug use: No   Sexual activity: Yes  Other Topics Concern   Not on file  Social History Narrative   Not on file   Social Determinants of Health   Financial Resource Strain: Low Risk    Difficulty of Paying Living Expenses: Not hard at all  Food Insecurity: No Food Insecurity   Worried About Charity fundraiser in the Last Year: Never true   [place] in the Last Year: Never true  Transportation Needs: No Transportation Needs   Lack of Transportation (Medical): No   Lack of Transportation (Non-Medical): No  Physical Activity: Inactive   Days of Exercise per Week: 0 days   Minutes of Exercise per Session: 0 min  Stress: No Stress Concern Present   Feeling of Stress : Not at all  Social Connections: Not on file    Tobacco Counseling Counseling given: Not Answered   Clinical Intake:  Pre-visit preparation completed: Yes  Pain : No/denies pain Pain Score: 0-No pain     Nutritional Status: BMI of 19-24  Normal Nutritional Risks: None Diabetes: No  How often do you need to have someone help you when you read instructions, pamphlets, or other written materials from your doctor or pharmacy?: 1 - Never  Diabetic? no  Interpreter Needed?: No  Information entered by :: NAllen LPN   Activities  of Daily Living In your present state of health, do you have any difficulty performing the following activities: 04/25/2021  Hearing? N  Vision? N  Difficulty concentrating or making decisions? N  Walking or climbing stairs? N  Dressing or bathing? N  Doing errands, shopping? N  Preparing Food and eating ? N  Using the Toilet? N  In the past six months, have you accidently leaked urine? N  Do you have problems with loss of bowel control? N  Managing your Medications? N  Managing your Finances? N  Housekeeping or managing your Housekeeping? N  Some recent data  might be hidden    Patient Care Team: [name], MD as PCP - General (Internal Medicine) Lorretta Harp, MD as PCP - Cardiology (Cardiology) Lorretta Harp, MD as Consulting Physician (Cardiology)  Indicate any recent Medical Services you may have received from other than [facility] providers in the past year (date may be approximate).     Assessment:   This is a routine wellness examination for Gared.  Hearing/Vision screen Vision Screening - Comments:: Regular eye exams, [facility]  Dietary issues and exercise activities discussed: Current Exercise Habits: The patient has a physically strenuous job, but has no regular exercise apart from work.   Goals Addressed             This Visit's Progress    Patient Stated       04/25/2021, get rid of the thyroid problems       Depression Screen PHQ 2/9 Scores 04/25/2021 04/19/2020 09/28/2019 07/13/2019 11/09/2018 11/03/2018 09/15/2018  PHQ - 2 Score 0 0 0 0 0 0 0  PHQ- 9 Score - - 0 - - - 0    Fall Risk Fall Risk  04/25/2021 04/19/2020 09/28/2019 07/13/2019 11/09/2018  Falls in the past year? 0 0 0 0 1  Number falls in past yr: - - - - 0  Injury with Fall? - - - - 0  Risk for fall due to : Medication side effect Medication side effect Medication side effect - -  Follow up Falls evaluation completed;Education provided;Falls prevention discussed Falls evaluation  completed;Education provided;Falls prevention discussed Falls evaluation completed;Education provided;Falls prevention discussed - -    FALL RISK PREVENTION PERTAINING TO THE HOME:  Any stairs in or around the home? Yes  If so, are there any without handrails?  no Home free of loose throw rugs in walkways, pet beds, electrical cords, etc? Yes  Adequate lighting in your home to reduce risk of falls? Yes   ASSISTIVE DEVICES UTILIZED TO PREVENT FALLS:  Life alert? No  Use of a cane, walker or w/c? No  Grab bars in the bathroom? Yes  Shower chair or bench in shower? No  Elevated toilet seat or a handicapped toilet? No   TIMED UP AND GO:  Was the test performed? No .    Gait slow and steady with assistive device  Cognitive Function:     6CIT Screen 04/25/2021 04/19/2020 09/28/2019 09/15/2018 12/16/2017  What Year? 0 points 0 points 0 points 0 points 0 points  What month? 0 points 0 points 0 points 0 points 0 points  What time? 0 points 0 points 0 points 0 points 0 points  Count back from 20 0 points 0 points 0 points 0 points 0 points  Months in reverse 2 points 4 points 4 points 0 points 0 points  Repeat phrase 6 points 2 points 2 points 0 points 0 points  Total Score 8 6 6  0 0    Immunizations Immunization History  Administered Date(s) Administered   Fluad Quad(high Dose 65+) 12/04/2020   Influenza, High Dose Seasonal PF 11/04/2016, 12/02/2017, 01/01/2019, 12/28/2019   Influenza-Unspecified 12/04/2017, 01/01/2019   PFIZER(Purple Top)SARS-COV-2 Vaccination 04/14/2019, 05/09/2019, 01/11/2020, 07/17/2020   Pneumococcal Conjugate-13 12/04/2017   Pneumococcal Polysaccharide-23 11/30/2014   Pneumococcal-Unspecified 11/30/2014    TDAP status: Up to date  Flu Vaccine status: Up to date  Pneumococcal vaccine status: Up to date  Covid-19 vaccine status: Completed vaccines  Qualifies for Shingles Vaccine? Yes   Zostavax completed No   Shingrix Completed?:  No.    Education  has been provided regarding the importance of this vaccine. Patient has been advised to call insurance company to determine out of pocket expense if they have not yet received this vaccine. Advised may also receive vaccine at local pharmacy or Health Dept. Verbalized acceptance and understanding.  Screening Tests Health Maintenance  Topic Date Due   COVID-19 Vaccine (5 - Booster for Pfizer series) 09/11/2020   Zoster Vaccines- Shingrix (1 of 2) 07/23/2021 (Originally 07/08/1964)   TETANUS/TDAP  11/17/2022   COLONOSCOPY (Pts 45-47yrs Insurance coverage will need to be confirmed)  02/08/2029   Pneumonia Vaccine 9+ Years old  Completed   INFLUENZA VACCINE  Completed   Hepatitis C Screening  Completed   HPV VACCINES  Aged Out    Health Maintenance  Health Maintenance Due  Topic Date Due   COVID-19 Vaccine (5 - Booster for Golden series) 09/11/2020    Colorectal cancer screening: Type of screening: Colonoscopy. Completed 02/09/2019. Repeat every 10 years  Lung Cancer Screening: (Low Dose CT Chest recommended if Age 38-80 years, 30 pack-year currently smoking OR have quit w/in 15years.) does not qualify.   Lung Cancer Screening Referral: no  Additional Screening:  Hepatitis C Screening: does qualify; Completed 11/03/2018  Vision Screening: Recommended annual ophthalmology exams for early detection of glaucoma and other disorders of the eye. Is the patient up to date with their annual eye exam?  Yes  Who is the provider or what is the name of the office in which the patient attends annual eye exams? [facility] If pt is not established with a provider, would they like to be referred to a provider to establish care? No .   Dental Screening: Recommended annual dental exams for proper oral hygiene  Community Resource Referral / Chronic Care Management: CRR required this visit?  No   CCM required this visit?  No      Plan:     I have personally reviewed and noted the  following in the patients chart:   Medical and social history Use of alcohol, tobacco or illicit drugs  Current medications and supplements including opioid prescriptions. Patient is not currently taking opioid prescriptions. Functional ability and status Nutritional status Physical activity Advanced directives List of other physicians Hospitalizations, surgeries, and ER visits in previous 12 months Vitals Screenings to include cognitive, depression, and falls Referrals and appointments  In addition, I have reviewed and discussed with patient certain preventive protocols, quality metrics, and best practice recommendations. A written personalized care plan for preventive services as well as general preventive health recommendations were provided to patient.     Kellie Simmering, LPN   [id number]   Nurse Notes: none

## 2021-04-25 NOTE — Patient Instructions (Signed)
Shawn Fernandez , Thank you for taking time to come for your Medicare Wellness Visit. I appreciate your ongoing commitment to your health goals. Please review the following plan we discussed and let me know if I can assist you in the future.   Screening recommendations/referrals: Colonoscopy: completed 02/09/2019, due 02/08/2029 Recommended yearly ophthalmology/optometry visit for glaucoma screening and checkup Recommended yearly dental visit for hygiene and checkup  Vaccinations: Influenza vaccine: completed 12/04/2020, due next flu season Pneumococcal vaccine: completed 12/04/2017 Tdap vaccine: completed 11/16/2012, due 11/17/2022 Shingles vaccine: decline   Covid-19:  07/17/2020, 01/11/2020, 05/09/2019, 04/14/2019  Advanced directives: Please bring a copy of your POA (Power of Attorney) and/or Living Will to your next appointment.   Conditions/risks identified: none  Next appointment: Follow up in one year for your annual wellness visit.   Preventive Care 76 Years and Older, Male Preventive care refers to lifestyle choices and visits with your health care provider that can promote health and wellness. What does preventive care include? A yearly physical exam. This is also called an annual well check. Dental exams once or twice a year. Routine eye exams. Ask your health care provider how often you should have your eyes checked. Personal lifestyle choices, including: Daily care of your teeth and gums. Regular physical activity. Eating a healthy diet. Avoiding tobacco and drug use. Limiting alcohol use. Practicing safe sex. Taking low doses of aspirin every day. Taking vitamin and mineral supplements as recommended by your health care provider. What happens during an annual well check? The services and screenings done by your health care provider during your annual well check will depend on your age, overall health, lifestyle risk factors, and family history of disease. Counseling  Your  health care provider may ask you questions about your: Alcohol use. Tobacco use. Drug use. Emotional well-being. Home and relationship well-being. Sexual activity. Eating habits. History of falls. Memory and ability to understand (cognition). Work and work Statistician. Screening  You may have the following tests or measurements: Height, weight, and BMI. Blood pressure. Lipid and cholesterol levels. These may be checked every 5 years, or more frequently if you are over 69 years old. Skin check. Lung cancer screening. You may have this screening every year starting at age 76 if you have a 30-pack-year history of smoking and currently smoke or have quit within the past 15 years. Fecal occult blood test (FOBT) of the stool. You may have this test every year starting at age 76. Flexible sigmoidoscopy or colonoscopy. You may have a sigmoidoscopy every 5 years or a colonoscopy every 10 years starting at age 76. Prostate cancer screening. Recommendations will vary depending on your family history and other risks. Hepatitis C blood test. Hepatitis B blood test. Sexually transmitted disease (STD) testing. Diabetes screening. This is done by checking your blood sugar (glucose) after you have not eaten for a while (fasting). You may have this done every 1-3 years. Abdominal aortic aneurysm (AAA) screening. You may need this if you are a current or former smoker. Osteoporosis. You may be screened starting at age 19 if you are at high risk. Talk with your health care provider about your test results, treatment options, and if necessary, the need for more tests. Vaccines  Your health care provider may recommend certain vaccines, such as: Influenza vaccine. This is recommended every year. Tetanus, diphtheria, and acellular pertussis (Tdap, Td) vaccine. You may need a Td booster every 10 years. Zoster vaccine. You may need this after age 76. Pneumococcal 13-valent  conjugate (PCV13) vaccine. One dose  is recommended after age 76. Pneumococcal polysaccharide (PPSV23) vaccine. One dose is recommended after age 76. Talk to your health care provider about which screenings and vaccines you need and how often you need them. This information is not intended to replace advice given to you by your health care provider. Make sure you discuss any questions you have with your health care provider. Document Released: 03/16/2015 Document Revised: 11/07/2015 Document Reviewed: 12/19/2014 Elsevier Interactive Patient Education  2017 [place] Prevention in the Home Falls can cause injuries. They can happen to people of all ages. There are many things you can do to make your home safe and to help prevent falls. What can I do on the outside of my home? Regularly fix the edges of walkways and driveways and fix any cracks. Remove anything that might make you trip as you walk through a door, such as a raised step or threshold. Trim any bushes or trees on the path to your home. Use bright outdoor lighting. Clear any walking paths of anything that might make someone trip, such as rocks or tools. Regularly check to see if handrails are loose or broken. Make sure that both sides of any steps have handrails. Any raised decks and porches should have guardrails on the edges. Have any leaves, snow, or ice cleared regularly. Use sand or salt on walking paths during winter. Clean up any spills in your garage right away. This includes oil or grease spills. What can I do in the bathroom? Use night lights. Install grab bars by the toilet and in the tub and shower. Do not use towel bars as grab bars. Use non-skid mats or decals in the tub or shower. If you need to sit down in the shower, use a plastic, non-slip stool. Keep the floor dry. Clean up any water that spills on the floor as soon as it happens. Remove soap buildup in the tub or shower regularly. Attach bath mats securely with double-sided non-slip rug  tape. Do not have throw rugs and other things on the floor that can make you trip. What can I do in the bedroom? Use night lights. Make sure that you have a light by your bed that is easy to reach. Do not use any sheets or blankets that are too big for your bed. They should not hang down onto the floor. Have a firm chair that has side arms. You can use this for support while you get dressed. Do not have throw rugs and other things on the floor that can make you trip. What can I do in the kitchen? Clean up any spills right away. Avoid walking on wet floors. Keep items that you use a lot in easy-to-reach places. If you need to reach something above you, use a strong step stool that has a grab bar. Keep electrical cords out of the way. Do not use floor polish or wax that makes floors slippery. If you must use wax, use non-skid floor wax. Do not have throw rugs and other things on the floor that can make you trip. What can I do with my stairs? Do not leave any items on the stairs. Make sure that there are handrails on both sides of the stairs and use them. Fix handrails that are broken or loose. Make sure that handrails are as long as the stairways. Check any carpeting to make sure that it is firmly attached to the stairs. Fix any carpet that  is loose or worn. Avoid having throw rugs at the top or bottom of the stairs. If you do have throw rugs, attach them to the floor with carpet tape. Make sure that you have a light switch at the top of the stairs and the bottom of the stairs. If you do not have them, ask someone to add them for you. What else can I do to help prevent falls? Wear shoes that: Do not have high heels. Have rubber bottoms. Are comfortable and fit you well. Are closed at the toe. Do not wear sandals. If you use a stepladder: Make sure that it is fully opened. Do not climb a closed stepladder. Make sure that both sides of the stepladder are locked into place. Ask someone to  hold it for you, if possible. Clearly mark and make sure that you can see: Any grab bars or handrails. First and last steps. Where the edge of each step is. Use tools that help you move around (mobility aids) if they are needed. These include: Canes. Walkers. Scooters. Crutches. Turn on the lights when you go into a dark area. Replace any light bulbs as soon as they burn out. Set up your furniture so you have a clear path. Avoid moving your furniture around. If any of your floors are uneven, fix them. If there are any pets around you, be aware of where they are. Review your medicines with your doctor. Some medicines can make you feel dizzy. This can increase your chance of falling. Ask your doctor what other things that you can do to help prevent falls. This information is not intended to replace advice given to you by your health care provider. Make sure you discuss any questions you have with your health care provider. Document Released: 12/14/2008 Document Revised: 07/26/2015 Document Reviewed: 03/24/2014 Elsevier Interactive Patient Education  2017 [employer].

## 2021-04-25 NOTE — Patient Instructions (Signed)

## 2021-04-26 ENCOUNTER — Other Ambulatory Visit: Payer: Self-pay

## 2021-04-26 DIAGNOSIS — D500 Iron deficiency anemia secondary to blood loss (chronic): Secondary | ICD-10-CM

## 2021-04-29 ENCOUNTER — Inpatient Hospital Stay: Payer: Medicare Other | Attending: Hematology | Admitting: Hematology

## 2021-04-29 ENCOUNTER — Inpatient Hospital Stay: Payer: Medicare Other

## 2021-04-29 ENCOUNTER — Other Ambulatory Visit: Payer: Self-pay

## 2021-04-29 VITALS — BP 140/65 | HR 68 | Temp 97.9°F | Resp 15 | Ht 67.2 in | Wt 145.0 lb

## 2021-04-29 DIAGNOSIS — Z87891 Personal history of nicotine dependence: Secondary | ICD-10-CM | POA: Diagnosis not present

## 2021-04-29 DIAGNOSIS — D500 Iron deficiency anemia secondary to blood loss (chronic): Secondary | ICD-10-CM | POA: Diagnosis not present

## 2021-04-29 DIAGNOSIS — D509 Iron deficiency anemia, unspecified: Secondary | ICD-10-CM | POA: Insufficient documentation

## 2021-04-29 DIAGNOSIS — I10 Essential (primary) hypertension: Secondary | ICD-10-CM | POA: Insufficient documentation

## 2021-04-29 DIAGNOSIS — Z809 Family history of malignant neoplasm, unspecified: Secondary | ICD-10-CM | POA: Diagnosis not present

## 2021-04-29 LAB — IRON AND IRON BINDING CAPACITY (CC-WL,HP ONLY)
Iron: 81 ug/dL (ref 45–182)
Saturation Ratios: 23 % (ref 17.9–39.5)
TIBC: 349 ug/dL (ref 250–450)
UIBC: 268 ug/dL (ref 117–376)

## 2021-04-29 LAB — CMP (CANCER CENTER ONLY)
ALT: 8 U/L (ref 0–44)
AST: 23 U/L (ref 15–41)
Albumin: 4.2 g/dL (ref 3.5–5.0)
Alkaline Phosphatase: 111 U/L (ref 38–126)
Anion gap: 6 (ref 5–15)
BUN: 13 mg/dL (ref 8–23)
CO2: 30 mmol/L (ref 22–32)
Calcium: 9.7 mg/dL (ref 8.9–10.3)
Chloride: 105 mmol/L (ref 98–111)
Creatinine: 0.99 mg/dL (ref 0.61–1.24)
GFR, Estimated: >60 mL/min (ref >60)
Glucose, Bld: 93 mg/dL (ref 70–99)
Potassium: 4 mmol/L (ref 3.5–5.1)
Sodium: 141 mmol/L (ref 135–145)
Total Bilirubin: 0.5 mg/dL (ref 0.3–1.2)
Total Protein: 7.7 g/dL (ref 6.5–8.1)

## 2021-04-29 LAB — CBC WITH DIFFERENTIAL (CANCER CENTER ONLY)
Abs Immature Granulocytes: 0.02 10*3/uL (ref 0.00–0.07)
Basophils Absolute: 0.1 10*3/uL (ref 0.0–0.1)
Basophils Relative: 1 %
Eosinophils Absolute: 0.2 10*3/uL (ref 0.0–0.5)
Eosinophils Relative: 3 %
HCT: 41.2 % (ref 39.0–52.0)
Hemoglobin: 13.3 g/dL (ref 13.0–17.0)
Immature Granulocytes: 0 %
Lymphocytes Relative: 19 %
Lymphs Abs: 1.6 10*3/uL (ref 0.7–4.0)
MCH: 28.5 pg (ref 26.0–34.0)
MCHC: 32.3 g/dL (ref 30.0–36.0)
MCV: 88.2 fL (ref 80.0–100.0)
Monocytes Absolute: 0.7 10*3/uL (ref 0.1–1.0)
Monocytes Relative: 9 %
Neutro Abs: 5.8 10*3/uL (ref 1.7–7.7)
Neutrophils Relative %: 68 %
Platelet Count: 201 10*3/uL (ref 150–400)
RBC: 4.67 MIL/uL (ref 4.22–5.81)
RDW: 12.9 % (ref 11.5–15.5)
WBC Count: 8.4 10*3/uL (ref 4.0–10.5)
nRBC: 0 % (ref 0.0–0.2)

## 2021-04-29 LAB — FERRITIN: Ferritin: 59 ng/mL (ref 24–336)

## 2021-04-29 LAB — SAMPLE TO BLOOD BANK

## 2021-04-30 ENCOUNTER — Ambulatory Visit: Payer: Medicare Other | Admitting: Internal Medicine

## 2021-05-01 ENCOUNTER — Other Ambulatory Visit: Payer: Self-pay

## 2021-05-01 ENCOUNTER — Telehealth: Payer: Self-pay | Admitting: Hematology

## 2021-05-01 ENCOUNTER — Ambulatory Visit
Admission: EM | Admit: 2021-05-01 | Discharge: 2021-05-01 | Disposition: A | Discharge status: Home / Self Care | Payer: Medicare Other | Attending: Internal Medicine | Admitting: Internal Medicine

## 2021-05-01 DIAGNOSIS — R051 Acute cough: Secondary | ICD-10-CM

## 2021-05-01 DIAGNOSIS — J069 Acute upper respiratory infection, unspecified: Secondary | ICD-10-CM | POA: Diagnosis not present

## 2021-05-01 MED ORDER — FLUTICASONE PROPIONATE 50 MCG/ACT NA SUSP: 1.0000 | Freq: Every day | NASAL | 0 refills | Status: DC

## 2021-05-01 MED ORDER — DOXYCYCLINE HYCLATE 100 MG PO CAPS: 100.0000 mg | ORAL_CAPSULE | Freq: Two times a day (BID) | ORAL | 0 refills | Status: DC

## 2021-05-01 MED ORDER — BENZONATATE 100 MG PO CAPS: 100.0000 mg | ORAL_CAPSULE | Freq: Three times a day (TID) | ORAL | 0 refills | Status: DC | PRN

## 2021-05-01 NOTE — Telephone Encounter (Signed)
Left message with follow-up appointment per 2/27 los. ?

## 2021-05-01 NOTE — ED Provider Notes (Signed)
[name]    CSN: [id number] Arrival date & time: 05/01/21  1157      History   Chief Complaint Chief Complaint  Patient presents with   Cough    HPI Shawn Fernandez is a 76 y.o. male.   Patient presents with 1 week history of cough, sneezing, nasal congestion, runny nose.  He reports that his wife has had similar symptoms recently.  Patient has taken over-the-counter cough and cold medications as well as cough drops with minimal improvement in symptoms.  Denies any known fevers.  Denies chest pain, shortness of breath, nausea, vomiting, diarrhea, abdominal pain.  Patient denies history of asthma or COPD but reports that he is a smoker.   Cough  Past Medical History:  Diagnosis Date   Blood clot in abdominal vein    treated at [facility]   Carotid artery disease ([place])    Dyslipidemia    FHx: heart disease    History of blood transfusion 8-9 yrs ago   HTN (hypertension)    Peripheral vascular disease ([place])     Patient Active Problem List   Diagnosis Date Noted   Acquired hypothyroidism 11/21/2017   Iron deficiency anemia due to chronic blood loss 03/29/2015   Anemia 03/15/2015   Hyperthyroidism 01/04/2015   Claudication ([place]) 06/29/2014   Essential hypertension 10/27/2012   Hyperlipidemia 10/27/2012   Peripheral arterial disease ([place]) 10/27/2012   Carotid artery disease ([place]) 10/27/2012   Cough 11/06/2011    Past Surgical History:  Procedure Laterality Date   ANKLE SURGERY Right yrs ago   arm surgery Left yrs ago   2 rods inserted, 1 rod later removed   COLONOSCOPY WITH PROPOFOL N/A 09/16/2013   Procedure: COLONOSCOPY WITH PROPOFOL;  Surgeon: Beryle Beams, MD;  Location: [facility];  Service: Endoscopy;  Laterality: N/A;   LOWER EXTREMITY ANGIOGRAM N/A 07/24/2011   Procedure: LOWER EXTREMITY ANGIOGRAM;  Surgeon: Lorretta Harp, MD;  Location: [facility] CATH LAB;  Service: Cardiovascular;  Laterality: N/A;   stent in abdominal vein  4-5 yrs ago   US  ECHOCARDIOGRAPHY  07-20-2007   EF 55-60%       Home Medications    Prior to Admission medications   Medication Sig Start Date End Date Taking? Authorizing Provider  benzonatate (TESSALON) 100 MG capsule Take 1 capsule (100 mg total) by mouth every 8 (eight) hours as needed for cough. 05/01/21  Yes Sujata Maines, Hildred Alamin E, FNP  doxycycline (VIBRAMYCIN) 100 MG capsule Take 1 capsule (100 mg total) by mouth 2 (two) times daily. 05/01/21  Yes Maricus Tanzi, Hildred Alamin E, FNP  fluticasone (FLONASE) 50 MCG/ACT nasal spray Place 1 spray into both nostrils daily for 3 days. 05/01/21 05/04/21 Yes Skyann Ganim, Hildred Alamin E, FNP  albuterol (VENTOLIN HFA) 108 (90 Base) MCG/ACT inhaler TAKE 2 PUFFS BY MOUTH EVERY 6 HOURS AS NEEDED FOR WHEEZE OR SHORTNESS OF BREATH 07/24/20   Ghumman, Ramandeep, NP  amLODipine (NORVASC) 10 MG tablet TAKE 1 TABLET BY MOUTH AT BEDTIME 04/15/21   [name], MD  aspirin EC 81 MG tablet Take 81 mg by mouth daily.    [provider]  Cholecalciferol (VITAMIN D3) 1000 units CAPS Take 1 capsule by mouth daily.    [provider]  hydrALAZINE (APRESOLINE) 25 MG tablet Take 1 tablet (25 mg total) by mouth 2 (two) times daily. Patient taking differently: Take 25 mg by mouth 3 (three) times daily. ONE TABLET BY MOUTH AT BREAKFAST LUNCH AND BEDTIME. 07/03/20 07/03/21  [name],  MD  Iron-FA-B Cmp-C-Biot-Probiotic (FUSION PLUS) CAPS TAKE 1 CAPSULE BY MOUTH EVERY DAY 10/24/20   [name], MD  levothyroxine (SYNTHROID) 125 MCG tablet TAKE 1 TABLET BY MOUTH DAILY BEFORE BREAKFAST. 04/09/21   [name], MD  Multiple Vitamin (MULTIVITAMIN WITH MINERALS) TABS tablet Take 1 tablet by mouth daily.    [provider]  pantoprazole (PROTONIX) 40 MG tablet TAKE 1 TABLET BY MOUTH EVERY DAY 10/29/20   [name], MD  sertraline (ZOLOFT) 25 MG tablet TAKE 1 TABLET (25 MG TOTAL) BY MOUTH DAILY. 04/18/21 04/18/22  [name], MD  simvastatin (ZOCOR) 20 MG tablet TAKE 1 TABLET BY MOUTH EVERY DAY IN  THE EVENING 01/31/21   [name], MD  valsartan (DIOVAN) 160 MG tablet TAKE 1 TABLET BY MOUTH EVERY DAY Patient taking differently: Take 160 mg by mouth daily before breakfast. 10/24/20   [name], MD    Family History Family History  Problem Relation Age of Onset   Heart disease Brother    Heart disease Mother    Cancer Father        ? type    Social History Social History   Tobacco Use   Smoking status: Former    Packs/day: 0.75    Years: 20.00    Pack years: 15.00    Types: Cigarettes    Quit date: 07/01/2009    Years since quitting: 11.8   Smokeless tobacco: Never  Vaping Use   Vaping Use: Never used  Substance Use Topics   Alcohol use: Not Currently    Comment: only occ   Drug use: No     Allergies   Lisinopril   Review of Systems Review of Systems Per HPI  Physical Exam Triage Vital Signs ED Triage Vitals  Enc Vitals Group     BP 05/01/21 1211 (!) 162/73     Pulse Rate 05/01/21 1211 67     Resp 05/01/21 1211 18     Temp 05/01/21 1211 98 F (36.7 C)     Temp Source 05/01/21 1211 Oral     SpO2 05/01/21 1211 100 %     Weight --      Height --      Head Circumference --      Peak Flow --      Pain Score 05/01/21 1214 0     Pain Loc --      Pain Edu? --      Excl. in [place]? --    No data found.  Updated Vital Signs BP (!) 162/73 (BP Location: Left Arm)    Pulse 67    Temp 98 F (36.7 C) (Oral)    Resp 18    SpO2 100%   Visual Acuity Right Eye Distance:   Left Eye Distance:   Bilateral Distance:    Right Eye Near:   Left Eye Near:    Bilateral Near:     Physical Exam Constitutional:      General: He is not in acute distress.    Appearance: Normal appearance. He is not toxic-appearing or diaphoretic.  HENT:     Head: Normocephalic and atraumatic.     Right Ear: Tympanic membrane and ear canal normal.     Left Ear: Tympanic membrane and ear canal normal.     Nose: Congestion present.     Mouth/Throat:     Mouth: Mucous  membranes are moist.     Pharynx: No posterior oropharyngeal erythema.  Eyes:  Extraocular Movements: Extraocular movements intact.     Conjunctiva/sclera: Conjunctivae normal.     Pupils: Pupils are equal, round, and reactive to light.  Cardiovascular:     Rate and Rhythm: Normal rate and regular rhythm.     Pulses: Normal pulses.     Heart sounds: Normal heart sounds.  Pulmonary:     Effort: Pulmonary effort is normal. No respiratory distress.     Breath sounds: Normal breath sounds. No stridor. No wheezing, rhonchi or rales.  Abdominal:     General: Abdomen is flat. Bowel sounds are normal.     Palpations: Abdomen is soft.  Musculoskeletal:        General: Normal range of motion.     Cervical back: Normal range of motion.  Skin:    General: Skin is warm and dry.  Neurological:     General: No focal deficit present.     Mental Status: He is alert and oriented to person, place, and time. Mental status is at baseline.  Psychiatric:        Mood and Affect: Mood normal.        Behavior: Behavior normal.     UC Treatments / Results  Labs (all labs ordered are listed, but only abnormal results are displayed) Labs Reviewed  NOVEL CORONAVIRUS, NAA    EKG   Radiology No results found.  Procedures Procedures (including critical care time)  Medications Ordered in UC Medications - No data to display  Initial Impression / Assessment and Plan / UC Course  I have reviewed the triage vital signs and the nursing notes.  Pertinent labs & imaging results that were available during my care of the patient were reviewed by me and considered in my medical decision making (see chart for details).     Patient presents with symptoms likely from a viral upper respiratory infection. Differential includes bacterial pneumonia, sinusitis, allergic rhinitis, COVID-19. Do not suspect underlying cardiopulmonary process. Symptoms seem unlikely related to ACS, CHF or COPD exacerbations,  pneumonia, pneumothorax. Patient is nontoxic appearing and not in need of emergent medical intervention.  No suspicion for pneumonia given no adventitious lung sounds on exam and no signs of respiratory compromise so will defer imaging at this time.  Recommended symptom control with over the counter medications.  Patient sent prescriptions.  Will cover with doxycycline given that patient is a smoker and to cover for atypicals  Return if symptoms fail to improve in 1-2 weeks or you develop shortness of breath, chest pain, severe headache. Patient states understanding and is agreeable.  Discharged with PCP followup.  Final Clinical Impressions(s) / UC Diagnoses   Final diagnoses:  Acute upper respiratory infection  Acute cough     Discharge Instructions      You have been prescribed an antibiotic, a cough medication, and a nasal spray.  Please take antibiotic with food to avoid nausea.  Follow-up if symptoms persist or worsen.    ED Prescriptions     Medication Sig Dispense Auth. Provider   doxycycline (VIBRAMYCIN) 100 MG capsule Take 1 capsule (100 mg total) by mouth 2 (two) times daily. 20 capsule [place], [place] E, [place]   fluticasone [facility]) 50 MCG/ACT nasal spray Place 1 spray into both nostrils daily for 3 days. 16 g Haroldine Redler, Hildred Alamin E, [place]   benzonatate (TESSALON) 100 MG capsule Take 1 capsule (100 mg total) by mouth every 8 (eight) hours as needed for cough. 21 capsule [place], Michele Rockers, [place]  PDMP not reviewed this encounter.   Teodora Medici, [place] 05/01/21 1224

## 2021-05-01 NOTE — Discharge Instructions (Signed)
You have been prescribed an antibiotic, a cough medication, and a nasal spray.  Please take antibiotic with food to avoid nausea.  Follow-up if symptoms persist or worsen. ?

## 2021-05-01 NOTE — ED Triage Notes (Signed)
One week h/o cough, frequent sneezing, congestion and runny nose. Has been taking otc cough meds and using cough drops with minimal relief. No v/d. ?

## 2021-05-02 LAB — NOVEL CORONAVIRUS, NAA: SARS-CoV-2, NAA: NOT DETECTED

## 2021-05-05 ENCOUNTER — Encounter: Payer: Self-pay | Admitting: Hematology

## 2021-05-05 NOTE — Progress Notes (Signed)
HEMATOLOGY/ONCOLOGY CLINIC NOTE  Date of Service: 04/29/2021 Follow-up iron deficiency anemia  Patient Care Team: [name], MD as PCP - General (Internal Medicine) Gwenlyn Found Pearletha Forge, MD as PCP - Cardiology (Cardiology) Lorretta Harp, MD as Consulting Physician (Cardiology)   Endocrinologist: Dr. Jacelyn Pi  CHIEF COMPLAINTS/PURPOSE OF CONSULTATION:  F/u for IDA  DIAGNOSIS  Microcytic hypochromic Anemia with MCV 79.4 due to Iron deficiency. Ferritin 27, Iron sat too low to calculate. No overt evidence of bleeding. Patient notes recent fecal occult blood neg x 1. He has had significant iron deficiency anemia in the past in 2011 with a ferritin level of 9 and required a blood transfusion. EGD showed duodenitis and hiatal hernia. Patient remains at high risk of GI bleeding since he is on ASA+ plavix for his endovascular stent graft for repaired AAA and is on Cilostazole for PAD. Some element of anemia also appears to be temporally related to methimazole use (though agranulocytosis is the usual concern and isolated anemia is less usual) The hyperthyroidism itself can also contribute to the anemia until corrected. SPEP with no M spike No evidence of hemolysis B12/folate WNL  Current treatment -p.o. iron infusion plus -previously IV feraheme. Continue prn IV feraheme.  HISTORY OF PRESENTING ILLNESS: plz see initial consultation for details on initial presentation   INTERVAL HISTORY:  Shawn Fernandez is a 76 y.o. male of his is here for continued evaluation and management iron deficiency anemia. He notes no overt GI bleeding, black stools or bright red blood in the stool since his last clinic visit. He notes he is no longer on Plavix and only taking aspirin at this time. Continues to take iron infusion plus orally. No new fatigue lightheadedness or dizziness. Labs done today reviewed with patient in detail.  MEDICAL HISTORY:   Past Medical History:   Diagnosis Date   Blood clot in abdominal vein    treated at [facility]   Carotid artery disease ([place])    Dyslipidemia    FHx: heart disease    History of blood transfusion 8-9 yrs ago   HTN (hypertension)    Peripheral vascular disease ([place])    Hyperthyroidism -being followed by Dr. Chalmers Cater - was previously on methimazole for 6-8 months and has been off for a few weeks now. He was treated with radioactive iodine about one week ago.  He had presented with progressive weight loss from 2 years prior to diagnosis.  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  Previous history of iron deficiency anemia ferritin was 9 about 5 years ago in 2011  SURGICAL HISTORY: Past Surgical History:  Procedure Laterality Date   ANKLE SURGERY Right yrs ago   arm surgery Left yrs ago   2 rods inserted, 1 rod later removed   COLONOSCOPY WITH PROPOFOL N/A 09/16/2013   Procedure: COLONOSCOPY WITH PROPOFOL;  Surgeon: Beryle Beams, MD;  Location: [facility];  Service: Endoscopy;  Laterality: N/A;   LOWER EXTREMITY ANGIOGRAM N/A 07/24/2011   Procedure: LOWER EXTREMITY ANGIOGRAM;  Surgeon: Lorretta Harp, MD;  Location: [facility] CATH LAB;  Service: Cardiovascular;  Laterality: N/A;   stent in abdominal vein  4-5 yrs ago   US ECHOCARDIOGRAPHY  07-20-2007   EF 55-60%    SOCIAL HISTORY: Social History   Socioeconomic History   Marital status: Married    Spouse name: Not on file   Number of children: Not on file   Years of education: Not  on file   Highest education level: Not on file  Occupational History   Occupation: retired  Tobacco Use   Smoking status: Former    Packs/day: 0.75    Years: 20.00    Pack years: 15.00    Types: Cigarettes    Quit date: 07/01/2009    Years since quitting: 11.8   Smokeless tobacco: Never  Vaping Use   Vaping Use: Never used  Substance and Sexual Activity   Alcohol use: Not Currently    Comment: only occ   Drug use:  No   Sexual activity: Yes  Other Topics Concern   Not on file  Social History Narrative   Not on file   Social Determinants of Health   Financial Resource Strain: Low Risk    Difficulty of Paying Living Expenses: Not hard at all  Food Insecurity: No Food Insecurity   Worried About Charity fundraiser in the Last Year: Never true   [place] in the Last Year: Never true  Transportation Needs: No Transportation Needs   Lack of Transportation (Medical): No   Lack of Transportation (Non-Medical): No  Physical Activity: Inactive   Days of Exercise per Week: 0 days   Minutes of Exercise per Session: 0 min  Stress: No Stress Concern Present   Feeling of Stress : Not at all  Social Connections: Not on file  Intimate Partner Violence: Not on file    FAMILY HISTORY: Family History  Problem Relation Age of Onset   Heart disease Brother    Heart disease Mother    Cancer Father        ? type    ALLERGIES:  is allergic to lisinopril.  MEDICATIONS:  Current Outpatient Medications  Medication Sig Dispense Refill   albuterol (VENTOLIN HFA) 108 (90 Base) MCG/ACT inhaler TAKE 2 PUFFS BY MOUTH EVERY 6 HOURS AS NEEDED FOR WHEEZE OR SHORTNESS OF BREATH 8.5 each 1   amLODipine (NORVASC) 10 MG tablet TAKE 1 TABLET BY MOUTH AT BEDTIME 90 tablet 2   aspirin EC 81 MG tablet Take 81 mg by mouth daily.     benzonatate (TESSALON) 100 MG capsule Take 1 capsule (100 mg total) by mouth every 8 (eight) hours as needed for cough. 21 capsule 0   Cholecalciferol (VITAMIN D3) 1000 units CAPS Take 1 capsule by mouth daily.     doxycycline (VIBRAMYCIN) 100 MG capsule Take 1 capsule (100 mg total) by mouth 2 (two) times daily. 20 capsule 0   fluticasone (FLONASE) 50 MCG/ACT nasal spray Place 1 spray into both nostrils daily for 3 days. 16 g 0   hydrALAZINE (APRESOLINE) 25 MG tablet Take 1 tablet (25 mg total) by mouth 2 (two) times daily. (Patient taking differently: Take 25 mg by mouth 3 (three) times  daily. ONE TABLET BY MOUTH AT BREAKFAST LUNCH AND BEDTIME.) 60 tablet 11   Iron-FA-B Cmp-C-Biot-Probiotic (FUSION PLUS) CAPS TAKE 1 CAPSULE BY MOUTH EVERY DAY 30 capsule 1   levothyroxine (SYNTHROID) 125 MCG tablet TAKE 1 TABLET BY MOUTH DAILY BEFORE BREAKFAST. 30 tablet 1   Multiple Vitamin (MULTIVITAMIN WITH MINERALS) TABS tablet Take 1 tablet by mouth daily.     pantoprazole (PROTONIX) 40 MG tablet TAKE 1 TABLET BY MOUTH EVERY DAY 90 tablet 2   sertraline (ZOLOFT) 25 MG tablet TAKE 1 TABLET (25 MG TOTAL) BY MOUTH DAILY. 90 tablet 1   simvastatin (ZOCOR) 20 MG tablet TAKE 1 TABLET BY MOUTH EVERY DAY IN THE EVENING 90  tablet 1   valsartan (DIOVAN) 160 MG tablet TAKE 1 TABLET BY MOUTH EVERY DAY (Patient taking differently: Take 160 mg by mouth daily before breakfast.) 90 tablet 1   No current facility-administered medications for this visit.    REVIEW OF SYSTEMS:   10 Point review of Systems was done is negative except as noted above.  PHYSICAL EXAMINATION: ECOG FS:2 - Symptomatic, <50% confined to bed  Vitals:   04/29/21 1417  BP: 140/65  Pulse: 68  Resp: 15  Temp: 97.9 F (36.6 C)  SpO2: 99%    Wt Readings from Last 3 Encounters:  04/29/21 145 lb (65.8 kg)  04/25/21 143 lb 15.4 oz (65.3 kg)  04/25/21 144 lb (65.3 kg)   Body mass index is 22.58 kg/m.   NAD GENERAL:alert, in no acute distress and comfortable NECK: supple, no JVD LYMPH:  no palpable lymphadenopathy in the cervical, axillary or inguinal regions LUNGS: clear to auscultation b/l with normal respiratory effort HEART: regular rate & rhythm ABDOMEN:  normoactive bowel sounds , non tender, not distended. Extremity: no pedal edema PSYCH: alert & oriented x 3 with fluent speech   LABORATORY DATA:  I have reviewed the data as listed  . CBC Latest Ref Rng & Units 04/29/2021 01/17/2021 10/31/2020  WBC 4.0 - 10.5 K/uL 8.4 6.6 5.7  Hemoglobin 13.0 - 17.0 g/dL 13.3 12.9(L) 12.3(L)  Hematocrit 39.0 - 52.0 % 41.2  41.2 41.0  Platelets 150 - 400 K/uL 201 193 193   . CBC    Component Value Date/Time   WBC 8.4 04/29/2021 1337   WBC 3.6 (L) 09/06/2020 1005   RBC 4.67 04/29/2021 1337   HGB 13.3 04/29/2021 1337   HGB 12.9 (L) 01/17/2021 1527   HGB 13.9 03/21/2016 0919   HCT 41.2 04/29/2021 1337   HCT 41.2 01/17/2021 1527   HCT 43.1 03/21/2016 0919   PLT 201 04/29/2021 1337   PLT 193 01/17/2021 1527   MCV 88.2 04/29/2021 1337   MCV 86 01/17/2021 1527   MCV 89.2 03/21/2016 0919   MCH 28.5 04/29/2021 1337   MCHC 32.3 04/29/2021 1337   RDW 12.9 04/29/2021 1337   RDW 12.2 01/17/2021 1527   RDW 13.1 03/21/2016 0919   LYMPHSABS 1.6 04/29/2021 1337   LYMPHSABS 2.1 11/03/2018 1626   LYMPHSABS 2.5 03/21/2016 0919   MONOABS 0.7 04/29/2021 1337   MONOABS 0.8 03/21/2016 0919   EOSABS 0.2 04/29/2021 1337   EOSABS 0.2 11/03/2018 1626   BASOSABS 0.1 04/29/2021 1337   BASOSABS 0.1 11/03/2018 1626   BASOSABS 0.1 03/21/2016 0919    CMP Latest Ref Rng & Units 04/29/2021 01/17/2021 10/31/2020  Glucose 70 - 99 mg/dL 93 87 86  BUN 8 - 23 mg/dL 13 11 7(L)  Creatinine 0.61 - 1.24 mg/dL 0.99 0.93 0.85  Sodium 135 - 145 mmol/L 141 142 145  Potassium 3.5 - 5.1 mmol/L 4.0 4.2 3.9  Chloride 98 - 111 mmol/L 105 103 113(H)  CO2 22 - 32 mmol/L '30 23 24  '$ Calcium 8.9 - 10.3 mg/dL 9.7 9.5 9.3  Total Protein 6.5 - 8.1 g/dL 7.7 - 7.4  Total Bilirubin 0.3 - 1.2 mg/dL 0.5 - 0.5  Alkaline Phos 38 - 126 U/L 111 - 119  AST 15 - 41 U/L 23 - 22  ALT 0 - 44 U/L 8 - 11   . Lab Results  Component Value Date   IRON 81 04/29/2021   TIBC 349 04/29/2021   IRONPCTSAT 23 04/29/2021   (Iron  and TIBC)  Lab Results  Component Value Date   FERRITIN 59 04/29/2021   RADIOGRAPHIC STUDIES: I have personally reviewed the radiological images as listed and agreed with the findings in the report. No results found.   ASSESSMENT & PLAN:   76 yo AAM with   1) Microcytic hypochromic Anemia due to Iron deficiency - now  resolved.  Previously had Ferritin 27, Iron sat too low to calculate. No overt evidence of bleeding. Patient notes recent fecal occult blood neg x 1. He has had significant iron deficiency anemia in the past in 2011 with a ferritin level of 9 and required a blood transfusion. EGD showed duodenitis and hiatal hernia. Patient was previously on ASA+ plavix for his endovascular stent graft for repaired AAA and is on Cilostazole for PAD.  He is currently only on aspirin. Some element of anemia also appears to be temporally related to methimazole use (though agranulocytosis is the usual concern and isolated anemia is less usual) The hyperthyroidism itself can also contribute to the anemia until corrected. SPEP with no M spike No evidence of hemolysis B12/folate WNL  PLAN: -Patient's lab results from today discussed in detail. CBC shows normal hemoglobin of 13.3 with normal WBC count and platelets. CMP unremarkable Ferritin 59 with an iron saturation of 23%. Discussed that his previous ferritin goal is more than 100. -He notes he is only on aspirin now and is off Plavix and cilostazol and has had no additional GI bleeding.  At this time he prefers to continue his oral iron and hold off on IV iron infusion. -Would recommend he get labs with his primary care physician in 3 months including CBC and iron labs. -We shall see him back in 6 months with labs -Continue follow-up with PCP and GI for monitoring of GI bleeding. Patient still on Zoloft which can also cause platelet dysfunction and increased risk of bleeding.   FOLLOW UP: RTC with Dr Irene Limbo with labs in 6 months F/u with PCP for labs in 3 months  The total time spent in the appointment was 20 minutes*.  All of the patient's questions were answered with apparent satisfaction. The patient knows to call the clinic with any problems, questions or concerns.   Sullivan Lone MD MS AAHIVMS [facility] Hematology/Oncology Physician [facility]  .*Total Encounter Time as defined by the Centers for Medicare and Medicaid Services includes, in addition to the face-to-face time of a patient visit (documented in the note above) non-face-to-face time: obtaining and reviewing outside history, ordering and reviewing medications, tests or procedures, care coordination (communications with other health care professionals or caregivers) and documentation in the medical record.

## 2021-05-15 ENCOUNTER — Other Ambulatory Visit: Payer: Self-pay | Admitting: Internal Medicine

## 2021-05-28 ENCOUNTER — Ambulatory Visit: Payer: Medicare Other

## 2021-05-28 ENCOUNTER — Other Ambulatory Visit: Payer: Self-pay

## 2021-05-28 VITALS — BP 144/60 | HR 60 | Temp 97.8°F | Ht 67.2 in | Wt 142.6 lb

## 2021-05-28 DIAGNOSIS — I10 Essential (primary) hypertension: Secondary | ICD-10-CM

## 2021-05-28 NOTE — Progress Notes (Signed)
Patient presents for BPC. He is currently taking Amolidipine '10mg'$  hydralazine 25 3 times a day and valsartan '160mg'$ .  ?BP Readings from Last 3 Encounters:  ?05/28/21 (!) 144/60  ?05/01/21 (!) 162/73  ?04/29/21 140/65  ? ?Patient encouraged to limit salt intake. He will see provider for follow up.  ?

## 2021-05-29 ENCOUNTER — Other Ambulatory Visit: Payer: Self-pay | Admitting: Internal Medicine

## 2021-07-04 ENCOUNTER — Encounter: Payer: Self-pay | Admitting: Internal Medicine

## 2021-07-04 ENCOUNTER — Ambulatory Visit (INDEPENDENT_AMBULATORY_CARE_PROVIDER_SITE_OTHER): Payer: Medicare Other | Admitting: Internal Medicine

## 2021-07-04 VITALS — BP 148/88 | HR 69 | Temp 97.8°F | Ht 67.2 in | Wt 144.4 lb

## 2021-07-04 DIAGNOSIS — I10 Essential (primary) hypertension: Secondary | ICD-10-CM

## 2021-07-04 DIAGNOSIS — E039 Hypothyroidism, unspecified: Secondary | ICD-10-CM | POA: Diagnosis not present

## 2021-07-04 DIAGNOSIS — Z6822 Body mass index (BMI) 22.0-22.9, adult: Secondary | ICD-10-CM | POA: Diagnosis not present

## 2021-07-04 NOTE — Progress Notes (Signed)
[name],acting as a Education administrator for [name], MD.,have documented all relevant documentation on the behalf of [name], MD,as directed by  [name], MD while in the presence of [name], MD.  This visit occurred during the SARS-CoV-2 public health emergency.  Safety protocols were in place, including screening questions prior to the visit, additional usage of staff PPE, and extensive cleaning of exam room while observing appropriate contact time as indicated for disinfecting solutions.  Subjective:     Patient ID: Shawn Fernandez , male    DOB: 10/17/1945 , 76 y.o.   MRN: [id number]   Chief Complaint  Patient presents with   Hypertension   Hypothyroidism    HPI  He presents today for a bp and thyroid recheck. Patient is compliant with his meds. He reports feeling well. States he walks regularly with his wife.  Hypertension This is a chronic problem. The current episode started more than 1 year ago. The problem has been gradually improving since onset. The problem is controlled. Pertinent negatives include no blurred vision, chest pain, headaches, palpitations or shortness of breath. Risk factors for coronary artery disease include male gender and dyslipidemia.    Past Medical History:  Diagnosis Date   Blood clot in abdominal vein    treated at [facility]   Carotid artery disease (HCC)    Dyslipidemia    FHx: heart disease    History of blood transfusion 8-9 yrs ago   HTN (hypertension)    Peripheral vascular disease (HCC)      Family History  Problem Relation Age of Onset   Heart disease Brother    Heart disease Mother    Cancer Father        ? type     Current Outpatient Medications:    albuterol (VENTOLIN HFA) 108 (90 Base) MCG/ACT inhaler, TAKE 2 PUFFS BY MOUTH EVERY 6 HOURS AS NEEDED FOR WHEEZE OR SHORTNESS OF BREATH, Disp: 8.5 each, Rfl: 1   amLODipine (NORVASC) 10 MG tablet, TAKE 1 TABLET BY MOUTH AT BEDTIME, Disp: 90 tablet, Rfl:  2   aspirin EC 81 MG tablet, Take 81 mg by mouth daily., Disp: , Rfl:    Cholecalciferol (VITAMIN D3) 1000 units CAPS, Take 1 capsule by mouth daily., Disp: , Rfl:    doxycycline (VIBRAMYCIN) 100 MG capsule, Take 1 capsule (100 mg total) by mouth 2 (two) times daily., Disp: 20 capsule, Rfl: 0   fluticasone (FLONASE) 50 MCG/ACT nasal spray, Place 1 spray into both nostrils daily for 3 days., Disp: 16 g, Rfl: 0   Iron-FA-B Cmp-C-Biot-Probiotic (FUSION PLUS) CAPS, TAKE 1 CAPSULE BY MOUTH EVERY DAY, Disp: 30 capsule, Rfl: 1   levothyroxine (SYNTHROID) 125 MCG tablet, TAKE 1 TABLET BY MOUTH EVERY DAY BEFORE BREAKFAST, Disp: 90 tablet, Rfl: 1   Multiple Vitamin (MULTIVITAMIN WITH MINERALS) TABS tablet, Take 1 tablet by mouth daily., Disp: , Rfl:    pantoprazole (PROTONIX) 40 MG tablet, TAKE 1 TABLET BY MOUTH EVERY DAY, Disp: 90 tablet, Rfl: 2   sertraline (ZOLOFT) 25 MG tablet, TAKE 1 TABLET (25 MG TOTAL) BY MOUTH DAILY., Disp: 90 tablet, Rfl: 1   simvastatin (ZOCOR) 20 MG tablet, TAKE 1 TABLET BY MOUTH EVERY DAY IN THE EVENING, Disp: 90 tablet, Rfl: 1   valsartan (DIOVAN) 160 MG tablet, TAKE 1 TABLET BY MOUTH EVERY DAY (Patient taking differently: Take 160 mg by mouth daily before breakfast.), Disp: 90 tablet, Rfl: 1   hydrALAZINE (APRESOLINE) 25  MG tablet, Take 1 tablet (25 mg total) by mouth 3 (three) times daily. ONE TABLET BY MOUTH AT BREAKFAST LUNCH AND BEDTIME., Disp: 180 tablet, Rfl: 3   Allergies  Allergen Reactions   Lisinopril Cough     Review of Systems  Constitutional: Negative.   Eyes: Negative.  Negative for blurred vision.  Respiratory: Negative.  Negative for shortness of breath.   Cardiovascular: Negative.  Negative for chest pain and palpitations.  Gastrointestinal: Negative.   Neurological: Negative.  Negative for headaches.  Psychiatric/Behavioral: Negative.      Today's Vitals   07/04/21 0902 07/04/21 1106  BP: 140/60 (!) 148/88  Pulse: 69   Temp: 97.8 F (36.6 C)    Weight: 144 lb 6.4 oz (65.5 kg)   Height: 5' 7.2" (1.707 m)   PainSc: 0-No pain    Body mass index is 22.48 kg/m.  Wt Readings from Last 3 Encounters:  07/04/21 144 lb 6.4 oz (65.5 kg)  05/28/21 142 lb 9.6 oz (64.7 kg)  04/29/21 145 lb (65.8 kg)     Objective:  Physical Exam Vitals and nursing note reviewed.  Constitutional:      Appearance: Normal appearance.  HENT:     Head: Normocephalic and atraumatic.  Eyes:     Extraocular Movements: Extraocular movements intact.  Cardiovascular:     Rate and Rhythm: Normal rate and regular rhythm.     Heart sounds: Normal heart sounds.  Pulmonary:     Effort: Pulmonary effort is normal.     Breath sounds: Normal breath sounds.  Musculoskeletal:     Cervical back: Normal range of motion.  Skin:    General: Skin is warm.  Neurological:     General: No focal deficit present.     Mental Status: He is alert.  Psychiatric:        Mood and Affect: Mood normal.        Assessment And Plan:     1. Essential hypertension, benign Comments: Uncontrolled. I will increase valsartan to BID dosing. He will rto in 2 weeks for nurse visit, and he will see me in 6 wks. Reminded to follow low sodium diet.  - BMP8+eGFR  2. Acquired hypothyroidism Comments: I will check thyroid panel and adjust meds as needed.  - TSH - T4, free  3. BMI 22.0-22.9, adult Comments: He has gained 2 lbs since March 2023.      Patient was given opportunity to ask questions. Patient verbalized understanding of the plan and was able to repeat key elements of the plan. All questions were answered to their satisfaction.   I, [name], MD, have reviewed all documentation for this visit. The documentation on 07/04/21 for the exam, diagnosis, procedures, and orders are all accurate and complete.   IF YOU HAVE BEEN REFERRED TO A SPECIALIST, IT MAY TAKE 1-2 WEEKS TO SCHEDULE/PROCESS THE REFERRAL. IF YOU HAVE NOT HEARD FROM US/SPECIALIST IN TWO WEEKS, PLEASE GIVE  [place] A CALL AT [phone] X [phone].   THE PATIENT IS ENCOURAGED TO PRACTICE SOCIAL DISTANCING DUE TO THE COVID-19 PANDEMIC.

## 2021-07-04 NOTE — Patient Instructions (Addendum)
Increase valsartan '160mg'$  to twice daily dosing ?Breakfast and dinner ? ?Hypertension, Adult ?Hypertension is another name for high blood pressure. High blood pressure forces your heart to work harder to pump blood. This can cause problems over time. ?There are two numbers in a blood pressure reading. There is a top number (systolic) over a bottom number (diastolic). It is best to have a blood pressure that is below 120/80. ?What are the causes? ?The cause of this condition is not known. Some other conditions can lead to high blood pressure. ?What increases the risk? ?Some lifestyle factors can make you more likely to develop high blood pressure: ?Smoking. ?Not getting enough exercise or physical activity. ?Being overweight. ?Having too much fat, sugar, calories, or salt (sodium) in your diet. ?Drinking too much alcohol. ?Other risk factors include: ?Having any of these conditions: ?Heart disease. ?Diabetes. ?High cholesterol. ?Kidney disease. ?Obstructive sleep apnea. ?Having a family history of high blood pressure and high cholesterol. ?Age. The risk increases with age. ?Stress. ?What are the signs or symptoms? ?High blood pressure may not cause symptoms. Very high blood pressure (hypertensive crisis) may cause: ?Headache. ?Fast or uneven heartbeats (palpitations). ?Shortness of breath. ?Nosebleed. ?Vomiting or feeling like you may vomit (nauseous). ?Changes in how you see. ?Very bad chest pain. ?Feeling dizzy. ?Seizures. ?How is this treated? ?This condition is treated by making healthy lifestyle changes, such as: ?Eating healthy foods. ?Exercising more. ?Drinking less alcohol. ?Your doctor may prescribe medicine if lifestyle changes do not help enough and if: ?Your top number is above 130. ?Your bottom number is above 80. ?Your personal target blood pressure may vary. ?Follow these instructions at home: ?Eating and drinking ? ?If told, follow the DASH eating plan. To follow this plan: ?Fill one half of your plate  at each meal with fruits and vegetables. ?Fill one fourth of your plate at each meal with whole grains. Whole grains include whole-wheat pasta, brown rice, and whole-grain bread. ?Eat or drink low-fat dairy products, such as skim milk or low-fat yogurt. ?Fill one fourth of your plate at each meal with low-fat (lean) proteins. Low-fat proteins include fish, chicken without skin, eggs, beans, and tofu. ?Avoid fatty meat, cured and processed meat, or chicken with skin. ?Avoid pre-made or processed food. ?Limit the amount of salt in your diet to less than 1,500 mg each day. ?Do not drink alcohol if: ?Your doctor tells you not to drink. ?You are pregnant, may be pregnant, or are planning to become pregnant. ?If you drink alcohol: ?Limit how much you have to: ?0-1 drink a day for women. ?0-2 drinks a day for men. ?Know how much alcohol is in your drink. In the [place] one drink equals one 12 oz bottle of beer (355 mL), one 5 oz glass of wine (148 mL), or one 1? oz glass of hard liquor (44 mL). ?Lifestyle ? ?Work with your doctor to stay at a healthy weight or to lose weight. Ask your doctor what the best weight is for you. ?Get at least 30 minutes of exercise that causes your heart to beat faster (aerobic exercise) most days of the week. This may include walking, swimming, or biking. ?Get at least 30 minutes of exercise that strengthens your muscles (resistance exercise) at least 3 days a week. This may include lifting weights or doing Pilates. ?Do not smoke or use any products that contain nicotine or tobacco. If you need help quitting, ask your doctor. ?Check your blood pressure at home as told by your  doctor. ?Keep all follow-up visits. ?Medicines ?Take over-the-counter and prescription medicines only as told by your doctor. Follow directions carefully. ?Do not skip doses of blood pressure medicine. The medicine does not work as well if you skip doses. Skipping doses also puts you at risk for problems. ?Ask your doctor  about side effects or reactions to medicines that you should watch for. ?Contact a doctor if: ?You think you are having a reaction to the medicine you are taking. ?You have headaches that keep coming back. ?You feel dizzy. ?You have swelling in your ankles. ?You have trouble with your vision. ?Get help right away if: ?You get a very bad headache. ?You start to feel mixed up (confused). ?You feel weak or numb. ?You feel faint. ?You have very bad pain in your: ?Chest. ?Belly (abdomen). ?You vomit more than once. ?You have trouble breathing. ?These symptoms may be an emergency. Get help right away. Call 911. ?Do not wait to see if the symptoms will go away. ?Do not drive yourself to the hospital. ?Summary ?Hypertension is another name for high blood pressure. ?High blood pressure forces your heart to work harder to pump blood. ?For most people, a normal blood pressure is less than 120/80. ?Making healthy choices can help lower blood pressure. If your blood pressure does not get lower with healthy choices, you may need to take medicine. ?This information is not intended to replace advice given to you by your health care provider. Make sure you discuss any questions you have with your health care provider. ?Document Revised: 12/06/2020 Document Reviewed: 12/06/2020 ?Elsevier Patient Education ? [place]. ? ?

## 2021-07-05 LAB — BMP8+EGFR
BUN/Creatinine Ratio: 9 — ABNORMAL LOW (ref 10–24)
BUN: 9 mg/dL (ref 8–27)
CO2: 20 mmol/L (ref 20–29)
Calcium: 9.6 mg/dL (ref 8.6–10.2)
Chloride: 103 mmol/L (ref 96–106)
Creatinine, Ser: 1 mg/dL (ref 0.76–1.27)
Glucose: 91 mg/dL (ref 70–99)
Potassium: 4.1 mmol/L (ref 3.5–5.2)
Sodium: 140 mmol/L (ref 134–144)
eGFR: 78 mL/min/{1.73_m2} (ref >59)

## 2021-07-05 LAB — TSH: TSH: 0.117 u[IU]/mL — ABNORMAL LOW (ref 0.450–4.500)

## 2021-07-05 LAB — T4, FREE: Free T4: 1.86 ng/dL — ABNORMAL HIGH (ref 0.82–1.77)

## 2021-07-08 ENCOUNTER — Other Ambulatory Visit: Payer: Self-pay

## 2021-07-08 DIAGNOSIS — E039 Hypothyroidism, unspecified: Secondary | ICD-10-CM

## 2021-07-18 ENCOUNTER — Other Ambulatory Visit: Payer: Self-pay | Admitting: Hematology

## 2021-07-18 ENCOUNTER — Other Ambulatory Visit: Payer: Self-pay | Admitting: Internal Medicine

## 2021-07-23 ENCOUNTER — Ambulatory Visit: Payer: Medicare Other | Admitting: Internal Medicine

## 2021-07-23 ENCOUNTER — Ambulatory Visit: Payer: Medicare Other

## 2021-07-23 VITALS — BP 130/72 | HR 60 | Temp 98.1°F

## 2021-07-23 DIAGNOSIS — I10 Essential (primary) hypertension: Secondary | ICD-10-CM

## 2021-07-23 NOTE — Progress Notes (Signed)
Patient presents today for BPC. He is currently on amlodipine '10mg'$  hydralazine 25 3 times a day and valsartan '160mg'$ .   BP Readings from Last 3 Encounters:  07/23/21 130/72  07/04/21 (!) 148/88  05/28/21 (!) 144/60   Patient will continue with current medication and follow up with provider in 3 weeks.

## 2021-08-04 ENCOUNTER — Other Ambulatory Visit: Payer: Self-pay | Admitting: Internal Medicine

## 2021-08-04 DIAGNOSIS — I10 Essential (primary) hypertension: Secondary | ICD-10-CM

## 2021-08-13 ENCOUNTER — Other Ambulatory Visit: Payer: Self-pay

## 2021-08-13 MED ORDER — LEVOTHYROXINE SODIUM 112 MCG PO TABS: 112.0000 ug | ORAL_TABLET | Freq: Every day | ORAL | 11 refills | Status: DC

## 2021-08-15 ENCOUNTER — Ambulatory Visit (INDEPENDENT_AMBULATORY_CARE_PROVIDER_SITE_OTHER): Payer: Medicare Other | Admitting: Internal Medicine

## 2021-08-15 ENCOUNTER — Encounter: Payer: Self-pay | Admitting: Internal Medicine

## 2021-08-15 VITALS — BP 134/70 | HR 84 | Temp 98.3°F | Ht 66.6 in | Wt 144.4 lb

## 2021-08-15 DIAGNOSIS — I10 Essential (primary) hypertension: Secondary | ICD-10-CM | POA: Diagnosis not present

## 2021-08-15 DIAGNOSIS — Z6822 Body mass index (BMI) 22.0-22.9, adult: Secondary | ICD-10-CM | POA: Diagnosis not present

## 2021-08-15 DIAGNOSIS — E039 Hypothyroidism, unspecified: Secondary | ICD-10-CM

## 2021-08-15 NOTE — Patient Instructions (Addendum)
FLEXOGENIX FOR KNEE PAIN  Hypertension, Adult Hypertension is another name for high blood pressure. High blood pressure forces your heart to work harder to pump blood. This can cause problems over time. There are two numbers in a blood pressure reading. There is a top number (systolic) over a bottom number (diastolic). It is best to have a blood pressure that is below 120/80. What are the causes? The cause of this condition is not known. Some other conditions can lead to high blood pressure. What increases the risk? Some lifestyle factors can make you more likely to develop high blood pressure: Smoking. Not getting enough exercise or physical activity. Being overweight. Having too much fat, sugar, calories, or salt (sodium) in your diet. Drinking too much alcohol. Other risk factors include: Having any of these conditions: Heart disease. Diabetes. High cholesterol. Kidney disease. Obstructive sleep apnea. Having a family history of high blood pressure and high cholesterol. Age. The risk increases with age. Stress. What are the signs or symptoms? High blood pressure may not cause symptoms. Very high blood pressure (hypertensive crisis) may cause: Headache. Fast or uneven heartbeats (palpitations). Shortness of breath. Nosebleed. Vomiting or feeling like you may vomit (nauseous). Changes in how you see. Very bad chest pain. Feeling dizzy. Seizures. How is this treated? This condition is treated by making healthy lifestyle changes, such as: Eating healthy foods. Exercising more. Drinking less alcohol. Your doctor may prescribe medicine if lifestyle changes do not help enough and if: Your top number is above 130. Your bottom number is above 80. Your personal target blood pressure may vary. Follow these instructions at home: Eating and drinking  If told, follow the DASH eating plan. To follow this plan: Fill one half of your plate at each meal with fruits and  vegetables. Fill one fourth of your plate at each meal with whole grains. Whole grains include whole-wheat pasta, brown rice, and whole-grain bread. Eat or drink low-fat dairy products, such as skim milk or low-fat yogurt. Fill one fourth of your plate at each meal with low-fat (lean) proteins. Low-fat proteins include fish, chicken without skin, eggs, beans, and tofu. Avoid fatty meat, cured and processed meat, or chicken with skin. Avoid pre-made or processed food. Limit the amount of salt in your diet to less than 1,500 mg each day. Do not drink alcohol if: Your doctor tells you not to drink. You are pregnant, may be pregnant, or are planning to become pregnant. If you drink alcohol: Limit how much you have to: 0-1 drink a day for women. 0-2 drinks a day for men. Know how much alcohol is in your drink. In the [place] one drink equals one 12 oz bottle of beer (355 mL), one 5 oz glass of wine (148 mL), or one 1 oz glass of hard liquor (44 mL). Lifestyle  Work with your doctor to stay at a healthy weight or to lose weight. Ask your doctor what the best weight is for you. Get at least 30 minutes of exercise that causes your heart to beat faster (aerobic exercise) most days of the week. This may include walking, swimming, or biking. Get at least 30 minutes of exercise that strengthens your muscles (resistance exercise) at least 3 days a week. This may include lifting weights or doing Pilates. Do not smoke or use any products that contain nicotine or tobacco. If you need help quitting, ask your doctor. Check your blood pressure at home as told by your doctor. Keep all follow-up visits. Medicines  Take over-the-counter and prescription medicines only as told by your doctor. Follow directions carefully. Do not skip doses of blood pressure medicine. The medicine does not work as well if you skip doses. Skipping doses also puts you at risk for problems. Ask your doctor about side effects or  reactions to medicines that you should watch for. Contact a doctor if: You think you are having a reaction to the medicine you are taking. You have headaches that keep coming back. You feel dizzy. You have swelling in your ankles. You have trouble with your vision. Get help right away if: You get a very bad headache. You start to feel mixed up (confused). You feel weak or numb. You feel faint. You have very bad pain in your: Chest. Belly (abdomen). You vomit more than once. You have trouble breathing. These symptoms may be an emergency. Get help right away. Call 911. Do not wait to see if the symptoms will go away. Do not drive yourself to the hospital. Summary Hypertension is another name for high blood pressure. High blood pressure forces your heart to work harder to pump blood. For most people, a normal blood pressure is less than 120/80. Making healthy choices can help lower blood pressure. If your blood pressure does not get lower with healthy choices, you may need to take medicine. This information is not intended to replace advice given to you by your health care provider. Make sure you discuss any questions you have with your health care provider. Document Revised: 12/06/2020 Document Reviewed: 12/06/2020 Elsevier Patient Education  [place].

## 2021-08-15 NOTE — Progress Notes (Signed)
[name],acting as a Education administrator for [name], MD.,have documented all relevant documentation on the behalf of [name], MD,as directed by  [name], MD while in the presence of [name], MD.  This visit occurred during the SARS-CoV-2 public health emergency.  Safety protocols were in place, including screening questions prior to the visit, additional usage of staff PPE, and extensive cleaning of exam room while observing appropriate contact time as indicated for disinfecting solutions.  Subjective:     Patient ID: Shawn Fernandez , male    DOB: Jul 07, 1945 , 76 y.o.   MRN: [id number]   Chief Complaint  Patient presents with  . Hypertension  . Hypothyroidism    HPI  He presents today for a bp and thyroid recheck. Patient is compliant with his meds.   Hypertension This is a chronic problem. The current episode started more than 1 year ago. The problem has been gradually improving since onset. The problem is controlled. Pertinent negatives include no blurred vision, chest pain, headaches, palpitations or shortness of breath. Risk factors for coronary artery disease include male gender and dyslipidemia.     Past Medical History:  Diagnosis Date  . Blood clot in abdominal vein    treated at [facility]  . Carotid artery disease ([place])   . Dyslipidemia   . FHx: heart disease   . History of blood transfusion 8-9 yrs ago  . HTN (hypertension)   . Peripheral vascular disease ([place])      Family History  Problem Relation Age of Onset  . Heart disease Brother   . Heart disease Mother   . Cancer Father        ? type     Current Outpatient Medications:  .  albuterol (VENTOLIN HFA) 108 (90 Base) MCG/ACT inhaler, TAKE 2 PUFFS BY MOUTH EVERY 6 HOURS AS NEEDED FOR WHEEZE OR SHORTNESS OF BREATH, Disp: 8.5 each, Rfl: 1 .  amLODipine (NORVASC) 10 MG tablet, TAKE 1 TABLET BY MOUTH AT BEDTIME, Disp: 90 tablet, Rfl: 2 .  aspirin EC 81 MG tablet, Take 81 mg by mouth  daily., Disp: , Rfl:  .  Cholecalciferol (VITAMIN D3) 1000 units CAPS, Take 1 capsule by mouth daily., Disp: , Rfl:  .  doxycycline (VIBRAMYCIN) 100 MG capsule, Take 1 capsule (100 mg total) by mouth 2 (two) times daily., Disp: 20 capsule, Rfl: 0 .  fluticasone (FLONASE) 50 MCG/ACT nasal spray, Place 1 spray into both nostrils daily for 3 days., Disp: 16 g, Rfl: 0 .  hydrALAZINE (APRESOLINE) 25 MG tablet, Take 1 tablet (25 mg total) by mouth 3 (three) times daily. ONE TABLET BY MOUTH AT BREAKFAST LUNCH AND BEDTIME., Disp: 180 tablet, Rfl: 3 .  Iron-FA-B Cmp-C-Biot-Probiotic (FUSION PLUS) CAPS, TAKE 1 CAPSULE BY MOUTH EVERY DAY, Disp: 30 capsule, Rfl: 1 .  levothyroxine (SYNTHROID) 112 MCG tablet, Take 1 tablet (112 mcg total) by mouth daily., Disp: 30 tablet, Rfl: 11 .  Multiple Vitamin (MULTIVITAMIN WITH MINERALS) TABS tablet, Take 1 tablet by mouth daily., Disp: , Rfl:  .  pantoprazole (PROTONIX) 40 MG tablet, TAKE 1 TABLET BY MOUTH EVERY DAY, Disp: 90 tablet, Rfl: 2 .  sertraline (ZOLOFT) 25 MG tablet, TAKE 1 TABLET (25 MG TOTAL) BY MOUTH DAILY., Disp: 90 tablet, Rfl: 1 .  simvastatin (ZOCOR) 20 MG tablet, TAKE 1 TABLET BY MOUTH EVERY DAY IN THE EVENING, Disp: 90 tablet, Rfl: 1 .  valsartan (DIOVAN) 160 MG tablet, Take 1 tablet (160  mg total) by mouth daily before breakfast., Disp: 90 tablet, Rfl: 1   Allergies  Allergen Reactions  . Lisinopril Cough     Review of Systems  Constitutional: Negative.   Eyes:  Negative for blurred vision.  Respiratory: Negative.  Negative for shortness of breath.   Cardiovascular: Negative.  Negative for chest pain and palpitations.  Gastrointestinal: Negative.   Neurological: Negative.  Negative for headaches.  Psychiatric/Behavioral: Negative.       Today's Vitals   08/15/21 1617  BP: 134/70  Pulse: 84  Temp: 98.3 F (36.8 C)  Weight: 144 lb 6.4 oz (65.5 kg)  Height: 5' 6.6" (1.692 m)  PainSc: 0-No pain   Body mass index is 22.89 kg/m.  Wt  Readings from Last 3 Encounters:  08/15/21 144 lb 6.4 oz (65.5 kg)  07/04/21 144 lb 6.4 oz (65.5 kg)  05/28/21 142 lb 9.6 oz (64.7 kg)    BP Readings from Last 3 Encounters:  08/15/21 134/70  07/23/21 130/72  07/04/21 (!) 148/88      Objective:  Physical Exam Vitals and nursing note reviewed.  Constitutional:      Appearance: Normal appearance.  Cardiovascular:     Rate and Rhythm: Normal rate and regular rhythm.     Heart sounds: Normal heart sounds.  Pulmonary:     Effort: Pulmonary effort is normal.     Breath sounds: Normal breath sounds.  Musculoskeletal:     Cervical back: Normal range of motion.  Skin:    General: Skin is warm.  Neurological:     General: No focal deficit present.     Mental Status: He is alert.  Psychiatric:        Mood and Affect: Mood normal.      Assessment And Plan:     1. Essential hypertension, benign  2. Acquired hypothyroidism  3. BMI 22.0-22.9, adult     Patient was given opportunity to ask questions. Patient verbalized understanding of the plan and was able to repeat key elements of the plan. All questions were answered to their satisfaction.   I, [name], MD, have reviewed all documentation for this visit. The documentation on 08/15/21 for the exam, diagnosis, procedures, and orders are all accurate and complete.   IF YOU HAVE BEEN REFERRED TO A SPECIALIST, IT MAY TAKE 1-2 WEEKS TO SCHEDULE/PROCESS THE REFERRAL. IF YOU HAVE NOT HEARD FROM US/SPECIALIST IN TWO WEEKS, PLEASE GIVE [place] A CALL AT [phone] X [phone].   THE PATIENT IS ENCOURAGED TO PRACTICE SOCIAL DISTANCING DUE TO THE COVID-19 PANDEMIC.

## 2021-08-17 DIAGNOSIS — Z6822 Body mass index (BMI) 22.0-22.9, adult: Secondary | ICD-10-CM | POA: Insufficient documentation

## 2021-08-20 ENCOUNTER — Other Ambulatory Visit: Payer: Medicare Other

## 2021-08-20 LAB — TSH: TSH: 0.113 u[IU]/mL — ABNORMAL LOW (ref 0.450–4.500)

## 2021-08-22 ENCOUNTER — Other Ambulatory Visit: Payer: Self-pay

## 2021-08-22 MED ORDER — LEVOTHYROXINE SODIUM 100 MCG PO TABS: ORAL_TABLET | ORAL | 1 refills | Status: DC

## 2021-09-09 ENCOUNTER — Telehealth: Payer: Self-pay | Admitting: Hematology

## 2021-09-09 NOTE — Telephone Encounter (Signed)
Left message with rescheduled upcoming appointment due to provider's schedule. 

## 2021-09-10 ENCOUNTER — Ambulatory Visit (INDEPENDENT_AMBULATORY_CARE_PROVIDER_SITE_OTHER): Payer: Medicare Other

## 2021-09-10 VITALS — BP 138/90 | HR 73 | Temp 98.1°F | Ht 66.0 in | Wt 145.0 lb

## 2021-09-10 DIAGNOSIS — Z23 Encounter for immunization: Secondary | ICD-10-CM

## 2021-09-10 NOTE — Progress Notes (Signed)
Pt presents today for first shingrix.

## 2021-09-13 ENCOUNTER — Other Ambulatory Visit: Payer: Self-pay | Admitting: Internal Medicine

## 2021-10-04 ENCOUNTER — Ambulatory Visit (HOSPITAL_COMMUNITY)
Admission: RE | Admit: 2021-10-04 | Discharge: 2021-10-04 | Disposition: A | Discharge status: Home / Self Care | Payer: Medicare Other | Source: Ambulatory Visit | Attending: Cardiology | Admitting: Cardiology

## 2021-10-04 DIAGNOSIS — I6523 Occlusion and stenosis of bilateral carotid arteries: Secondary | ICD-10-CM

## 2021-10-10 ENCOUNTER — Other Ambulatory Visit: Payer: Self-pay | Admitting: Hematology

## 2021-10-10 ENCOUNTER — Other Ambulatory Visit: Payer: Self-pay | Admitting: Internal Medicine

## 2021-10-17 ENCOUNTER — Other Ambulatory Visit: Payer: Medicare Other

## 2021-10-29 ENCOUNTER — Other Ambulatory Visit: Payer: Self-pay | Admitting: *Deleted

## 2021-10-29 DIAGNOSIS — D500 Iron deficiency anemia secondary to blood loss (chronic): Secondary | ICD-10-CM

## 2021-10-30 ENCOUNTER — Other Ambulatory Visit: Payer: Self-pay

## 2021-10-30 ENCOUNTER — Other Ambulatory Visit: Payer: Medicare Other

## 2021-10-30 ENCOUNTER — Inpatient Hospital Stay (HOSPITAL_BASED_OUTPATIENT_CLINIC_OR_DEPARTMENT_OTHER): Payer: Medicare Other | Admitting: Hematology

## 2021-10-30 ENCOUNTER — Ambulatory Visit: Payer: Medicare Other | Admitting: Hematology

## 2021-10-30 ENCOUNTER — Inpatient Hospital Stay: Payer: Medicare Other | Attending: Hematology

## 2021-10-30 VITALS — BP 153/67 | HR 56 | Temp 97.9°F | Resp 18 | Ht 66.0 in | Wt 145.6 lb

## 2021-10-30 DIAGNOSIS — I714 Abdominal aortic aneurysm, without rupture, unspecified: Secondary | ICD-10-CM | POA: Insufficient documentation

## 2021-10-30 DIAGNOSIS — E0590 Thyrotoxicosis, unspecified without thyrotoxic crisis or storm: Secondary | ICD-10-CM | POA: Diagnosis not present

## 2021-10-30 DIAGNOSIS — D509 Iron deficiency anemia, unspecified: Secondary | ICD-10-CM | POA: Insufficient documentation

## 2021-10-30 DIAGNOSIS — I10 Essential (primary) hypertension: Secondary | ICD-10-CM | POA: Insufficient documentation

## 2021-10-30 DIAGNOSIS — D500 Iron deficiency anemia secondary to blood loss (chronic): Secondary | ICD-10-CM

## 2021-10-30 DIAGNOSIS — Z87891 Personal history of nicotine dependence: Secondary | ICD-10-CM | POA: Insufficient documentation

## 2021-10-30 DIAGNOSIS — Z809 Family history of malignant neoplasm, unspecified: Secondary | ICD-10-CM | POA: Insufficient documentation

## 2021-10-30 LAB — CMP (CANCER CENTER ONLY)
ALT: 12 U/L (ref 0–44)
AST: 30 U/L (ref 15–41)
Albumin: 4.3 g/dL (ref 3.5–5.0)
Alkaline Phosphatase: 98 U/L (ref 38–126)
Anion gap: 8 (ref 5–15)
BUN: 12 mg/dL (ref 8–23)
CO2: 25 mmol/L (ref 22–32)
Calcium: 9.8 mg/dL (ref 8.9–10.3)
Chloride: 111 mmol/L (ref 98–111)
Creatinine: 1.07 mg/dL (ref 0.61–1.24)
GFR, Estimated: >60 mL/min (ref >60)
Glucose, Bld: 102 mg/dL — ABNORMAL HIGH (ref 70–99)
Potassium: 3.9 mmol/L (ref 3.5–5.1)
Sodium: 144 mmol/L (ref 135–145)
Total Bilirubin: 0.5 mg/dL (ref 0.3–1.2)
Total Protein: 7.9 g/dL (ref 6.5–8.1)

## 2021-10-30 LAB — CBC WITH DIFFERENTIAL (CANCER CENTER ONLY)
Abs Immature Granulocytes: 0.01 10*3/uL (ref 0.00–0.07)
Basophils Absolute: 0.1 10*3/uL (ref 0.0–0.1)
Basophils Relative: 2 %
Eosinophils Absolute: 0.2 10*3/uL (ref 0.0–0.5)
Eosinophils Relative: 4 %
HCT: 38.9 % — ABNORMAL LOW (ref 39.0–52.0)
Hemoglobin: 12.8 g/dL — ABNORMAL LOW (ref 13.0–17.0)
Immature Granulocytes: 0 %
Lymphocytes Relative: 31 %
Lymphs Abs: 1.7 10*3/uL (ref 0.7–4.0)
MCH: 29.8 pg (ref 26.0–34.0)
MCHC: 32.9 g/dL (ref 30.0–36.0)
MCV: 90.5 fL (ref 80.0–100.0)
Monocytes Absolute: 0.5 10*3/uL (ref 0.1–1.0)
Monocytes Relative: 9 %
Neutro Abs: 3 10*3/uL (ref 1.7–7.7)
Neutrophils Relative %: 54 %
Platelet Count: 195 10*3/uL (ref 150–400)
RBC: 4.3 MIL/uL (ref 4.22–5.81)
RDW: 13.2 % (ref 11.5–15.5)
WBC Count: 5.5 10*3/uL (ref 4.0–10.5)
nRBC: 0 % (ref 0.0–0.2)

## 2021-10-30 LAB — SAMPLE TO BLOOD BANK

## 2021-10-30 LAB — IRON AND IRON BINDING CAPACITY (CC-WL,HP ONLY)
Iron: 129 ug/dL (ref 45–182)
Saturation Ratios: 34 % (ref 17.9–39.5)
TIBC: 379 ug/dL (ref 250–450)
UIBC: 250 ug/dL

## 2021-10-30 LAB — FERRITIN: Ferritin: 16 ng/mL — ABNORMAL LOW (ref 24–336)

## 2021-11-04 NOTE — Progress Notes (Signed)
HEMATOLOGY/ONCOLOGY CLINIC NOTE  Date of Service: 10/30/2021 Follow-up iron deficiency anemia  Patient Care Team: [name], MD as PCP - General (Internal Medicine) Gwenlyn Found Pearletha Forge, MD as PCP - Cardiology (Cardiology) Lorretta Harp, MD as Consulting Physician (Cardiology)   Endocrinologist: Dr. Jacelyn Pi  CHIEF COMPLAINTS/PURPOSE OF CONSULTATION:  F/u for IDA  DIAGNOSIS  Microcytic hypochromic Anemia with MCV 79.4 due to Iron deficiency. Ferritin 27, Iron sat too low to calculate. No overt evidence of bleeding. Patient notes recent fecal occult blood neg x 1. He has had significant iron deficiency anemia in the past in 2011 with a ferritin level of 9 and required a blood transfusion. EGD showed duodenitis and hiatal hernia. Patient remains at high risk of GI bleeding since he is on ASA+ plavix for his endovascular stent graft for repaired AAA and is on Cilostazole for PAD. Some element of anemia also appears to be temporally related to methimazole use (though agranulocytosis is the usual concern and isolated anemia is less usual) The hyperthyroidism itself can also contribute to the anemia until corrected. SPEP with no M spike No evidence of hemolysis B12/folate WNL  Current treatment -p.o. iron infusion plus -previously IV feraheme. Continue prn IV feraheme.  HISTORY OF PRESENTING ILLNESS: plz see initial consultation for details on initial presentation   INTERVAL HISTORY: Shawn Fernandez is a 76 y.o. male of his is here for continued evaluation and management iron deficiency anemia. He reports He is doing well with no new symptoms or concerns.  No new fatigue, lightheadedness, or dizziness. No overt GI bleeding. No black or bloody stools or other bleeding issues. No other new or acute focal symptoms.  He notes he is no longer on Plavix and only taking aspirin at this time.  Labs done today reviewed with patient in detail.  MEDICAL HISTORY:    Past Medical History:  Diagnosis Date   Blood clot in abdominal vein    treated at [facility]   Carotid artery disease ([place])    Dyslipidemia    FHx: heart disease    History of blood transfusion 8-9 yrs ago   HTN (hypertension)    Peripheral vascular disease ([place])    Hyperthyroidism -being followed by Dr. Chalmers Cater - was previously on methimazole for 6-8 months and has been off for a few weeks now. He was treated with radioactive iodine about one week ago.  He had presented with progressive weight loss from 2 years prior to diagnosis.  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  Previous history of iron deficiency anemia ferritin was 9 about 5 years ago in 2011  SURGICAL HISTORY: Past Surgical History:  Procedure Laterality Date   ANKLE SURGERY Right yrs ago   arm surgery Left yrs ago   2 rods inserted, 1 rod later removed   COLONOSCOPY WITH PROPOFOL N/A 09/16/2013   Procedure: COLONOSCOPY WITH PROPOFOL;  Surgeon: Beryle Beams, MD;  Location: [facility] ENDOSCOPY;  Service: Endoscopy;  Laterality: N/A;   LOWER EXTREMITY ANGIOGRAM N/A 07/24/2011   Procedure: LOWER EXTREMITY ANGIOGRAM;  Surgeon: Lorretta Harp, MD;  Location: [facility] CATH LAB;  Service: Cardiovascular;  Laterality: N/A;   stent in abdominal vein  4-5 yrs ago   US ECHOCARDIOGRAPHY  07-20-2007   EF 55-60%    SOCIAL HISTORY: Social History   Socioeconomic History   Marital status: Married    Spouse name: Not on file   Number of children: Not on  file   Years of education: Not on file   Highest education level: Not on file  Occupational History   Occupation: retired  Tobacco Use   Smoking status: Former    Packs/day: 0.75    Years: 20.00    Total pack years: 15.00    Types: Cigarettes    Quit date: 07/01/2009    Years since quitting: 12.3   Smokeless tobacco: Never  Vaping Use   Vaping Use: Never used  Substance and Sexual Activity   Alcohol use: Not Currently     Comment: only occ   Drug use: No   Sexual activity: Yes  Other Topics Concern   Not on file  Social History Narrative   Not on file   Social Determinants of Health   Financial Resource Strain: Low Risk  (04/25/2021)   Overall Financial Resource Strain (CARDIA)    Difficulty of Paying Living Expenses: Not hard at all  Food Insecurity: No Food Insecurity (04/25/2021)   Hunger Vital Sign    Worried About Running Out of Food in the Last Year: Never true    [place] in the Last Year: Never true  Transportation Needs: No Transportation Needs (04/25/2021)   PRAPARE - Hydrologist (Medical): No    Lack of Transportation (Non-Medical): No  Physical Activity: Inactive (04/25/2021)   Exercise Vital Sign    Days of Exercise per Week: 0 days    Minutes of Exercise per Session: 0 min  Stress: No Stress Concern Present (04/25/2021)   [place]    Feeling of Stress : Not at all  Social Connections: Not on file  Intimate Partner Violence: Not At Risk (12/16/2017)   Humiliation, Afraid, Rape, and Kick questionnaire    Fear of Current or Ex-Partner: No    Emotionally Abused: No    Physically Abused: No    Sexually Abused: No    FAMILY HISTORY: Family History  Problem Relation Age of Onset   Heart disease Brother    Heart disease Mother    Cancer Father        ? type    ALLERGIES:  is allergic to lisinopril.  MEDICATIONS:  Current Outpatient Medications  Medication Sig Dispense Refill   albuterol (VENTOLIN HFA) 108 (90 Base) MCG/ACT inhaler TAKE 2 PUFFS BY MOUTH EVERY 6 HOURS AS NEEDED FOR WHEEZE OR SHORTNESS OF BREATH 8.5 each 1   amLODipine (NORVASC) 10 MG tablet TAKE 1 TABLET BY MOUTH AT BEDTIME 90 tablet 2   aspirin EC 81 MG tablet Take 81 mg by mouth daily.     Cholecalciferol (VITAMIN D3) 1000 units CAPS Take 1 capsule by mouth daily.     fluticasone (FLONASE) 50 MCG/ACT nasal  spray Place 1 spray into both nostrils daily for 3 days. 16 g 0   hydrALAZINE (APRESOLINE) 25 MG tablet Take 1 tablet (25 mg total) by mouth 3 (three) times daily. ONE TABLET BY MOUTH AT BREAKFAST LUNCH AND BEDTIME. 180 tablet 3   iron polysaccharides (NIFEREX) 150 MG capsule TAKE 1 CAPSULE BY MOUTH EVERY DAY 90 capsule 1   Iron-FA-B Cmp-C-Biot-Probiotic (FUSION PLUS) CAPS TAKE 1 CAPSULE BY MOUTH EVERY DAY 30 capsule 1   levothyroxine (SYNTHROID) 100 MCG tablet TAKE 1 TABLET BY MOUTH EVERY DAY 30 tablet 1   Multiple Vitamin (MULTIVITAMIN WITH MINERALS) TABS tablet Take 1 tablet by mouth daily.     pantoprazole (PROTONIX) 40 MG tablet  TAKE 1 TABLET BY MOUTH EVERY DAY 90 tablet 2   sertraline (ZOLOFT) 25 MG tablet TAKE 1 TABLET (25 MG TOTAL) BY MOUTH DAILY. 90 tablet 1   simvastatin (ZOCOR) 20 MG tablet TAKE 1 TABLET BY MOUTH EVERY DAY IN THE EVENING 90 tablet 1   valsartan (DIOVAN) 160 MG tablet Take 1 tablet (160 mg total) by mouth daily before breakfast. 90 tablet 1   No current facility-administered medications for this visit.    REVIEW OF SYSTEMS:   10 Point review of Systems was done is negative except as noted above.  PHYSICAL EXAMINATION: ECOG FS:2 - Symptomatic, <50% confined to bed  Vitals:   10/30/21 1420  BP: (!) 153/67  Pulse: (!) 56  Resp: 18  Temp: 97.9 F (36.6 C)  SpO2: 96%    Wt Readings from Last 3 Encounters:  10/30/21 145 lb 9.6 oz (66 kg)  09/10/21 145 lb (65.8 kg)  08/15/21 144 lb 6.4 oz (65.5 kg)   Body mass index is 23.5 kg/m.   NAD GENERAL:alert, in no acute distress and comfortable SKIN: no acute rashes, no significant lesions EYES: conjunctiva are pink and non-injected, sclera anicteric NECK: supple, no JVD LYMPH:  no palpable lymphadenopathy in the cervical, axillary or inguinal regions LUNGS: clear to auscultation b/l with normal respiratory effort HEART: regular rate & rhythm ABDOMEN:  normoactive bowel sounds , non tender, not  distended. Extremity: no pedal edema PSYCH: alert & oriented x 3 with fluent speech NEURO: no focal motor/sensory deficits  LABORATORY DATA:  I have reviewed the data as listed  .    Latest Ref Rng & Units 10/30/2021    1:59 PM 04/29/2021    1:37 PM 01/17/2021    3:27 PM  CBC  WBC 4.0 - 10.5 K/uL 5.5  8.4  6.6   Hemoglobin 13.0 - 17.0 g/dL 12.8  13.3  12.9   Hematocrit 39.0 - 52.0 % 38.9  41.2  41.2   Platelets 150 - 400 K/uL 195  201  193    . CBC    Component Value Date/Time   WBC 5.5 10/30/2021 1359   WBC 3.6 (L) 09/06/2020 1005   RBC 4.30 10/30/2021 1359   HGB 12.8 (L) 10/30/2021 1359   HGB 12.9 (L) 01/17/2021 1527   HGB 13.9 03/21/2016 0919   HCT 38.9 (L) 10/30/2021 1359   HCT 41.2 01/17/2021 1527   HCT 43.1 03/21/2016 0919   PLT 195 10/30/2021 1359   PLT 193 01/17/2021 1527   MCV 90.5 10/30/2021 1359   MCV 86 01/17/2021 1527   MCV 89.2 03/21/2016 0919   MCH 29.8 10/30/2021 1359   MCHC 32.9 10/30/2021 1359   RDW 13.2 10/30/2021 1359   RDW 12.2 01/17/2021 1527   RDW 13.1 03/21/2016 0919   LYMPHSABS 1.7 10/30/2021 1359   LYMPHSABS 2.1 11/03/2018 1626   LYMPHSABS 2.5 03/21/2016 0919   MONOABS 0.5 10/30/2021 1359   MONOABS 0.8 03/21/2016 0919   EOSABS 0.2 10/30/2021 1359   EOSABS 0.2 11/03/2018 1626   BASOSABS 0.1 10/30/2021 1359   BASOSABS 0.1 11/03/2018 1626   BASOSABS 0.1 03/21/2016 0919       Latest Ref Rng & Units 10/30/2021    1:59 PM 07/04/2021    9:43 AM 04/29/2021    1:37 PM  CMP  Glucose 70 - 99 mg/dL 102  91  93   BUN 8 - 23 mg/dL '12  9  13   '$ Creatinine 0.61 - 1.24 mg/dL 1.07  1.00  0.99   Sodium 135 - 145 mmol/L 144  140  141   Potassium 3.5 - 5.1 mmol/L 3.9  4.1  4.0   Chloride 98 - 111 mmol/L 111  103  105   CO2 22 - 32 mmol/L '25  20  30   '$ Calcium 8.9 - 10.3 mg/dL 9.8  9.6  9.7   Total Protein 6.5 - 8.1 g/dL 7.9   7.7   Total Bilirubin 0.3 - 1.2 mg/dL 0.5   0.5   Alkaline Phos 38 - 126 U/L 98   111   AST 15 - 41 U/L 30   23   ALT 0 -  44 U/L 12   8    . Lab Results  Component Value Date   IRON 129 10/30/2021   TIBC 379 10/30/2021   IRONPCTSAT 34 10/30/2021   (Iron and TIBC)  Lab Results  Component Value Date   FERRITIN 16 (L) 10/30/2021   RADIOGRAPHIC STUDIES: I have personally reviewed the radiological images as listed and agreed with the findings in the report. No results found.   ASSESSMENT & PLAN:   76 yo AAM with   1) Microcytic hypochromic Anemia due to Iron deficiency - now resolved.  Previously had Ferritin 27, Iron sat too low to calculate. No overt evidence of bleeding. Patient notes recent fecal occult blood neg x 1. He has had significant iron deficiency anemia in the past in 2011 with a ferritin level of 9 and required a blood transfusion. EGD showed duodenitis and hiatal hernia. Patient was previously on ASA+ plavix for his endovascular stent graft for repaired AAA and is on Cilostazole for PAD.  He is currently only on aspirin. Some element of anemia also appears to be temporally related to methimazole use (though agranulocytosis is the usual concern and isolated anemia is less usual) The hyperthyroidism itself can also contribute to the anemia until corrected. SPEP with no M spike No evidence of hemolysis B12/folate WNL  PLAN: -Patient's lab results from today discussed in detail. CBC shows normal hemoglobin of 12.8 with normal WBC count and platelets. CMP unremarkable Ferritin low at 16 with an iron saturation of 34%. Discussed that his previous ferritin goal is more than 100. -He notes he is only on aspirin now and is off Plavix and cilostazol and has had no additional GI bleeding.  At this time he prefers to continue his oral iron and hold off on IV iron infusion. -Would recommend he get labs with his primary care physician in 3 months including CBC and iron labs. -Continue follow-up with PCP and GI for monitoring of GI bleeding. Patient still on Zoloft which can also cause platelet  dysfunction and increased risk of bleeding. Continue iron polysaccharide '150mg'$  po daily  FOLLOW UP: RTC with PCP  The total time spent in the appointment was 20 minutes*.  All of the patient's questions were answered with apparent satisfaction. The patient knows to call the clinic with any problems, questions or concerns.   Sullivan Lone MD MS AAHIVMS [facility] Hematology/Oncology Physician [facility]  .*Total Encounter Time as defined by the Centers for Medicare and Medicaid Services includes, in addition to the face-to-face time of a patient visit (documented in the note above) non-face-to-face time: obtaining and reviewing outside history, ordering and reviewing medications, tests or procedures, care coordination (communications with other health care professionals or caregivers) and documentation in the medical record.  I, Melene Muller, am acting as scribe for  Dr. Sullivan Lone, MD.  .I have reviewed the above documentation for accuracy and completeness, and I agree with the above.Brunetta Genera MD

## 2021-11-05 ENCOUNTER — Encounter: Payer: Self-pay | Admitting: Hematology

## 2021-11-08 ENCOUNTER — Other Ambulatory Visit: Payer: Self-pay | Admitting: Internal Medicine

## 2021-11-20 ENCOUNTER — Ambulatory Visit (INDEPENDENT_AMBULATORY_CARE_PROVIDER_SITE_OTHER): Payer: Medicare Other

## 2021-11-20 VITALS — BP 136/58 | HR 90 | Temp 98.5°F | Ht 66.0 in | Wt 145.0 lb

## 2021-11-20 DIAGNOSIS — Z23 Encounter for immunization: Secondary | ICD-10-CM

## 2021-11-20 NOTE — Progress Notes (Signed)
Patient presents today for a flu shot.  

## 2021-12-05 ENCOUNTER — Other Ambulatory Visit: Payer: Self-pay | Admitting: Internal Medicine

## 2021-12-11 ENCOUNTER — Ambulatory Visit
Admission: EM | Admit: 2021-12-11 | Discharge: 2021-12-11 | Disposition: A | Discharge status: Home / Self Care | Payer: Medicare Other | Attending: Internal Medicine | Admitting: Internal Medicine

## 2021-12-11 ENCOUNTER — Encounter: Payer: Self-pay | Admitting: Emergency Medicine

## 2021-12-11 DIAGNOSIS — J069 Acute upper respiratory infection, unspecified: Secondary | ICD-10-CM | POA: Diagnosis present

## 2021-12-11 DIAGNOSIS — U071 COVID-19: Secondary | ICD-10-CM | POA: Insufficient documentation

## 2021-12-11 MED ORDER — BENZONATATE 100 MG PO CAPS: 100.0000 mg | ORAL_CAPSULE | Freq: Three times a day (TID) | ORAL | 0 refills | Status: DC | PRN

## 2021-12-11 MED ORDER — FLUTICASONE PROPIONATE 50 MCG/ACT NA SUSP: 1.0000 | Freq: Every day | NASAL | 0 refills | Status: AC

## 2021-12-11 NOTE — Discharge Instructions (Addendum)
It appears that you have a viral upper respiratory infection that should run its course and self resolve with symptomatic treatment.  I have prescribed 2 medications including a cough medication to alleviate symptoms.  Please follow-up if symptoms persist or worsen.

## 2021-12-11 NOTE — ED Provider Notes (Signed)
[name]    CSN: [id number] Arrival date & time: 12/11/21  1206      History   Chief Complaint Chief Complaint  Patient presents with   Cough    HPI Shawn Fernandez is a 76 y.o. male.   Patient presents with runny nose and cough that started about a week ago.  His wife has similar symptoms.  He denies any documented fevers at home but does state that he "felt feverish" and had a tactile fever.  Denies chest pain, shortness of breath, sore throat, ear pain, nausea, vomiting, diarrhea, abdominal pain.  Patient reports that symptoms are improving.  He is taking Coricidin over-the-counter with some improvement in symptoms.  Denies any formal diagnosis of asthma or COPD but patient does report that he is a smoker.  Patient is requesting COVID test.   Cough   Past Medical History:  Diagnosis Date   Blood clot in abdominal vein    treated at [facility]   Carotid artery disease ([place])    Dyslipidemia    FHx: heart disease    History of blood transfusion 8-9 yrs ago   HTN (hypertension)    Peripheral vascular disease ([place])     Patient Active Problem List   Diagnosis Date Noted   BMI 22.0-22.9, adult 08/17/2021   Acquired hypothyroidism 11/21/2017   Iron deficiency anemia due to chronic blood loss 03/29/2015   Anemia 03/15/2015   Hyperthyroidism 01/04/2015   Claudication ([place]) 06/29/2014   Essential hypertension 10/27/2012   Hyperlipidemia 10/27/2012   Peripheral arterial disease ([place]) 10/27/2012   Carotid artery disease ([place]) 10/27/2012   Cough 11/06/2011    Past Surgical History:  Procedure Laterality Date   ANKLE SURGERY Right yrs ago   arm surgery Left yrs ago   2 rods inserted, 1 rod later removed   COLONOSCOPY WITH PROPOFOL N/A 09/16/2013   Procedure: COLONOSCOPY WITH PROPOFOL;  Surgeon: Beryle Beams, MD;  Location: [facility] ENDOSCOPY;  Service: Endoscopy;  Laterality: N/A;   LOWER EXTREMITY ANGIOGRAM N/A 07/24/2011   Procedure: LOWER EXTREMITY  ANGIOGRAM;  Surgeon: Lorretta Harp, MD;  Location: [facility] CATH LAB;  Service: Cardiovascular;  Laterality: N/A;   stent in abdominal vein  4-5 yrs ago   US ECHOCARDIOGRAPHY  07-20-2007   EF 55-60%       Home Medications    Prior to Admission medications   Medication Sig Start Date End Date Taking? Authorizing Provider  albuterol (VENTOLIN HFA) 108 (90 Base) MCG/ACT inhaler TAKE 2 PUFFS BY MOUTH EVERY 6 HOURS AS NEEDED FOR WHEEZE OR SHORTNESS OF BREATH 07/24/20   Ghumman, Ramandeep, NP  amLODipine (NORVASC) 10 MG tablet TAKE 1 TABLET BY MOUTH AT BEDTIME 04/15/21   [name], MD  aspirin EC 81 MG tablet Take 81 mg by mouth daily.    [provider]  benzonatate (TESSALON) 100 MG capsule Take 1 capsule (100 mg total) by mouth every 8 (eight) hours as needed for cough. 12/11/21  Yes Dmiya Malphrus, Hildred Alamin E, FNP  Cholecalciferol (VITAMIN D3) 1000 units CAPS Take 1 capsule by mouth daily.    [provider]  fluticasone (FLONASE) 50 MCG/ACT nasal spray Place 1 spray into both nostrils daily for 3 days. 12/11/21 12/14/21 Yes Hayslee Casebolt, Michele Rockers, FNP  hydrALAZINE (APRESOLINE) 25 MG tablet Take 1 tablet (25 mg total) by mouth 3 (three) times daily. ONE TABLET BY MOUTH AT BREAKFAST LUNCH AND BEDTIME. 07/18/21   [name], MD  iron polysaccharides (NIFEREX) 150 MG capsule  TAKE 1 CAPSULE BY MOUTH EVERY DAY 10/11/21   Brunetta Genera, MD  Iron-FA-B Cmp-C-Biot-Probiotic (FUSION PLUS) CAPS TAKE 1 CAPSULE BY MOUTH EVERY DAY 10/24/20   [name], MD  levothyroxine (SYNTHROID) 100 MCG tablet TAKE 1 TABLET BY MOUTH EVERY DAY 12/05/21   [name], MD  Multiple Vitamin (MULTIVITAMIN WITH MINERALS) TABS tablet Take 1 tablet by mouth daily.    [provider]  pantoprazole (PROTONIX) 40 MG tablet TAKE 1 TABLET BY MOUTH EVERY DAY 08/05/21   [name], MD  sertraline (ZOLOFT) 25 MG tablet TAKE 1 TABLET (25 MG TOTAL) BY MOUTH DAILY. 10/11/21 10/11/22  [name], MD  simvastatin  (ZOCOR) 20 MG tablet TAKE 1 TABLET BY MOUTH EVERY DAY IN THE EVENING 10/11/21   [name], MD  valsartan (DIOVAN) 160 MG tablet Take 1 tablet (160 mg total) by mouth daily before breakfast. 08/05/21   [name], MD    Family History Family History  Problem Relation Age of Onset   Heart disease Brother    Heart disease Mother    Cancer Father        ? type    Social History Social History   Tobacco Use   Smoking status: Former    Packs/day: 0.75    Years: 20.00    Total pack years: 15.00    Types: Cigarettes    Quit date: 07/01/2009    Years since quitting: 12.4   Smokeless tobacco: Never  Vaping Use   Vaping Use: Never used  Substance Use Topics   Alcohol use: Not Currently    Comment: only occ   Drug use: No     Allergies   Lisinopril   Review of Systems Review of Systems Per HPI  Physical Exam Triage Vital Signs ED Triage Vitals [12/11/21 1229]  Enc Vitals Group     BP (!) 152/67     Pulse Rate (!) 59     Resp 16     Temp 97.8 F (36.6 C)     Temp src      SpO2 96 %     Weight      Height      Head Circumference      Peak Flow      Pain Score 0     Pain Loc      Pain Edu?      Excl. in [place]?    No data found.  Updated Vital Signs BP (!) 152/67   Pulse (!) 59   Temp 97.8 F (36.6 C)   Resp 16   SpO2 96%   Visual Acuity Right Eye Distance:   Left Eye Distance:   Bilateral Distance:    Right Eye Near:   Left Eye Near:    Bilateral Near:     Physical Exam Constitutional:      General: He is not in acute distress.    Appearance: Normal appearance. He is not toxic-appearing or diaphoretic.  HENT:     Head: Normocephalic and atraumatic.     Right Ear: Tympanic membrane and ear canal normal.     Left Ear: Tympanic membrane and ear canal normal.     Nose: Congestion present.     Mouth/Throat:     Mouth: Mucous membranes are moist.     Pharynx: No posterior oropharyngeal erythema.  Eyes:     Extraocular Movements: Extraocular  movements intact.     Conjunctiva/sclera: Conjunctivae normal.     Pupils: Pupils are equal,  round, and reactive to light.  Cardiovascular:     Rate and Rhythm: Normal rate and regular rhythm.     Pulses: Normal pulses.     Heart sounds: Normal heart sounds.  Pulmonary:     Effort: Pulmonary effort is normal. No respiratory distress.     Breath sounds: Normal breath sounds. No stridor. No wheezing, rhonchi or rales.  Abdominal:     General: Abdomen is flat. Bowel sounds are normal.     Palpations: Abdomen is soft.  Musculoskeletal:        General: Normal range of motion.     Cervical back: Normal range of motion.  Skin:    General: Skin is warm and dry.  Neurological:     General: No focal deficit present.     Mental Status: He is alert and oriented to person, place, and time. Mental status is at baseline.  Psychiatric:        Mood and Affect: Mood normal.        Behavior: Behavior normal.      UC Treatments / Results  Labs (all labs ordered are listed, but only abnormal results are displayed) Labs Reviewed  SARS CORONAVIRUS 2 (TAT 6-24 HRS)    EKG   Radiology No results found.  Procedures Procedures (including critical care time)  Medications Ordered in UC Medications - No data to display  Initial Impression / Assessment and Plan / UC Course  I have reviewed the triage vital signs and the nursing notes.  Pertinent labs & imaging results that were available during my care of the patient were reviewed by me and considered in my medical decision making (see chart for details).     Patient presents with symptoms likely from a viral upper respiratory infection. Differential includes bacterial pneumonia, sinusitis, allergic rhinitis, COVID-19, flu, RSV. Do not suspect underlying cardiopulmonary process. Symptoms seem unlikely related to ACS, CHF or COPD exacerbations, pneumonia, pneumothorax. Patient is nontoxic appearing and not in need of emergent medical  intervention.  Patient requesting COVID test and advised patient given duration of symptoms that it may not be accurate.  Patient would still like COVID test completed so this is pending.  Recommended symptom control with over the counter medications.  Patient sent prescriptions.  Return if symptoms fail to improve in 1-2 weeks or you develop shortness of breath, chest pain, severe headache. Patient states understanding and is agreeable.  Discharged with PCP followup.  Final Clinical Impressions(s) / UC Diagnoses   Final diagnoses:  Viral upper respiratory tract infection with cough     Discharge Instructions      It appears that you have a viral upper respiratory infection that should run its course and self resolve with symptomatic treatment.  I have prescribed 2 medications including a cough medication to alleviate symptoms.  Please follow-up if symptoms persist or worsen.    ED Prescriptions     Medication Sig Dispense Auth. Provider   benzonatate (TESSALON) 100 MG capsule Take 1 capsule (100 mg total) by mouth every 8 (eight) hours as needed for cough. 21 capsule [place], [place] E, [place]   fluticasone [facility]) 50 MCG/ACT nasal spray Place 1 spray into both nostrils daily for 3 days. 16 g Teodora Medici, [place]      PDMP not reviewed this encounter.   Teodora Medici, [place] 12/11/21 1255

## 2021-12-11 NOTE — ED Triage Notes (Signed)
Pt is present today with /o cough. Pt sx started one week ago

## 2021-12-12 LAB — SARS CORONAVIRUS 2 (TAT 6-24 HRS): SARS Coronavirus 2: POSITIVE — AB

## 2021-12-18 ENCOUNTER — Ambulatory Visit (INDEPENDENT_AMBULATORY_CARE_PROVIDER_SITE_OTHER): Payer: Medicare Other | Admitting: Internal Medicine

## 2021-12-18 ENCOUNTER — Encounter: Payer: Self-pay | Admitting: Internal Medicine

## 2021-12-18 VITALS — BP 120/72 | HR 66 | Temp 97.6°F | Ht 66.0 in | Wt 144.0 lb

## 2021-12-18 DIAGNOSIS — Z2821 Immunization not carried out because of patient refusal: Secondary | ICD-10-CM

## 2021-12-18 DIAGNOSIS — E039 Hypothyroidism, unspecified: Secondary | ICD-10-CM

## 2021-12-18 DIAGNOSIS — I10 Essential (primary) hypertension: Secondary | ICD-10-CM | POA: Diagnosis not present

## 2021-12-18 DIAGNOSIS — Z6823 Body mass index (BMI) 23.0-23.9, adult: Secondary | ICD-10-CM | POA: Diagnosis not present

## 2021-12-18 NOTE — Progress Notes (Signed)
[name],acting as a Education administrator for [name], MD.,have documented all relevant documentation on the behalf of [name], MD,as directed by  [name], MD while in the presence of [name], MD.    Subjective:     Patient ID: Shawn Fernandez , male    DOB: [birth year] , 76 y.o.   MRN: [id number]   Chief Complaint  Patient presents with   Hypertension    HPI  He presents today for a bp and thyroid recheck. Patient is compliant with his meds. He denies having any headaches, chest pain and shortness of breath.   He also reports compliance with thyroid meds, levothyroxine 14mg daily.   Hypertension This is a chronic problem. The current episode started more than 1 year ago. The problem has been gradually improving since onset. The problem is controlled. Pertinent negatives include no blurred vision. Risk factors for coronary artery disease include male gender and dyslipidemia.     Past Medical History:  Diagnosis Date   Blood clot in abdominal vein    [facility]  Carotid artery disease (HCC)    Dyslipidemia    FHx: heart disease    History of blood transfusion 8-9 yrs ago   HTN (hypertension)    Peripheral vascular disease (HCC)      Family History  Problem Relation Age of Onset   Heart disease Brother    Heart disease Mother    Cancer Father        ? type     Current Outpatient Medications:    albuterol (VENTOLIN HFA) 108 (90 Base) MCG/ACT inhaler, TAKE 2 PUFFS BY MOUTH EVERY 6 HOURS AS NEEDED FOR WHEEZE OR SHORTNESS OF BREATH, Disp: 8.5 each, Rfl: 1   amLODipine (NORVASC) 10 MG tablet, TAKE 1 TABLET BY MOUTH AT BEDTIME, Disp: 90 tablet, Rfl: 2   aspirin EC 81 MG tablet, Take 81 mg by mouth daily., Disp: , Rfl:    benzonatate (TESSALON) 100 MG capsule, Take 1 capsule (100 mg total) by mouth every 8 (eight) hours as needed for cough., Disp: 21 capsule, Rfl: 0   Cholecalciferol (VITAMIN D3) 1000 units CAPS, Take 1 capsule by mouth  daily., Disp: , Rfl:    fluticasone (FLONASE) 50 MCG/ACT nasal spray, Place 1 spray into both nostrils daily for 3 days., Disp: 16 g, Rfl: 0   hydrALAZINE (APRESOLINE) 25 MG tablet, Take 1 tablet (25 mg total) by mouth 3 (three) times daily. ONE TABLET BY MOUTH AT BREAKFAST LUNCH AND BEDTIME., Disp: 180 tablet, Rfl: 3   iron polysaccharides (NIFEREX) 150 MG capsule, TAKE 1 CAPSULE BY MOUTH EVERY DAY, Disp: 90 capsule, Rfl: 1   levothyroxine (SYNTHROID) 100 MCG tablet, TAKE 1 TABLET BY MOUTH EVERY DAY, Disp: 90 tablet, Rfl: 1   Multiple Vitamin (MULTIVITAMIN WITH MINERALS) TABS tablet, Take 1 tablet by mouth daily., Disp: , Rfl:    pantoprazole (PROTONIX) 40 MG tablet, TAKE 1 TABLET BY MOUTH EVERY DAY, Disp: 90 tablet, Rfl: 2   sertraline (ZOLOFT) 25 MG tablet, TAKE 1 TABLET (25 MG TOTAL) BY MOUTH DAILY., Disp: 90 tablet, Rfl: 1   simvastatin (ZOCOR) 20 MG tablet, TAKE 1 TABLET BY MOUTH EVERY DAY IN THE EVENING, Disp: 90 tablet, Rfl: 1   valsartan (DIOVAN) 160 MG tablet, Take 1 tablet (160 mg total) by mouth daily before breakfast., Disp: 90 tablet, Rfl: 1   Allergies  Allergen Reactions   Lisinopril Cough     Review  of Systems  Constitutional: Negative.   Eyes: Negative.  Negative for blurred vision.  Respiratory: Negative.    Cardiovascular: Negative.   Gastrointestinal: Negative.   Musculoskeletal: Negative.   Skin: Negative.   Neurological: Negative.   Psychiatric/Behavioral: Negative.       Today's Vitals   12/18/21 1550  BP: 120/72  Pulse: 66  Temp: 97.6 F (36.4 C)  Weight: 144 lb (65.3 kg)  Height: '5\' 6"'$  (1.676 m)  PainSc: 0-No pain   Body mass index is 23.24 kg/m.  Wt Readings from Last 3 Encounters:  12/18/21 144 lb (65.3 kg)  11/20/21 145 lb (65.8 kg)  10/30/21 145 lb 9.6 oz (66 kg)     Objective:  Physical Exam Vitals and nursing note reviewed.  Constitutional:      Appearance: Normal appearance.  HENT:     Head: Normocephalic and atraumatic.     Nose:      Comments: MASKED     Mouth/Throat:     Comments: MASKED  Eyes:     Extraocular Movements: Extraocular movements intact.  Cardiovascular:     Rate and Rhythm: Normal rate and regular rhythm.     Heart sounds: Normal heart sounds.  Pulmonary:     Effort: Pulmonary effort is normal.     Breath sounds: Normal breath sounds.  Musculoskeletal:     Cervical back: Normal range of motion.  Skin:    General: Skin is warm.  Neurological:     General: No focal deficit present.     Mental Status: He is alert.  Psychiatric:        Mood and Affect: Mood normal.       Assessment And Plan:     1. Essential hypertension, benign Comments: Chronic, well controlled. He will c/w amlodipine '10mg'$  qd, hydralazine '25mg'$  tid and valsartan '160mg'$  daily. He will f/u late Nov 2023 for CPE.   2. Acquired hypothyroidism Comments: I will check thyroid panel and adjust meds as needed. He will f/u in 3-4 months for re-evaluation.  - TSH + free T4  3. BMI 23.0-23.9, adult Comments: He is encouraged to aim for at least 150 minutes of exercise per week.   4. Herpes zoster vaccination declined   Patient was given opportunity to ask questions. Patient verbalized understanding of the plan and was able to repeat key elements of the plan. All questions were answered to their satisfaction.   I, [name], MD, have reviewed all documentation for this visit. The documentation on 12/18/21 for the exam, diagnosis, procedures, and orders are all accurate and complete.   IF YOU HAVE BEEN REFERRED TO A SPECIALIST, IT MAY TAKE 1-2 WEEKS TO SCHEDULE/PROCESS THE REFERRAL. IF YOU HAVE NOT HEARD FROM US/SPECIALIST IN TWO WEEKS, PLEASE GIVE [place] A CALL AT [phone].   THE PATIENT IS ENCOURAGED TO PRACTICE SOCIAL DISTANCING DUE TO THE COVID-19 PANDEMIC.

## 2021-12-18 NOTE — Patient Instructions (Signed)
Hypertension, Adult ?Hypertension is another name for high blood pressure. High blood pressure forces your heart to work harder to pump blood. This can cause problems over time. ?There are two numbers in a blood pressure reading. There is a top number (systolic) over a bottom number (diastolic). It is best to have a blood pressure that is below 120/80. ?What are the causes? ?The cause of this condition is not known. Some other conditions can lead to high blood pressure. ?What increases the risk? ?Some lifestyle factors can make you more likely to develop high blood pressure: ?Smoking. ?Not getting enough exercise or physical activity. ?Being overweight. ?Having too much fat, sugar, calories, or salt (sodium) in your diet. ?Drinking too much alcohol. ?Other risk factors include: ?Having any of these conditions: ?Heart disease. ?Diabetes. ?High cholesterol. ?Kidney disease. ?Obstructive sleep apnea. ?Having a family history of high blood pressure and high cholesterol. ?Age. The risk increases with age. ?Stress. ?What are the signs or symptoms? ?High blood pressure may not cause symptoms. Very high blood pressure (hypertensive crisis) may cause: ?Headache. ?Fast or uneven heartbeats (palpitations). ?Shortness of breath. ?Nosebleed. ?Vomiting or feeling like you may vomit (nauseous). ?Changes in how you see. ?Very bad chest pain. ?Feeling dizzy. ?Seizures. ?How is this treated? ?This condition is treated by making healthy lifestyle changes, such as: ?Eating healthy foods. ?Exercising more. ?Drinking less alcohol. ?Your doctor may prescribe medicine if lifestyle changes do not help enough and if: ?Your top number is above 130. ?Your bottom number is above 80. ?Your personal target blood pressure may vary. ?Follow these instructions at home: ?Eating and drinking ? ?If told, follow the DASH eating plan. To follow this plan: ?Fill one half of your plate at each meal with fruits and vegetables. ?Fill one fourth of your plate  at each meal with whole grains. Whole grains include whole-wheat pasta, brown rice, and whole-grain bread. ?Eat or drink low-fat dairy products, such as skim milk or low-fat yogurt. ?Fill one fourth of your plate at each meal with low-fat (lean) proteins. Low-fat proteins include fish, chicken without skin, eggs, beans, and tofu. ?Avoid fatty meat, cured and processed meat, or chicken with skin. ?Avoid pre-made or processed food. ?Limit the amount of salt in your diet to less than 1,500 mg each day. ?Do not drink alcohol if: ?Your doctor tells you not to drink. ?You are pregnant, may be pregnant, or are planning to become pregnant. ?If you drink alcohol: ?Limit how much you have to: ?0-1 drink a day for women. ?0-2 drinks a day for men. ?Know how much alcohol is in your drink. In the [place] one drink equals one 12 oz bottle of beer (355 mL), one 5 oz glass of wine (148 mL), or one 1? oz glass of hard liquor (44 mL). ?Lifestyle ? ?Work with your doctor to stay at a healthy weight or to lose weight. Ask your doctor what the best weight is for you. ?Get at least 30 minutes of exercise that causes your heart to beat faster (aerobic exercise) most days of the week. This may include walking, swimming, or biking. ?Get at least 30 minutes of exercise that strengthens your muscles (resistance exercise) at least 3 days a week. This may include lifting weights or doing Pilates. ?Do not smoke or use any products that contain nicotine or tobacco. If you need help quitting, ask your doctor. ?Check your blood pressure at home as told by your doctor. ?Keep all follow-up visits. ?Medicines ?Take over-the-counter and prescription medicines   only as told by your doctor. Follow directions carefully. ?Do not skip doses of blood pressure medicine. The medicine does not work as well if you skip doses. Skipping doses also puts you at risk for problems. ?Ask your doctor about side effects or reactions to medicines that you should watch  for. ?Contact a doctor if: ?You think you are having a reaction to the medicine you are taking. ?You have headaches that keep coming back. ?You feel dizzy. ?You have swelling in your ankles. ?You have trouble with your vision. ?Get help right away if: ?You get a very bad headache. ?You start to feel mixed up (confused). ?You feel weak or numb. ?You feel faint. ?You have very bad pain in your: ?Chest. ?Belly (abdomen). ?You vomit more than once. ?You have trouble breathing. ?These symptoms may be an emergency. Get help right away. Call 911. ?Do not wait to see if the symptoms will go away. ?Do not drive yourself to the hospital. ?Summary ?Hypertension is another name for high blood pressure. ?High blood pressure forces your heart to work harder to pump blood. ?For most people, a normal blood pressure is less than 120/80. ?Making healthy choices can help lower blood pressure. If your blood pressure does not get lower with healthy choices, you may need to take medicine. ?This information is not intended to replace advice given to you by your health care provider. Make sure you discuss any questions you have with your health care provider. ?Document Revised: 12/06/2020 Document Reviewed: 12/06/2020 ?Elsevier Patient Education ? 2023 [employer] Inc. ? ?

## 2021-12-19 LAB — TSH+FREE T4
Free T4: 1.19 ng/dL (ref 0.82–1.77)
TSH: 6.08 u[IU]/mL — ABNORMAL HIGH (ref 0.450–4.500)

## 2021-12-26 ENCOUNTER — Ambulatory Visit: Payer: Medicare Other

## 2022-01-02 ENCOUNTER — Ambulatory Visit: Payer: Medicare Other

## 2022-01-17 ENCOUNTER — Other Ambulatory Visit: Payer: Self-pay

## 2022-01-20 ENCOUNTER — Other Ambulatory Visit: Payer: Self-pay

## 2022-01-20 MED ORDER — LEVOTHYROXINE SODIUM 100 MCG PO TABS
100.0000 ug | ORAL_TABLET | Freq: Every day | ORAL | 1 refills | Status: DC
Start: 2022-01-20 — End: 2022-04-28

## 2022-01-25 ENCOUNTER — Other Ambulatory Visit: Payer: Self-pay | Admitting: Internal Medicine

## 2022-01-29 ENCOUNTER — Ambulatory Visit (INDEPENDENT_AMBULATORY_CARE_PROVIDER_SITE_OTHER): Payer: Medicare Other | Admitting: Internal Medicine

## 2022-01-29 ENCOUNTER — Encounter: Payer: Self-pay | Admitting: Internal Medicine

## 2022-01-29 VITALS — BP 138/70 | HR 80 | Temp 98.2°F | Ht 66.0 in | Wt 144.8 lb

## 2022-01-29 DIAGNOSIS — I270 Primary pulmonary hypertension: Secondary | ICD-10-CM

## 2022-01-29 DIAGNOSIS — Z6823 Body mass index (BMI) 23.0-23.9, adult: Secondary | ICD-10-CM

## 2022-01-29 DIAGNOSIS — D500 Iron deficiency anemia secondary to blood loss (chronic): Secondary | ICD-10-CM

## 2022-01-29 DIAGNOSIS — Z0000 Encounter for general adult medical examination without abnormal findings: Secondary | ICD-10-CM | POA: Diagnosis not present

## 2022-01-29 DIAGNOSIS — H6121 Impacted cerumen, right ear: Secondary | ICD-10-CM

## 2022-01-29 DIAGNOSIS — E7800 Pure hypercholesterolemia, unspecified: Secondary | ICD-10-CM

## 2022-01-29 DIAGNOSIS — I10 Essential (primary) hypertension: Secondary | ICD-10-CM

## 2022-01-29 LAB — POCT URINALYSIS DIPSTICK
Bilirubin, UA: NEGATIVE
Blood, UA: NEGATIVE
Glucose, UA: NEGATIVE
Leukocytes, UA: NEGATIVE
Nitrite, UA: NEGATIVE
Protein, UA: POSITIVE — AB
Spec Grav, UA: >=1.03 — AB (ref 1.010–1.025)
Urobilinogen, UA: 2 E.U./dL — AB
pH, UA: 6.5 (ref 5.0–8.0)

## 2022-01-29 NOTE — Progress Notes (Signed)
Barnet Glasgow Martin,acting as a Education administrator for [name], MD.,have documented all relevant documentation on the behalf of [name], MD,as directed by  [name], MD while in the presence of [name], MD.   Subjective:     Patient ID: Shawn Fernandez , male    DOB: 1945-04-15 , 76 y.o.   MRN: [id number]   Chief Complaint  Patient presents with   Annual Exam   Hypertension    HPI  He is here today for a full physical exam. He has no specific concerns or complaints at this time. He reports compliance with meds.  He denies having any headaches, chest pain and shortness of breath.   BP Readings from Last 3 Encounters: 01/29/22 : 138/70 12/18/21 : 120/72 12/11/21 : (!) 152/67    Hypertension This is a chronic problem. The current episode started more than 1 year ago. The problem has been gradually improving since onset. The problem is controlled. Pertinent negatives include no blurred vision, chest pain, palpitations or shortness of breath. Risk factors for coronary artery disease include smoking/tobacco exposure, sedentary lifestyle and male gender. The current treatment provides moderate improvement. Compliance problems include exercise.      Past Medical History:  Diagnosis Date   Blood clot in abdominal vein    treated at [facility]   Carotid artery disease (HCC)    Dyslipidemia    FHx: heart disease    History of blood transfusion 8-9 yrs ago   HTN (hypertension)    Peripheral vascular disease (HCC)      Family History  Problem Relation Age of Onset   Heart disease Brother    Heart disease Mother    Cancer Father        ? type     Current Outpatient Medications:    albuterol (VENTOLIN HFA) 108 (90 Base) MCG/ACT inhaler, TAKE 2 PUFFS BY MOUTH EVERY 6 HOURS AS NEEDED FOR WHEEZE OR SHORTNESS OF BREATH, Disp: 8.5 each, Rfl: 1   amLODipine (NORVASC) 10 MG tablet, TAKE 1 TABLET BY MOUTH EVERYDAY AT BEDTIME, Disp: 90 tablet, Rfl: 2   aspirin EC 81 MG  tablet, Take 81 mg by mouth daily., Disp: , Rfl:    benzonatate (TESSALON) 100 MG capsule, Take 1 capsule (100 mg total) by mouth every 8 (eight) hours as needed for cough., Disp: 21 capsule, Rfl: 0   Cholecalciferol (VITAMIN D3) 1000 units CAPS, Take 1 capsule by mouth daily., Disp: , Rfl:    fluticasone (FLONASE) 50 MCG/ACT nasal spray, Place 1 spray into both nostrils daily for 3 days., Disp: 16 g, Rfl: 0   hydrALAZINE (APRESOLINE) 25 MG tablet, Take 1 tablet (25 mg total) by mouth 3 (three) times daily. ONE TABLET BY MOUTH AT BREAKFAST LUNCH AND BEDTIME., Disp: 180 tablet, Rfl: 3   iron polysaccharides (NIFEREX) 150 MG capsule, TAKE 1 CAPSULE BY MOUTH EVERY DAY, Disp: 90 capsule, Rfl: 1   levothyroxine (SYNTHROID) 100 MCG tablet, Take 1 tablet (100 mcg total) by mouth daily., Disp: 90 tablet, Rfl: 1   Multiple Vitamin (MULTIVITAMIN WITH MINERALS) TABS tablet, Take 1 tablet by mouth daily., Disp: , Rfl:    pantoprazole (PROTONIX) 40 MG tablet, TAKE 1 TABLET BY MOUTH EVERY DAY, Disp: 90 tablet, Rfl: 2   sertraline (ZOLOFT) 25 MG tablet, TAKE 1 TABLET (25 MG TOTAL) BY MOUTH DAILY., Disp: 90 tablet, Rfl: 1   simvastatin (ZOCOR) 20 MG tablet, TAKE 1 TABLET BY MOUTH EVERY DAY IN THE  EVENING, Disp: 90 tablet, Rfl: 1   valsartan (DIOVAN) 160 MG tablet, TAKE 1 TABLET BY MOUTH EVERY DAY BEFORE BREAKFAST, Disp: 90 tablet, Rfl: 1   Allergies  Allergen Reactions   Lisinopril Cough     Men's preventive visit. Patient Health Questionnaire (PHQ-2) is  [place] Office Visit from 01/29/2022 in [facility]  PHQ-2 Total Score 0     . Patient is on a healthy diet. Marital status: Married. Relevant history for alcohol use is:  Social History   Substance and Sexual Activity  Alcohol Use Not Currently   Comment: only occ  . Relevant history for tobacco use is:  Social History   Tobacco Use  Smoking Status Former   Packs/day: 0.75   Years: 20.00   Total pack years: 15.00    Types: Cigarettes   Quit date: 07/01/2009   Years since quitting: 12.6  Smokeless Tobacco Never  .   Review of Systems  Constitutional: Negative.   HENT: Negative.    Eyes: Negative.  Negative for blurred vision.  Respiratory: Negative.  Negative for shortness of breath.   Cardiovascular: Negative.  Negative for chest pain and palpitations.  Gastrointestinal: Negative.   Endocrine: Negative.   Genitourinary: Negative.   Musculoskeletal: Negative.   Skin: Negative.   Allergic/Immunologic: Negative.   Hematological: Negative.   Psychiatric/Behavioral: Negative.       Today's Vitals   01/29/22 1413  BP: 138/70  Pulse: 80  Temp: 98.2 F (36.8 C)  TempSrc: Oral  Weight: 144 lb 12.8 oz (65.7 kg)  Height: _0  (1.676 m)  PainSc: 0-No pain   Body mass index is 23.37 kg/m.  Wt Readings from Last 3 Encounters:  01/30/22 144 lb (65.3 kg)  01/29/22 144 lb 12.8 oz (65.7 kg)  12/18/21 144 lb (65.3 kg)   BP Readings from Last 3 Encounters:  01/30/22 132/72  01/29/22 138/70  12/18/21 120/72    Objective:  Physical Exam Vitals and nursing note reviewed.  Constitutional:      Appearance: Normal appearance.  HENT:     Head: Normocephalic and atraumatic.     Right Ear: Ear canal and external ear normal. There is impacted cerumen.     Left Ear: Tympanic membrane, ear canal and external ear normal.     Nose:     Comments: Masked     Mouth/Throat:     Comments: Masked  Eyes:     Extraocular Movements: Extraocular movements intact.     Conjunctiva/sclera: Conjunctivae normal.     Pupils: Pupils are equal, round, and reactive to light.  Cardiovascular:     Rate and Rhythm: Normal rate and regular rhythm.     Pulses: Normal pulses.     Heart sounds: Normal heart sounds.  Pulmonary:     Effort: Pulmonary effort is normal.     Breath sounds: Normal breath sounds.  Abdominal:     General: Abdomen is flat. Bowel sounds are normal.     Palpations: Abdomen is soft.   Genitourinary:    Comments: deferred Musculoskeletal:        General: Normal range of motion.     Cervical back: Normal range of motion and neck supple.     Comments: Congenital abnormality LUE  Skin:    General: Skin is warm and dry.  Neurological:     General: No focal deficit present.     Mental Status: He is alert and oriented to person, place, and time.  Psychiatric:  Mood and Affect: Mood normal.        Behavior: Behavior normal.       Assessment And Plan:    1. Annual physical exam Comments: A full exam was performed. DRE deferred, per patient request.  PATIENT IS ADVISED TO GET 30-45 MINUTES REGULAR EXERCISE NO LESS THAN FOUR TO FIVE DAYS PER WEEK - BOTH WEIGHTBEARING EXERCISES AND AEROBIC ARE RECOMMENDED.  PATIENT IS ADVISED TO FOLLOW A HEALTHY DIET WITH AT LEAST SIX FRUITS/VEGGIES PER DAY, DECREASE INTAKE OF RED MEAT, AND TO INCREASE FISH INTAKE TO TWO DAYS PER WEEK.  MEATS/FISH SHOULD NOT BE FRIED, BAKED OR BROILED IS PREFERABLE.  IT IS ALSO IMPORTANT TO CUT BACK ON YOUR SUGAR INTAKE. PLEASE AVOID ANYTHING WITH ADDED SUGAR, CORN SYRUP OR OTHER SWEETENERS. IF YOU MUST USE A SWEETENER, YOU CAN TRY STEVIA. IT IS ALSO IMPORTANT TO AVOID ARTIFICIALLY SWEETENERS AND DIET BEVERAGES. LASTLY, I SUGGEST WEARING SPF 50 SUNSCREEN ON EXPOSED PARTS AND ESPECIALLY WHEN IN THE DIRECT SUNLIGHT FOR AN EXTENDED PERIOD OF TIME.  PLEASE AVOID FAST FOOD RESTAURANTS AND INCREASE YOUR WATER INTAKE.  2. Essential hypertension, benign Comments: Chronic, fair control.  EKG performed, NSR w/ nonspecifc T abnormalities.  Importance of medication/dietary compliance was d/w patient. F/u 3 months.  He will c/w amlodipine 54m, hydralazine 233mpo tid and valsartan 16030maily. He is encouraged to follow a low sodium diet.  - EKG 12-Lead - POCT urinalysis dipstick - Microalbumin / creatinine urine ratio - CMP14+EGFR - Lipid panel  3. Pure hypercholesterolemia Comments: Chronic, currently on  simvastatin 75m23mily. He is encouraged to follow heart healthy lifestyle. - Lipid panel  4. Right ear impacted cerumen Comments: After obtaining verbal consent, right ear was flushed by irrigation w/o complication. NO TM abnormalities were noted. - Ear Lavage  5. Iron deficiency anemia due to chronic blood loss Comments: I will check labs as below. I will replete as needed. - CBC  6. BMI 23.0-23.9, adult Comments: He is encouraged to aim for at least 150 minutes of exericse per week.  Patient was given opportunity to ask questions. Patient verbalized understanding of the plan and was able to repeat key elements of the plan. All questions were answered to their satisfaction.   I, RobyMaximino Greenland, have reviewed all documentation for this visit. The documentation on 01/29/22 for the exam, diagnosis, procedures, and orders are all accurate and complete.   THE PATIENT IS ENCOURAGED TO PRACTICE SOCIAL DISTANCING DUE TO THE COVID-19 PANDEMIC.

## 2022-01-29 NOTE — Patient Instructions (Signed)
Health Maintenance, Male Adopting a healthy lifestyle and getting preventive care are important in promoting health and wellness. Ask your health care provider about: The right schedule for you to have regular tests and exams. Things you can do on your own to prevent diseases and keep yourself healthy. What should I know about diet, weight, and exercise? Eat a healthy diet  Eat a diet that includes plenty of vegetables, fruits, low-fat dairy products, and lean protein. Do not eat a lot of foods that are high in solid fats, added sugars, or sodium. Maintain a healthy weight Body mass index (BMI) is a measurement that can be used to identify possible weight problems. It estimates body fat based on height and weight. Your health care provider can help determine your BMI and help you achieve or maintain a healthy weight. Get regular exercise Get regular exercise. This is one of the most important things you can do for your health. Most adults should: Exercise for at least 150 minutes each week. The exercise should increase your heart rate and make you sweat (moderate-intensity exercise). Do strengthening exercises at least twice a week. This is in addition to the moderate-intensity exercise. Spend less time sitting. Even light physical activity can be beneficial. Watch cholesterol and blood lipids Have your blood tested for lipids and cholesterol at 76 years of age, then have this test every 5 years. You may need to have your cholesterol levels checked more often if: Your lipid or cholesterol levels are high. You are older than 76 years of age. You are at high risk for heart disease. What should I know about cancer screening? Many types of cancers can be detected early and may often be prevented. Depending on your health history and family history, you may need to have cancer screening at various ages. This may include screening for: Colorectal cancer. Prostate cancer. Skin cancer. Lung  cancer. What should I know about heart disease, diabetes, and high blood pressure? Blood pressure and heart disease High blood pressure causes heart disease and increases the risk of stroke. This is more likely to develop in people who have high blood pressure readings or are overweight. Talk with your health care provider about your target blood pressure readings. Have your blood pressure checked: Every 3-5 years if you are 18-39 years of age. Every year if you are 40 years old or older. If you are between the ages of 65 and 75 and are a current or former smoker, ask your health care provider if you should have a one-time screening for abdominal aortic aneurysm (AAA). Diabetes Have regular diabetes screenings. This checks your fasting blood sugar level. Have the screening done: Once every three years after age 45 if you are at a normal weight and have a low risk for diabetes. More often and at a younger age if you are overweight or have a high risk for diabetes. What should I know about preventing infection? Hepatitis B If you have a higher risk for hepatitis B, you should be screened for this virus. Talk with your health care provider to find out if you are at risk for hepatitis B infection. Hepatitis C Blood testing is recommended for: Everyone born from 1945 through 1965. Anyone with known risk factors for hepatitis C. Sexually transmitted infections (STIs) You should be screened each year for STIs, including gonorrhea and chlamydia, if: You are sexually active and are younger than 76 years of age. You are older than 76 years of age and your   health care provider tells you that you are at risk for this type of infection. Your sexual activity has changed since you were last screened, and you are at increased risk for chlamydia or gonorrhea. Ask your health care provider if you are at risk. Ask your health care provider about whether you are at high risk for HIV. Your health care provider  may recommend a prescription medicine to help prevent HIV infection. If you choose to take medicine to prevent HIV, you should first get tested for HIV. You should then be tested every 3 months for as long as you are taking the medicine. Follow these instructions at home: Alcohol use Do not drink alcohol if your health care provider tells you not to drink. If you drink alcohol: Limit how much you have to 0-2 drinks a day. Know how much alcohol is in your drink. In the [place] one drink equals one 12 oz bottle of beer (355 mL), one 5 oz glass of wine (148 mL), or one 1 oz glass of hard liquor (44 mL). Lifestyle Do not use any products that contain nicotine or tobacco. These products include cigarettes, chewing tobacco, and vaping devices, such as e-cigarettes. If you need help quitting, ask your health care provider. Do not use street drugs. Do not share needles. Ask your health care provider for help if you need support or information about quitting drugs. General instructions Schedule regular health, dental, and eye exams. Stay current with your vaccines. Tell your health care provider if: You often feel depressed. You have ever been abused or do not feel safe at home. Summary Adopting a healthy lifestyle and getting preventive care are important in promoting health and wellness. Follow your health care provider's instructions about healthy diet, exercising, and getting tested or screened for diseases. Follow your health care provider's instructions on monitoring your cholesterol and blood pressure. This information is not intended to replace advice given to you by your health care provider. Make sure you discuss any questions you have with your health care provider. Document Revised: 07/09/2020 Document Reviewed: 07/09/2020 Elsevier Patient Education  2023 Elsevier Inc.  

## 2022-01-30 ENCOUNTER — Ambulatory Visit (INDEPENDENT_AMBULATORY_CARE_PROVIDER_SITE_OTHER): Payer: Medicare Other

## 2022-01-30 VITALS — BP 132/72 | HR 82 | Temp 98.1°F | Ht 66.0 in | Wt 144.0 lb

## 2022-01-30 DIAGNOSIS — Z23 Encounter for immunization: Secondary | ICD-10-CM

## 2022-01-30 LAB — CMP14+EGFR
ALT: 10 IU/L (ref 0–44)
AST: 27 IU/L (ref 0–40)
Albumin/Globulin Ratio: 1.6 (ref 1.2–2.2)
Albumin: 4.6 g/dL (ref 3.8–4.8)
Alkaline Phosphatase: 112 IU/L (ref 44–121)
BUN/Creatinine Ratio: 13 (ref 10–24)
BUN: 13 mg/dL (ref 8–27)
Bilirubin Total: 0.4 mg/dL (ref 0.0–1.2)
CO2: 21 mmol/L (ref 20–29)
Calcium: 9.5 mg/dL (ref 8.6–10.2)
Chloride: 106 mmol/L (ref 96–106)
Creatinine, Ser: 1.03 mg/dL (ref 0.76–1.27)
Globulin, Total: 2.8 g/dL (ref 1.5–4.5)
Glucose: 95 mg/dL (ref 70–99)
Potassium: 4.5 mmol/L (ref 3.5–5.2)
Sodium: 142 mmol/L (ref 134–144)
Total Protein: 7.4 g/dL (ref 6.0–8.5)
eGFR: 75 mL/min/{1.73_m2} (ref >59)

## 2022-01-30 LAB — LIPID PANEL
Chol/HDL Ratio: 1.9 ratio (ref 0.0–5.0)
Cholesterol, Total: 155 mg/dL (ref 100–199)
HDL: 80 mg/dL (ref >39)
LDL Chol Calc (NIH): 65 mg/dL (ref 0–99)
Triglycerides: 45 mg/dL (ref 0–149)
VLDL Cholesterol Cal: 10 mg/dL (ref 5–40)

## 2022-01-30 LAB — CBC
Hematocrit: 36.5 % — ABNORMAL LOW (ref 37.5–51.0)
Hemoglobin: 11.8 g/dL — ABNORMAL LOW (ref 13.0–17.7)
MCH: 28.3 pg (ref 26.6–33.0)
MCHC: 32.3 g/dL (ref 31.5–35.7)
MCV: 88 fL (ref 79–97)
Platelets: 220 10*3/uL (ref 150–450)
RBC: 4.17 x10E6/uL (ref 4.14–5.80)
RDW: 11.3 % — ABNORMAL LOW (ref 11.6–15.4)
WBC: 4.6 10*3/uL (ref 3.4–10.8)

## 2022-01-30 NOTE — Patient Instructions (Signed)

## 2022-01-30 NOTE — Progress Notes (Signed)
Pt presents today for second shingrix.

## 2022-01-31 ENCOUNTER — Other Ambulatory Visit: Payer: Self-pay

## 2022-01-31 LAB — MICROALBUMIN / CREATININE URINE RATIO
Creatinine, Urine: 321.7 mg/dL
Microalb/Creat Ratio: 4 mg/g creat (ref 0–29)
Microalbumin, Urine: 12.6 ug/mL

## 2022-02-01 LAB — SPECIMEN STATUS REPORT

## 2022-02-01 LAB — IRON AND TIBC
Iron Saturation: 23 % (ref 15–55)
Iron: 85 ug/dL (ref 38–169)
Total Iron Binding Capacity: 364 ug/dL (ref 250–450)
UIBC: 279 ug/dL (ref 111–343)

## 2022-02-07 ENCOUNTER — Other Ambulatory Visit: Payer: Self-pay | Admitting: Internal Medicine

## 2022-02-07 DIAGNOSIS — I10 Essential (primary) hypertension: Secondary | ICD-10-CM

## 2022-03-31 ENCOUNTER — Other Ambulatory Visit: Payer: Medicare Other

## 2022-03-31 DIAGNOSIS — E039 Hypothyroidism, unspecified: Secondary | ICD-10-CM

## 2022-04-01 LAB — TSH+FREE T4
Free T4: 0.95 ng/dL (ref 0.82–1.77)
TSH: 4.49 u[IU]/mL (ref 0.450–4.500)

## 2022-04-19 ENCOUNTER — Other Ambulatory Visit: Payer: Self-pay | Admitting: Hematology

## 2022-04-19 ENCOUNTER — Other Ambulatory Visit: Payer: Self-pay | Admitting: Internal Medicine

## 2022-04-28 ENCOUNTER — Other Ambulatory Visit: Payer: Self-pay

## 2022-04-28 MED ORDER — LEVOTHYROXINE SODIUM 100 MCG PO TABS: 100.0000 ug | ORAL_TABLET | Freq: Every day | ORAL | 1 refills | Status: DC

## 2022-05-15 ENCOUNTER — Ambulatory Visit: Payer: Medicare Other | Admitting: Internal Medicine

## 2022-05-15 ENCOUNTER — Ambulatory Visit: Payer: Medicare Other

## 2022-05-21 ENCOUNTER — Ambulatory Visit: Payer: Medicare Other

## 2022-05-21 ENCOUNTER — Telehealth: Payer: Self-pay

## 2022-05-21 NOTE — Telephone Encounter (Signed)
This nurse attempted to call patient for AWV. The number had a message that it was not taking calls.

## 2022-06-04 ENCOUNTER — Ambulatory Visit (INDEPENDENT_AMBULATORY_CARE_PROVIDER_SITE_OTHER): Payer: Medicare Other

## 2022-06-04 VITALS — Ht 69.0 in | Wt 142.0 lb

## 2022-06-04 DIAGNOSIS — Z0000 Encounter for general adult medical examination without abnormal findings: Secondary | ICD-10-CM | POA: Diagnosis not present

## 2022-06-04 NOTE — Progress Notes (Signed)
I connected with  Shawn Fernandez on 06/04/22 by a audio enabled telemedicine application and verified that I am speaking with the correct person using two identifiers.  Patient Location: Home  Provider Location: Office/Clinic  I discussed the limitations of evaluation and management by telemedicine. The patient expressed understanding and agreed to proceed.  Subjective:   Shawn Fernandez is a 77 y.o. male who presents for Medicare Annual/Subsequent preventive examination.  Review of Systems     Cardiac Risk Factors include: advanced age (>90men, >7 women);dyslipidemia;hypertension;male gender     Objective:    Today's Vitals   06/04/22 1224 06/04/22 1225  Weight: 142 lb (64.4 kg)   Height: 5\' 9"  (1.753 m)   PainSc:  6    Body mass index is 20.97 kg/m.     06/04/2022   12:28 PM 04/25/2021    2:18 PM 04/19/2020    2:39 PM 09/28/2019    3:15 PM 08/09/2019    3:52 PM 09/15/2018    2:11 PM 12/16/2017    2:48 PM  Advanced Directives  Does Patient Have a Medical Advance Directive? Yes Yes Yes Yes No Yes Yes  Type of Paramedic of [place];Living will [place];Living will [place];Living will Living will;Healthcare Power of [place];Living will Living will  Does patient want to make changes to medical advance directive?       No - Patient declined  Copy of [place] in Chart? No - copy requested No - copy requested No - copy requested No - copy requested  No - copy requested   Would patient like information on creating a medical advance directive?     No - Patient declined      Current Medications (verified) Outpatient Encounter Medications as of 06/04/2022  Medication Sig   albuterol (VENTOLIN HFA) 108 (90 Base) MCG/ACT inhaler TAKE 2 PUFFS BY MOUTH EVERY 6 HOURS AS NEEDED FOR WHEEZE OR SHORTNESS OF BREATH   amLODipine (NORVASC) 10 MG tablet TAKE 1 TABLET BY MOUTH  EVERYDAY AT BEDTIME   aspirin EC 81 MG tablet Take 81 mg by mouth daily.   benzonatate (TESSALON) 100 MG capsule Take 1 capsule (100 mg total) by mouth every 8 (eight) hours as needed for cough.   Cholecalciferol (VITAMIN D3) 1000 units CAPS Take 1 capsule by mouth daily.   hydrALAZINE (APRESOLINE) 25 MG tablet TAKE 1 TABLET (25 MG TOTAL) BY MOUTH 3 (THREE) TIMES DAILY. ONE TABLET BY MOUTH AT BREAKFAST LUNCH AND BEDTIME.   iron polysaccharides (NIFEREX) 150 MG capsule TAKE 1 CAPSULE BY MOUTH EVERY DAY   levothyroxine (SYNTHROID) 100 MCG tablet Take 1 tablet (100 mcg total) by mouth daily.   Multiple Vitamin (MULTIVITAMIN WITH MINERALS) TABS tablet Take 1 tablet by mouth daily.   pantoprazole (PROTONIX) 40 MG tablet TAKE 1 TABLET BY MOUTH EVERY DAY   sertraline (ZOLOFT) 25 MG tablet TAKE 1 TABLET (25 MG TOTAL) BY MOUTH DAILY.   simvastatin (ZOCOR) 20 MG tablet TAKE 1 TABLET BY MOUTH EVERY DAY IN THE EVENING   valsartan (DIOVAN) 160 MG tablet TAKE 1 TABLET BY MOUTH EVERY DAY BEFORE BREAKFAST   fluticasone (FLONASE) 50 MCG/ACT nasal spray Place 1 spray into both nostrils daily for 3 days.   No facility-administered encounter medications on file as of 06/04/2022.    Allergies (verified) Lisinopril   History: Past Medical History:  Diagnosis Date   Blood clot in abdominal vein  [facility] at [facility]   Carotid artery disease    Dyslipidemia    FHx: heart disease    History of blood transfusion 8-9 yrs ago   HTN (hypertension)    Peripheral vascular disease    Past Surgical History:  Procedure Laterality Date   ANKLE SURGERY Right yrs ago   arm surgery Left yrs ago   2 rods inserted, 1 rod later removed   COLONOSCOPY WITH PROPOFOL N/A 09/16/2013   Procedure: COLONOSCOPY WITH PROPOFOL;  Surgeon: Beryle Beams, MD;  Location: [facility] ENDOSCOPY;  Service: Endoscopy;  Laterality: N/A;   LOWER EXTREMITY ANGIOGRAM N/A 07/24/2011   Procedure: LOWER EXTREMITY ANGIOGRAM;  Surgeon: Lorretta Harp,  MD;  Location: [facility] CATH LAB;  Service: Cardiovascular;  Laterality: N/A;   stent in abdominal vein  4-5 yrs ago   US ECHOCARDIOGRAPHY  07-20-2007   EF 55-60%   Family History  Problem Relation Age of Onset   Heart disease Brother    Heart disease Mother    Cancer Father        ? type   Social History   Socioeconomic History   Marital status: Married    Spouse name: Not on file   Number of children: Not on file   Years of education: Not on file   Highest education level: Not on file  Occupational History   Occupation: retired  Tobacco Use   Smoking status: Former    Packs/day: 0.75    Years: 20.00    Additional pack years: 0.00    Total pack years: 15.00    Types: Cigarettes    Quit date: 07/01/2009    Years since quitting: 12.9   Smokeless tobacco: Never  Vaping Use   Vaping Use: Never used  Substance and Sexual Activity   Alcohol use: Not Currently    Comment: only occ   Drug use: No   Sexual activity: Yes  Other Topics Concern   Not on file  Social History Narrative   Not on file   Social Determinants of Health   Financial Resource Strain: Low Risk  (06/04/2022)   Overall Financial Resource Strain (CARDIA)    Difficulty of Paying Living Expenses: Not hard at all  Food Insecurity: No Food Insecurity (06/04/2022)   Hunger Vital Sign    Worried About Running Out of Food in the Last Year: Never true    [place] in the Last Year: Never true  Transportation Needs: No Transportation Needs (06/04/2022)   PRAPARE - Hydrologist (Medical): No    Lack of Transportation (Non-Medical): No  Physical Activity: Inactive (06/04/2022)   Exercise Vital Sign    Days of Exercise per Week: 0 days    Minutes of Exercise per Session: 0 min  Stress: No Stress Concern Present (06/04/2022)   [place]    Feeling of Stress : Not at all  Social Connections: Not on file    Tobacco  Counseling Counseling given: Not Answered   Clinical Intake:  Pre-visit preparation completed: Yes  Pain : 0-10 Pain Score: 6  Pain Type: Acute pain Pain Location: Knee Pain Orientation: Right Pain Descriptors / Indicators: Aching Pain Onset: More than a month ago Pain Frequency: Intermittent     Nutritional Status: BMI 25 -29 Overweight Nutritional Risks: None  How often do you need to have someone help you when you read instructions, pamphlets, or other written materials from your  doctor or pharmacy?: 1 - Never  Diabetic? no  Interpreter Needed?: No  Information entered by :: NAllen LPN   Activities of Daily Living    06/04/2022   12:29 PM  In your present state of health, do you have any difficulty performing the following activities:  Hearing? 1  Comment no hearing aids  Vision? 0  Difficulty concentrating or making decisions? 0  Walking or climbing stairs? 0  Dressing or bathing? 0  Doing errands, shopping? 0  Preparing Food and eating ? N  Using the Toilet? N  In the past six months, have you accidently leaked urine? N  Do you have problems with loss of bowel control? N  Managing your Medications? N  Managing your Finances? N  Housekeeping or managing your Housekeeping? N    Patient Care Team: [name], MD as PCP - General (Internal Medicine) Lorretta Harp, MD as PCP - Cardiology (Cardiology) Lorretta Harp, MD as Consulting Physician (Cardiology)  Indicate any recent Medical Services you may have received from other than [facility] providers in the past year (date may be approximate).     Assessment:   This is a routine wellness examination for Shawn Fernandez.  Hearing/Vision screen Vision Screening - Comments:: Regular eye exams, [facility]  Dietary issues and exercise activities discussed: Current Exercise Habits: The patient does not participate in regular exercise at present   Goals Addressed             This Visit's Progress     Patient Stated       06/04/2022, stay healthy       Depression Screen    06/04/2022   12:29 PM 01/29/2022    2:12 PM 04/25/2021    2:18 PM 04/19/2020    2:40 PM 09/28/2019    3:16 PM 07/13/2019    2:12 PM 11/09/2018    2:24 PM  PHQ 2/9 Scores  PHQ - 2 Score 0 0 0 0 0 0 0  PHQ- 9 Score     0      Fall Risk    06/04/2022   12:29 PM 01/29/2022    2:11 PM 04/25/2021    2:18 PM 04/19/2020    2:40 PM 09/28/2019    3:16 PM  [place] in the past year? 0 0 0 0 0  Number falls in past yr: 0 0     Injury with Fall? 0 0     Risk for fall due to : Medication side effect No Fall Risks Medication side effect Medication side effect Medication side effect  Follow up Falls prevention discussed;Education provided;Falls evaluation completed Falls evaluation completed Falls evaluation completed;Education provided;Falls prevention discussed Falls evaluation completed;Education provided;Falls prevention discussed Falls evaluation completed;Education provided;Falls prevention discussed    FALL RISK PREVENTION PERTAINING TO THE HOME:  Any stairs in or around the home? Yes  If so, are there any without handrails? No  Home free of loose throw rugs in walkways, pet beds, electrical cords, etc? Yes  Adequate lighting in your home to reduce risk of falls? Yes   ASSISTIVE DEVICES UTILIZED TO PREVENT FALLS:  Life alert? No  Use of a cane, walker or w/c? No  Grab bars in the bathroom? Yes  Shower chair or bench in shower? No  Elevated toilet seat or a handicapped toilet? No   TIMED UP AND GO:  Was the test performed? No .      Cognitive Function:  06/04/2022   12:29 PM 04/25/2021    2:19 PM 04/19/2020    2:40 PM 09/28/2019    3:17 PM 09/15/2018    2:15 PM  6CIT Screen  What Year? 0 points 0 points 0 points 0 points 0 points  What month? 0 points 0 points 0 points 0 points 0 points  What time? 0 points 0 points 0 points 0 points 0 points  Count back from 20 2 points 0 points 0 points  0 points 0 points  Months in reverse 0 points 2 points 4 points 4 points 0 points  Repeat phrase 0 points 6 points 2 points 2 points 0 points  Total Score 2 points 8 points 6 points 6 points 0 points    Immunizations Immunization History  Administered Date(s) Administered   Fluad Quad(high Dose 65+) 12/04/2020, 11/20/2021   Influenza, High Dose Seasonal PF 11/04/2016, 12/02/2017, 01/01/2019, 12/28/2019   Influenza-Unspecified 12/04/2017, 01/01/2019   PFIZER(Purple Top)SARS-COV-2 Vaccination 04/14/2019, 05/09/2019, 01/11/2020, 07/17/2020   Pneumococcal Conjugate-13 12/04/2017   Pneumococcal Polysaccharide-23 11/30/2014   Pneumococcal-Unspecified 11/30/2014   Zoster Recombinat (Shingrix) 09/10/2021, 01/30/2022    TDAP status: Due, Education has been provided regarding the importance of this vaccine. Advised may receive this vaccine at local pharmacy or Health Dept. Aware to provide a copy of the vaccination record if obtained from local pharmacy or Health Dept. Verbalized acceptance and understanding.  Flu Vaccine status: Up to date  Pneumococcal vaccine status: Up to date  Covid-19 vaccine status: Completed vaccines  Qualifies for Shingles Vaccine? Yes   Zostavax completed Yes   Shingrix Completed?: Yes  Screening Tests Health Maintenance  Topic Date Due   DTaP/Tdap/Td (1 - Tdap) Never done   COVID-19 Vaccine (5 - 2023-24 season) 11/01/2021   Medicare Annual Wellness (AWV)  04/25/2022   INFLUENZA VACCINE  10/02/2022   Pneumonia Vaccine 18+ Years old  Completed   Hepatitis C Screening  Completed   Zoster Vaccines- Shingrix  Completed   HPV VACCINES  Aged Out   COLONOSCOPY (Pts 45-85yrs Insurance coverage will need to be confirmed)  Discontinued    Health Maintenance  Health Maintenance Due  Topic Date Due   DTaP/Tdap/Td (1 - Tdap) Never done   COVID-19 Vaccine (5 - 2023-24 season) 11/01/2021   Medicare Annual Wellness (AWV)  04/25/2022    Colorectal cancer  screening: No longer required.   Lung Cancer Screening: (Low Dose CT Chest recommended if Age 34-80 years, 30 pack-year currently smoking OR have quit w/in 15years.) does not qualify.   Lung Cancer Screening Referral: no  Additional Screening:  Hepatitis C Screening: does qualify; Completed 11/03/2018  Vision Screening: Recommended annual ophthalmology exams for early detection of glaucoma and other disorders of the eye. Is the patient up to date with their annual eye exam?  Yes  Who is the provider or what is the name of the office in which the patient attends annual eye exams? [facility] If pt is not established with a provider, would they like to be referred to a provider to establish care? No .   Dental Screening: Recommended annual dental exams for proper oral hygiene  Community Resource Referral / Chronic Care Management: CRR required this visit?  No   CCM required this visit?  No      Plan:     I have personally reviewed and noted the following in the patient's chart:   Medical and social history Use of alcohol, tobacco or illicit drugs  Current medications  and supplements including opioid prescriptions. Patient is not currently taking opioid prescriptions. Functional ability and status Nutritional status Physical activity Advanced directives List of other physicians Hospitalizations, surgeries, and ER visits in previous 12 months Vitals Screenings to include cognitive, depression, and falls Referrals and appointments  In addition, I have reviewed and discussed with patient certain preventive protocols, quality metrics, and best practice recommendations. A written personalized care plan for preventive services as well as general preventive health recommendations were provided to patient.     Kellie Simmering, LPN   [id number]   Nurse Notes: none  Due to this being a virtual visit, the after visit summary with patients personalized plan was offered to patient  via mail or my-chart.  Patient would like to access on my-chart

## 2022-06-04 NOTE — Patient Instructions (Signed)
Shawn Fernandez , Thank you for taking time to come for your Medicare Wellness Visit. I appreciate your ongoing commitment to your health goals. Please review the following plan we discussed and let me know if I can assist you in the future.   These are the goals we discussed:  Goals      Patient Stated     Stay healthy     Patient Stated     09/28/2019, stay healthy     Patient Stated     04/19/2020, stay healthy     Patient Stated     04/25/2021, get rid of the thyroid problems     Patient Stated     06/04/2022, stay healthy        This is a list of the screening recommended for you and due dates:  Health Maintenance  Topic Date Due   DTaP/Tdap/Td vaccine (1 - Tdap) Never done   COVID-19 Vaccine (5 - 2023-24 season) 11/01/2021   Flu Shot  10/02/2022   Medicare Annual Wellness Visit  06/04/2023   Pneumonia Vaccine  Completed   Hepatitis C Screening: USPSTF Recommendation to screen - Ages 18-79 yo.  Completed   Zoster (Shingles) Vaccine  Completed   HPV Vaccine  Aged Out   Colon Cancer Screening  Discontinued    Advanced directives: Please bring a copy of your POA (Power of Attorney) and/or Living Will to your next appointment.   Conditions/risks identified: none  Next appointment: Follow up in one year for your annual wellness visit.   Preventive Care 58 Years and Older, Male  Preventive care refers to lifestyle choices and visits with your health care provider that can promote health and wellness. What does preventive care include? A yearly physical exam. This is also called an annual well check. Dental exams once or twice a year. Routine eye exams. Ask your health care provider how often you should have your eyes checked. Personal lifestyle choices, including: Daily care of your teeth and gums. Regular physical activity. Eating a healthy diet. Avoiding tobacco and drug use. Limiting alcohol use. Practicing safe sex. Taking low doses of aspirin every day. Taking  vitamin and mineral supplements as recommended by your health care provider. What happens during an annual well check? The services and screenings done by your health care provider during your annual well check will depend on your age, overall health, lifestyle risk factors, and family history of disease. Counseling  Your health care provider may ask you questions about your: Alcohol use. Tobacco use. Drug use. Emotional well-being. Home and relationship well-being. Sexual activity. Eating habits. History of falls. Memory and ability to understand (cognition). Work and work Statistician. Screening  You may have the following tests or measurements: Height, weight, and BMI. Blood pressure. Lipid and cholesterol levels. These may be checked every 5 years, or more frequently if you are over 77 years old. Skin check. Lung cancer screening. You may have this screening every year starting at age 77 if you have a 30-pack-year history of smoking and currently smoke or have quit within the past 15 years. Fecal occult blood test (FOBT) of the stool. You may have this test every year starting at age 77. Flexible sigmoidoscopy or colonoscopy. You may have a sigmoidoscopy every 5 years or a colonoscopy every 10 years starting at age 77. Prostate cancer screening. Recommendations will vary depending on your family history and other risks. Hepatitis C blood test. Hepatitis B blood test. Sexually transmitted disease (STD) testing. Diabetes  screening. This is done by checking your blood sugar (glucose) after you have not eaten for a while (fasting). You may have this done every 1-3 years. Abdominal aortic aneurysm (AAA) screening. You may need this if you are a current or former smoker. Osteoporosis. You may be screened starting at age 77 if you are at high risk. Talk with your health care provider about your test results, treatment options, and if necessary, the need for more tests. Vaccines  Your  health care provider may recommend certain vaccines, such as: Influenza vaccine. This is recommended every year. Tetanus, diphtheria, and acellular pertussis (Tdap, Td) vaccine. You may need a Td booster every 10 years. Zoster vaccine. You may need this after age 77 Pneumococcal 13-valent conjugate (PCV13) vaccine. One dose is recommended after age 10. Pneumococcal polysaccharide (PPSV23) vaccine. One dose is recommended after age 77. Talk to your health care provider about which screenings and vaccines you need and how often you need them. This information is not intended to replace advice given to you by your health care provider. Make sure you discuss any questions you have with your health care provider. Document Released: 03/16/2015 Document Revised: 11/07/2015 Document Reviewed: 12/19/2014 Elsevier Interactive Patient Education  2017 [place] Prevention in the Home Falls can cause injuries. They can happen to people of all ages. There are many things you can do to make your home safe and to help prevent falls. What can I do on the outside of my home? Regularly fix the edges of walkways and driveways and fix any cracks. Remove anything that might make you trip as you walk through a door, such as a raised step or threshold. Trim any bushes or trees on the path to your home. Use bright outdoor lighting. Clear any walking paths of anything that might make someone trip, such as rocks or tools. Regularly check to see if handrails are loose or broken. Make sure that both sides of any steps have handrails. Any raised decks and porches should have guardrails on the edges. Have any leaves, snow, or ice cleared regularly. Use sand or salt on walking paths during winter. Clean up any spills in your garage right away. This includes oil or grease spills. What can I do in the bathroom? Use night lights. Install grab bars by the toilet and in the tub and shower. Do not use towel bars as  grab bars. Use non-skid mats or decals in the tub or shower. If you need to sit down in the shower, use a plastic, non-slip stool. Keep the floor dry. Clean up any water that spills on the floor as soon as it happens. Remove soap buildup in the tub or shower regularly. Attach bath mats securely with double-sided non-slip rug tape. Do not have throw rugs and other things on the floor that can make you trip. What can I do in the bedroom? Use night lights. Make sure that you have a light by your bed that is easy to reach. Do not use any sheets or blankets that are too big for your bed. They should not hang down onto the floor. Have a firm chair that has side arms. You can use this for support while you get dressed. Do not have throw rugs and other things on the floor that can make you trip. What can I do in the kitchen? Clean up any spills right away. Avoid walking on wet floors. Keep items that you use a lot in easy-to-reach places.  If you need to reach something above you, use a strong step stool that has a grab bar. Keep electrical cords out of the way. Do not use floor polish or wax that makes floors slippery. If you must use wax, use non-skid floor wax. Do not have throw rugs and other things on the floor that can make you trip. What can I do with my stairs? Do not leave any items on the stairs. Make sure that there are handrails on both sides of the stairs and use them. Fix handrails that are broken or loose. Make sure that handrails are as long as the stairways. Check any carpeting to make sure that it is firmly attached to the stairs. Fix any carpet that is loose or worn. Avoid having throw rugs at the top or bottom of the stairs. If you do have throw rugs, attach them to the floor with carpet tape. Make sure that you have a light switch at the top of the stairs and the bottom of the stairs. If you do not have them, ask someone to add them for you. What else can I do to help prevent  falls? Wear shoes that: Do not have high heels. Have rubber bottoms. Are comfortable and fit you well. Are closed at the toe. Do not wear sandals. If you use a stepladder: Make sure that it is fully opened. Do not climb a closed stepladder. Make sure that both sides of the stepladder are locked into place. Ask someone to hold it for you, if possible. Clearly mark and make sure that you can see: Any grab bars or handrails. First and last steps. Where the edge of each step is. Use tools that help you move around (mobility aids) if they are needed. These include: Canes. Walkers. Scooters. Crutches. Turn on the lights when you go into a dark area. Replace any light bulbs as soon as they burn out. Set up your furniture so you have a clear path. Avoid moving your furniture around. If any of your floors are uneven, fix them. If there are any pets around you, be aware of where they are. Review your medicines with your doctor. Some medicines can make you feel dizzy. This can increase your chance of falling. Ask your doctor what other things that you can do to help prevent falls. This information is not intended to replace advice given to you by your health care provider. Make sure you discuss any questions you have with your health care provider. Document Released: 12/14/2008 Document Revised: 07/26/2015 Document Reviewed: 03/24/2014 Elsevier Interactive Patient Education  2017 [employer].

## 2022-06-11 ENCOUNTER — Encounter: Payer: Self-pay | Admitting: Internal Medicine

## 2022-06-11 ENCOUNTER — Ambulatory Visit (INDEPENDENT_AMBULATORY_CARE_PROVIDER_SITE_OTHER): Payer: Medicare Other | Admitting: Internal Medicine

## 2022-06-11 VITALS — BP 150/60 | HR 98 | Temp 98.7°F | Ht 69.0 in | Wt 145.2 lb

## 2022-06-11 DIAGNOSIS — I779 Disorder of arteries and arterioles, unspecified: Secondary | ICD-10-CM

## 2022-06-11 DIAGNOSIS — E039 Hypothyroidism, unspecified: Secondary | ICD-10-CM

## 2022-06-11 DIAGNOSIS — M25561 Pain in right knee: Secondary | ICD-10-CM

## 2022-06-11 DIAGNOSIS — I10 Essential (primary) hypertension: Secondary | ICD-10-CM | POA: Diagnosis not present

## 2022-06-11 NOTE — Patient Instructions (Signed)
Acute Knee Pain, Adult Acute knee pain is sudden and may be caused by damage, swelling, or irritation of the muscles and tissues that support the knee. Pain may result from: A fall. An injury to the knee from twisting motions. A hit to the knee. Infection. Acute knee pain may go away on its own with time and rest. If it does not, your health care provider may order tests to find the cause of the pain. These may include: Imaging tests, such as an X-ray, MRI, CT scan, or ultrasound. Joint aspiration. In this test, fluid is removed from the knee and evaluated. Arthroscopy. In this test, a lighted tube is inserted into the knee and an image is projected onto a TV screen. Biopsy. In this test, a sample of tissue is removed from the body and studied under a microscope. Follow these instructions at home: If you have a knee sleeve or brace:  Wear the knee sleeve or brace as told by your health care provider. Remove it only as told by your health care provider. Loosen it if your toes tingle, become numb, or turn cold and blue. Keep it clean. If the knee sleeve or brace is not waterproof: Do not let it get wet. Cover it with a watertight covering when you take a bath or shower. Activity Rest your knee. Do not do things that cause pain or make pain worse. Avoid high-impact activities or exercises, such as running, jumping rope, or doing jumping jacks. Work with a physical therapist to make a safe exercise program, as recommended by your health care provider. Do exercises as told by your physical therapist. Managing pain, stiffness, and swelling  If directed, put ice on the affected knee. To do this: If you have a removable knee sleeve or brace, remove it as told by your health care provider. Put ice in a plastic bag. Place a towel between your skin and the bag. Leave the ice on for 20 minutes, 2-3 times a day. Remove the ice if your skin turns bright red. This is very important. If you cannot  feel pain, heat, or cold, you have a greater risk of damage to the area. If directed, use an elastic bandage to put pressure (compression) on your injured knee. This may control swelling, give support, and help with discomfort. Raise (elevate) your knee above the level of your heart while you are sitting or lying down. Sleep with a pillow under your knee. General instructions Take over-the-counter and prescription medicines only as told by your health care provider. Do not use any products that contain nicotine or tobacco, such as cigarettes, e-cigarettes, and chewing tobacco. If you need help quitting, ask your health care provider. If you are overweight, work with your health care provider and a dietitian to set a weight-loss goal that is healthy and reasonable for you. Extra weight can put pressure on your knee. Pay attention to any changes in your symptoms. Keep all follow-up visits. This is important. Contact a health care provider if: Your knee pain continues, changes, or gets worse. You have a fever along with knee pain. Your knee feels warm to the touch or is red. Your knee buckles or locks up. Get help right away if: Your knee swells, and the swelling becomes worse. You cannot move your knee. You have severe pain in your knee that cannot be managed with pain medicine. Summary Acute knee pain can be caused by a fall, an injury, an infection, or damage, swelling, or irritation   of the tissues that support your knee. Your health care provider may perform tests to find out the cause of the pain. Pay attention to any changes in your symptoms. Relieve your pain with rest, medicines, light activity, and the use of ice. Get help right away if your knee swells, you cannot move your knee, or you have severe pain that cannot be managed with medicine. This information is not intended to replace advice given to you by your health care provider. Make sure you discuss any questions you have with  your health care provider. Document Revised: 08/03/2019 Document Reviewed: 08/03/2019 Elsevier Patient Education  2023 Elsevier Inc.  

## 2022-06-11 NOTE — Progress Notes (Signed)
Hershal Coria Martin,acting as a Neurosurgeon for Gwynneth Aliment, MD.,have documented all relevant documentation on the behalf of Gwynneth Aliment, MD,as directed by  Gwynneth Aliment, MD while in the presence of Gwynneth Aliment, MD.    Subjective:     Patient ID: Shawn Fernandez , male    DOB: 04-Apr-1945 , 77 y.o.   MRN: [id number]   Chief Complaint  Patient presents with   Knee Pain    HPI  Pt presents today for knee pain. Patient states his right knee has been sore and achy for about 3  weeks. Patient doesn't recall hitting knee or having any falls. He states knee hurts when he first gets out of the bed. He has tried some topical pain creams with some relief.   BP Readings from Last 3 Encounters: 06/11/22 : (!) 160/70 01/30/22 : 132/72 01/29/22 : 138/70    Knee Pain  The incident occurred more than 1 week ago. There was no injury mechanism. The pain is present in the right knee. The quality of the pain is described as aching. The pain is at a severity of 6/10. The pain is moderate.     Past Medical History:  Diagnosis Date   Blood clot in abdominal vein    treated at [facility]   Carotid artery disease    Dyslipidemia    FHx: heart disease    History of blood transfusion 8-9 yrs ago   HTN (hypertension)    Peripheral vascular disease      Family History  Problem Relation Age of Onset   Heart disease Brother    Heart disease Mother    Cancer Father        ? type     Current Outpatient Medications:    albuterol (VENTOLIN HFA) 108 (90 Base) MCG/ACT inhaler, TAKE 2 PUFFS BY MOUTH EVERY 6 HOURS AS NEEDED FOR WHEEZE OR SHORTNESS OF BREATH, Disp: 8.5 each, Rfl: 1   amLODipine (NORVASC) 10 MG tablet, TAKE 1 TABLET BY MOUTH EVERYDAY AT BEDTIME, Disp: 90 tablet, Rfl: 2   aspirin EC 81 MG tablet, Take 81 mg by mouth daily., Disp: , Rfl:    benzonatate (TESSALON) 100 MG capsule, Take 1 capsule (100 mg total) by mouth every 8 (eight) hours as needed for cough., Disp: 21 capsule, Rfl:  0   Cholecalciferol (VITAMIN D3) 1000 units CAPS, Take 1 capsule by mouth daily., Disp: , Rfl:    fluticasone (FLONASE) 50 MCG/ACT nasal spray, Place 1 spray into both nostrils daily for 3 days., Disp: 16 g, Rfl: 0   hydrALAZINE (APRESOLINE) 25 MG tablet, TAKE 1 TABLET (25 MG TOTAL) BY MOUTH 3 (THREE) TIMES DAILY. ONE TABLET BY MOUTH AT BREAKFAST LUNCH AND BEDTIME., Disp: 270 tablet, Rfl: 2   iron polysaccharides (NIFEREX) 150 MG capsule, TAKE 1 CAPSULE BY MOUTH EVERY DAY, Disp: 90 capsule, Rfl: 1   levothyroxine (SYNTHROID) 100 MCG tablet, Take 1 tablet (100 mcg total) by mouth daily., Disp: 90 tablet, Rfl: 1   Multiple Vitamin (MULTIVITAMIN WITH MINERALS) TABS tablet, Take 1 tablet by mouth daily., Disp: , Rfl:    pantoprazole (PROTONIX) 40 MG tablet, TAKE 1 TABLET BY MOUTH EVERY DAY, Disp: 90 tablet, Rfl: 2   sertraline (ZOLOFT) 25 MG tablet, TAKE 1 TABLET (25 MG TOTAL) BY MOUTH DAILY., Disp: 90 tablet, Rfl: 1   simvastatin (ZOCOR) 20 MG tablet, TAKE 1 TABLET BY MOUTH EVERY DAY IN THE EVENING, Disp: 90 tablet, Rfl: 1  valsartan (DIOVAN) 160 MG tablet, TAKE 1 TABLET BY MOUTH EVERY DAY BEFORE BREAKFAST, Disp: 90 tablet, Rfl: 1   Allergies  Allergen Reactions   Lisinopril Cough     Review of Systems  Constitutional: Negative.   Cardiovascular: Negative.   Gastrointestinal: Negative.   Genitourinary: Negative.   Musculoskeletal:  Positive for arthralgias.  Skin: Negative.   Allergic/Immunologic: Negative.   Neurological: Negative.   Hematological: Negative.      Today's Vitals   06/11/22 1022 06/11/22 1035  BP: (!) 160/70 (!) 150/60  Pulse: 98   Temp: 98.7 F (37.1 C)   TempSrc: Oral   Weight: 145 lb 3.2 oz (65.9 kg)   Height: 5\' 9"  (1.753 m)   PainSc: 4    PainLoc: Knee    Body mass index is 21.44 kg/m.  Wt Readings from Last 3 Encounters:  06/11/22 145 lb 3.2 oz (65.9 kg)  06/04/22 142 lb (64.4 kg)  01/30/22 144 lb (65.3 kg)    Objective:  Physical Exam Vitals and  nursing note reviewed.  Constitutional:      Appearance: Normal appearance.  HENT:     Head: Normocephalic and atraumatic.     Nose:     Comments: Masked     Mouth/Throat:     Comments: Masked  Eyes:     Extraocular Movements: Extraocular movements intact.  Cardiovascular:     Rate and Rhythm: Normal rate and regular rhythm.     Heart sounds: Normal heart sounds.  Pulmonary:     Effort: Pulmonary effort is normal.     Breath sounds: Normal breath sounds.  Musculoskeletal:        General: No signs of injury.     Cervical back: Normal range of motion.     Right lower leg: No edema.     Left lower leg: No edema.     Comments: Mild crepitus on R  Skin:    General: Skin is warm.  Neurological:     General: No focal deficit present.     Mental Status: He is alert.  Psychiatric:        Mood and Affect: Mood normal.     Assessment And Plan:     1. Acute pain of right knee Comments: I will refer him to Ortho for further evaluation and radiographic studies. Advised to take Tylenol prn. - Ambulatory referral to Orthopedic Surgery  2. Essential hypertension, benign Comments: Chronic, uncontrolled. He admits he just took meds prior to his appt .He will f/u for re-evaluation. Pain likely exacerbates BP as well. - CMP14+EGFR  3. Carotid artery disease, unspecified laterality, unspecified type Comments: Chronic, he will c/w statin and ASA therapy.  4. Acquired hypothyroidism Comments: Chronic, currently taking Synthroid daily. I will check TSh and adjust meds as needed. - TSH   Patient was given opportunity to ask questions. Patient verbalized understanding of the plan and was able to repeat key elements of the plan. All questions were answered to their satisfaction.   I, Gwynneth Aliment, MD, have reviewed all documentation for this visit. The documentation on 06/11/22 for the exam, diagnosis, procedures, and orders are all accurate and complete.   IF YOU HAVE BEEN  REFERRED TO A SPECIALIST, IT MAY TAKE 1-2 WEEKS TO SCHEDULE/PROCESS THE REFERRAL. IF YOU HAVE NOT HEARD FROM US/SPECIALIST IN TWO WEEKS, PLEASE GIVE [place] A CALL AT [phone].   THE PATIENT IS ENCOURAGED TO PRACTICE SOCIAL DISTANCING DUE TO THE COVID-19 PANDEMIC.

## 2022-06-12 LAB — CMP14+EGFR
ALT: 8 IU/L (ref 0–44)
AST: 26 IU/L (ref 0–40)
Albumin/Globulin Ratio: 1.7 (ref 1.2–2.2)
Albumin: 4.3 g/dL (ref 3.8–4.8)
Alkaline Phosphatase: 124 IU/L — ABNORMAL HIGH (ref 44–121)
BUN/Creatinine Ratio: 11 (ref 10–24)
BUN: 11 mg/dL (ref 8–27)
Bilirubin Total: 0.5 mg/dL (ref 0.0–1.2)
CO2: 23 mmol/L (ref 20–29)
Calcium: 9.5 mg/dL (ref 8.6–10.2)
Chloride: 105 mmol/L (ref 96–106)
Creatinine, Ser: 1 mg/dL (ref 0.76–1.27)
Globulin, Total: 2.6 g/dL (ref 1.5–4.5)
Glucose: 90 mg/dL (ref 70–99)
Potassium: 4 mmol/L (ref 3.5–5.2)
Sodium: 142 mmol/L (ref 134–144)
Total Protein: 6.9 g/dL (ref 6.0–8.5)
eGFR: 78 mL/min/{1.73_m2} (ref >59)

## 2022-06-12 LAB — TSH: TSH: 1.23 u[IU]/mL (ref 0.450–4.500)

## 2022-06-19 ENCOUNTER — Other Ambulatory Visit (INDEPENDENT_AMBULATORY_CARE_PROVIDER_SITE_OTHER): Payer: Medicare Other

## 2022-06-19 ENCOUNTER — Ambulatory Visit (INDEPENDENT_AMBULATORY_CARE_PROVIDER_SITE_OTHER): Payer: Medicare Other | Admitting: Orthopaedic Surgery

## 2022-06-19 DIAGNOSIS — M25561 Pain in right knee: Secondary | ICD-10-CM

## 2022-06-19 MED ORDER — DICLOFENAC SODIUM 75 MG PO TBEC
75.0000 mg | DELAYED_RELEASE_TABLET | Freq: Two times a day (BID) | ORAL | 2 refills | Status: DC
Start: 2022-06-19 — End: 2023-04-27

## 2022-06-19 NOTE — Progress Notes (Signed)
Office Visit Note   Patient: Shawn Fernandez           Date of Birth: 1945-11-05           MRN: [id number] Visit Date: 06/19/2022              Requested by: Dorothyann Peng, MD [address] [place],  [place] [address] PCP: Dorothyann Peng, MD   Assessment & Plan: Visit Diagnoses:  1. Acute pain of right knee     Plan: Impression is 77 year old gentleman with right knee pain likely osteoarthritis flare.  No joint effusion.  Treatment options discussed and he would like to try a course of diclofenac for 2 weeks.  He will continue to use Voltaren gel and knee brace during activity.  Home exercises provided for quad strengthening.  He will follow-up if he decides he wants to try a steroid injection.  Follow-Up Instructions: No follow-ups on file.   Orders:  Orders Placed This Encounter  Procedures   XR KNEE 3 VIEW RIGHT   Meds ordered this encounter  Medications   diclofenac (VOLTAREN) 75 MG EC tablet    Sig: Take 1 tablet (75 mg total) by mouth 2 (two) times daily.    Dispense:  30 tablet    Refill:  2      Procedures: No procedures performed   Clinical Data: No additional findings.   Subjective: Chief Complaint  Patient presents with   Right Knee - Pain    HPI  Shawn Fernandez is a very pleasant 77 year old gentleman husband of Shawn Fernandez who is also a patient of mine.  He comes in for evaluation of pain and soreness inside the knee for about a month.  Denies any injuries.  Denies any swelling or locking.  Mainly has pain when he transitions from sit to stand.  Has been taking Tylenol which does not really help all that much.  Review of Systems  Constitutional: Negative.   HENT: Negative.    Eyes: Negative.   Respiratory: Negative.    Cardiovascular: Negative.   Gastrointestinal: Negative.   Endocrine: Negative.   Genitourinary: Negative.   Skin: Negative.   Allergic/Immunologic: Negative.   Neurological: Negative.   Hematological: Negative.    Psychiatric/Behavioral: Negative.    All other systems reviewed and are negative.    Objective: Vital Signs: There were no vitals taken for this visit.  Physical Exam Vitals and nursing note reviewed.  Constitutional:      Appearance: He is well-developed.  HENT:     Head: Normocephalic and atraumatic.  Eyes:     Pupils: Pupils are equal, round, and reactive to light.  Pulmonary:     Effort: Pulmonary effort is normal.  Abdominal:     Palpations: Abdomen is soft.  Musculoskeletal:        General: Normal range of motion.     Cervical back: Neck supple.  Skin:    General: Skin is warm.  Neurological:     Mental Status: He is alert and oriented to person, place, and time.  Psychiatric:        Behavior: Behavior normal.        Thought Content: Thought content normal.        Judgment: Judgment normal.     Right Knee Exam   Muscle Strength  The patient has normal right knee strength.  Tenderness  The patient is experiencing no tenderness.   Range of Motion  The patient has normal right knee ROM.  Other  Sensation: normal Pulse: present Swelling: none    Specialty Comments:  No specialty comments available.  Imaging: XR KNEE 3 VIEW RIGHT  Result Date: 06/19/2022 Mild osteoarthritis without any significant degenerative changes.    PMFS History: Patient Active Problem List   Diagnosis Date Noted   BMI 22.0-22.9, adult 08/17/2021   Acquired hypothyroidism 11/21/2017   Iron deficiency anemia due to chronic blood loss 03/29/2015   Anemia 03/15/2015   Hyperthyroidism 01/04/2015   Claudication (HCC) 06/29/2014   Essential hypertension 10/27/2012   Hyperlipidemia 10/27/2012   Peripheral arterial disease 10/27/2012   Carotid artery disease 10/27/2012   Cough 11/06/2011   Past Medical History:  Diagnosis Date   Blood clot in abdominal vein    treated at [facility]   Carotid artery disease    Dyslipidemia    FHx: heart disease    History of blood  transfusion 8-9 yrs ago   HTN (hypertension)    Peripheral vascular disease     Family History  Problem Relation Age of Onset   Heart disease Brother    Heart disease Mother    Cancer Father        ? type    Past Surgical History:  Procedure Laterality Date   ANKLE SURGERY Right yrs ago   arm surgery Left yrs ago   2 rods inserted, 1 rod later removed   COLONOSCOPY WITH PROPOFOL N/A 09/16/2013   Procedure: COLONOSCOPY WITH PROPOFOL;  Surgeon: Theda Belfast, MD;  Location: [facility] ENDOSCOPY;  Service: Endoscopy;  Laterality: N/A;   LOWER EXTREMITY ANGIOGRAM N/A 07/24/2011   Procedure: LOWER EXTREMITY ANGIOGRAM;  Surgeon: Runell Gess, MD;  Location: [facility] CATH LAB;  Service: Cardiovascular;  Laterality: N/A;   stent in abdominal vein  4-5 yrs ago   US ECHOCARDIOGRAPHY  07-20-2007   EF 55-60%   Social History   Occupational History   Occupation: retired  Tobacco Use   Smoking status: Former    Packs/day: 0.75    Years: 20.00    Additional pack years: 0.00    Total pack years: 15.00    Types: Cigarettes    Quit date: 07/01/2009    Years since quitting: 12.9   Smokeless tobacco: Never  Vaping Use   Vaping Use: Never used  Substance and Sexual Activity   Alcohol use: Not Currently    Comment: only occ   Drug use: No   Sexual activity: Yes

## 2022-06-27 ENCOUNTER — Other Ambulatory Visit: Payer: Self-pay

## 2022-07-14 ENCOUNTER — Ambulatory Visit
Admission: EM | Admit: 2022-07-14 | Discharge: 2022-07-14 | Disposition: A | Discharge status: Home / Self Care | Payer: Medicare Other | Attending: Family Medicine | Admitting: Family Medicine

## 2022-07-14 DIAGNOSIS — R0781 Pleurodynia: Secondary | ICD-10-CM

## 2022-07-14 DIAGNOSIS — M79605 Pain in left leg: Secondary | ICD-10-CM

## 2022-07-14 MED ORDER — TRAMADOL HCL 50 MG PO TABS: 50.0000 mg | ORAL_TABLET | Freq: Four times a day (QID) | ORAL | 0 refills | Status: DC | PRN

## 2022-07-14 NOTE — Discharge Instructions (Signed)
Take tramadol 50 mg-- 1 tablet every 6 hours as needed for pain.  This medication can make you sleepy or dizzy  Go to the med center at [facility] and have your x-rays done.  We will notify you if there is any abnormality
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
## 2022-07-15 ENCOUNTER — Ambulatory Visit (HOSPITAL_BASED_OUTPATIENT_CLINIC_OR_DEPARTMENT_OTHER)
Admission: RE | Admit: 2022-07-15 | Discharge: 2022-07-15 | Disposition: A | Discharge status: Home / Self Care | Payer: Medicare Other | Source: Ambulatory Visit | Attending: Family Medicine | Admitting: Family Medicine

## 2022-07-15 DIAGNOSIS — M79652 Pain in left thigh: Secondary | ICD-10-CM | POA: Insufficient documentation

## 2022-07-15 DIAGNOSIS — R0781 Pleurodynia: Secondary | ICD-10-CM | POA: Diagnosis not present

## 2022-07-29 ENCOUNTER — Other Ambulatory Visit: Payer: Self-pay

## 2022-07-29 NOTE — Progress Notes (Signed)
   Shawn Fernandez [birth year] [id number]  Patient outreached by Buddy Duty , PharmD Candidate on 07/29/2022.  Blood Pressure Readings: Last documented ambulatory systolic blood pressure: 172 Last documented ambulatory diastolic blood pressure: 76 Does the patient have a validated home blood pressure machine?: Yes Pt does not check his BP often.  Medication review was performed. Is the patient taking their medications as prescribed?: Yes  The following barriers to adherence were noted: Does the patient have cost concerns?: No Does the patient have transportation concerns?: No Does the patient occassionally forget to take some of their prescribed medications?: No Does the patient feel like one/some of their medications make them feel poorly?: No Does the patient have questions or concerns about their medications?: No Does the patient have a follow up scheduled with their primary care provider/cardiologist?: Yes   Interventions: Interventions Completed: Medications were reviewed, Patient was educated on proper technique to check home blood pressure and reminded to bring home machine and readings to next provider appointment  The patient has follow up scheduled:  PCP: Dorothyann Peng, MD   Buddy Duty, Student-PharmD

## 2022-07-30 ENCOUNTER — Encounter: Payer: Self-pay | Admitting: Internal Medicine

## 2022-07-30 ENCOUNTER — Ambulatory Visit (INDEPENDENT_AMBULATORY_CARE_PROVIDER_SITE_OTHER): Payer: Medicare Other | Admitting: Internal Medicine

## 2022-07-30 VITALS — BP 146/70 | HR 86 | Temp 97.7°F | Ht 69.0 in | Wt 142.4 lb

## 2022-07-30 DIAGNOSIS — M791 Myalgia, unspecified site: Secondary | ICD-10-CM

## 2022-07-30 DIAGNOSIS — I6522 Occlusion and stenosis of left carotid artery: Secondary | ICD-10-CM

## 2022-07-30 DIAGNOSIS — E039 Hypothyroidism, unspecified: Secondary | ICD-10-CM

## 2022-07-30 DIAGNOSIS — Z23 Encounter for immunization: Secondary | ICD-10-CM

## 2022-07-30 DIAGNOSIS — I119 Hypertensive heart disease without heart failure: Secondary | ICD-10-CM | POA: Diagnosis not present

## 2022-07-30 DIAGNOSIS — D500 Iron deficiency anemia secondary to blood loss (chronic): Secondary | ICD-10-CM

## 2022-07-30 DIAGNOSIS — F17200 Nicotine dependence, unspecified, uncomplicated: Secondary | ICD-10-CM

## 2022-07-30 DIAGNOSIS — I779 Disorder of arteries and arterioles, unspecified: Secondary | ICD-10-CM

## 2022-07-30 DIAGNOSIS — E7800 Pure hypercholesterolemia, unspecified: Secondary | ICD-10-CM

## 2022-07-30 NOTE — Patient Instructions (Signed)
Hypertension, Adult Hypertension is another name for high blood pressure. High blood pressure forces your heart to work harder to pump blood. This can cause problems over time. There are two numbers in a blood pressure reading. There is a top number (systolic) over a bottom number (diastolic). It is best to have a blood pressure that is below 120/80. What are the causes? The cause of this condition is not known. Some other conditions can lead to high blood pressure. What increases the risk? Some lifestyle factors can make you more likely to develop high blood pressure: Smoking. Not getting enough exercise or physical activity. Being overweight. Having too much fat, sugar, calories, or salt (sodium) in your diet. Drinking too much alcohol. Other risk factors include: Having any of these conditions: Heart disease. Diabetes. High cholesterol. Kidney disease. Obstructive sleep apnea. Having a family history of high blood pressure and high cholesterol. Age. The risk increases with age. Stress. What are the signs or symptoms? High blood pressure may not cause symptoms. Very high blood pressure (hypertensive crisis) may cause: Headache. Fast or uneven heartbeats (palpitations). Shortness of breath. Nosebleed. Vomiting or feeling like you may vomit (nauseous). Changes in how you see. Very bad chest pain. Feeling dizzy. Seizures. How is this treated? This condition is treated by making healthy lifestyle changes, such as: Eating healthy foods. Exercising more. Drinking less alcohol. Your doctor may prescribe medicine if lifestyle changes do not help enough and if: Your top number is above 130. Your bottom number is above 80. Your personal target blood pressure may vary. Follow these instructions at home: Eating and drinking  If told, follow the DASH eating plan. To follow this plan: Fill one half of your plate at each meal with fruits and vegetables. Fill one fourth of your plate  at each meal with whole grains. Whole grains include whole-wheat pasta, brown rice, and whole-grain bread. Eat or drink low-fat dairy products, such as skim milk or low-fat yogurt. Fill one fourth of your plate at each meal with low-fat (lean) proteins. Low-fat proteins include fish, chicken without skin, eggs, beans, and tofu. Avoid fatty meat, cured and processed meat, or chicken with skin. Avoid pre-made or processed food. Limit the amount of salt in your diet to less than 1,500 mg each day. Do not drink alcohol if: Your doctor tells you not to drink. You are pregnant, may be pregnant, or are planning to become pregnant. If you drink alcohol: Limit how much you have to: 0-1 drink a day for women. 0-2 drinks a day for men. Know how much alcohol is in your drink. In the [place] one drink equals one 12 oz bottle of beer (355 mL), one 5 oz glass of wine (148 mL), or one 1 oz glass of hard liquor (44 mL). Lifestyle  Work with your doctor to stay at a healthy weight or to lose weight. Ask your doctor what the best weight is for you. Get at least 30 minutes of exercise that causes your heart to beat faster (aerobic exercise) most days of the week. This may include walking, swimming, or biking. Get at least 30 minutes of exercise that strengthens your muscles (resistance exercise) at least 3 days a week. This may include lifting weights or doing Pilates. Do not smoke or use any products that contain nicotine or tobacco. If you need help quitting, ask your doctor. Check your blood pressure at home as told by your doctor. Keep all follow-up visits. Medicines Take over-the-counter and prescription medicines   only as told by your doctor. Follow directions carefully. Do not skip doses of blood pressure medicine. The medicine does not work as well if you skip doses. Skipping doses also puts you at risk for problems. Ask your doctor about side effects or reactions to medicines that you should watch  for. Contact a doctor if: You think you are having a reaction to the medicine you are taking. You have headaches that keep coming back. You feel dizzy. You have swelling in your ankles. You have trouble with your vision. Get help right away if: You get a very bad headache. You start to feel mixed up (confused). You feel weak or numb. You feel faint. You have very bad pain in your: Chest. Belly (abdomen). You vomit more than once. You have trouble breathing. These symptoms may be an emergency. Get help right away. Call 911. Do not wait to see if the symptoms will go away. Do not drive yourself to the hospital. Summary Hypertension is another name for high blood pressure. High blood pressure forces your heart to work harder to pump blood. For most people, a normal blood pressure is less than 120/80. Making healthy choices can help lower blood pressure. If your blood pressure does not get lower with healthy choices, you may need to take medicine. This information is not intended to replace advice given to you by your health care provider. Make sure you discuss any questions you have with your health care provider. Document Revised: 12/06/2020 Document Reviewed: 12/06/2020 Elsevier Patient Education  2024 [employer]  

## 2022-07-30 NOTE — Progress Notes (Signed)
I,Victoria T Hamilton,acting as a scribe for Gwynneth Aliment, MD.,have documented all relevant documentation on the behalf of Gwynneth Aliment, MD,as directed by  Gwynneth Aliment, MD while in the presence of Gwynneth Aliment, MD.    Subjective:     Patient ID: Shawn Fernandez , male    DOB: [birth year] , 77 y.o.   MRN: [id number]   Chief Complaint  Patient presents with   Hypertension   Hypothyroidism   Hyperlipidemia    HPI  He presents today for a bp and thyroid recheck. Patient is compliant with his meds. He denies having any headaches, chest pain and shortness of breath.   He reports experiencing cramps in both legs. He states this issue has gotten worse & happens more frequently. He admits using Voltaren gel at home without any relief of his symptoms. .    Hypertension This is a chronic problem. The current episode started more than 1 year ago. The problem has been gradually improving since onset. The problem is controlled. Pertinent negatives include no blurred vision. Risk factors for coronary artery disease include male gender and dyslipidemia.     Past Medical History:  Diagnosis Date   Blood clot in abdominal vein    treated at [facility]   Carotid artery disease (HCC)    Dyslipidemia    FHx: heart disease    History of blood transfusion 8-9 yrs ago   HTN (hypertension)    Peripheral vascular disease (HCC)      Family History  Problem Relation Age of Onset   Heart disease Brother    Heart disease Mother    Cancer Father        ? type     Current Outpatient Medications:    albuterol (VENTOLIN HFA) 108 (90 Base) MCG/ACT inhaler, TAKE 2 PUFFS BY MOUTH EVERY 6 HOURS AS NEEDED FOR WHEEZE OR SHORTNESS OF BREATH, Disp: 8.5 each, Rfl: 1   amLODipine (NORVASC) 10 MG tablet, TAKE 1 TABLET BY MOUTH EVERYDAY AT BEDTIME, Disp: 90 tablet, Rfl: 2   aspirin EC 81 MG tablet, Take 81 mg by mouth daily., Disp: , Rfl:    Cholecalciferol (VITAMIN D3) 1000 units CAPS, Take 1 capsule  by mouth daily., Disp: , Rfl:    diclofenac (VOLTAREN) 75 MG EC tablet, Take 1 tablet (75 mg total) by mouth 2 (two) times daily., Disp: 30 tablet, Rfl: 2   hydrALAZINE (APRESOLINE) 25 MG tablet, TAKE 1 TABLET (25 MG TOTAL) BY MOUTH 3 (THREE) TIMES DAILY. ONE TABLET BY MOUTH AT BREAKFAST LUNCH AND BEDTIME., Disp: 270 tablet, Rfl: 2   iron polysaccharides (NIFEREX) 150 MG capsule, TAKE 1 CAPSULE BY MOUTH EVERY DAY, Disp: 90 capsule, Rfl: 1   Multiple Vitamin (MULTIVITAMIN WITH MINERALS) TABS tablet, Take 1 tablet by mouth daily., Disp: , Rfl:    pantoprazole (PROTONIX) 40 MG tablet, TAKE 1 TABLET BY MOUTH EVERY DAY, Disp: 90 tablet, Rfl: 2   sertraline (ZOLOFT) 25 MG tablet, TAKE 1 TABLET (25 MG TOTAL) BY MOUTH DAILY., Disp: 90 tablet, Rfl: 1   traMADol (ULTRAM) 50 MG tablet, Take 1 tablet (50 mg total) by mouth every 6 (six) hours as needed (pain)., Disp: 12 tablet, Rfl: 0   valsartan (DIOVAN) 160 MG tablet, TAKE 1 TABLET BY MOUTH EVERY DAY BEFORE BREAKFAST, Disp: 90 tablet, Rfl: 1   atorvastatin (LIPITOR) 10 MG tablet, One tab po daily, except Sundays Start 6/10, Disp: 30 tablet, Rfl: 11   fluticasone (FLONASE) 50 MCG/ACT nasal  spray, Place 1 spray into both nostrils daily for 3 days., Disp: 16 g, Rfl: 0   Allergies  Allergen Reactions   Lisinopril Cough     Review of Systems  Constitutional: Negative.   HENT: Negative.    Eyes:  Negative for blurred vision.  Cardiovascular: Negative.   Gastrointestinal: Negative.   Genitourinary: Negative.   Skin: Negative.   Neurological: Negative.   Psychiatric/Behavioral: Negative.       Today's Vitals   07/30/22 1019 07/30/22 1030  BP: (!) 150/90 (!) 146/70  Pulse: 86   Temp: 97.7 F (36.5 C)   SpO2: 98%   Weight: 142 lb 6.4 oz (64.6 kg)   Height: 5\' 9"  (1.753 m)   PainSc: 0-No pain    Body mass index is 21.03 kg/m.  BP Readings from Last 3 Encounters:  07/30/22 (!) 146/70  07/14/22 (!) 172/76  06/11/22 (!) 150/60   Wt Readings  from Last 3 Encounters:  07/30/22 142 lb 6.4 oz (64.6 kg)  06/11/22 145 lb 3.2 oz (65.9 kg)  06/04/22 142 lb (64.4 kg)     The 10-year ASCVD risk score (Arnett DK, et al., 2019) is: 24%   Values used to calculate the score:     Age: 84 years     Sex: Male     Is Non-Hispanic African American: Yes     Diabetic: No     Tobacco smoker: No     Systolic Blood Pressure: 146 mmHg     Is BP treated: Yes     HDL Cholesterol: 80 mg/dL     Total Cholesterol: 155 mg/dL ++ Objective:  Physical Exam Vitals and nursing note reviewed.  Constitutional:      Appearance: Normal appearance.  HENT:     Head: Normocephalic and atraumatic.  Eyes:     Extraocular Movements: Extraocular movements intact.  Cardiovascular:     Rate and Rhythm: Normal rate and regular rhythm.     Heart sounds: Normal heart sounds.  Pulmonary:     Effort: Pulmonary effort is normal.     Breath sounds: Normal breath sounds.  Musculoskeletal:     Cervical back: Normal range of motion.  Skin:    General: Skin is warm.  Neurological:     General: No focal deficit present.     Mental Status: He is alert.  Psychiatric:        Mood and Affect: Mood normal.         Assessment And Plan:     1. Hypertensive heart disease without heart failure Comments: Uncontrolled, increase valsartan to TWO 160mg  tabs daily. He will rto in 2 weeks for nurse visit. I will recheck BMP at that time. - BMP8+EGFR - BMP8+EGFR; Future  2. Stenosis of left carotid artery Comments: Chronic, importance of compliance w statin therapy was d/w patient. He will c/w ASA 81mg  and atorvastatin 10mg  daily. LDL goal < 70.  3. Acquired hypothyroidism Comments: Chronic, thyroid function normal in April 2024.  No med changes. He does agree to bone density. - DG Bone Density; Future  4. Myalgia Comments: He is on statin therapy, possibly exacerbated by this. May benefit from Mg supplementation. He is encouraged to stay well hydrated. Check labs as  below. - CK, total  5. Iron deficiency anemia due to chronic blood loss Comments: Chronic, he has been evaluated by Hematology. I will check labs as below. - CBC - Iron, TIBC and Ferritin Panel - Vitamin B12  6. Tobacco use disorder  Comments: He has 22packyear h/o tobacco, quit less than 15 years ago.  Agrees to LDCT. - CT CHEST LUNG CA SCREEN LOW DOSE W/O CM; Future  7. Need for diphtheria-tetanus-pertussis (Tdap) vaccine Comments: He was given Tdap, billed via [facility]. - Tdap vaccine greater than or equal to 7yo IM    Return in about 2 weeks (around 08/13/2022), or bp check NV, for 3 months bp check.  Patient was given opportunity to ask questions. Patient verbalized understanding of the plan and was able to repeat key elements of the plan. All questions were answered to their satisfaction.   I, Gwynneth Aliment, MD, have reviewed all documentation for this visit. The documentation on 07/30/22 for the exam, diagnosis, procedures, and orders are all accurate and complete.   IF YOU HAVE BEEN REFERRED TO A SPECIALIST, IT MAY TAKE 1-2 WEEKS TO SCHEDULE/PROCESS THE REFERRAL. IF YOU HAVE NOT HEARD FROM US/SPECIALIST IN TWO WEEKS, PLEASE GIVE [place] A CALL AT [phone].   THE PATIENT IS ENCOURAGED TO PRACTICE SOCIAL DISTANCING DUE TO THE COVID-19 PANDEMIC.

## 2022-07-31 LAB — BMP8+EGFR
BUN/Creatinine Ratio: 9 — ABNORMAL LOW (ref 10–24)
BUN: 9 mg/dL (ref 8–27)
CO2: 20 mmol/L (ref 20–29)
Calcium: 9.8 mg/dL (ref 8.6–10.2)
Chloride: 105 mmol/L (ref 96–106)
Creatinine, Ser: 1.02 mg/dL (ref 0.76–1.27)
Glucose: 95 mg/dL (ref 70–99)
Potassium: 3.9 mmol/L (ref 3.5–5.2)
Sodium: 142 mmol/L (ref 134–144)
eGFR: 76 mL/min/{1.73_m2} (ref >59)

## 2022-07-31 LAB — IRON,TIBC AND FERRITIN PANEL
Ferritin: 36 ng/mL (ref 30–400)
Iron Saturation: 30 % (ref 15–55)
Iron: 111 ug/dL (ref 38–169)
Total Iron Binding Capacity: 374 ug/dL (ref 250–450)
UIBC: 263 ug/dL (ref 111–343)

## 2022-07-31 LAB — CBC
Hematocrit: 40.6 % (ref 37.5–51.0)
Hemoglobin: 12.6 g/dL — ABNORMAL LOW (ref 13.0–17.7)
MCH: 26.9 pg (ref 26.6–33.0)
MCHC: 31 g/dL — ABNORMAL LOW (ref 31.5–35.7)
MCV: 87 fL (ref 79–97)
Platelets: 230 10*3/uL (ref 150–450)
RBC: 4.68 x10E6/uL (ref 4.14–5.80)
RDW: 12.8 % (ref 11.6–15.4)
WBC: 5.1 10*3/uL (ref 3.4–10.8)

## 2022-07-31 LAB — CK: Total CK: 411 U/L — ABNORMAL HIGH (ref 41–331)

## 2022-07-31 LAB — VITAMIN B12: Vitamin B-12: 1102 pg/mL (ref 232–1245)

## 2022-08-04 ENCOUNTER — Other Ambulatory Visit: Payer: Self-pay | Admitting: Internal Medicine

## 2022-08-04 MED ORDER — ATORVASTATIN CALCIUM 10 MG PO TABS: ORAL_TABLET | ORAL | 11 refills | Status: DC

## 2022-08-10 DIAGNOSIS — I119 Hypertensive heart disease without heart failure: Secondary | ICD-10-CM | POA: Insufficient documentation

## 2022-08-10 DIAGNOSIS — F17200 Nicotine dependence, unspecified, uncomplicated: Secondary | ICD-10-CM | POA: Insufficient documentation

## 2022-08-12 ENCOUNTER — Ambulatory Visit: Payer: Medicare Other

## 2022-08-12 ENCOUNTER — Other Ambulatory Visit: Payer: Self-pay

## 2022-08-12 VITALS — BP 152/66 | HR 82 | Temp 98.5°F | Ht 69.0 in | Wt 142.0 lb

## 2022-08-12 DIAGNOSIS — I119 Hypertensive heart disease without heart failure: Secondary | ICD-10-CM

## 2022-08-12 DIAGNOSIS — I10 Essential (primary) hypertension: Secondary | ICD-10-CM

## 2022-08-12 MED ORDER — HYDRALAZINE HCL 25 MG PO TABS: 25.0000 mg | ORAL_TABLET | Freq: Three times a day (TID) | ORAL | 2 refills | Status: DC

## 2022-08-12 NOTE — Addendum Note (Signed)
Addended by: Marlyn Corporal on: 08/12/2022 12:08 PM   Modules accepted: Orders

## 2022-08-12 NOTE — Progress Notes (Signed)
Patient presents today for a BP check, patient currently taking amLODipine 10mg  PM, Hydralazine 25mg  AM-2x daily, Valsartan 160mg  AM. Patient is also getting lab work. BP Readings from Last 3 Encounters:  08/12/22 (!) 148/70  07/30/22 (!) 146/70  07/14/22 (!) 172/76   Per provier - increase hydralazine to TID - breakfast lunch and dinner f/u NV 2 weeks.

## 2022-08-13 ENCOUNTER — Other Ambulatory Visit: Payer: Self-pay | Admitting: Orthopaedic Surgery

## 2022-08-13 LAB — BMP8+EGFR
BUN/Creatinine Ratio: 9 — ABNORMAL LOW (ref 10–24)
BUN: 9 mg/dL (ref 8–27)
CO2: 19 mmol/L — ABNORMAL LOW (ref 20–29)
Calcium: 9.3 mg/dL (ref 8.6–10.2)
Chloride: 105 mmol/L (ref 96–106)
Creatinine, Ser: 1.02 mg/dL (ref 0.76–1.27)
Glucose: 89 mg/dL (ref 70–99)
Potassium: 4.1 mmol/L (ref 3.5–5.2)
Sodium: 141 mmol/L (ref 134–144)
eGFR: 76 mL/min/{1.73_m2} (ref >59)

## 2022-08-26 ENCOUNTER — Ambulatory Visit: Payer: Self-pay

## 2022-08-29 ENCOUNTER — Ambulatory Visit
Admission: RE | Admit: 2022-08-29 | Discharge: 2022-08-29 | Disposition: A | Discharge status: Home / Self Care | Payer: Medicare Other | Source: Ambulatory Visit | Attending: Internal Medicine | Admitting: Internal Medicine

## 2022-08-29 DIAGNOSIS — F17200 Nicotine dependence, unspecified, uncomplicated: Secondary | ICD-10-CM

## 2022-09-08 ENCOUNTER — Other Ambulatory Visit (INDEPENDENT_AMBULATORY_CARE_PROVIDER_SITE_OTHER): Payer: Medicare Other

## 2022-09-08 DIAGNOSIS — D500 Iron deficiency anemia secondary to blood loss (chronic): Secondary | ICD-10-CM | POA: Diagnosis not present

## 2022-09-08 LAB — HEMOCCULT GUIAC POC 1CARD (OFFICE): Fecal Occult Blood, POC: NEGATIVE

## 2022-09-13 ENCOUNTER — Other Ambulatory Visit: Payer: Self-pay | Admitting: Internal Medicine

## 2022-09-13 DIAGNOSIS — I10 Essential (primary) hypertension: Secondary | ICD-10-CM

## 2022-09-29 ENCOUNTER — Other Ambulatory Visit: Payer: Self-pay

## 2022-09-29 DIAGNOSIS — I10 Essential (primary) hypertension: Secondary | ICD-10-CM

## 2022-09-29 MED ORDER — VALSARTAN 160 MG PO TABS
160.0000 mg | ORAL_TABLET | Freq: Every day | ORAL | 1 refills | Status: DC
Start: 2022-09-29 — End: 2023-03-23

## 2022-09-29 MED ORDER — PANTOPRAZOLE SODIUM 40 MG PO TBEC: 40.0000 mg | DELAYED_RELEASE_TABLET | Freq: Every day | ORAL | 2 refills | Status: DC

## 2022-09-29 MED ORDER — HYDRALAZINE HCL 25 MG PO TABS: ORAL_TABLET | ORAL | 2 refills | Status: DC

## 2022-09-29 MED ORDER — AMLODIPINE BESYLATE 10 MG PO TABS: ORAL_TABLET | ORAL | 2 refills | Status: DC

## 2022-09-29 MED ORDER — ATORVASTATIN CALCIUM 10 MG PO TABS: ORAL_TABLET | ORAL | 11 refills | Status: DC

## 2022-10-29 NOTE — Patient Instructions (Signed)
Hypertension, Adult High blood pressure (hypertension) is when the force of blood pumping through the arteries is too strong. The arteries are the blood vessels that carry blood from the heart throughout the body. Hypertension forces the heart to work harder to pump blood and may cause arteries to become narrow or stiff. Untreated or uncontrolled hypertension can lead to a heart attack, heart failure, a stroke, kidney disease, and other problems. A blood pressure reading consists of a higher number over a lower number. Ideally, your blood pressure should be below 120/80. The first ("top") number is called the systolic pressure. It is a measure of the pressure in your arteries as your heart beats. The second ("bottom") number is called the diastolic pressure. It is a measure of the pressure in your arteries as the heart relaxes. What are the causes? The exact cause of this condition is not known. There are some conditions that result in high blood pressure. What increases the risk? Certain factors may make you more likely to develop high blood pressure. Some of these risk factors are under your control, including: Smoking. Not getting enough exercise or physical activity. Being overweight. Having too much fat, sugar, calories, or salt (sodium) in your diet. Drinking too much alcohol. Other risk factors include: Having a personal history of heart disease, diabetes, high cholesterol, or kidney disease. Stress. Having a family history of high blood pressure and high cholesterol. Having obstructive sleep apnea. Age. The risk increases with age. What are the signs or symptoms? High blood pressure may not cause symptoms. Very high blood pressure (hypertensive crisis) may cause: Headache. Fast or irregular heartbeats (palpitations). Shortness of breath. Nosebleed. Nausea and vomiting. Vision changes. Severe chest pain, dizziness, and seizures. How is this diagnosed? This condition is diagnosed by  measuring your blood pressure while you are seated, with your arm resting on a flat surface, your legs uncrossed, and your feet flat on the floor. The cuff of the blood pressure monitor will be placed directly against the skin of your upper arm at the level of your heart. Blood pressure should be measured at least twice using the same arm. Certain conditions can cause a difference in blood pressure between your right and left arms. If you have a high blood pressure reading during one visit or you have normal blood pressure with other risk factors, you may be asked to: Return on a different day to have your blood pressure checked again. Monitor your blood pressure at home for 1 week or longer. If you are diagnosed with hypertension, you may have other blood or imaging tests to help your health care provider understand your overall risk for other conditions. How is this treated? This condition is treated by making healthy lifestyle changes, such as eating healthy foods, exercising more, and reducing your alcohol intake. You may be referred for counseling on a healthy diet and physical activity. Your health care provider may prescribe medicine if lifestyle changes are not enough to get your blood pressure under control and if: Your systolic blood pressure is above 130. Your diastolic blood pressure is above 80. Your personal target blood pressure may vary depending on your medical conditions, your age, and other factors. Follow these instructions at home: Eating and drinking  Eat a diet that is high in fiber and potassium, and low in sodium, added sugar, and fat. An example of this eating plan is called the DASH diet. DASH stands for Dietary Approaches to Stop Hypertension. To eat this way: Eat   plenty of fresh fruits and vegetables. Try to fill one half of your plate at each meal with fruits and vegetables. Eat whole grains, such as whole-wheat pasta, brown rice, or whole-grain bread. Fill about one  fourth of your plate with whole grains. Eat or drink low-fat dairy products, such as skim milk or low-fat yogurt. Avoid fatty cuts of meat, processed or cured meats, and poultry with skin. Fill about one fourth of your plate with lean proteins, such as fish, chicken without skin, beans, eggs, or tofu. Avoid pre-made and processed foods. These tend to be higher in sodium, added sugar, and fat. Reduce your daily sodium intake. Many people with hypertension should eat less than 1,500 mg of sodium a day. Do not drink alcohol if: Your health care provider tells you not to drink. You are pregnant, may be pregnant, or are planning to become pregnant. If you drink alcohol: Limit how much you have to: 0-1 drink a day for women. 0-2 drinks a day for men. Know how much alcohol is in your drink. In the [place] one drink equals one 12 oz bottle of beer (355 mL), one 5 oz glass of wine (148 mL), or one 1 oz glass of hard liquor (44 mL). Lifestyle  Work with your health care provider to maintain a healthy body weight or to lose weight. Ask what an ideal weight is for you. Get at least 30 minutes of exercise that causes your heart to beat faster (aerobic exercise) most days of the week. Activities may include walking, swimming, or biking. Include exercise to strengthen your muscles (resistance exercise), such as Pilates or lifting weights, as part of your weekly exercise routine. Try to do these types of exercises for 30 minutes at least 3 days a week. Do not use any products that contain nicotine or tobacco. These products include cigarettes, chewing tobacco, and vaping devices, such as e-cigarettes. If you need help quitting, ask your health care provider. Monitor your blood pressure at home as told by your health care provider. Keep all follow-up visits. This is important. Medicines Take over-the-counter and prescription medicines only as told by your health care provider. Follow directions carefully. Blood  pressure medicines must be taken as prescribed. Do not skip doses of blood pressure medicine. Doing this puts you at risk for problems and can make the medicine less effective. Ask your health care provider about side effects or reactions to medicines that you should watch for. Contact a health care provider if you: Think you are having a reaction to a medicine you are taking. Have headaches that keep coming back (recurring). Feel dizzy. Have swelling in your ankles. Have trouble with your vision. Get help right away if you: Develop a severe headache or confusion. Have unusual weakness or numbness. Feel faint. Have severe pain in your chest or abdomen. Vomit repeatedly. Have trouble breathing. These symptoms may be an emergency. Get help right away. Call 911. Do not wait to see if the symptoms will go away. Do not drive yourself to the hospital. Summary Hypertension is when the force of blood pumping through your arteries is too strong. If this condition is not controlled, it may put you at risk for serious complications. Your personal target blood pressure may vary depending on your medical conditions, your age, and other factors. For most people, a normal blood pressure is less than 120/80. Hypertension is treated with lifestyle changes, medicines, or a combination of both. Lifestyle changes include losing weight, eating a healthy,   low-sodium diet, exercising more, and limiting alcohol. This information is not intended to replace advice given to you by your health care provider. Make sure you discuss any questions you have with your health care provider. Document Revised: 12/25/2020 Document Reviewed: 12/25/2020 Elsevier Patient Education  2024 Elsevier Inc.  

## 2022-11-01 ENCOUNTER — Other Ambulatory Visit: Payer: Self-pay | Admitting: Internal Medicine

## 2022-11-01 ENCOUNTER — Other Ambulatory Visit: Payer: Self-pay | Admitting: Hematology

## 2022-11-03 ENCOUNTER — Encounter: Payer: Self-pay | Admitting: Hematology

## 2022-11-04 ENCOUNTER — Ambulatory Visit (INDEPENDENT_AMBULATORY_CARE_PROVIDER_SITE_OTHER): Payer: Medicare Other | Admitting: Internal Medicine

## 2022-11-04 ENCOUNTER — Encounter: Payer: Self-pay | Admitting: Internal Medicine

## 2022-11-04 VITALS — BP 130/60 | HR 83 | Ht 69.0 in | Wt 142.2 lb

## 2022-11-04 DIAGNOSIS — Z6821 Body mass index (BMI) 21.0-21.9, adult: Secondary | ICD-10-CM | POA: Diagnosis not present

## 2022-11-04 DIAGNOSIS — M791 Myalgia, unspecified site: Secondary | ICD-10-CM

## 2022-11-04 DIAGNOSIS — E039 Hypothyroidism, unspecified: Secondary | ICD-10-CM | POA: Diagnosis not present

## 2022-11-04 DIAGNOSIS — I119 Hypertensive heart disease without heart failure: Secondary | ICD-10-CM

## 2022-11-04 DIAGNOSIS — F17200 Nicotine dependence, unspecified, uncomplicated: Secondary | ICD-10-CM

## 2022-11-04 DIAGNOSIS — I6522 Occlusion and stenosis of left carotid artery: Secondary | ICD-10-CM | POA: Diagnosis not present

## 2022-11-04 DIAGNOSIS — D500 Iron deficiency anemia secondary to blood loss (chronic): Secondary | ICD-10-CM

## 2022-11-04 MED ORDER — SERTRALINE HCL 25 MG PO TABS: 25.0000 mg | ORAL_TABLET | Freq: Every day | ORAL | 2 refills | Status: DC

## 2022-11-04 MED ORDER — LEVOTHYROXINE SODIUM 100 MCG PO TABS: 100.0000 ug | ORAL_TABLET | Freq: Every day | ORAL | Status: DC

## 2022-11-04 NOTE — Progress Notes (Signed)
I,Shawn Fernandez, CMA,acting as a Neurosurgeon for Shawn Aliment, MD.,have documented all relevant documentation on the behalf of Shawn Aliment, MD,as directed by  Shawn Aliment, MD while in the presence of Shawn Aliment, MD.  Subjective:  Patient ID: Shawn Fernandez , male    DOB: 06-12-1945 , 77 y.o.   MRN: [id number]  Chief Complaint  Patient presents with   Hypertension   Hypothyroidism    HPI  He presents today for a bp and thyroid follow up. Patient is compliant with his meds. He denies having any headaches, chest pain and shortness of breath. He reports no specific questions or concerns.   He reports he will receive flu shot with his wife, Shawn Fernandez.     Hypertension This is a chronic problem. The current episode started more than 1 year ago. The problem has been gradually improving since onset. The problem is controlled. Pertinent negatives include no blurred vision. Risk factors for coronary artery disease include male gender and dyslipidemia.     Past Medical History:  Diagnosis Date   Blood clot in abdominal vein    treated at [facility]   Carotid artery disease (HCC)    Dyslipidemia    FHx: heart disease    History of blood transfusion 8-9 yrs ago   HTN (hypertension)    Peripheral vascular disease (HCC)      Family History  Problem Relation Age of Onset   Heart disease Brother    Heart disease Mother    Cancer Father        ? type     Current Outpatient Medications:    albuterol (VENTOLIN HFA) 108 (90 Base) MCG/ACT inhaler, TAKE 2 PUFFS BY MOUTH EVERY 6 HOURS AS NEEDED FOR WHEEZE OR SHORTNESS OF BREATH, Disp: 8.5 each, Rfl: 1   amLODipine (NORVASC) 10 MG tablet, TAKE 1 TABLET BY MOUTH EVERYDAY AT BEDTIME, Disp: 90 tablet, Rfl: 2   aspirin EC 81 MG tablet, Take 81 mg by mouth daily., Disp: , Rfl:    atorvastatin (LIPITOR) 10 MG tablet, One tab po daily, except Sundays Start 6/10, Disp: 30 tablet, Rfl: 11   Cholecalciferol (VITAMIN D3) 1000 units  CAPS, Take 1 capsule by mouth daily., Disp: , Rfl:    diclofenac (VOLTAREN) 75 MG EC tablet, Take 1 tablet (75 mg total) by mouth 2 (two) times daily., Disp: 30 tablet, Rfl: 2   hydrALAZINE (APRESOLINE) 25 MG tablet, ONE TABLET BY MOUTH AT BREAKFAST LUNCH AND BEDTIME., Disp: 270 tablet, Rfl: 2   iron polysaccharides (NIFEREX) 150 MG capsule, TAKE 1 CAPSULE BY MOUTH EVERY DAY, Disp: 90 capsule, Rfl: 1   Multiple Vitamin (MULTIVITAMIN WITH MINERALS) TABS tablet, Take 1 tablet by mouth daily., Disp: , Rfl:    pantoprazole (PROTONIX) 40 MG tablet, Take 1 tablet (40 mg total) by mouth daily., Disp: 90 tablet, Rfl: 2   traMADol (ULTRAM) 50 MG tablet, Take 1 tablet (50 mg total) by mouth every 6 (six) hours as needed (pain)., Disp: 12 tablet, Rfl: 0   valsartan (DIOVAN) 160 MG tablet, Take 1 tablet (160 mg total) by mouth daily., Disp: 90 tablet, Rfl: 1   fluticasone (FLONASE) 50 MCG/ACT nasal spray, Place 1 spray into both nostrils daily for 3 days., Disp: 16 g, Rfl: 0   levothyroxine (SYNTHROID) 100 MCG tablet, Take 1 tablet (100 mcg total) by mouth daily., Disp: , Rfl:    sertraline (ZOLOFT) 25 MG tablet, Take 1 tablet (25 mg total) by  mouth daily., Disp: 90 tablet, Rfl: 2   Allergies  Allergen Reactions   Lisinopril Cough     Review of Systems  Constitutional: Negative.   HENT: Negative.    Eyes: Negative.  Negative for blurred vision.  Respiratory: Negative.    Cardiovascular: Negative.   Skin: Negative.      Today's Vitals   11/04/22 1058  BP: 130/60  Pulse: 83  SpO2: 99%  Weight: 142 lb 3.2 oz (64.5 kg)  Height: 5\' 9"  (1.753 m)  PainSc: 0-No pain   Body mass index is 21 kg/m.  Wt Readings from Last 3 Encounters:  11/11/22 142 lb (64.4 kg)  11/04/22 142 lb 3.2 oz (64.5 kg)  08/12/22 142 lb (64.4 kg)    BP Readings from Last 3 Encounters:  11/11/22 134/62  11/04/22 130/60  08/12/22 (!) 152/66     Objective:  Physical Exam Vitals and nursing note reviewed.   Constitutional:      Appearance: Normal appearance.  HENT:     Head: Normocephalic and atraumatic.  Eyes:     Extraocular Movements: Extraocular movements intact.  Cardiovascular:     Rate and Rhythm: Normal rate and regular rhythm.     Heart sounds: Normal heart sounds.  Pulmonary:     Effort: Pulmonary effort is normal.     Breath sounds: Normal breath sounds.  Musculoskeletal:     Cervical back: Normal range of motion.  Skin:    General: Skin is warm.  Neurological:     General: No focal deficit present.     Mental Status: He is alert.  Psychiatric:        Mood and Affect: Mood normal.         Assessment And Plan:  Hypertensive heart disease without heart failure Assessment & Plan: Chronic, improved with use of amlodipine 10mg  daily, hydralazine 25mg  tid and valsartan 320mg  daily.   Orders: -     CMP14+EGFR -     Lipid panel  Acquired hypothyroidism Assessment & Plan: Chronic, currently taking levothyroxine daily. I will check thyroid panel and adjust meds as needed.   Orders: -     TSH + free T4  Stenosis of left carotid artery Assessment & Plan: Chronic, on statin therapy. Previously followed by Dr. Allyson Fernandez; however, appears last visit was in 2020.   Orders: -     VAS US CAROTID; Future  Body mass index (BMI) of 21.0-21.9 in adult  Other orders -     Sertraline HCl; Take 1 tablet (25 mg total) by mouth daily.  Dispense: 90 tablet; Refill: 2 -     Levothyroxine Sodium; Take 1 tablet (100 mcg total) by mouth daily.     Return if symptoms worsen or fail to improve.  Patient was given opportunity to ask questions. Patient verbalized understanding of the plan and was able to repeat key elements of the plan. All questions were answered to their satisfaction.    I, Shawn Aliment, MD, have reviewed all documentation for this visit. The documentation on 11/04/22 for the exam, diagnosis, procedures, and orders are all accurate and complete.   IF YOU  HAVE BEEN REFERRED TO A SPECIALIST, IT MAY TAKE 1-2 WEEKS TO SCHEDULE/PROCESS THE REFERRAL. IF YOU HAVE NOT HEARD FROM US/SPECIALIST IN TWO WEEKS, PLEASE GIVE [place] A CALL AT [phone].   THE PATIENT IS ENCOURAGED TO PRACTICE SOCIAL DISTANCING DUE TO THE COVID-19 PANDEMIC.

## 2022-11-05 LAB — CMP14+EGFR
ALT: 8 IU/L (ref 0–44)
AST: 23 IU/L (ref 0–40)
Albumin: 4.5 g/dL (ref 3.8–4.8)
Alkaline Phosphatase: 125 IU/L — ABNORMAL HIGH (ref 44–121)
BUN/Creatinine Ratio: 13 (ref 10–24)
BUN: 14 mg/dL (ref 8–27)
Bilirubin Total: 0.5 mg/dL (ref 0.0–1.2)
CO2: 21 mmol/L (ref 20–29)
Calcium: 10.1 mg/dL (ref 8.6–10.2)
Chloride: 105 mmol/L (ref 96–106)
Creatinine, Ser: 1.06 mg/dL (ref 0.76–1.27)
Globulin, Total: 2.9 g/dL (ref 1.5–4.5)
Glucose: 102 mg/dL — ABNORMAL HIGH (ref 70–99)
Potassium: 4 mmol/L (ref 3.5–5.2)
Sodium: 141 mmol/L (ref 134–144)
Total Protein: 7.4 g/dL (ref 6.0–8.5)
eGFR: 72 mL/min/{1.73_m2} (ref >59)

## 2022-11-05 LAB — LIPID PANEL
Chol/HDL Ratio: 1.9 ratio (ref 0.0–5.0)
Cholesterol, Total: 166 mg/dL (ref 100–199)
HDL: 86 mg/dL (ref >39)
LDL Chol Calc (NIH): 67 mg/dL (ref 0–99)
Triglycerides: 64 mg/dL (ref 0–149)
VLDL Cholesterol Cal: 13 mg/dL (ref 5–40)

## 2022-11-05 LAB — TSH+FREE T4
Free T4: 1.54 ng/dL (ref 0.82–1.77)
TSH: 1.4 u[IU]/mL (ref 0.450–4.500)

## 2022-11-11 ENCOUNTER — Ambulatory Visit (INDEPENDENT_AMBULATORY_CARE_PROVIDER_SITE_OTHER): Payer: Medicare Other

## 2022-11-11 VITALS — BP 134/62 | HR 85 | Temp 98.6°F | Ht 69.0 in | Wt 142.0 lb

## 2022-11-11 DIAGNOSIS — Z23 Encounter for immunization: Secondary | ICD-10-CM | POA: Diagnosis not present

## 2022-11-11 NOTE — Progress Notes (Signed)
Patient presents today for flu shot.  

## 2022-11-22 NOTE — Assessment & Plan Note (Signed)
Chronic, currently taking levothyroxine daily. I will check thyroid panel and adjust meds as needed.

## 2022-11-22 NOTE — Assessment & Plan Note (Signed)
Chronic, improved with use of amlodipine 10mg  daily, hydralazine 25mg  tid and valsartan 320mg  daily.

## 2022-11-22 NOTE — Assessment & Plan Note (Signed)
Chronic, on statin therapy. Previously followed by Dr. Allyson Sabal; however, appears last visit was in 2020.

## 2022-12-01 ENCOUNTER — Ambulatory Visit (HOSPITAL_COMMUNITY)
Admission: RE | Admit: 2022-12-01 | Discharge: 2022-12-01 | Disposition: A | Discharge status: Home / Self Care | Payer: Medicare Other | Source: Ambulatory Visit | Attending: Internal Medicine | Admitting: Internal Medicine

## 2022-12-01 DIAGNOSIS — I6522 Occlusion and stenosis of left carotid artery: Secondary | ICD-10-CM | POA: Diagnosis not present

## 2022-12-02 ENCOUNTER — Other Ambulatory Visit (INDEPENDENT_AMBULATORY_CARE_PROVIDER_SITE_OTHER): Payer: Medicare Other

## 2022-12-02 DIAGNOSIS — D500 Iron deficiency anemia secondary to blood loss (chronic): Secondary | ICD-10-CM | POA: Diagnosis not present

## 2022-12-02 LAB — HEMOCCULT GUIAC POC 1CARD (OFFICE)
Card #2 Fecal Occult Blod, POC: POSITIVE
Card #3 Fecal Occult Blood, POC: POSITIVE
Fecal Occult Blood, POC: POSITIVE — AB

## 2022-12-17 ENCOUNTER — Other Ambulatory Visit: Payer: Self-pay | Admitting: Urology

## 2022-12-17 DIAGNOSIS — R972 Elevated prostate specific antigen [PSA]: Secondary | ICD-10-CM

## 2022-12-23 ENCOUNTER — Ambulatory Visit: Payer: Medicare Other

## 2023-01-01 ENCOUNTER — Other Ambulatory Visit: Payer: Self-pay

## 2023-01-01 MED ORDER — POLYSACCHARIDE IRON COMPLEX 150 MG PO CAPS: 150.0000 mg | ORAL_CAPSULE | Freq: Every day | ORAL | 2 refills | Status: DC

## 2023-01-03 ENCOUNTER — Other Ambulatory Visit: Payer: Self-pay | Admitting: Internal Medicine

## 2023-01-06 ENCOUNTER — Ambulatory Visit: Payer: Medicare Other

## 2023-01-06 NOTE — Progress Notes (Deleted)
Patient presents today for his covid shot. Patient waited allotted time after covid shot.

## 2023-01-07 ENCOUNTER — Other Ambulatory Visit: Payer: Self-pay

## 2023-01-07 MED ORDER — SERTRALINE HCL 25 MG PO TABS: 25.0000 mg | ORAL_TABLET | Freq: Every day | ORAL | 2 refills | Status: DC

## 2023-01-07 MED ORDER — AMLODIPINE BESYLATE 10 MG PO TABS: ORAL_TABLET | ORAL | 2 refills | Status: DC

## 2023-01-13 ENCOUNTER — Encounter: Payer: Self-pay | Admitting: Emergency Medicine

## 2023-01-13 ENCOUNTER — Ambulatory Visit
Admission: EM | Admit: 2023-01-13 | Discharge: 2023-01-13 | Disposition: A | Discharge status: Home / Self Care | Payer: Medicare Other | Attending: Family | Admitting: Family

## 2023-01-13 ENCOUNTER — Other Ambulatory Visit: Payer: Self-pay

## 2023-01-13 DIAGNOSIS — R051 Acute cough: Secondary | ICD-10-CM | POA: Diagnosis not present

## 2023-01-13 DIAGNOSIS — J0100 Acute maxillary sinusitis, unspecified: Secondary | ICD-10-CM | POA: Diagnosis not present

## 2023-01-13 MED ORDER — GUAIFENESIN-CODEINE 100-10 MG/5ML PO SOLN: 5.0000 mL | Freq: Every evening | ORAL | 0 refills | Status: DC | PRN

## 2023-01-13 MED ORDER — AMOXICILLIN-POT CLAVULANATE 875-125 MG PO TABS: 1.0000 | ORAL_TABLET | Freq: Two times a day (BID) | ORAL | 0 refills | Status: AC

## 2023-01-13 NOTE — ED Triage Notes (Signed)
Pt here for cough x 2 weeks that is productive with white sputum

## 2023-01-13 NOTE — ED Provider Notes (Signed)
[name]    CSN: [id number] Arrival date & time: 01/13/23  1148      History   Chief Complaint Chief Complaint  Patient presents with   Cough    HPI Shawn Fernandez is a 77 y.o. male.   77 year old male presents with cough and sinus drainage for over 2 weeks.   The history is provided by the patient.  Cough   Past Medical History:  Diagnosis Date   Blood clot in abdominal vein    treated at [facility]   Carotid artery disease (HCC)    Dyslipidemia    FHx: heart disease    History of blood transfusion 8-9 yrs ago   HTN (hypertension)    Peripheral vascular disease (HCC)     Patient Active Problem List   Diagnosis Date Noted   Tobacco use disorder 08/10/2022   Hypertensive heart disease without heart failure 08/10/2022   BMI 22.0-22.9, adult 08/17/2021   Acquired hypothyroidism 11/21/2017   Iron deficiency anemia due to chronic blood loss 03/29/2015   Anemia 03/15/2015   Hyperthyroidism 01/04/2015   Myalgia 06/29/2014   Essential hypertension 10/27/2012   Hyperlipidemia 10/27/2012   Peripheral arterial disease (HCC) 10/27/2012   Carotid artery disease (HCC) 10/27/2012   Cough 11/06/2011    Past Surgical History:  Procedure Laterality Date   ANKLE SURGERY Right yrs ago   arm surgery Left yrs ago   2 rods inserted, 1 rod later removed   COLONOSCOPY WITH PROPOFOL N/A 09/16/2013   Procedure: COLONOSCOPY WITH PROPOFOL;  Surgeon: Theda Belfast, MD;  Location: [facility] ENDOSCOPY;  Service: Endoscopy;  Laterality: N/A;   LOWER EXTREMITY ANGIOGRAM N/A 07/24/2011   Procedure: LOWER EXTREMITY ANGIOGRAM;  Surgeon: Runell Gess, MD;  Location: [facility] CATH LAB;  Service: Cardiovascular;  Laterality: N/A;   stent in abdominal vein  4-5 yrs ago   US ECHOCARDIOGRAPHY  07-20-2007   EF 55-60%       Home Medications    Prior to Admission medications   Medication Sig Start Date End Date Taking? Authorizing Provider  amoxicillin-clavulanate (AUGMENTIN)  875-125 MG tablet Take 1 tablet by mouth every 12 (twelve) hours for 7 days. 01/13/23 01/20/23 Yes Sonora Catlin, Ali Lowe, NP  guaiFENesin-codeine 100-10 MG/5ML syrup Take 5 mLs by mouth at bedtime as needed for cough. 01/13/23  Yes Sudie Grumbling, NP  albuterol (VENTOLIN HFA) 108 (90 Base) MCG/ACT inhaler TAKE 2 PUFFS BY MOUTH EVERY 6 HOURS AS NEEDED FOR WHEEZE OR SHORTNESS OF BREATH 07/24/20   Ghumman, Ramandeep, NP  amLODipine (NORVASC) 10 MG tablet TAKE 1 TABLET BY MOUTH EVERYDAY AT BEDTIME 01/07/23   Dorothyann Peng, MD  aspirin EC 81 MG tablet Take 81 mg by mouth daily.    [provider]  atorvastatin (LIPITOR) 10 MG tablet One tab po daily, except Sundays Start 6/10 09/29/22   Dorothyann Peng, MD  Cholecalciferol (VITAMIN D3) 1000 units CAPS Take 1 capsule by mouth daily.    [provider]  diclofenac (VOLTAREN) 75 MG EC tablet Take 1 tablet (75 mg total) by mouth 2 (two) times daily. 06/19/22   Tarry Kos, MD  fluticasone (FLONASE) 50 MCG/ACT nasal spray Place 1 spray into both nostrils daily for 3 days. 12/11/21 06/11/22  Gustavus Bryant, FNP  hydrALAZINE (APRESOLINE) 25 MG tablet ONE TABLET BY MOUTH AT BREAKFAST LUNCH AND BEDTIME. 09/29/22   Dorothyann Peng, MD  iron polysaccharides (NIFEREX) 150 MG capsule Take 1 capsule (150 mg total) by  mouth daily. 01/01/23   Dorothyann Peng, MD  levothyroxine (SYNTHROID) 100 MCG tablet Take 1 tablet (100 mcg total) by mouth daily. 11/04/22   Dorothyann Peng, MD  Multiple Vitamin (MULTIVITAMIN WITH MINERALS) TABS tablet Take 1 tablet by mouth daily.    [provider]  pantoprazole (PROTONIX) 40 MG tablet Take 1 tablet (40 mg total) by mouth daily. 09/29/22   Dorothyann Peng, MD  sertraline (ZOLOFT) 25 MG tablet Take 1 tablet (25 mg total) by mouth daily. 01/07/23 01/07/24  Dorothyann Peng, MD  traMADol (ULTRAM) 50 MG tablet Take 1 tablet (50 mg total) by mouth every 6 (six) hours as needed (pain). 07/14/22   Zenia Resides, MD  valsartan  (DIOVAN) 160 MG tablet Take 1 tablet (160 mg total) by mouth daily. 09/29/22   Dorothyann Peng, MD    Family History Family History  Problem Relation Age of Onset   Heart disease Brother    Heart disease Mother    Cancer Father        ? type    Social History Social History   Tobacco Use   Smoking status: Former    Current packs/day: 0.00    Average packs/day: 0.8 packs/day for 47.0 years (35.2 ttl pk-yrs)    Types: Cigarettes    Start date: 07/07/1962    Quit date: 07/01/2009    Years since quitting: 13.5   Smokeless tobacco: Never   Tobacco comments:    0.75pack x 30 years, quit in 2011, resumed smoking in 2012, one pack lasts a week  Vaping Use   Vaping status: Never Used  Substance Use Topics   Alcohol use: Not Currently    Comment: only occ   Drug use: No     Allergies   Lisinopril   Review of Systems Review of Systems  Respiratory:  Positive for cough.      Physical Exam Triage Vital Signs ED Triage Vitals  Encounter Vitals Group     BP 01/13/23 1350 (!) 172/71     Systolic BP Percentile --      Diastolic BP Percentile --      Pulse Rate 01/13/23 1350 (!) 57     Resp 01/13/23 1350 18     Temp 01/13/23 1350 97.7 F (36.5 C)     Temp Source 01/13/23 1350 Oral     SpO2 01/13/23 1350 95 %     Weight --      Height --      Head Circumference --      Peak Flow --      Pain Score 01/13/23 1351 0     Pain Loc --      Pain Education --      Exclude from Growth Chart --    No data found.  Updated Vital Signs BP (!) 172/71 (BP Location: Left Arm)   Pulse (!) 57   Temp 97.7 F (36.5 C) (Oral)   Resp 18   SpO2 95%   Visual Acuity Right Eye Distance:   Left Eye Distance:   Bilateral Distance:    Right Eye Near:   Left Eye Near:    Bilateral Near:     Physical Exam HENT:     Head: Normocephalic and atraumatic.     Nose: Congestion present.     Mouth/Throat:     Pharynx: Postnasal drip present.  Cardiovascular:     Rate and Rhythm: Normal  rate.  Pulmonary:     Effort: No respiratory distress.  Breath sounds: No rhonchi.      UC Treatments / Results  Labs (all labs ordered are listed, but only abnormal results are displayed) Labs Reviewed - No data to display  EKG   Radiology No results found.  Procedures Procedures (including critical care time)  Medications Ordered in UC Medications - No data to display  Initial Impression / Assessment and Plan / UC Course  I have reviewed the triage vital signs and the nursing notes.  Pertinent labs & imaging results that were available during my care of the patient were reviewed by me and considered in my medical decision making (see chart for details).     *** Final Clinical Impressions(s) / UC Diagnoses   Final diagnoses:  Acute non-recurrent maxillary sinusitis  Acute cough     Discharge Instructions      Recommend start Augmentin 875mg  twice a day for 7 days- take with food. Continue to increase fluids to help loosen up mucus in sinuses and chest. May use Guiafenesin with codeine syrup 1 teaspoon (5ml) at bedtime to help with cough. Follow-up in 3 to 4 days with your PCP if not improving.     ED Prescriptions     Medication Sig Dispense Auth. Provider   amoxicillin-clavulanate (AUGMENTIN) 875-125 MG tablet Take 1 tablet by mouth every 12 (twelve) hours for 7 days. 14 tablet Nicolus Ose, Ali Lowe, NP   guaiFENesin-codeine 100-10 MG/5ML syrup Take 5 mLs by mouth at bedtime as needed for cough. 40 mL Sudie Grumbling, NP      I have reviewed the PDMP during this encounter.

## 2023-01-13 NOTE — Discharge Instructions (Signed)
Recommend start Augmentin 875mg  twice a day for 7 days- take with food. Continue to increase fluids to help loosen up mucus in sinuses and chest. May use Guiafenesin with codeine syrup 1 teaspoon (5ml) at bedtime to help with cough. Follow-up in 3 to 4 days with your PCP if not improving.

## 2023-02-05 ENCOUNTER — Encounter: Payer: Self-pay | Admitting: Internal Medicine

## 2023-02-05 ENCOUNTER — Ambulatory Visit: Payer: Medicare Other | Admitting: Internal Medicine

## 2023-02-05 VITALS — BP 126/82 | HR 67 | Temp 97.7°F | Ht 69.0 in | Wt 141.4 lb

## 2023-02-05 DIAGNOSIS — I6522 Occlusion and stenosis of left carotid artery: Secondary | ICD-10-CM | POA: Diagnosis not present

## 2023-02-05 DIAGNOSIS — F17200 Nicotine dependence, unspecified, uncomplicated: Secondary | ICD-10-CM

## 2023-02-05 DIAGNOSIS — I119 Hypertensive heart disease without heart failure: Secondary | ICD-10-CM

## 2023-02-05 DIAGNOSIS — Z0000 Encounter for general adult medical examination without abnormal findings: Secondary | ICD-10-CM | POA: Diagnosis not present

## 2023-02-05 DIAGNOSIS — D649 Anemia, unspecified: Secondary | ICD-10-CM | POA: Diagnosis not present

## 2023-02-05 DIAGNOSIS — Z23 Encounter for immunization: Secondary | ICD-10-CM | POA: Diagnosis not present

## 2023-02-05 DIAGNOSIS — E039 Hypothyroidism, unspecified: Secondary | ICD-10-CM

## 2023-02-05 DIAGNOSIS — I270 Primary pulmonary hypertension: Secondary | ICD-10-CM

## 2023-02-05 DIAGNOSIS — D500 Iron deficiency anemia secondary to blood loss (chronic): Secondary | ICD-10-CM

## 2023-02-05 LAB — POCT URINALYSIS DIPSTICK
Bilirubin, UA: NEGATIVE
Blood, UA: NEGATIVE
Glucose, UA: NEGATIVE
Ketones, UA: NEGATIVE
Leukocytes, UA: NEGATIVE
Nitrite, UA: NEGATIVE
Protein, UA: NEGATIVE
Spec Grav, UA: 1.015 (ref 1.010–1.025)
Urobilinogen, UA: 0.2 U/dL
pH, UA: 5.5 (ref 5.0–8.0)

## 2023-02-05 NOTE — Assessment & Plan Note (Addendum)
A full exam was performed.  DRE deferred.  He is advised to get 30-45 minutes of regular exercise, no less than four to five days per week. Both weight-bearing and aerobic exercises are recommended.  She is advised to follow a healthy diet with at least six fruits/veggies per day, decrease intake of red meat and other saturated fats and to increase fish intake to twice weekly.  Meats/fish should not be fried -- baked, boiled or broiled is preferable. It is also important to cut back on your sugar intake.  Be sure to read labels - try to avoid anything with added sugar, high fructose corn syrup or other sweeteners.  If you must use a sweetener, you can try stevia or monkfruit.  It is also important to avoid artificially sweetened foods/beverages and diet drinks. Lastly, wear SPF 50 sunscreen on exposed skin and when in direct sunlight for an extended period of time.  Be sure to avoid fast food restaurants and aim for at least 60 ounces of water daily.

## 2023-02-05 NOTE — Progress Notes (Signed)
I,Victoria T Deloria Lair, CMA,acting as a Neurosurgeon for Gwynneth Aliment, MD.,have documented all relevant documentation on the behalf of Gwynneth Aliment, MD,as directed by  Gwynneth Aliment, MD while in the presence of Gwynneth Aliment, MD.  Subjective:   Patient ID: Shawn Fernandez , male    DOB: 1945/06/22 , 77 y.o.   MRN: [id number]  Chief Complaint  Patient presents with   Annual Exam   Hypertension   Hypothyroidism    HPI  He is here today for a full physical exam. He has no specific concerns or complaints at this time. He reports compliance with meds.  He denies having any headaches, chest pain and shortness of breath. He is accompanied by his wife today.  She has a concern about unintentional weight loss.  He states he has a good appetite. She disagrees. She states he stays in the bed for long amounts of time. She has to persuade him to get up. He does admit that his home situation is stressful because he lives with his SIL who tends to cause discord.      Hypertension This is a chronic problem. The current episode started more than 1 year ago. The problem has been gradually improving since onset. The problem is controlled. Pertinent negatives include no blurred vision, chest pain, palpitations or shortness of breath. Risk factors for coronary artery disease include smoking/tobacco exposure, sedentary lifestyle and male gender. The current treatment provides moderate improvement. Compliance problems include exercise.      Past Medical History:  Diagnosis Date   Blood clot in abdominal vein    treated at [facility]   Carotid artery disease (HCC)    Dyslipidemia    FHx: heart disease    History of blood transfusion 8-9 yrs ago   HTN (hypertension)    Peripheral vascular disease (HCC)      Family History  Problem Relation Age of Onset   Heart disease Brother    Heart disease Mother    Cancer Father        ? type     Current Outpatient Medications:    albuterol (VENTOLIN HFA) 108  (90 Base) MCG/ACT inhaler, TAKE 2 PUFFS BY MOUTH EVERY 6 HOURS AS NEEDED FOR WHEEZE OR SHORTNESS OF BREATH, Disp: 8.5 each, Rfl: 1   amLODipine (NORVASC) 10 MG tablet, TAKE 1 TABLET BY MOUTH EVERYDAY AT BEDTIME, Disp: 90 tablet, Rfl: 2   aspirin EC 81 MG tablet, Take 81 mg by mouth daily., Disp: , Rfl:    atorvastatin (LIPITOR) 10 MG tablet, One tab po daily, except Sundays Start 6/10, Disp: 30 tablet, Rfl: 11   Cholecalciferol (VITAMIN D3) 1000 units CAPS, Take 1 capsule by mouth daily., Disp: , Rfl:    diclofenac (VOLTAREN) 75 MG EC tablet, Take 1 tablet (75 mg total) by mouth 2 (two) times daily., Disp: 30 tablet, Rfl: 2   guaiFENesin-codeine 100-10 MG/5ML syrup, Take 5 mLs by mouth at bedtime as needed for cough., Disp: 40 mL, Rfl: 0   hydrALAZINE (APRESOLINE) 25 MG tablet, ONE TABLET BY MOUTH AT BREAKFAST LUNCH AND BEDTIME., Disp: 270 tablet, Rfl: 2   iron polysaccharides (NIFEREX) 150 MG capsule, Take 1 capsule (150 mg total) by mouth daily., Disp: 90 capsule, Rfl: 2   levothyroxine (SYNTHROID) 100 MCG tablet, Take 1 tablet (100 mcg total) by mouth daily., Disp: , Rfl:    Multiple Vitamin (MULTIVITAMIN WITH MINERALS) TABS tablet, Take 1 tablet by mouth daily., Disp: , Rfl:  pantoprazole (PROTONIX) 40 MG tablet, Take 1 tablet (40 mg total) by mouth daily., Disp: 90 tablet, Rfl: 2   sertraline (ZOLOFT) 25 MG tablet, Take 1 tablet (25 mg total) by mouth daily., Disp: 90 tablet, Rfl: 2   traMADol (ULTRAM) 50 MG tablet, Take 1 tablet (50 mg total) by mouth every 6 (six) hours as needed (pain)., Disp: 12 tablet, Rfl: 0   valsartan (DIOVAN) 160 MG tablet, Take 1 tablet (160 mg total) by mouth daily., Disp: 90 tablet, Rfl: 1   fluticasone (FLONASE) 50 MCG/ACT nasal spray, Place 1 spray into both nostrils daily for 3 days., Disp: 16 g, Rfl: 0   Allergies  Allergen Reactions   Lisinopril Cough     Men's preventive visit. Patient Health Questionnaire (PHQ-2) is  Flowsheet Row Office Visit from  02/05/2023 in [facility]  PHQ-2 Total Score 0     . Patient is on a low sodium diet. Marital status: Married. Relevant history for alcohol use is:  Social History   Substance and Sexual Activity  Alcohol Use Not Currently   Comment: only occ  . Relevant history for tobacco use is:  Social History   Tobacco Use  Smoking Status Former   Current packs/day: 0.00   Average packs/day: 0.8 packs/day for 47.0 years (35.2 ttl pk-yrs)   Types: Cigarettes   Start date: 07/07/1962   Quit date: 07/01/2009   Years since quitting: 13.6  Smokeless Tobacco Never  Tobacco Comments   0.75pack x 30 years, quit in 2011, resumed smoking in 2012, one pack lasts a week  .   Review of Systems  Constitutional: Negative.   HENT: Negative.    Eyes: Negative.  Negative for blurred vision.  Respiratory: Negative.  Negative for shortness of breath.   Cardiovascular: Negative.  Negative for chest pain and palpitations.  Endocrine: Negative.   Genitourinary: Negative.   Skin: Negative.   Allergic/Immunologic: Negative.   Neurological: Negative.   Hematological: Negative.      Today's Vitals   02/05/23 1119  BP: 126/82  Pulse: 67  Temp: 97.7 F (36.5 C)  SpO2: 98%  Weight: 141 lb 6.4 oz (64.1 kg)  Height: 5\' 9"  (1.753 m)   Body mass index is 20.88 kg/m.  Wt Readings from Last 3 Encounters:  02/05/23 141 lb 6.4 oz (64.1 kg)  11/11/22 142 lb (64.4 kg)  11/04/22 142 lb 3.2 oz (64.5 kg)    Objective:  Physical Exam Vitals and nursing note reviewed.  Constitutional:      Appearance: Normal appearance.  HENT:     Head: Normocephalic and atraumatic.     Right Ear: Tympanic membrane, ear canal and external ear normal.     Left Ear: Tympanic membrane, ear canal and external ear normal.  Eyes:     Extraocular Movements: Extraocular movements intact.     Conjunctiva/sclera: Conjunctivae normal.     Pupils: Pupils are equal, round, and reactive to light.   Cardiovascular:     Rate and Rhythm: Normal rate and regular rhythm.     Pulses: Normal pulses.     Heart sounds: Normal heart sounds.  Pulmonary:     Effort: Pulmonary effort is normal.     Breath sounds: Normal breath sounds.  Abdominal:     General: Abdomen is flat. Bowel sounds are normal.     Palpations: Abdomen is soft.  Genitourinary:    Comments: deferred Musculoskeletal:        General: Normal range  of motion.     Cervical back: Normal range of motion and neck supple.     Comments: Congenital abnormality LUE  Skin:    General: Skin is warm and dry.  Neurological:     General: No focal deficit present.     Mental Status: He is alert and oriented to person, place, and time.  Psychiatric:        Mood and Affect: Mood normal.        Behavior: Behavior normal.         Assessment And Plan:    Annual physical exam Assessment & Plan: A full exam was performed.  DRE deferred.  He is advised to get 30-45 minutes of regular exercise, no less than four to five days per week. Both weight-bearing and aerobic exercises are recommended.  She is advised to follow a healthy diet with at least six fruits/veggies per day, decrease intake of red meat and other saturated fats and to increase fish intake to twice weekly.  Meats/fish should not be fried -- baked, boiled or broiled is preferable. It is also important to cut back on your sugar intake.  Be sure to read labels - try to avoid anything with added sugar, high fructose corn syrup or other sweeteners.  If you must use a sweetener, you can try stevia or monkfruit.  It is also important to avoid artificially sweetened foods/beverages and diet drinks. Lastly, wear SPF 50 sunscreen on exposed skin and when in direct sunlight for an extended period of time.  Be sure to avoid fast food restaurants and aim for at least 60 ounces of water daily.       Hypertensive heart disease without heart failure Assessment & Plan: Chronic, improved  control.  He will continue with valsartan 160mg  2 tabs daily and amlodipine 10mg  daily. He is encouraged to follow low sodium diet. EKG performed, NSR w/ nonspecific T abnormality. He will f/u in four to six months for re-evaluation.  Orders: -     CBC -     POCT urinalysis dipstick -     Microalbumin / creatinine urine ratio -     EKG 12-Lead -     BMP8+EGFR  Stenosis of left carotid artery Assessment & Plan: Chronic, on statin therapy. Previously followed by Dr. Allyson Sabal; however, appears last visit was in 2020. Most recent carotid u/s was in Sept 2024.    Anemia, unspecified type Assessment & Plan: He denies having any gingival and/or rectal bleeding. I will check labs as below.   Orders: -     CBC -     Protein electrophoresis, serum  Acquired hypothyroidism Assessment & Plan: Chronic, currently taking levothyroxine daily. I will check thyroid panel and adjust meds as needed.   Orders: -     TSH  Tobacco use disorder Assessment & Plan: He is currently practicing abstinence from tobacco use. Last LDCT was performed earlier this year. He is due for repeat study in June 2025.    Idiopathic pulmonary hypertension (HCC) -     ECHOCARDIOGRAM COMPLETE; Future  Immunization due Best boy Vaccine 32yrs & older     Return for 1 year HM, 6 MONTH BP & THYROID . Patient was given opportunity to ask questions. Patient verbalized understanding of the plan and was able to repeat key elements of the plan. All questions were answered to their satisfaction.   I, Gwynneth Aliment, MD, have reviewed all documentation for  this visit. The documentation on 02/05/23 for the exam, diagnosis, procedures, and orders are all accurate and complete.

## 2023-02-05 NOTE — Patient Instructions (Signed)
Health Maintenance, Male Adopting a healthy lifestyle and getting preventive care are important in promoting health and wellness. Ask your health care provider about: The right schedule for you to have regular tests and exams. Things you can do on your own to prevent diseases and keep yourself healthy. What should I know about diet, weight, and exercise? Eat a healthy diet  Eat a diet that includes plenty of vegetables, fruits, low-fat dairy products, and lean protein. Do not eat a lot of foods that are high in solid fats, added sugars, or sodium. Maintain a healthy weight Body mass index (BMI) is a measurement that can be used to identify possible weight problems. It estimates body fat based on height and weight. Your health care provider can help determine your BMI and help you achieve or maintain a healthy weight. Get regular exercise Get regular exercise. This is one of the most important things you can do for your health. Most adults should: Exercise for at least 150 minutes each week. The exercise should increase your heart rate and make you sweat (moderate-intensity exercise). Do strengthening exercises at least twice a week. This is in addition to the moderate-intensity exercise. Spend less time sitting. Even light physical activity can be beneficial. Watch cholesterol and blood lipids Have your blood tested for lipids and cholesterol at 77 years of age, then have this test every 5 years. You may need to have your cholesterol levels checked more often if: Your lipid or cholesterol levels are high. You are older than 77 years of age. You are at high risk for heart disease. What should I know about cancer screening? Many types of cancers can be detected early and may often be prevented. Depending on your health history and family history, you may need to have cancer screening at various ages. This may include screening for: Colorectal cancer. Prostate cancer. Skin cancer. Lung  cancer. What should I know about heart disease, diabetes, and high blood pressure? Blood pressure and heart disease High blood pressure causes heart disease and increases the risk of stroke. This is more likely to develop in people who have high blood pressure readings or are overweight. Talk with your health care provider about your target blood pressure readings. Have your blood pressure checked: Every 3-5 years if you are 18-39 years of age. Every year if you are 40 years old or older. If you are between the ages of 65 and 75 and are a current or former smoker, ask your health care provider if you should have a one-time screening for abdominal aortic aneurysm (AAA). Diabetes Have regular diabetes screenings. This checks your fasting blood sugar level. Have the screening done: Once every three years after age 45 if you are at a normal weight and have a low risk for diabetes. More often and at a younger age if you are overweight or have a high risk for diabetes. What should I know about preventing infection? Hepatitis B If you have a higher risk for hepatitis B, you should be screened for this virus. Talk with your health care provider to find out if you are at risk for hepatitis B infection. Hepatitis C Blood testing is recommended for: Everyone born from 1945 through 1965. Anyone with known risk factors for hepatitis C. Sexually transmitted infections (STIs) You should be screened each year for STIs, including gonorrhea and chlamydia, if: You are sexually active and are younger than 77 years of age. You are older than 77 years of age and your   health care provider tells you that you are at risk for this type of infection. Your sexual activity has changed since you were last screened, and you are at increased risk for chlamydia or gonorrhea. Ask your health care provider if you are at risk. Ask your health care provider about whether you are at high risk for HIV. Your health care provider  may recommend a prescription medicine to help prevent HIV infection. If you choose to take medicine to prevent HIV, you should first get tested for HIV. You should then be tested every 3 months for as long as you are taking the medicine. Follow these instructions at home: Alcohol use Do not drink alcohol if your health care provider tells you not to drink. If you drink alcohol: Limit how much you have to 0-2 drinks a day. Know how much alcohol is in your drink. In the [place] one drink equals one 12 oz bottle of beer (355 mL), one 5 oz glass of wine (148 mL), or one 1 oz glass of hard liquor (44 mL). Lifestyle Do not use any products that contain nicotine or tobacco. These products include cigarettes, chewing tobacco, and vaping devices, such as e-cigarettes. If you need help quitting, ask your health care provider. Do not use street drugs. Do not share needles. Ask your health care provider for help if you need support or information about quitting drugs. General instructions Schedule regular health, dental, and eye exams. Stay current with your vaccines. Tell your health care provider if: You often feel depressed. You have ever been abused or do not feel safe at home. Summary Adopting a healthy lifestyle and getting preventive care are important in promoting health and wellness. Follow your health care provider's instructions about healthy diet, exercising, and getting tested or screened for diseases. Follow your health care provider's instructions on monitoring your cholesterol and blood pressure. This information is not intended to replace advice given to you by your health care provider. Make sure you discuss any questions you have with your health care provider. Document Revised: 07/09/2020 Document Reviewed: 07/09/2020 Elsevier Patient Education  2024 [employer]  

## 2023-02-12 LAB — CBC
Hematocrit: 42.1 % (ref 37.5–51.0)
Hemoglobin: 13.1 g/dL (ref 13.0–17.7)
MCH: 28.1 pg (ref 26.6–33.0)
MCHC: 31.1 g/dL — ABNORMAL LOW (ref 31.5–35.7)
MCV: 90 fL (ref 79–97)
Platelets: 207 x10E3/uL (ref 150–450)
RBC: 4.67 x10E6/uL (ref 4.14–5.80)
RDW: 11.7 % (ref 11.6–15.4)
WBC: 4.5 x10E3/uL (ref 3.4–10.8)

## 2023-02-12 LAB — PROTEIN ELECTROPHORESIS, SERUM
A/G Ratio: 1.1 (ref 0.7–1.7)
Albumin ELP: 3.9 g/dL (ref 2.9–4.4)
Alpha 1: 0.3 g/dL (ref 0.0–0.4)
Alpha 2: 0.8 g/dL (ref 0.4–1.0)
Beta: 1.2 g/dL (ref 0.7–1.3)
Gamma Globulin: 1.3 g/dL (ref 0.4–1.8)
Globulin, Total: 3.6 g/dL (ref 2.2–3.9)
M-Spike, %: 0.6 g/dL — ABNORMAL HIGH
Total Protein: 7.5 g/dL (ref 6.0–8.5)

## 2023-02-12 LAB — TSH: TSH: 7.04 u[IU]/mL — ABNORMAL HIGH (ref 0.450–4.500)

## 2023-02-12 LAB — MICROALBUMIN / CREATININE URINE RATIO
Creatinine, Urine: 100.3 mg/dL
Microalb/Creat Ratio: 3 mg/g{creat} (ref 0–29)
Microalbumin, Urine: 3.5 ug/mL

## 2023-02-12 LAB — BMP8+EGFR
BUN/Creatinine Ratio: 11 (ref 10–24)
BUN: 11 mg/dL (ref 8–27)
CO2: 21 mmol/L (ref 20–29)
Calcium: 9.7 mg/dL (ref 8.6–10.2)
Chloride: 106 mmol/L (ref 96–106)
Creatinine, Ser: 1.04 mg/dL (ref 0.76–1.27)
Glucose: 92 mg/dL (ref 70–99)
Potassium: 4.1 mmol/L (ref 3.5–5.2)
Sodium: 143 mmol/L (ref 134–144)
eGFR: 74 mL/min/{1.73_m2} (ref >59)

## 2023-02-15 ENCOUNTER — Other Ambulatory Visit: Payer: Self-pay | Admitting: Internal Medicine

## 2023-02-15 DIAGNOSIS — R778 Other specified abnormalities of plasma proteins: Secondary | ICD-10-CM

## 2023-02-15 NOTE — Assessment & Plan Note (Signed)
He denies having any gingival and/or rectal bleeding. I will check labs as below.

## 2023-02-15 NOTE — Assessment & Plan Note (Signed)
He is currently practicing abstinence from tobacco use. Last LDCT was performed earlier this year. He is due for repeat study in June 2025.

## 2023-02-15 NOTE — Assessment & Plan Note (Signed)
 Chronic, currently taking levothyroxine daily. I will check thyroid panel and adjust meds as needed.

## 2023-02-15 NOTE — Assessment & Plan Note (Signed)
Chronic, improved control.  He will continue with valsartan 160mg  2 tabs daily and amlodipine 10mg  daily. He is encouraged to follow low sodium diet. EKG performed, NSR w/ nonspecific T abnormality. He will f/u in four to six months for re-evaluation.

## 2023-02-15 NOTE — Assessment & Plan Note (Addendum)
Chronic, on statin therapy. Previously followed by Dr. Allyson Sabal; however, appears last visit was in 2020. Most recent carotid u/s was in Sept 2024.

## 2023-02-16 ENCOUNTER — Ambulatory Visit
Admission: RE | Admit: 2023-02-16 | Discharge: 2023-02-16 | Disposition: A | Discharge status: Home / Self Care | Payer: Medicare Other | Source: Ambulatory Visit | Attending: Urology

## 2023-02-16 DIAGNOSIS — R972 Elevated prostate specific antigen [PSA]: Secondary | ICD-10-CM

## 2023-02-16 MED ORDER — GADOPICLENOL 0.5 MMOL/ML IV SOLN
7.5000 mL | Freq: Once | INTRAVENOUS | Status: AC | PRN
  Administered 2023-02-16: 7.5 mL via INTRAVENOUS

## 2023-02-16 NOTE — Progress Notes (Signed)
Received referral for patient showing spike in M protein. Conferred with Dr Leonides Schanz who advised that patient could see himself or Dr Candise Che, whoever has first available appointment. Will continue to monitor.

## 2023-02-17 ENCOUNTER — Telehealth: Payer: Self-pay | Admitting: Hematology

## 2023-02-17 NOTE — Telephone Encounter (Signed)
 Left patient a vm regarding upcoming appointment

## 2023-02-20 ENCOUNTER — Encounter (HOSPITAL_COMMUNITY): Payer: Self-pay | Admitting: Internal Medicine

## 2023-02-24 ENCOUNTER — Other Ambulatory Visit: Payer: Medicare Other

## 2023-02-24 ENCOUNTER — Other Ambulatory Visit: Payer: Self-pay

## 2023-02-24 DIAGNOSIS — R778 Other specified abnormalities of plasma proteins: Secondary | ICD-10-CM

## 2023-02-24 MED ORDER — LEVOTHYROXINE SODIUM 100 MCG PO TABS: ORAL_TABLET | ORAL | 1 refills | Status: DC

## 2023-02-26 LAB — KAPPA/LAMBDA LIGHT CHAINS
Ig Kappa Free Light Chain: 22.6 mg/L — ABNORMAL HIGH (ref 3.3–19.4)
Ig Lambda Free Light Chain: 20 mg/L (ref 5.7–26.3)
KAPPA/LAMBDA RATIO: 1.13 (ref 0.26–1.65)

## 2023-03-21 ENCOUNTER — Other Ambulatory Visit: Payer: Self-pay | Admitting: Internal Medicine

## 2023-03-21 DIAGNOSIS — I10 Essential (primary) hypertension: Secondary | ICD-10-CM

## 2023-03-24 ENCOUNTER — Other Ambulatory Visit: Payer: Self-pay

## 2023-03-24 DIAGNOSIS — D500 Iron deficiency anemia secondary to blood loss (chronic): Secondary | ICD-10-CM

## 2023-03-25 ENCOUNTER — Inpatient Hospital Stay: Payer: Medicare Other | Admitting: Hematology

## 2023-03-25 ENCOUNTER — Inpatient Hospital Stay: Payer: Medicare Other | Attending: Internal Medicine

## 2023-03-26 ENCOUNTER — Ambulatory Visit (HOSPITAL_COMMUNITY): Payer: Medicare Other | Attending: Cardiology

## 2023-03-26 DIAGNOSIS — I270 Primary pulmonary hypertension: Secondary | ICD-10-CM | POA: Insufficient documentation

## 2023-03-26 LAB — ECHOCARDIOGRAM COMPLETE
Area-P 1/2: 4.41 cm2
S' Lateral: 2.9 cm

## 2023-04-26 ENCOUNTER — Other Ambulatory Visit: Payer: Self-pay | Admitting: Internal Medicine

## 2023-04-26 ENCOUNTER — Other Ambulatory Visit: Payer: Self-pay | Admitting: Orthopaedic Surgery

## 2023-05-11 ENCOUNTER — Other Ambulatory Visit (HOSPITAL_COMMUNITY): Payer: Self-pay | Admitting: Urology

## 2023-05-11 DIAGNOSIS — C61 Malignant neoplasm of prostate: Secondary | ICD-10-CM

## 2023-05-14 ENCOUNTER — Other Ambulatory Visit: Payer: Self-pay | Admitting: Internal Medicine

## 2023-05-14 NOTE — Telephone Encounter (Signed)
 Copied from CRM [id number]. Topic: Clinical - Medication Refill >> May 14, 2023  2:55 PM Shelah Lewandowsky wrote: Most Recent Primary Care Visit:  Provider: Ileana Roup  Department: [facility]  Visit Type: LAB  Date: 02/24/2023  Medication: amLODipine (NORVASC) 10 MG tablet  pantoprazole (PROTONIX) 40 MG tablet iron polysaccharides (NIFEREX) 150 MG capsule  Has the patient contacted their pharmacy? Yes (Agent: If no, request that the patient contact the pharmacy for the refill. If patient does not wish to contact the pharmacy document the reason why and proceed with request.) (Agent: If yes, when and what did the pharmacy advise?)  Is this the correct pharmacy for this prescription? Yes If no, delete pharmacy and type the correct one.  This is the patient's preferred pharmacy:  [phone] [place], [place] - [address] [place] [address] Phone: [phone] Fax: [phone]   Has the prescription been filled recently? Yes  Is the patient out of the medication? Yes  Has the patient been seen for an appointment in the last year OR does the patient have an upcoming appointment? Yes  Can we respond through MyChart? Yes  Agent: Please be advised that Rx refills may take up to 3 business days. We ask that you follow-up with your pharmacy.

## 2023-05-19 MED ORDER — POLYSACCHARIDE IRON COMPLEX 150 MG PO CAPS: 150.0000 mg | ORAL_CAPSULE | Freq: Every day | ORAL | 2 refills | Status: AC

## 2023-05-19 MED ORDER — AMLODIPINE BESYLATE 10 MG PO TABS: ORAL_TABLET | ORAL | 2 refills | Status: DC

## 2023-05-19 MED ORDER — PANTOPRAZOLE SODIUM 40 MG PO TBEC: 40.0000 mg | DELAYED_RELEASE_TABLET | Freq: Every day | ORAL | 2 refills | Status: DC

## 2023-05-20 ENCOUNTER — Encounter (HOSPITAL_COMMUNITY)
Admission: RE | Admit: 2023-05-20 | Discharge: 2023-05-20 | Disposition: A | Discharge status: Home / Self Care | Source: Ambulatory Visit | Attending: Urology | Admitting: Urology

## 2023-05-20 ENCOUNTER — Telehealth: Payer: Self-pay | Admitting: Hematology

## 2023-05-20 DIAGNOSIS — C61 Malignant neoplasm of prostate: Secondary | ICD-10-CM | POA: Insufficient documentation

## 2023-05-20 MED ORDER — FLOTUFOLASTAT F 18 GALLIUM 296-5846 MBQ/ML IV SOLN
7.8000 | Freq: Once | INTRAVENOUS | Status: AC
  Administered 2023-05-20: 7.8 via INTRAVENOUS

## 2023-05-28 NOTE — Progress Notes (Signed)
 HEMATOLOGY/ONCOLOGY CLINIC NOTE  Date of Service: 05/29/2023  Follow-up iron deficiency anemia  Patient Care Team: Dorothyann Peng, MD as PCP - General (Internal Medicine) Allyson Sabal Delton See, MD as PCP - Cardiology (Cardiology) Runell Gess, MD as Consulting Physician (Cardiology)   Endocrinologist: Dr. Dorisann Frames  CHIEF COMPLAINTS/PURPOSE OF CONSULTATION:  F/u for IDA  DIAGNOSIS  Microcytic hypochromic Anemia with MCV 79.4 due to Iron deficiency. Ferritin 27, Iron sat too low to calculate. No overt evidence of bleeding. Patient notes recent fecal occult blood neg x 1. He has had significant iron deficiency anemia in the past in 2011 with a ferritin level of 9 and required a blood transfusion. EGD showed duodenitis and hiatal hernia. Patient remains at high risk of GI bleeding since he is on ASA+ plavix for his endovascular stent graft for repaired AAA and is on Cilostazole for PAD. Some element of anemia also appears to be temporally related to methimazole use (though agranulocytosis is the usual concern and isolated anemia is less usual) The hyperthyroidism itself can also contribute to the anemia until corrected. SPEP with no M spike No evidence of hemolysis B12/folate WNL  Current treatment -p.o. iron infusion plus -previously IV feraheme. Continue prn IV feraheme.  HISTORY OF PRESENTING ILLNESS: plz see initial consultation for details on initial presentation   INTERVAL HISTORY: Shawn Fernandez is a 78 y.o. male of his is here for continued evaluation and management iron deficiency anemia. Patient was last seen by me on 10/30/2021 and was doing well overall with no new medical complaints.   Today, he is accompanied by his wife and an additional family member. Patient reports that he has been doing fairly well over the last 6 months. Patient reports a recent diagnosis of prostate cancer.   Patient had a PET PSMA scan on 05/20/2023 ordered by his urologist after  findings of elevated PSA levels. He reports that his PSA level was found to be between 5 and 10 during routine screening with his PCP.   Patient reports that his gleason score was high at 8s-9s . He reports that he will receive radiation therapy.He reports that he has not connected with his radiation oncologist, Dr. Kathrynn Running yet.   Patient denies any urinary symptoms, blood in the urine, difficulty passing urine, new bone pain,  back pain, pain along the spine, pain around hips/shoulder, or abdominal pain.  Patient reports an episode involving a stomach bug recently, which has improved. He denies any change in bowel habits otherwise. He denies any concern for black stools, blood in the stools, or sign of GI bleeding.   Patient continues to take oral iron which he is tolerating well.   He continues to be on aspirin only at this time.   MEDICAL HISTORY:   Past Medical History:  Diagnosis Date   Blood clot in abdominal vein    treated at [facility]   Carotid artery disease (HCC)    Dyslipidemia    FHx: heart disease    History of blood transfusion 8-9 yrs ago   HTN (hypertension)    Peripheral vascular disease (HCC)    Hyperthyroidism -being followed by Dr. Talmage Nap - was previously on methimazole for 6-8 months and has been off for a few weeks now. He was treated with radioactive iodine about one week ago.  He had presented with progressive weight loss from 2 years prior to diagnosis.  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  Previous history of iron deficiency anemia ferritin was 9 about 5 years ago in 2011  SURGICAL HISTORY: Past Surgical History:  Procedure Laterality Date   ANKLE SURGERY Right yrs ago   arm surgery Left yrs ago   2 rods inserted, 1 rod later removed   COLONOSCOPY WITH PROPOFOL N/A 09/16/2013   Procedure: COLONOSCOPY WITH PROPOFOL;  Surgeon: Theda Belfast, MD;  Location: [facility];  Service: Endoscopy;   Laterality: N/A;   LOWER EXTREMITY ANGIOGRAM N/A 07/24/2011   Procedure: LOWER EXTREMITY ANGIOGRAM;  Surgeon: Runell Gess, MD;  Location: [facility];  Service: Cardiovascular;  Laterality: N/A;   stent in abdominal vein  4-5 yrs ago   US ECHOCARDIOGRAPHY  07-20-2007   EF 55-60%    SOCIAL HISTORY: Social History   Socioeconomic History   Marital status: Married    Spouse name: Not on file   Number of children: Not on file   Years of education: Not on file   Highest education level: Not on file  Occupational History   Occupation: retired  Tobacco Use   Smoking status: Former    Current packs/day: 0.00    Average packs/day: 0.8 packs/day for 47.0 years (35.2 ttl pk-yrs)    Types: Cigarettes    Start date: 07/07/1962    Quit date: 07/01/2009    Years since quitting: 13.9   Smokeless tobacco: Never   Tobacco comments:    0.75pack x 30 years, quit in 2011, resumed smoking in 2012, one pack lasts a week  Vaping Use   Vaping status: Never Used  Substance and Sexual Activity   Alcohol use: Not Currently    Comment: only occ   Drug use: No   Sexual activity: Yes  Other Topics Concern   Not on file  Social History Narrative   Not on file   Social Drivers of Health   Financial Resource Strain: Low Risk  (06/04/2022)   Overall Financial Resource Strain (CARDIA)    Difficulty of Paying Living Expenses: Not hard at all  Food Insecurity: No Food Insecurity (06/04/2022)   Hunger Vital Sign    Worried About Running Out of Food in the Last Year: Never true    Ran Out of Food in the Last Year: Never true  Transportation Needs: No Transportation Needs (06/04/2022)   PRAPARE - Administrator, Civil Service (Medical): No    Lack of Transportation (Non-Medical): No  Physical Activity: Inactive (06/04/2022)   Exercise Vital Sign    Days of Exercise per Week: 0 days    Minutes of Exercise per Session: 0 min  Stress: No Stress Concern Present (06/04/2022)   [employer] of  Occupational Health - Occupational Stress Questionnaire    Feeling of Stress : Not at all  Social Connections: Not on file  Intimate Partner Violence: Not At Risk (12/16/2017)   Humiliation, Afraid, Rape, and Kick questionnaire    Fear of Current or Ex-Partner: No    Emotionally Abused: No    Physically Abused: No    Sexually Abused: No    FAMILY HISTORY: Family History  Problem Relation Age of Onset   Heart disease Brother    Heart disease Mother    Cancer Father        ? type    ALLERGIES:  is allergic to lisinopril.  MEDICATIONS:  Current Outpatient Medications  Medication Sig Dispense Refill   albuterol (VENTOLIN HFA) 108 (90 Base) MCG/ACT inhaler TAKE 2 PUFFS BY MOUTH EVERY  6 HOURS AS NEEDED FOR WHEEZE OR SHORTNESS OF BREATH 8.5 each 1   amLODipine (NORVASC) 10 MG tablet TAKE 1 TABLET BY MOUTH EVERYDAY AT BEDTIME 90 tablet 2   aspirin EC 81 MG tablet Take 81 mg by mouth daily.     atorvastatin (LIPITOR) 10 MG tablet One tab po daily, except Sundays Start 6/10 30 tablet 11   Cholecalciferol (VITAMIN D3) 1000 units CAPS Take 1 capsule by mouth daily.     diclofenac (VOLTAREN) 75 MG EC tablet TAKE 1 TABLET BY MOUTH TWICE A DAY 30 tablet 2   fluticasone (FLONASE) 50 MCG/ACT nasal spray Place 1 spray into both nostrils daily for 3 days. 16 g 0   guaiFENesin-codeine 100-10 MG/5ML syrup Take 5 mLs by mouth at bedtime as needed for cough. 40 mL 0   hydrALAZINE (APRESOLINE) 25 MG tablet ONE TABLET BY MOUTH AT BREAKFAST LUNCH AND BEDTIME. 270 tablet 2   iron polysaccharides (NIFEREX) 150 MG capsule Take 1 capsule (150 mg total) by mouth daily. 90 capsule 2   levothyroxine (SYNTHROID) 100 MCG tablet Take 1 tablet by mouth daily Monday-Saturday and 1 and a half tablet on Sundays. 90 tablet 1   Multiple Vitamin (MULTIVITAMIN WITH MINERALS) TABS tablet Take 1 tablet by mouth daily.     pantoprazole (PROTONIX) 40 MG tablet Take 1 tablet (40 mg total) by mouth daily. 90 tablet 2    sertraline (ZOLOFT) 25 MG tablet Take 1 tablet (25 mg total) by mouth daily. 90 tablet 2   traMADol (ULTRAM) 50 MG tablet Take 1 tablet (50 mg total) by mouth every 6 (six) hours as needed (pain). 12 tablet 0   valsartan (DIOVAN) 160 MG tablet TAKE 1 TABLET BY MOUTH EVERY DAY 90 tablet 1   No current facility-administered medications for this visit.    REVIEW OF SYSTEMS:    10 Point review of Systems was done is negative except as noted above.   PHYSICAL EXAMINATION: ECOG FS:2 - Symptomatic, <50% confined to bed  Vitals:   05/29/23 1001  BP: (!) 144/64  Pulse: 75  Resp: 16  Temp: 97.6 F (36.4 C)  SpO2: 99%     Wt Readings from Last 3 Encounters:  02/05/23 141 lb 6.4 oz (64.1 kg)  11/11/22 142 lb (64.4 kg)  11/04/22 142 lb 3.2 oz (64.5 kg)   There is no height or weight on file to calculate BMI.     GENERAL:alert, in no acute distress and comfortable SKIN: no acute rashes, no significant lesions EYES: conjunctiva are pink and non-injected, sclera anicteric OROPHARYNX: MMM, no exudates, no oropharyngeal erythema or ulceration NECK: supple, no JVD LYMPH:  no palpable lymphadenopathy in the cervical, axillary or inguinal regions LUNGS: clear to auscultation b/l with normal respiratory effort HEART: regular rate & rhythm ABDOMEN:  normoactive bowel sounds , non tender, not distended. Extremity: no pedal edema PSYCH: alert & oriented x 3 with fluent speech NEURO: no focal motor/sensory deficits   LABORATORY DATA:  I have reviewed the data as listed  .    Latest Ref Rng & Units 02/05/2023   12:34 PM 07/30/2022   11:17 AM 01/29/2022    3:04 PM  CBC  WBC 3.4 - 10.8 x10E3/uL 4.5  5.1  4.6   Hemoglobin 13.0 - 17.7 g/dL 16.1  09.6  04.5   Hematocrit 37.5 - 51.0 % 42.1  40.6  36.5   Platelets 150 - 450 x10E3/uL 207  230  220    . CBC  Component Value Date/Time   WBC 4.5 02/05/2023 1234   WBC 5.5 10/30/2021 1359   WBC 3.6 (L) 09/06/2020 1005   RBC 4.67  02/05/2023 1234   RBC 4.30 10/30/2021 1359   HGB 13.1 02/05/2023 1234   HGB 13.9 03/21/2016 0919   HCT 42.1 02/05/2023 1234   HCT 43.1 03/21/2016 0919   PLT 207 02/05/2023 1234   MCV 90 02/05/2023 1234   MCV 89.2 03/21/2016 0919   MCH 28.1 02/05/2023 1234   MCH 29.8 10/30/2021 1359   MCHC 31.1 (L) 02/05/2023 1234   MCHC 32.9 10/30/2021 1359   RDW 11.7 02/05/2023 1234   RDW 13.1 03/21/2016 0919   LYMPHSABS 1.7 10/30/2021 1359   LYMPHSABS 2.1 11/03/2018 1626   LYMPHSABS 2.5 03/21/2016 0919   MONOABS 0.5 10/30/2021 1359   MONOABS 0.8 03/21/2016 0919   EOSABS 0.2 10/30/2021 1359   EOSABS 0.2 11/03/2018 1626   BASOSABS 0.1 10/30/2021 1359   BASOSABS 0.1 11/03/2018 1626   BASOSABS 0.1 03/21/2016 0919       Latest Ref Rng & Units 02/05/2023   12:34 PM 11/04/2022   11:53 AM 08/12/2022   12:23 PM  CMP  Glucose 70 - 99 mg/dL 92  161  89   BUN 8 - 27 mg/dL 11  14  9    Creatinine 0.76 - 1.27 mg/dL 0.96  0.45  4.09   Sodium 134 - 144 mmol/L 143  141  141   Potassium 3.5 - 5.2 mmol/L 4.1  4.0  4.1   Chloride 96 - 106 mmol/L 106  105  105   CO2 20 - 29 mmol/L 21  21  19    Calcium 8.6 - 10.2 mg/dL 9.7  81.1  9.3   Total Protein 6.0 - 8.5 g/dL 7.5  7.4    Total Bilirubin 0.0 - 1.2 mg/dL  0.5    Alkaline Phos 44 - 121 IU/L  125    AST 0 - 40 IU/L  23    ALT 0 - 44 IU/L  8     . Lab Results  Component Value Date   IRON 111 07/30/2022   TIBC 374 07/30/2022   IRONPCTSAT 30 07/30/2022   (Iron and TIBC)  Lab Results  Component Value Date   FERRITIN 36 07/30/2022   RADIOGRAPHIC STUDIES: I have personally reviewed the radiological images as listed and agreed with the findings in the report. No results found.   ASSESSMENT & PLAN:   78 yo AAM with   1) Microcytic hypochromic Anemia due to Iron deficiency - now resolved.  Previously had Ferritin 27, Iron sat too low to calculate. No overt evidence of bleeding. Patient notes recent fecal occult blood neg x 1. He has had  significant iron deficiency anemia in the past in 2011 with a ferritin level of 9 and required a blood transfusion. EGD showed duodenitis and hiatal hernia. Patient was previously on ASA+ plavix for his endovascular stent graft for repaired AAA and is on Cilostazole for PAD.  He is currently only on aspirin. Some element of anemia also appears to be temporally related to methimazole use (though agranulocytosis is the usual concern and isolated anemia is less usual) The hyperthyroidism itself can also contribute to the anemia until corrected. SPEP with no M spike No evidence of hemolysis B12/folate WNL  PLAN:  -Discussed lab results on 05/29/2023 in detail with patient. CBC normal, showed WBC of 3.9K, hemoglobin of 13.4, and platelets of 170K. -hgb is progressively improving -RDW  normal -MCV normal -there is no signifiant iron deficiency at this time -will follow up on iron labs -if oral iron replacement keeps his iron levels well controlled, then there would be no role for IV iron -Continue iron polysaccharide 150mg  po daily -CMP shows mild hypokalemia with potassium 3.3; CMP otherwise stable -other labs ferritin -02/16/2023 MRI of prostate showed: 1. Substantial PI-RADS category 4 lesion of the right posterolateral peripheral zone at the apex. Targeting data sent to [facility]. 2. Mild prostatomegaly. 3. Iliac and right common femoral artery atheromatous vascular plaque. -educated patient that a gleason score is a score defined by the pathologist based on how aggressive his prostate cancer cells appear -discussed that if his prostate cancer is localized/limited with no concern for spread beyond prostate his urologist and radiation oncologist would primarily be involved. Discussed that if there is concern for spread beyond the prostate, there may be a role for our involvement as well.  -discussed that usually, if his prostate cancer is localized, there is consideration of local treamtent with  radiation or surgery, usually radiation therapy. Depending on the risk prolife, hormone blockage would be recommended from 6 months to a few years.  -answered all of patient's and his family member's questions in detail -patient shall return to clinic in 6 months for prostate cancer and iron deficiency anemia.  -Continue follow-up with PCP and GI for monitoring of GI bleeding.  FOLLOW UP: RTC with Dr Candise Che with labs in 6 months  The total time spent in the appointment was 21 minutes* .  All of the patient's questions were answered with apparent satisfaction. The patient knows to call the clinic with any problems, questions or concerns.   Wyvonnia Lora MD MS AAHIVMS [facility] Hematology/Oncology Physician [facility]  .*Total Encounter Time as defined by the Centers for Medicare and Medicaid Services includes, in addition to the face-to-face time of a patient visit (documented in the note above) non-face-to-face time: obtaining and reviewing outside history, ordering and reviewing medications, tests or procedures, care coordination (communications with other health care professionals or caregivers) and documentation in the medical record.    I,Mitra Faeizi,acting as a Neurosurgeon for Wyvonnia Lora, MD.,have documented all relevant documentation on the behalf of Wyvonnia Lora, MD,as directed by  Wyvonnia Lora, MD while in the presence of Wyvonnia Lora, MD.  .I have reviewed the above documentation for accuracy and completeness, and I agree with the above. Johney Maine MD

## 2023-05-29 ENCOUNTER — Inpatient Hospital Stay: Attending: Internal Medicine

## 2023-05-29 ENCOUNTER — Inpatient Hospital Stay (HOSPITAL_BASED_OUTPATIENT_CLINIC_OR_DEPARTMENT_OTHER): Admitting: Hematology

## 2023-05-29 VITALS — BP 144/64 | HR 75 | Temp 97.6°F | Resp 16 | Wt 140.7 lb

## 2023-05-29 DIAGNOSIS — Z87891 Personal history of nicotine dependence: Secondary | ICD-10-CM | POA: Diagnosis not present

## 2023-05-29 DIAGNOSIS — D500 Iron deficiency anemia secondary to blood loss (chronic): Secondary | ICD-10-CM

## 2023-05-29 DIAGNOSIS — D509 Iron deficiency anemia, unspecified: Secondary | ICD-10-CM | POA: Insufficient documentation

## 2023-05-29 DIAGNOSIS — C61 Malignant neoplasm of prostate: Secondary | ICD-10-CM | POA: Insufficient documentation

## 2023-05-29 LAB — CBC WITH DIFFERENTIAL (CANCER CENTER ONLY)
Abs Immature Granulocytes: 0.01 10*3/uL (ref 0.00–0.07)
Basophils Absolute: 0.1 10*3/uL (ref 0.0–0.1)
Basophils Relative: 1 %
Eosinophils Absolute: 0.2 10*3/uL (ref 0.0–0.5)
Eosinophils Relative: 4 %
HCT: 42.6 % (ref 39.0–52.0)
Hemoglobin: 13.4 g/dL (ref 13.0–17.0)
Immature Granulocytes: 0 %
Lymphocytes Relative: 33 %
Lymphs Abs: 1.3 10*3/uL (ref 0.7–4.0)
MCH: 28.1 pg (ref 26.0–34.0)
MCHC: 31.5 g/dL (ref 30.0–36.0)
MCV: 89.3 fL (ref 80.0–100.0)
Monocytes Absolute: 0.4 10*3/uL (ref 0.1–1.0)
Monocytes Relative: 11 %
Neutro Abs: 2 10*3/uL (ref 1.7–7.7)
Neutrophils Relative %: 51 %
Platelet Count: 170 10*3/uL (ref 150–400)
RBC: 4.77 MIL/uL (ref 4.22–5.81)
RDW: 14.4 % (ref 11.5–15.5)
WBC Count: 3.9 10*3/uL — ABNORMAL LOW (ref 4.0–10.5)
nRBC: 0 % (ref 0.0–0.2)

## 2023-05-29 LAB — CMP (CANCER CENTER ONLY)
ALT: 14 U/L (ref 0–44)
AST: 46 U/L — ABNORMAL HIGH (ref 15–41)
Albumin: 4.4 g/dL (ref 3.5–5.0)
Alkaline Phosphatase: 89 U/L (ref 38–126)
Anion gap: 8 (ref 5–15)
BUN: 8 mg/dL (ref 8–23)
CO2: 26 mmol/L (ref 22–32)
Calcium: 9.2 mg/dL (ref 8.9–10.3)
Chloride: 104 mmol/L (ref 98–111)
Creatinine: 0.97 mg/dL (ref 0.61–1.24)
GFR, Estimated: >60 mL/min (ref >60)
Glucose, Bld: 91 mg/dL (ref 70–99)
Potassium: 3.3 mmol/L — ABNORMAL LOW (ref 3.5–5.1)
Sodium: 138 mmol/L (ref 135–145)
Total Bilirubin: 0.7 mg/dL (ref 0.0–1.2)
Total Protein: 8 g/dL (ref 6.5–8.1)

## 2023-05-29 LAB — FERRITIN: Ferritin: 53 ng/mL (ref 24–336)

## 2023-05-29 LAB — IRON AND IRON BINDING CAPACITY (CC-WL,HP ONLY)
Iron: 99 ug/dL (ref 45–182)
Saturation Ratios: 25 % (ref 17.9–39.5)
TIBC: 399 ug/dL (ref 250–450)
UIBC: 300 ug/dL (ref 117–376)

## 2023-05-29 LAB — SAMPLE TO BLOOD BANK

## 2023-05-30 ENCOUNTER — Telehealth: Payer: Self-pay | Admitting: Hematology

## 2023-05-30 NOTE — Telephone Encounter (Signed)
 Left patient a vm regarding upcoming appointment

## 2023-06-03 ENCOUNTER — Other Ambulatory Visit: Payer: Self-pay | Admitting: Internal Medicine

## 2023-06-03 NOTE — Telephone Encounter (Signed)
 Copied from CRM [id number]. Topic: Clinical - Medication Refill >> Jun 03, 2023  2:34 PM Clayton Bibles wrote: Most Recent Primary Care Visit:  Provider: Ileana Roup  Department: [facility]  Visit Type: LAB  Date: 02/24/2023  Medication: levothyroxine (SYNTHROID) 100 MCG tablet  Has the patient contacted their pharmacy? Yes (Agent: If no, request that the patient contact the pharmacy for the refill. If patient does not wish to contact the pharmacy document the reason why and proceed with request.) (Agent: If yes, when and what did the pharmacy advise?) Pharmacy needs a order to refill  Is this the correct pharmacy for this prescription? Yes If no, delete pharmacy and type the correct one.  This is the patient's preferred pharmacy:  [phone] [place],  - [address] [place] [address] Phone: [phone] Fax: [phone]   Has the prescription been filled recently? No  Is the patient out of the medication? Yes - just ran out  Has the patient been seen for an appointment in the last year OR does the patient have an upcoming appointment? Yes  Can we respond through MyChart? No  Agent: Please be advised that Rx refills may take up to 3 business days. We ask that you follow-up with your pharmacy.

## 2023-06-04 ENCOUNTER — Encounter: Payer: Self-pay | Admitting: Hematology

## 2023-06-23 NOTE — Progress Notes (Signed)
 GU Location of Tumor / Histology: Prostate Ca  If Prostate Cancer, Gleason Score is (4 + 4) and PSA is (10.60 on 02/03/2023)  Philippa Bray presented as referral from Dr. Amye Baller Herrick([facility]) elevated PSA.  Biopsies     05/20/2023 Dr. Salli Crawley NM PET (PSMA) Skull to Mid Thigh CLINICAL DATA: Prostate carcinoma. PSA equal 10.6.   IMPRESSION: 1. Large region of intense radiotracer activity in the RIGHT lobe of the prostate gland consistent with primary prostate adenocarcinoma. 2. No evidence of metastatic adenopathy in the pelvis or periaortic retroperitoneum. 3. No evidence of visceral metastasis or skeletal metastasis.  Past/Anticipated interventions by urology, if any:     Past/Anticipated interventions by medical oncology, if any: NA  Weight changes, if any:  No  IPSS:  3 SHIM:  11  Bowel/Bladder complaints, if any:  Urinary frequency/urgency.  Denies bowel issues at this time.  Nausea/Vomiting, if any: No  Pain issues, if any:  0/10  SAFETY ISSUES: Prior radiation?  No Pacemaker/ICD? No Possible current pregnancy? Male Is the patient on methotrexate? No  Current Complaints / other details:

## 2023-06-29 NOTE — Progress Notes (Signed)
 Radiation Oncology         [phone] ________________________________  Initial Outpatient Consultation - Conducted via telephone  Name: Shawn Fernandez MRN: [id number]  Date: 06/30/2023  DOB: 12-12-1945  SW:NIOEVOJ, Jerrold Morgan, MD  Andrez Banker, MD   REFERRING PHYSICIAN: Andrez Banker, MD  DIAGNOSIS: 78 y.o. gentleman with Stage T1c adenocarcinoma of the prostate with Gleason score of 4+4, and PSA of 10.6.    ICD-10-CM   1. Malignant neoplasm of prostate (HCC)  C61       HISTORY OF PRESENT ILLNESS: Shawn Fernandez is a 78 y.o. male with a diagnosis of prostate cancer. He has a longstanding history of elevated PSA dating back to 2019 when he was initially referred to Dr. Dulcy Gibney with a PSA of 3.9.  He met with Dr. Dulcy Gibney on September 29, 2017 and digital rectal exam was without any concerning findings.  A repeat PSA that day had decreased to 3.16 and remained in the normal range at 3.63 when repeated in March 2020.  His PSA continued to fluctuate between 4-6.7 so they elected to continue to monitor.  Unfortunately, he was lost to follow-up thereafter until 06/2022 when his PSA had significantly increased to 11.6. His PSA decreased to 9.55 on repeat in 12/2022 but increased again to 10.6 in 02/2023.  This prompted further evaluation with a prostate MRI on 02/16/23 showing a substantial PI-RADS 4 lesion in the right posterolateral peripheral zone at the apex. The patient proceeded to MRI fusion biopsy of the prostate on 04/30/23.  The prostate volume measured 52 cc.  Out of 14 core biopsies, 5 were positive.  The maximum Gleason score was 4+4, and this was seen in 1 of 2 samples from the MRI ROI. Additionally, Gleason 4+3 was seen in the right mid lateral (with perineural invasion) and right mid, and Gleason 3+4 in the right apex and 1 of 2 samples from the MRI ROI (small focus, with PNI).  He underwent staging PSMA PET scan on 05/20/23 showing no evidence of disease outside of the prostate  and received his first dose of ADT with a 45-month Eligard injection on 06/15/23.  The patient reviewed the biopsy results with his urologist and he has kindly been referred today for discussion of potential radiation treatment options.  His wife is accompanying him on the telephone for today's visit.   PREVIOUS RADIATION THERAPY: No  PAST MEDICAL HISTORY:  Past Medical History:  Diagnosis Date   Blood clot in abdominal vein    treated at [facility]   Carotid artery disease (HCC)    Dyslipidemia    FHx: heart disease    History of blood transfusion 8-9 yrs ago   HTN (hypertension)    Peripheral vascular disease (HCC)       PAST SURGICAL HISTORY: Past Surgical History:  Procedure Laterality Date   ANKLE SURGERY Right yrs ago   arm surgery Left yrs ago   2 rods inserted, 1 rod later removed   COLONOSCOPY WITH PROPOFOL  N/A 09/16/2013   Procedure: COLONOSCOPY WITH PROPOFOL ;  Surgeon: Almeda Aris, MD;  Location: [facility];  Service: Endoscopy;  Laterality: N/A;   LOWER EXTREMITY ANGIOGRAM N/A 07/24/2011   Procedure: LOWER EXTREMITY ANGIOGRAM;  Surgeon: Avanell Leigh, MD;  Location: [facility] CATH LAB;  Service: Cardiovascular;  Laterality: N/A;   stent in abdominal vein  4-5 yrs ago   US  ECHOCARDIOGRAPHY  07-20-2007   EF 55-60%    FAMILY HISTORY:  Family History  Problem  Relation Age of Onset   Heart disease Brother    Heart disease Mother    Cancer Father        ? type    SOCIAL HISTORY:  Social History   Socioeconomic History   Marital status: Married    Spouse name: Not on file   Number of children: Not on file   Years of education: Not on file   Highest education level: Not on file  Occupational History   Occupation: retired  Tobacco Use   Smoking status: Every Day    Current packs/day: 0.00    Average packs/day: 0.8 packs/day for 47.0 years (35.2 ttl pk-yrs)    Types: Cigarettes    Start date: 07/07/1962    Last attempt to quit: 07/01/2009    Years since quitting:  14.0   Smokeless tobacco: Never   Tobacco comments:    0.75pack x 30 years, quit in 2011, resumed smoking in 2012, one pack lasts a week  Vaping Use   Vaping status: Never Used  Substance and Sexual Activity   Alcohol use: Yes    Comment: only occ   Drug use: No   Sexual activity: Yes  Other Topics Concern   Not on file  Social History Narrative   Not on file   Social Drivers of Health   Financial Resource Strain: Low Risk  (06/04/2022)   Overall Financial Resource Strain (CARDIA)    Difficulty of Paying Living Expenses: Not hard at all  Food Insecurity: No Food Insecurity (06/04/2022)   Hunger Vital Sign    Worried About Running Out of Food in the Last Year: Never true    Ran Out of Food in the Last Year: Never true  Transportation Needs: No Transportation Needs (06/04/2022)   PRAPARE - Administrator, Civil Service (Medical): No    Lack of Transportation (Non-Medical): No  Physical Activity: Inactive (06/04/2022)   Exercise Vital Sign    Days of Exercise per Week: 0 days    Minutes of Exercise per Session: 0 min  Stress: No Stress Concern Present (06/04/2022)   [employer] of Occupational Health - Occupational Stress Questionnaire    Feeling of Stress : Not at all  Social Connections: Not on file  Intimate Partner Violence: Not At Risk (12/16/2017)   Humiliation, Afraid, Rape, and Kick questionnaire    Fear of Current or Ex-Partner: No    Emotionally Abused: No    Physically Abused: No    Sexually Abused: No    ALLERGIES: Lisinopril  MEDICATIONS:  Current Outpatient Medications  Medication Sig Dispense Refill   albuterol  (VENTOLIN  HFA) 108 (90 Base) MCG/ACT inhaler TAKE 2 PUFFS BY MOUTH EVERY 6 HOURS AS NEEDED FOR WHEEZE OR SHORTNESS OF BREATH 8.5 each 1   amLODipine  (NORVASC ) 10 MG tablet TAKE 1 TABLET BY MOUTH EVERYDAY AT BEDTIME 90 tablet 2   aspirin  EC 81 MG tablet Take 81 mg by mouth daily.     atorvastatin  (LIPITOR) 10 MG tablet One tab po daily,  except Sundays Start 6/10 30 tablet 11   Cholecalciferol (VITAMIN D3) 1000 units CAPS Take 1 capsule by mouth daily.     diclofenac  (VOLTAREN ) 75 MG EC tablet TAKE 1 TABLET BY MOUTH TWICE A DAY 30 tablet 2   hydrALAZINE  (APRESOLINE ) 25 MG tablet ONE TABLET BY MOUTH AT BREAKFAST LUNCH AND BEDTIME. 270 tablet 2   iron  polysaccharides (NIFEREX) 150 MG capsule Take 1 capsule (150 mg total) by mouth daily. 90 capsule  2   levothyroxine  (SYNTHROID ) 100 MCG tablet Take 100 mcg by mouth daily before breakfast.     Multiple Vitamin (MULTIVITAMIN WITH MINERALS) TABS tablet Take 1 tablet by mouth daily.     pantoprazole  (PROTONIX ) 40 MG tablet Take 1 tablet (40 mg total) by mouth daily. 90 tablet 2   sertraline  (ZOLOFT ) 25 MG tablet Take 1 tablet (25 mg total) by mouth daily. 90 tablet 2   sildenafil (VIAGRA) 100 MG tablet Take 1 tablet by mouth See admin instructions.     valsartan  (DIOVAN ) 160 MG tablet TAKE 1 TABLET BY MOUTH EVERY DAY 90 tablet 1   fluticasone  (FLONASE ) 50 MCG/ACT nasal spray Place 1 spray into both nostrils daily for 3 days. 16 g 0   No current facility-administered medications for this encounter.    REVIEW OF SYSTEMS:  On review of systems, the patient reports that he is doing well overall. He denies any chest pain, shortness of breath, cough, fevers, chills, night sweats, unintended weight changes. He denies any bowel disturbances, and denies abdominal pain, nausea or vomiting. He denies any new musculoskeletal or joint aches or pains. His IPSS was 3, indicating minimal urinary symptoms. His SHIM was 11, indicating he has moderate erectile dysfunction. A complete review of systems is obtained and is otherwise negative.    PHYSICAL EXAM:  Wt Readings from Last 3 Encounters:  06/30/23 140 lb (63.5 kg)  05/29/23 140 lb 11.2 oz (63.8 kg)  02/05/23 141 lb 6.4 oz (64.1 kg)   Temp Readings from Last 3 Encounters:  05/29/23 97.6 F (36.4 C) (Temporal)  02/05/23 97.7 F (36.5 C)   01/13/23 97.7 F (36.5 C) (Oral)   BP Readings from Last 3 Encounters:  05/29/23 (!) 144/64  02/05/23 126/82  01/13/23 (!) 172/71   Pulse Readings from Last 3 Encounters:  05/29/23 75  02/05/23 67  01/13/23 (!) 57   Pain Assessment Pain Score: 0-No pain/10  Unable to assess due to telephone consult visit format.   KPS = 100  100 - Normal; no complaints; no evidence of disease. 90   - Able to carry on normal activity; minor signs or symptoms of disease. 80   - Normal activity with effort; some signs or symptoms of disease. 50   - Cares for self; unable to carry on normal activity or to do active work. 60   - Requires occasional assistance, but is able to care for most of his personal needs. 50   - Requires considerable assistance and frequent medical care. 40   - Disabled; requires special care and assistance. 30   - Severely disabled; hospital admission is indicated although death not imminent. 20   - Very sick; hospital admission necessary; active supportive treatment necessary. 10   - Moribund; fatal processes progressing rapidly. 0     - Dead  Karnofsky DA, Abelmann WH, Craver LS and Burchenal [facility] [phone]) The use of the nitrogen mustards in the palliative treatment of carcinoma: with particular reference to bronchogenic carcinoma Cancer 1 634-56  LABORATORY DATA:  Lab Results  Component Value Date   WBC 3.9 (L) 05/29/2023   HGB 13.4 05/29/2023   HCT 42.6 05/29/2023   MCV 89.3 05/29/2023   PLT 170 05/29/2023   Lab Results  Component Value Date   NA 138 05/29/2023   K 3.3 (L) 05/29/2023   CL 104 05/29/2023   CO2 26 05/29/2023   Lab Results  Component Value Date   ALT 14 05/29/2023   AST  46 (H) 05/29/2023   ALKPHOS 89 05/29/2023   BILITOT 0.7 05/29/2023     RADIOGRAPHY: No results found.    IMPRESSION/PLAN: 1. 78 y.o. gentleman with Stage T1c adenocarcinoma of the prostate with Gleason Score of 4+4, and PSA of 10.6. We discussed the patient's workup and  outlined the nature of prostate cancer in this setting. The patient's T stage, Gleason's score, and PSA put him into the high risk group. Accordingly, he is eligible for a variety of potential treatment options including LT-ADT concurrent with 8 weeks of external radiation, 5 weeks of external radiation with an upfront brachytherapy boost, or prostatectomy. We discussed the available radiation techniques, and focused on the details and logistics of delivery. We discussed and outlined the risks, benefits, short and long-term effects associated with radiotherapy and compared and contrasted these with prostatectomy. We discussed the role of SpaceOAR gel in reducing the rectal toxicity associated with radiotherapy. We also detailed the role of ADT in the treatment of high risk prostate cancer and outlined the associated side effects that could be expected with this therapy. He appears to have a good understanding of his disease and our treatment recommendations which are of curative intent.  He was encouraged to ask questions that were answered to his stated satisfaction.  At the conclusion of our conversation, the patient is interested in moving forward with 8 weeks of external beam therapy concurrent with ADT. He has already started ADT on 06/15/2023 so we will share our discussion with Dr. Dulcy Gibney and make arrangements for fiducial markers and SpaceOAR gel placement, in June 2025, prior to simulation, to reduce rectal toxicity from radiotherapy.  He has a family reunion in [place]  coming up in early August and needs to have his treatment completed prior to that trip.  I told him that we would do our absolute best to accommodate getting his treatments started in early June 2025 in an effort to have treatment completed prior to 10/10/2023.  The patient appears to have a good understanding of his disease and our treatment recommendations which are of curative intent and is in agreement with the stated plan.   Therefore, we will move forward with treatment planning accordingly, in anticipation of beginning IMRT in June 2025, approximately 2 months after starting ADT.  We enjoyed meeting him and his wife today and look forward to continuing participate in his care.  They know they are welcome to call at anytime in the interim with any questions or concerns.  Given current concerns for patient exposure during the COVID-19 pandemic, this encounter was conducted via telephone. The patient was notified in advance and was offered a WebEX meeting to allow for face to face communication but unfortunately reported that he did not have the appropriate resources/technology to support such a visit and instead preferred to proceed with telephone consult. The patient has given verbal consent for this type of encounter. The attendants for this meeting include Kenith Payer MD, Ashlyn Bruning PA-C, patient Shawn Fernandez his wife, Adell Hones. During the encounter, Kenith Payer MD and Keitha Pata PA-C were located at [facility] Radiation Oncology Department.  Patient Shawn Fernandez and his wife, Adell Hones, were located at home.   We personally spent 60 minutes in this encounter including chart review, reviewing radiological studies, meeting face-to-face with the patient, entering orders and completing documentation.    Arta Bihari, PA-C    Kenith Payer, MD  [facility]  Radiation Oncology Direct Dial: [phone]  Fax:  [phone] [email or web address]  Skype  LinkedIn   This document serves as a record of services personally performed by Kenith Payer, MD and Keitha Pata, PA-C. It was created on their behalf by Florance Hun, a trained medical scribe. The creation of this record is based on the scribe's personal observations and the provider's statements to them. This document has been checked and approved by the attending provider.

## 2023-06-30 ENCOUNTER — Encounter: Payer: Self-pay | Admitting: Radiation Oncology

## 2023-06-30 ENCOUNTER — Ambulatory Visit
Admission: RE | Admit: 2023-06-30 | Discharge: 2023-06-30 | Disposition: A | Discharge status: Home / Self Care | Source: Ambulatory Visit | Attending: Radiation Oncology | Admitting: Radiation Oncology

## 2023-06-30 ENCOUNTER — Other Ambulatory Visit: Payer: Self-pay

## 2023-06-30 VITALS — Ht 69.0 in | Wt 140.0 lb

## 2023-06-30 DIAGNOSIS — M159 Polyosteoarthritis, unspecified: Secondary | ICD-10-CM | POA: Insufficient documentation

## 2023-06-30 DIAGNOSIS — M7741 Metatarsalgia, right foot: Secondary | ICD-10-CM | POA: Insufficient documentation

## 2023-06-30 DIAGNOSIS — E559 Vitamin D deficiency, unspecified: Secondary | ICD-10-CM | POA: Insufficient documentation

## 2023-06-30 DIAGNOSIS — K219 Gastro-esophageal reflux disease without esophagitis: Secondary | ICD-10-CM | POA: Insufficient documentation

## 2023-06-30 DIAGNOSIS — C61 Malignant neoplasm of prostate: Secondary | ICD-10-CM | POA: Insufficient documentation

## 2023-06-30 DIAGNOSIS — L84 Corns and callosities: Secondary | ICD-10-CM | POA: Insufficient documentation

## 2023-06-30 DIAGNOSIS — K551 Chronic vascular disorders of intestine: Secondary | ICD-10-CM | POA: Insufficient documentation

## 2023-06-30 DIAGNOSIS — N529 Male erectile dysfunction, unspecified: Secondary | ICD-10-CM | POA: Insufficient documentation

## 2023-06-30 DIAGNOSIS — Z9889 Other specified postprocedural states: Secondary | ICD-10-CM | POA: Insufficient documentation

## 2023-06-30 DIAGNOSIS — F419 Anxiety disorder, unspecified: Secondary | ICD-10-CM | POA: Insufficient documentation

## 2023-06-30 DIAGNOSIS — Z860100 Personal history of colon polyps, unspecified: Secondary | ICD-10-CM | POA: Insufficient documentation

## 2023-07-03 NOTE — Progress Notes (Signed)
 Spoke with patient, and patient's wife, via telephone to introduce myself as the prostate nurse navigator and discussed my role.  No barriers to care identified at this time. Patient will proceed with ADT and 8 weeks IMRT.  Patient received Eligard on 4/14, fiducial's and spaceOAR pending at this time.  RN provided my contact information and encouraged patient to reach out with any questions or barriers that may arise.

## 2023-07-06 ENCOUNTER — Other Ambulatory Visit: Payer: Self-pay | Admitting: Urology

## 2023-07-06 ENCOUNTER — Telehealth: Payer: Self-pay

## 2023-07-06 NOTE — Telephone Encounter (Signed)
 Received call from [facility] that pt needs clearance for surgery 10/16/23. Informed her that pt needs to make a NP appt, I advised her to have pt call and I will call as well.  LVM for pt to c/b

## 2023-07-06 NOTE — Telephone Encounter (Signed)
   Pre-operative Risk Assessment    Patient Name: Shawn Fernandez  DOB: 1945-03-20 MRN: [id number]   Date of last office visit: 11/10/18  Date of next office visit: needs NP appt    Request for Surgical Clearance    Procedure:   Lunda Salines Implant and Space Oar   Date of Surgery:  Clearance 10/16/23                                Surgeon:  Dr. Sharri Dee Group or Practice Name:  [facility]   Phone number:  [phone] Fax number:  [phone]    Type of Clearance Requested:   - Medical  - Pharmacy:  Hold Aspirin  5 days prior    Type of Anesthesia:  MAC   Additional requests/questions:    Virgie Griffith   07/06/2023, 3:06 PM

## 2023-07-08 ENCOUNTER — Other Ambulatory Visit: Payer: Self-pay | Admitting: Urology

## 2023-07-08 DIAGNOSIS — C61 Malignant neoplasm of prostate: Secondary | ICD-10-CM

## 2023-07-14 ENCOUNTER — Encounter: Payer: Self-pay | Admitting: Hematology

## 2023-07-20 ENCOUNTER — Other Ambulatory Visit: Payer: Self-pay | Admitting: Physician Assistant

## 2023-08-01 ENCOUNTER — Encounter: Payer: Self-pay | Admitting: Hematology

## 2023-08-04 DIAGNOSIS — I10 Essential (primary) hypertension: Secondary | ICD-10-CM | POA: Diagnosis not present

## 2023-08-04 DIAGNOSIS — J439 Emphysema, unspecified: Secondary | ICD-10-CM | POA: Diagnosis not present

## 2023-08-04 DIAGNOSIS — I272 Pulmonary hypertension, unspecified: Secondary | ICD-10-CM | POA: Diagnosis not present

## 2023-08-04 DIAGNOSIS — E785 Hyperlipidemia, unspecified: Secondary | ICD-10-CM | POA: Diagnosis not present

## 2023-08-04 DIAGNOSIS — Z008 Encounter for other general examination: Secondary | ICD-10-CM | POA: Diagnosis not present

## 2023-08-04 DIAGNOSIS — F17210 Nicotine dependence, cigarettes, uncomplicated: Secondary | ICD-10-CM | POA: Diagnosis not present

## 2023-08-04 DIAGNOSIS — K219 Gastro-esophageal reflux disease without esophagitis: Secondary | ICD-10-CM | POA: Diagnosis not present

## 2023-08-04 DIAGNOSIS — E039 Hypothyroidism, unspecified: Secondary | ICD-10-CM | POA: Diagnosis not present

## 2023-08-04 DIAGNOSIS — C61 Malignant neoplasm of prostate: Secondary | ICD-10-CM | POA: Diagnosis not present

## 2023-08-06 ENCOUNTER — Encounter: Payer: Self-pay | Admitting: Internal Medicine

## 2023-08-06 ENCOUNTER — Ambulatory Visit: Payer: Medicare Other | Admitting: Internal Medicine

## 2023-08-06 VITALS — BP 150/74 | HR 60 | Temp 98.6°F | Ht 69.0 in | Wt 149.0 lb

## 2023-08-06 DIAGNOSIS — J069 Acute upper respiratory infection, unspecified: Secondary | ICD-10-CM | POA: Diagnosis not present

## 2023-08-06 DIAGNOSIS — I119 Hypertensive heart disease without heart failure: Secondary | ICD-10-CM | POA: Diagnosis not present

## 2023-08-06 DIAGNOSIS — E039 Hypothyroidism, unspecified: Secondary | ICD-10-CM | POA: Diagnosis not present

## 2023-08-06 DIAGNOSIS — D649 Anemia, unspecified: Secondary | ICD-10-CM

## 2023-08-06 DIAGNOSIS — C61 Malignant neoplasm of prostate: Secondary | ICD-10-CM | POA: Diagnosis not present

## 2023-08-06 DIAGNOSIS — F17200 Nicotine dependence, unspecified, uncomplicated: Secondary | ICD-10-CM | POA: Diagnosis not present

## 2023-08-06 DIAGNOSIS — I270 Primary pulmonary hypertension: Secondary | ICD-10-CM

## 2023-08-06 MED ORDER — GUAIFENESIN-CODEINE 100-10 MG/5ML PO SOLN: 5.0000 mL | Freq: Four times a day (QID) | ORAL | 0 refills | Status: DC | PRN

## 2023-08-06 NOTE — Progress Notes (Signed)
 I,Victoria T Basil Lim, CMA,acting as a Neurosurgeon for Smiley Dung, MD.,have documented all relevant documentation on the behalf of Smiley Dung, MD,as directed by  Smiley Dung, MD while in the presence of Smiley Dung, MD.  Subjective:  Patient ID: Shawn Fernandez , male    DOB: Mar 03, 1946 , 78 y.o.   MRN: [id number]  Chief Complaint  Patient presents with   Hypertension    Patient presents today for bp & thyroid  follow up. He reports compliance with medications. Denies headache, chest pain & sob.  He complains of constant coughing / sneezing initially started last week . He went to the [place] urgent care last Saturday. Diagnosed with upper respiratory infection. He was treated, he admits the medication Tessalon  Perles has helped some. He still experiences chest congestion. Denies fever/ chills. He asks for a cough syrup.  He wants medication for experiencing hot flashes.   Hypothyroidism    HPI Discussed the use of AI scribe software for clinical note transcription with the patient, who gave verbal consent to proceed.  History of Present Illness Shawn Fernandez is a 78 year old male with hypertension and hypothyroidism who presents for a blood pressure and thyroid  check, and concerns about a recent upper respiratory infection. He is accompanied by his wife.  He visited urgent care at the [place] last Saturday due to symptoms of coughing and was diagnosed with an upper respiratory infection. He was prescribed Tessalon  Perles, a nasal spray, and another white pill for coughing. He has been coughing up yellow and creamy mucus but denies fever or chills. The cough is worse at night, significantly affecting his sleep.  He is currently taking valsartan  160 mg daily for hypertension, sertraline  25 mg daily for mood, Protonix  40 mg daily, Synthroid  100 mcg daily for hypothyroidism, hydralazine  three times a day, atorvastatin  10 mg daily except Sundays, and amlodipine  10 mg daily.   He is scheduled  to see a urologist in August for prostate treatment and has been experiencing hot flashes, which he attributes to a shot he received from the urologist. His last thyroid  check was in December, after which his medication was adjusted.   Hypertension This is a chronic problem. The current episode started more than 1 year ago. The problem has been gradually improving since onset. The problem is controlled. Pertinent negatives include no blurred vision. Risk factors for coronary artery disease include male gender and dyslipidemia.     Past Medical History:  Diagnosis Date   Blood clot in abdominal vein    treated at [facility]   Carotid artery disease (HCC)    Dyslipidemia    FHx: heart disease    History of blood transfusion 8-9 yrs ago   HTN (hypertension)    Peripheral vascular disease (HCC)      Family History  Problem Relation Age of Onset   Heart disease Brother    Heart disease Mother    Cancer Father        ? type     Current Outpatient Medications:    albuterol  (VENTOLIN  HFA) 108 (90 Base) MCG/ACT inhaler, TAKE 2 PUFFS BY MOUTH EVERY 6 HOURS AS NEEDED FOR WHEEZE OR SHORTNESS OF BREATH, Disp: 8.5 each, Rfl: 1   amLODipine  (NORVASC ) 10 MG tablet, TAKE 1 TABLET BY MOUTH EVERYDAY AT BEDTIME, Disp: 90 tablet, Rfl: 2   aspirin  EC 81 MG tablet, Take 81 mg by mouth daily., Disp: , Rfl:    atorvastatin  (LIPITOR) 10 MG tablet,  One tab po daily, except Sundays Start 6/10, Disp: 30 tablet, Rfl: 11   Cholecalciferol (VITAMIN D3) 1000 units CAPS, Take 1 capsule by mouth daily., Disp: , Rfl:    diclofenac  (VOLTAREN ) 75 MG EC tablet, TAKE 1 TABLET BY MOUTH TWICE A DAY, Disp: 30 tablet, Rfl: 2   hydrALAZINE  (APRESOLINE ) 25 MG tablet, ONE TABLET BY MOUTH AT BREAKFAST LUNCH AND BEDTIME., Disp: 270 tablet, Rfl: 2   iron  polysaccharides (NIFEREX) 150 MG capsule, Take 1 capsule (150 mg total) by mouth daily., Disp: 90 capsule, Rfl: 2   levothyroxine  (SYNTHROID ) 100 MCG tablet, Take 100 mcg by mouth  daily before breakfast., Disp: , Rfl:    Multiple Vitamin (MULTIVITAMIN WITH MINERALS) TABS tablet, Take 1 tablet by mouth daily., Disp: , Rfl:    pantoprazole  (PROTONIX ) 40 MG tablet, Take 1 tablet (40 mg total) by mouth daily., Disp: 90 tablet, Rfl: 2   sertraline  (ZOLOFT ) 25 MG tablet, Take 1 tablet (25 mg total) by mouth daily., Disp: 90 tablet, Rfl: 2   sildenafil (VIAGRA) 100 MG tablet, Take 1 tablet by mouth See admin instructions., Disp: , Rfl:    valsartan  (DIOVAN ) 160 MG tablet, TAKE 1 TABLET BY MOUTH EVERY DAY, Disp: 90 tablet, Rfl: 1   fluticasone  (FLONASE ) 50 MCG/ACT nasal spray, Place 1 spray into both nostrils daily for 3 days., Disp: 16 g, Rfl: 0   guaiFENesin -codeine  100-10 MG/5ML syrup, Take 5 mLs by mouth every 6 (six) hours as needed for cough., Disp: 80 mL, Rfl: 0   Allergies  Allergen Reactions   Lisinopril Cough     Review of Systems  Constitutional: Negative.   HENT:  Positive for rhinorrhea.   Eyes:  Negative for blurred vision.  Respiratory:  Positive for cough.   Cardiovascular: Negative.   Gastrointestinal: Negative.   Skin: Negative.   Allergic/Immunologic: Negative.   Neurological: Negative.   Hematological: Negative.      Today's Vitals   08/06/23 1010 08/06/23 1035  BP: (!) 140/82 (!) 150/74  Pulse: 60   Temp: 98.6 F (37 C)   SpO2: 98%   Weight: 149 lb (67.6 kg)   Height: 5' 9 (1.753 m)    Body mass index is 22 kg/m.  Wt Readings from Last 3 Encounters:  08/06/23 149 lb (67.6 kg)  06/30/23 140 lb (63.5 kg)  05/29/23 140 lb 11.2 oz (63.8 kg)    BP Readings from Last 3 Encounters:  08/06/23 (!) 150/74  05/29/23 (!) 144/64  02/05/23 126/82     Objective:  Physical Exam Vitals and nursing note reviewed.  Constitutional:      Appearance: Normal appearance.  HENT:     Head: Normocephalic and atraumatic.     Mouth/Throat:     Comments: masked  Eyes:     Extraocular Movements: Extraocular movements intact.    Cardiovascular:      Rate and Rhythm: Normal rate and regular rhythm.     Heart sounds: Normal heart sounds.  Pulmonary:     Effort: Pulmonary effort is normal.     Breath sounds: Normal breath sounds.   Musculoskeletal:     Cervical back: Normal range of motion.   Skin:    General: Skin is warm.   Neurological:     General: No focal deficit present.     Mental Status: He is alert.   Psychiatric:        Mood and Affect: Mood normal.       Assessment And Plan:  Hypertensive  heart disease without heart failure Assessment & Plan: Chronic, uncontrolled. Blood pressure elevated at 150/74, possibly due to decongestant use and situational factors. Requires monitoring. - Schedule nurse visit in two weeks for blood pressure recheck and medication adjustment if necessary.  Orders: -     CMP14+EGFR -     Lipid panel  Acquired hypothyroidism Assessment & Plan: Last thyroid  check indicated inadequate dosage. Symptoms may suggest imbalance. Reassessment needed. - Recheck thyroid  function tests.  Orders: -     TSH + free T4  Viral upper respiratory tract infection Assessment & Plan: Cough likely due to post-nasal drip or throat irritation. Neoral recommended for blood pressure-friendly symptom management. - Prescribe Norel AD with bid dosing prn. - Discontinue DayQuil and NyQuil. - Continue Tessalon  Perles if needed.   Tobacco use disorder Assessment & Plan: He is currently practicing abstinence from tobacco use. Last LDCT was performed earlier this year. He is due for repeat study later this month.  Orders: -     CT CHEST LUNG CANCER SCREENING LOW DOSE WO CONTRAST; Future  Malignant neoplasm of prostate [facility]) Assessment & Plan: Under urologist care, awaiting treatment. Hot flashes may be Lupron side effect. Coordination with urology ongoing. - Continue with scheduled Urology follow-up and treatment plan in August.   Other orders -     guaiFENesin -Codeine ; Take 5 mLs by mouth every 6  (six) hours as needed for cough.  Dispense: 80 mL; Refill: 0   Return in 2 weeks (on 08/20/2023), or [place] bp check, for 4 month bpc..  Patient was given opportunity to ask questions. Patient verbalized understanding of the plan and was able to repeat key elements of the plan. All questions were answered to their satisfaction.   I, Smiley Dung, MD, have reviewed all documentation for this visit. The documentation on 08/06/23 for the exam, diagnosis, procedures, and orders are all accurate and complete.   IF YOU HAVE BEEN REFERRED TO A SPECIALIST, IT MAY TAKE 1-2 WEEKS TO SCHEDULE/PROCESS THE REFERRAL. IF YOU HAVE NOT HEARD FROM US /SPECIALIST IN TWO WEEKS, PLEASE GIVE US  A CALL AT [phone] X [phone].   THE PATIENT IS ENCOURAGED TO PRACTICE SOCIAL DISTANCING DUE TO THE COVID-19 PANDEMIC.

## 2023-08-06 NOTE — Patient Instructions (Signed)
 Hypertension, Adult Hypertension is another name for high blood pressure. High blood pressure forces your heart to work harder to pump blood. This can cause problems over time. There are two numbers in a blood pressure reading. There is a top number (systolic) over a bottom number (diastolic). It is best to have a blood pressure that is below 120/80. What are the causes? The cause of this condition is not known. Some other conditions can lead to high blood pressure. What increases the risk? Some lifestyle factors can make you more likely to develop high blood pressure: Smoking. Not getting enough exercise or physical activity. Being overweight. Having too much fat, sugar, calories, or salt (sodium) in your diet. Drinking too much alcohol. Other risk factors include: Having any of these conditions: Heart disease. Diabetes. High cholesterol. Kidney disease. Obstructive sleep apnea. Having a family history of high blood pressure and high cholesterol. Age. The risk increases with age. Stress. What are the signs or symptoms? High blood pressure may not cause symptoms. Very high blood pressure (hypertensive crisis) may cause: Headache. Fast or uneven heartbeats (palpitations). Shortness of breath. Nosebleed. Vomiting or feeling like you may vomit (nauseous). Changes in how you see. Very bad chest pain. Feeling dizzy. Seizures. How is this treated? This condition is treated by making healthy lifestyle changes, such as: Eating healthy foods. Exercising more. Drinking less alcohol. Your doctor may prescribe medicine if lifestyle changes do not help enough and if: Your top number is above 130. Your bottom number is above 80. Your personal target blood pressure may vary. Follow these instructions at home: Eating and drinking  If told, follow the DASH eating plan. To follow this plan: Fill one half of your plate at each meal with fruits and vegetables. Fill one fourth of your plate  at each meal with whole grains. Whole grains include whole-wheat pasta, brown rice, and whole-grain bread. Eat or drink low-fat dairy products, such as skim milk or low-fat yogurt. Fill one fourth of your plate at each meal with low-fat (lean) proteins. Low-fat proteins include fish, chicken without skin, eggs, beans, and tofu. Avoid fatty meat, cured and processed meat, or chicken with skin. Avoid pre-made or processed food. Limit the amount of salt in your diet to less than 1,500 mg each day. Do not drink alcohol if: Your doctor tells you not to drink. You are pregnant, may be pregnant, or are planning to become pregnant. If you drink alcohol: Limit how much you have to: 0-1 drink a day for women. 0-2 drinks a day for men. Know how much alcohol is in your drink. In the [place] one drink equals one 12 oz bottle of beer (355 mL), one 5 oz glass of wine (148 mL), or one 1 oz glass of hard liquor (44 mL). Lifestyle  Work with your doctor to stay at a healthy weight or to lose weight. Ask your doctor what the best weight is for you. Get at least 30 minutes of exercise that causes your heart to beat faster (aerobic exercise) most days of the week. This may include walking, swimming, or biking. Get at least 30 minutes of exercise that strengthens your muscles (resistance exercise) at least 3 days a week. This may include lifting weights or doing Pilates. Do not smoke or use any products that contain nicotine or tobacco. If you need help quitting, ask your doctor. Check your blood pressure at home as told by your doctor. Keep all follow-up visits. Medicines Take over-the-counter and prescription medicines  only as told by your doctor. Follow directions carefully. Do not skip doses of blood pressure medicine. The medicine does not work as well if you skip doses. Skipping doses also puts you at risk for problems. Ask your doctor about side effects or reactions to medicines that you should watch  for. Contact a doctor if: You think you are having a reaction to the medicine you are taking. You have headaches that keep coming back. You feel dizzy. You have swelling in your ankles. You have trouble with your vision. Get help right away if: You get a very bad headache. You start to feel mixed up (confused). You feel weak or numb. You feel faint. You have very bad pain in your: Chest. Belly (abdomen). You vomit more than once. You have trouble breathing. These symptoms may be an emergency. Get help right away. Call 911. Do not wait to see if the symptoms will go away. Do not drive yourself to the hospital. Summary Hypertension is another name for high blood pressure. High blood pressure forces your heart to work harder to pump blood. For most people, a normal blood pressure is less than 120/80. Making healthy choices can help lower blood pressure. If your blood pressure does not get lower with healthy choices, you may need to take medicine. This information is not intended to replace advice given to you by your health care provider. Make sure you discuss any questions you have with your health care provider. Document Revised: 12/06/2020 Document Reviewed: 12/06/2020 Elsevier Patient Education  2024 [employer].

## 2023-08-10 LAB — LIPID PANEL
Chol/HDL Ratio: 2.6 ratio (ref 0.0–5.0)
Cholesterol, Total: 196 mg/dL (ref 100–199)
HDL: 76 mg/dL (ref >39)
LDL Chol Calc (NIH): 107 mg/dL — ABNORMAL HIGH (ref 0–99)
Triglycerides: 74 mg/dL (ref 0–149)
VLDL Cholesterol Cal: 13 mg/dL (ref 5–40)

## 2023-08-10 LAB — CMP14+EGFR
ALT: 17 IU/L (ref 0–44)
AST: 32 IU/L (ref 0–40)
Albumin: 4.5 g/dL (ref 3.8–4.8)
Alkaline Phosphatase: 124 IU/L — ABNORMAL HIGH (ref 44–121)
BUN/Creatinine Ratio: 15 (ref 10–24)
BUN: 19 mg/dL (ref 8–27)
Bilirubin Total: 0.2 mg/dL (ref 0.0–1.2)
CO2: 19 mmol/L — ABNORMAL LOW (ref 20–29)
Calcium: 9.8 mg/dL (ref 8.6–10.2)
Chloride: 104 mmol/L (ref 96–106)
Creatinine, Ser: 1.27 mg/dL (ref 0.76–1.27)
Globulin, Total: 2.8 g/dL (ref 1.5–4.5)
Glucose: 93 mg/dL (ref 70–99)
Potassium: 4.1 mmol/L (ref 3.5–5.2)
Sodium: 140 mmol/L (ref 134–144)
Total Protein: 7.3 g/dL (ref 6.0–8.5)
eGFR: 58 mL/min/{1.73_m2} — ABNORMAL LOW (ref >59)

## 2023-08-10 LAB — TSH+FREE T4
Free T4: 1.38 ng/dL (ref 0.82–1.77)
TSH: 9.25 u[IU]/mL — ABNORMAL HIGH (ref 0.450–4.500)

## 2023-08-11 ENCOUNTER — Ambulatory Visit: Payer: Self-pay | Admitting: Internal Medicine

## 2023-08-11 DIAGNOSIS — E039 Hypothyroidism, unspecified: Secondary | ICD-10-CM

## 2023-08-16 DIAGNOSIS — J069 Acute upper respiratory infection, unspecified: Secondary | ICD-10-CM | POA: Insufficient documentation

## 2023-08-16 NOTE — Assessment & Plan Note (Signed)
 Cough likely due to post-nasal drip or throat irritation. Neoral recommended for blood pressure-friendly symptom management. - Prescribe Norel AD with bid dosing prn. - Discontinue DayQuil and NyQuil. - Continue Tessalon  Perles if needed.

## 2023-08-16 NOTE — Assessment & Plan Note (Addendum)
 He is currently practicing abstinence from tobacco use. Last LDCT was performed earlier this year. He is due for repeat study later this month.

## 2023-08-16 NOTE — Assessment & Plan Note (Signed)
 Most recent labs reviewed. He denies having any gingival and/or rectal bleeding. I will check labs as below.

## 2023-08-16 NOTE — Assessment & Plan Note (Signed)
 Chronic, uncontrolled. Blood pressure elevated at 150/74, possibly due to decongestant use and situational factors. Requires monitoring. - Schedule nurse visit in two weeks for blood pressure recheck and medication adjustment if necessary.

## 2023-08-16 NOTE — Assessment & Plan Note (Signed)
 Under urologist care, awaiting treatment. Hot flashes may be Lupron side effect. Coordination with urology ongoing. - Continue with scheduled Urology follow-up and treatment plan in August.

## 2023-08-16 NOTE — Assessment & Plan Note (Signed)
 Last thyroid  check indicated inadequate dosage. Symptoms may suggest imbalance. Reassessment needed. - Recheck thyroid  function tests.

## 2023-08-17 ENCOUNTER — Other Ambulatory Visit: Payer: Self-pay | Admitting: Internal Medicine

## 2023-08-17 DIAGNOSIS — F17200 Nicotine dependence, unspecified, uncomplicated: Secondary | ICD-10-CM

## 2023-08-18 ENCOUNTER — Ambulatory Visit

## 2023-08-20 ENCOUNTER — Ambulatory Visit: Admitting: Cardiology

## 2023-08-21 ENCOUNTER — Ambulatory Visit

## 2023-08-30 DIAGNOSIS — R059 Cough, unspecified: Secondary | ICD-10-CM | POA: Diagnosis not present

## 2023-08-30 DIAGNOSIS — J988 Other specified respiratory disorders: Secondary | ICD-10-CM | POA: Diagnosis not present

## 2023-08-30 DIAGNOSIS — B9689 Other specified bacterial agents as the cause of diseases classified elsewhere: Secondary | ICD-10-CM | POA: Diagnosis not present

## 2023-09-02 ENCOUNTER — Telehealth: Payer: Self-pay | Admitting: *Deleted

## 2023-09-02 ENCOUNTER — Other Ambulatory Visit

## 2023-09-02 ENCOUNTER — Telehealth: Payer: Self-pay

## 2023-09-02 NOTE — Telephone Encounter (Signed)
 RN return call to Mrs. Frazer she and Mr. Sciandra had concerns about letter they received for an upcoming procedure Danae Seed implant) on 10/16/2023.  Mrs. Schubert expressed that Mr. Anastacio didn't want any seed implant procedure.  RN explained that Gold Seed implant is the fiducial markers and SpaceOAR procedure. This was confirmed by Sabra Rusk, PA-C that the procedure he's thinking about is brachytherapy and that is NOT the procedure scheduled on 10/16/2023.  Mrs. Grauberger verbalized understanding but would like a call back around 3 pm so that Mr. Shadowens can get an understanding of the procedure.   We will give him a call back at 3 pm to explain procedure.  Mrs. Shostak was appreciative of the cl and explanation and is looking forward to call back to ease Mr. Mcelrath mind.

## 2023-09-02 NOTE — Telephone Encounter (Signed)
 Called patient to answer questions, lvm for a return call

## 2023-09-07 ENCOUNTER — Ambulatory Visit: Admitting: Cardiovascular Disease

## 2023-09-07 ENCOUNTER — Other Ambulatory Visit

## 2023-09-08 ENCOUNTER — Telehealth: Payer: Self-pay | Admitting: *Deleted

## 2023-09-08 NOTE — Telephone Encounter (Signed)
 RETURNED PATIENT'S PHONE CALL, LVM FOR A RETURN CALL

## 2023-09-09 ENCOUNTER — Encounter: Payer: Self-pay | Admitting: Hematology

## 2023-09-09 ENCOUNTER — Telehealth: Payer: Self-pay | Admitting: *Deleted

## 2023-09-09 NOTE — Telephone Encounter (Signed)
Returned patient's phone call, lvm for a return call 

## 2023-09-10 ENCOUNTER — Encounter: Payer: Self-pay | Admitting: Hematology

## 2023-09-10 ENCOUNTER — Telehealth: Payer: Self-pay | Admitting: *Deleted

## 2023-09-10 NOTE — Telephone Encounter (Signed)
 RETURNED PATIENT'S PHONE CALL, LVM FOR A RETURN CALL

## 2023-09-14 ENCOUNTER — Inpatient Hospital Stay
Admission: RE | Admit: 2023-09-14 | Discharge: 2023-09-14 | Discharge status: Home / Self Care | Source: Ambulatory Visit | Attending: Internal Medicine | Admitting: Internal Medicine

## 2023-09-14 ENCOUNTER — Encounter: Payer: Self-pay | Admitting: Hematology

## 2023-09-14 DIAGNOSIS — Z720 Tobacco use: Secondary | ICD-10-CM | POA: Diagnosis not present

## 2023-09-14 DIAGNOSIS — F17200 Nicotine dependence, unspecified, uncomplicated: Secondary | ICD-10-CM

## 2023-09-14 DIAGNOSIS — J8410 Pulmonary fibrosis, unspecified: Secondary | ICD-10-CM | POA: Diagnosis not present

## 2023-09-16 ENCOUNTER — Ambulatory Visit: Payer: Medicare (Managed Care) | Admitting: Cardiovascular Disease

## 2023-09-16 ENCOUNTER — Ambulatory Visit: Admitting: Cardiovascular Disease

## 2023-09-20 ENCOUNTER — Ambulatory Visit: Payer: Self-pay | Admitting: Internal Medicine

## 2023-09-24 ENCOUNTER — Other Ambulatory Visit: Payer: Self-pay | Admitting: Internal Medicine

## 2023-09-24 DIAGNOSIS — I10 Essential (primary) hypertension: Secondary | ICD-10-CM

## 2023-09-28 ENCOUNTER — Encounter: Payer: Self-pay | Admitting: Cardiovascular Disease

## 2023-09-28 ENCOUNTER — Ambulatory Visit: Payer: Medicare (Managed Care) | Attending: Cardiovascular Disease | Admitting: Cardiovascular Disease

## 2023-09-28 VITALS — BP 128/76 | HR 71 | Ht 69.0 in | Wt 147.6 lb

## 2023-09-28 DIAGNOSIS — Z01818 Encounter for other preprocedural examination: Secondary | ICD-10-CM | POA: Diagnosis not present

## 2023-09-28 DIAGNOSIS — I6523 Occlusion and stenosis of bilateral carotid arteries: Secondary | ICD-10-CM | POA: Diagnosis not present

## 2023-09-28 DIAGNOSIS — I10 Essential (primary) hypertension: Secondary | ICD-10-CM

## 2023-09-28 DIAGNOSIS — I739 Peripheral vascular disease, unspecified: Secondary | ICD-10-CM

## 2023-09-28 DIAGNOSIS — E782 Mixed hyperlipidemia: Secondary | ICD-10-CM

## 2023-09-28 NOTE — Assessment & Plan Note (Signed)
 History of essential hypertension with blood pressure measured today at 128/76.  He is on amlodipine , hydralazine  and valsartan .

## 2023-09-28 NOTE — Patient Instructions (Addendum)
 Medication Instructions:  Your physician recommends that you continue on your current medications as directed. Please refer to the Current Medication list given to you today.  *If you need a refill on your cardiac medications before your next appointment, please call your pharmacy*  Testing/Procedures: Your physician has requested that you have a carotid duplex. This test is an ultrasound of the carotid arteries in your neck. It looks at blood flow through these arteries that supply the brain with blood. Allow one hour for this exam. There are no restrictions or special instructions. This will take place at [address], 4th floor **To do in September**  Please note: We ask at that you not bring children with you during ultrasound (echo/ vascular) testing. Due to room size and safety concerns, children are not allowed in the ultrasound rooms during exams. Our front office staff cannot provide observation of children in our lobby area while testing is being conducted. An adult accompanying a patient to their appointment will only be allowed in the ultrasound room at the discretion of the ultrasound technician under special circumstances. We apologize for any inconvenience.   Dr. Court has ordered a CT coronary calcium  score.   Test locations:  [facility] at [facility]  [facility] at [facility]  This is $99 out of pocket.   Coronary CalciumScan A coronary calcium  scan is an imaging test used to look for deposits of calcium  and other fatty materials (plaques) in the inner lining of the blood vessels of the heart (coronary arteries). These deposits of calcium  and plaques can partly clog and narrow the coronary arteries without producing any symptoms or warning signs. This puts a person at risk for a heart attack. This test can detect these deposits before symptoms develop. Tell a health  care provider about: Any allergies you have. All medicines you are taking, including vitamins, herbs, eye drops, creams, and over-the-counter medicines. Any problems you or family members have had with anesthetic medicines. Any blood disorders you have. Any surgeries you have had. Any medical conditions you have. Whether you are pregnant or may be pregnant. What are the risks? Generally, this is a safe procedure. However, problems may occur, including: Harm to a pregnant woman and her unborn baby. This test involves the use of radiation. Radiation exposure can be dangerous to a pregnant woman and her unborn baby. If you are pregnant, you generally should not have this procedure done. Slight increase in the risk of cancer. This is because of the radiation involved in the test. What happens before the procedure? No preparation is needed for this procedure. What happens during the procedure? You will undress and remove any jewelry around your neck or chest. You will put on a hospital gown. Sticky electrodes will be placed on your chest. The electrodes will be connected to an electrocardiogram (ECG) machine to record a tracing of the electrical activity of your heart. A CT scanner will take pictures of your heart. During this time, you will be asked to lie still and hold your breath for 2-3 seconds while a picture of your heart is being taken. The procedure may vary among health care providers and hospitals. What happens after the procedure? You can get dressed. You can return to your normal activities. It is up to you to get the results of your test. Ask your health care provider, or the department that is doing the test, when  your results will be ready. Summary A coronary calcium  scan is an imaging test used to look for deposits of calcium  and other fatty materials (plaques) in the inner lining of the blood vessels of the heart (coronary arteries). Generally, this is a safe procedure. Tell  your health care provider if you are pregnant or may be pregnant. No preparation is needed for this procedure. A CT scanner will take pictures of your heart. You can return to your normal activities after the scan is done. This information is not intended to replace advice given to you by your health care provider. Make sure you discuss any questions you have with your health care provider. Document Released: 08/16/2007 Document Revised: 01/07/2016 Document Reviewed: 01/07/2016 Elsevier Interactive Patient Education  2017 [employer].   Follow-Up: At [facility], you and your health needs are our priority.  As part of our continuing mission to provide you with exceptional heart care, our providers are all part of one team.  This team includes your primary Cardiologist (physician) and Advanced Practice Providers or APPs (Physician Assistants and Nurse Practitioners) who all work together to provide you with the care you need, when you need it.  Your next appointment:   12 month(s)  Provider:   Dorn Lesches, MD    We recommend signing up for the patient portal called MyChart.  Sign up information is provided on this After Visit Summary.  MyChart is used to connect with patients for Virtual Visits (Telemedicine).  Patients are able to view lab/test results, encounter notes, upcoming appointments, etc.  Non-urgent messages can be sent to your provider as well.   To learn more about what you can do with MyChart, go to [email or web address].

## 2023-09-28 NOTE — Assessment & Plan Note (Signed)
 History of hyperlipidemia on low-dose statin therapy with lipid profile performed 08/06/2023 revealing total cholesterol 196, LDL 107 and HDL 76.  Given his PAD I am going to get a coronary calcium  score to risk ratified.

## 2023-09-28 NOTE — Assessment & Plan Note (Signed)
 Mild to moderate bilateral ICA stenosis by duplex ultrasound performed 12/01/2022.  This will be repeated on an annual basis.

## 2023-09-28 NOTE — Assessment & Plan Note (Signed)
 History of peripheral arterial disease with ABIs in the 0.7 range bilaterally.  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  He has 60% stenosis in his right external iliac artery without a pullback gradient and diffuse SFA disease as well as tibial disease with two-vessel runoff on the right and 1 on the left.  He was on Pletal at that time and currently denies claudication.

## 2023-09-28 NOTE — Progress Notes (Signed)
 09/28/2023 Shawn Fernandez   1945-12-20  [id number]  Primary Physician Jarold Medici, MD Primary Cardiologist: Dorn JINNY Lesches MD FACP, [place], [place], [place]  HPI:  Shawn Fernandez is a 78 y.o.   thin appearing married African American male father of 1, grandfather to 2 grandchildren who I I last saw in the office 11/10/2018.  He is a retired [employer] architect, originally referred to me by Dr. Theo for peripheral vascular evaluation and claudication. His risk factors include hypertension, hyperlipidemia, and off and on tobacco abuse as well as family history. 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 He also complained of right greater than left lower extremity claudication at less than 1 block. He had Dopplers in our office which showed ABIs in the 0.7 range bilaterally. 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 He had 60% stenosis in his right external iliac artery without a pullback gradient and diffuse SFA disease in the 50% range, as well as tibial disease with 2-vessel runoff on the right and 1 on the right. He is on Pletal. He currently denies claudication. There were no focal lesions which I could identify as culprit lesions warranting percutaneous intervention. hhe did stop smoking approximately one year ago. He denies right leg; claudication but does complain of left calf claudication. He is on Pletal. He has 1 vessel runoff below the knee on the left peroneal which has 50-60% proximal stenosis.   I last saw him in the office 5 years ago he continues to do well.  He stopped smoking 5 years ago.  He denies chest pain, shortness of breath or claudication.  He did have a 2D echocardiogram performed 03/26/2023 which was essentially normal and carotid Dopplers performed 12/01/2022 that showed mild to moderate bilateral ICA stenosis.  He apparently had prostate cancer and needs radioactive  seed implantation 10/16/2023 which he is cleared for low risk.     Current Meds  Medication Sig   albuterol  (VENTOLIN  HFA) 108 (90 Base) MCG/ACT inhaler TAKE 2 PUFFS BY MOUTH EVERY 6 HOURS AS NEEDED FOR WHEEZE OR SHORTNESS OF BREATH   amLODipine  (NORVASC ) 10 MG tablet TAKE 1 TABLET BY MOUTH EVERYDAY AT BEDTIME   aspirin  EC 81 MG tablet Take 81 mg by mouth daily.   atorvastatin  (LIPITOR) 10 MG tablet One tab po daily, except Sundays Start 6/10   Cholecalciferol (VITAMIN D3) 1000 units CAPS Take 1 capsule by mouth daily.   diclofenac  (VOLTAREN ) 75 MG EC tablet TAKE 1 TABLET BY MOUTH TWICE A DAY   fluticasone  (FLONASE ) 50 MCG/ACT nasal spray Place 1 spray into both nostrils daily for 3 days.   guaiFENesin -codeine  100-10 MG/5ML syrup Take 5 mLs by mouth every 6 (six) hours as needed for cough.   hydrALAZINE  (APRESOLINE ) 25 MG tablet TAKE 1 TABLET (25 MG TOTAL) BY MOUTH 3 (THREE) TIMES DAILY. ONE TABLET BY MOUTH AT BREAKFAST LUNCH AND BEDTIME.   iron  polysaccharides (NIFEREX) 150 MG capsule Take 1 capsule (150 mg total) by mouth daily.   levothyroxine  (SYNTHROID ) 100 MCG tablet Take 100 mcg by mouth daily before breakfast.   Multiple Vitamin (MULTIVITAMIN WITH MINERALS) TABS tablet Take 1 tablet by mouth daily.   pantoprazole  (PROTONIX ) 40 MG tablet Take 1 tablet (40 mg total) by mouth daily.   sertraline  (ZOLOFT ) 25 MG tablet Take 1 tablet (25 mg total) by mouth daily.   sildenafil (VIAGRA) 100 MG  tablet Take 1 tablet by mouth See admin instructions.   valsartan  (DIOVAN ) 160 MG tablet TAKE 1 TABLET BY MOUTH EVERY DAY     Allergies  Allergen Reactions   Lisinopril Cough    Social History   Socioeconomic History   Marital status: Married    Spouse name: Not on file   Number of children: Not on file   Years of education: Not on file   Highest education level: Not on file  Occupational History   Occupation: retired  Tobacco Use   Smoking status: Former    Current packs/day: 0.00     Average packs/day: 0.8 packs/day for 47.0 years (35.2 ttl pk-yrs)    Types: Cigarettes    Start date: 07/07/1962    Quit date: 07/01/2009    Years since quitting: 14.2   Smokeless tobacco: Never   Tobacco comments:    0.75pack x 30 years, quit in 2011, resumed smoking in 2012, one pack lasts a week    Quit 2 weeks ago  Vaping Use   Vaping status: Never Used  Substance and Sexual Activity   Alcohol use: Yes    Comment: only occ   Drug use: No   Sexual activity: Yes  Other Topics Concern   Not on file  Social History Narrative   Not on file   Social Drivers of Health   Financial Resource Strain: Low Risk  (06/04/2022)   Overall Financial Resource Strain (CARDIA)    Difficulty of Paying Living Expenses: Not hard at all  Food Insecurity: No Food Insecurity (06/04/2022)   Hunger Vital Sign    Worried About Running Out of Food in the Last Year: Never true    Ran Out of Food in the Last Year: Never true  Transportation Needs: No Transportation Needs (06/04/2022)   PRAPARE - Administrator, Civil Service (Medical): No    Lack of Transportation (Non-Medical): No  Physical Activity: Inactive (06/04/2022)   Exercise Vital Sign    Days of Exercise per Week: 0 days    Minutes of Exercise per Session: 0 min  Stress: No Stress Concern Present (06/04/2022)   [employer] of Occupational Health - Occupational Stress Questionnaire    Feeling of Stress : Not at all  Social Connections: Not on file  Intimate Partner Violence: Not At Risk (12/16/2017)   Humiliation, Afraid, Rape, and Kick questionnaire    Fear of Current or Ex-Partner: No    Emotionally Abused: No    Physically Abused: No    Sexually Abused: No     Review of Systems: General: negative for chills, fever, night sweats or weight changes.  Cardiovascular: negative for chest pain, dyspnea on exertion, edema, orthopnea, palpitations, paroxysmal nocturnal dyspnea or shortness of breath Dermatological: negative for  rash Respiratory: negative for cough or wheezing Urologic: negative for hematuria Abdominal: negative for nausea, vomiting, diarrhea, bright red blood per rectum, melena, or hematemesis Neurologic: negative for visual changes, syncope, or dizziness All other systems reviewed and are otherwise negative except as noted above.    Blood pressure 128/76, pulse 71, height 5' 9 (1.753 m), weight 147 lb 9.6 oz (67 kg), SpO2 98%.  General appearance: alert and no distress Neck: no adenopathy, no carotid bruit, no JVD, supple, symmetrical, trachea midline, and thyroid  not enlarged, symmetric, no tenderness/mass/nodules Lungs: clear to auscultation bilaterally Heart: regular rate and rhythm, S1, S2 normal, no murmur, click, rub or gallop Extremities: extremities normal, atraumatic, no cyanosis or edema Pulses: Decreased pedal  pulses Skin: Skin color, texture, turgor normal. No rashes or lesions Neurologic: Grossly normal  EKG EKG Interpretation Date/Time:  Monday September 28 2023 08:21:48 EDT Ventricular Rate:  71 PR Interval:  154 QRS Duration:  90 QT Interval:  424 QTC Calculation: 460 R Axis:   90  Text Interpretation: Normal sinus rhythm Rightward axis Confirmed by Court Carrier [phone]) on 09/28/2023 8:41:03 AM    ASSESSMENT AND PLAN:   Essential hypertension History of essential hypertension with blood pressure measured today at 128/76.  He is on amlodipine , hydralazine  and valsartan .  Hyperlipidemia History of hyperlipidemia on low-dose statin therapy with lipid profile performed 08/06/2023 revealing total cholesterol 196, LDL 107 and HDL 76.  Given his PAD I am going to get a coronary calcium  score to risk ratified.  Peripheral arterial disease (HCC) History of peripheral arterial disease with ABIs in the 0.7 range bilaterally.  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  He has 60% stenosis in his right  external iliac artery without a pullback gradient and diffuse SFA disease as well as tibial disease with two-vessel runoff on the right and 1 on the left.  He was on Pletal at that time and currently denies claudication.  Carotid artery disease (HCC) Mild to moderate bilateral ICA stenosis by duplex ultrasound performed 12/01/2022.  This will be repeated on an annual basis.  Preoperative clearance Patient referred for preoperative clearance before radioactive seed implantation for prostate cancer.  He is completely asymptomatic and has normal LV function.  This is a low risk outpatient procedure which she is cleared for     Carrier DOROTHA Court MD [facility], [facility] 09/28/2023 8:50 AM

## 2023-09-28 NOTE — Assessment & Plan Note (Signed)
 Patient referred for preoperative clearance before radioactive seed implantation for prostate cancer.  He is completely asymptomatic and has normal LV function.  This is a low risk outpatient procedure which she is cleared for

## 2023-10-02 ENCOUNTER — Other Ambulatory Visit: Payer: Self-pay | Admitting: Internal Medicine

## 2023-10-02 NOTE — Progress Notes (Signed)
 Left message for call back to review next steps with fiducial marker's and spaceOAR gel placement.

## 2023-10-05 ENCOUNTER — Encounter: Payer: Self-pay | Admitting: Hematology

## 2023-10-05 ENCOUNTER — Other Ambulatory Visit (HOSPITAL_COMMUNITY): Payer: Medicare (Managed Care)

## 2023-10-06 ENCOUNTER — Other Ambulatory Visit: Payer: Medicare (Managed Care)

## 2023-10-06 ENCOUNTER — Encounter: Payer: Self-pay | Admitting: Hematology

## 2023-10-06 ENCOUNTER — Other Ambulatory Visit: Payer: Self-pay

## 2023-10-06 DIAGNOSIS — E039 Hypothyroidism, unspecified: Secondary | ICD-10-CM

## 2023-10-07 ENCOUNTER — Encounter (HOSPITAL_COMMUNITY): Payer: Self-pay | Admitting: Urology

## 2023-10-07 LAB — TSH: TSH: 15.3 u[IU]/mL — ABNORMAL HIGH (ref 0.450–4.500)

## 2023-10-07 NOTE — Progress Notes (Signed)
 [facility] clearance dated 09/28/23 by Dr Court in Epic.  Spoke w/ via phone for pre-op interview--- Shawn Fernandez, wife Shawn Fernandez and daughter Shawn Fernandez needs dos---- BMP per anesthesia.        Fernandez results------EKG current dated 09/28/23. COVID test -----patient states asymptomatic no test needed Arrive at -------0730 NPO after MN NO Solid Food.   Pre-Surgery Ensure or G2:  Med rec completed Medications to take morning of surgery ----- Hydralizine, Levothyroxine , Protonix  and Zoloft . Diabetic medication -----  GLP1 agonist last dose: GLP1 instructions:  Patient instructed no nail polish to be worn day of surgery Patient instructed to bring photo id and insurance card day of surgery Patient aware to have Driver (ride ) / caregiver    for 24 hours after surgery - Daughter Shawn Patient Special Instructions ----- Fleet enema per surgeons instructions. Pre-Op special Instructions -----  Patient verbalized understanding of instructions that were given at this phone interview. Patient denies chest pain, sob, fever, cough at the interview.

## 2023-10-07 NOTE — Progress Notes (Signed)
 Spoke w/ via phone for pre-op interview--- Lab needs dos----         Lab results------ COVID test -----patient states asymptomatic no test needed Arrive at ------- NPO after MN NO Solid Food.  Clear liquids from MN until--- Pre-Surgery Ensure or G2:  Med rec completed Medications to take morning of surgery ----- Diabetic medication -----  GLP1 agonist last dose: GLP1 instructions:  Patient instructed no nail polish to be worn day of surgery Patient instructed to bring photo id and insurance card day of surgery Patient aware to have Driver (ride ) / caregiver    for 24 hours after surgery -  Patient Special Instructions ----- Pre-Op special Instructions -----  Patient verbalized understanding of instructions that were given at this phone interview. Patient denies chest pain, sob, fever, cough at the interview.

## 2023-10-08 ENCOUNTER — Telehealth: Payer: Self-pay | Admitting: *Deleted

## 2023-10-08 NOTE — Telephone Encounter (Signed)
 CALLED PATIENT TO REMIND OF SIM APPT. ON 10/19/23- ARRIVAL TIME- 9:45 AM , PATIENT TO REPORT WITH FULL BLADDER, ALSO PATIENT WILL HAVE AN MRI THAT DAY- ARRIVAL TIME- 11:30 AM @ [facility], LVM FOR A RETURN CALL

## 2023-10-12 ENCOUNTER — Ambulatory Visit: Payer: Self-pay | Admitting: Internal Medicine

## 2023-10-12 DIAGNOSIS — Z191 Hormone sensitive malignancy status: Secondary | ICD-10-CM | POA: Diagnosis not present

## 2023-10-12 DIAGNOSIS — E039 Hypothyroidism, unspecified: Secondary | ICD-10-CM

## 2023-10-12 NOTE — Progress Notes (Signed)
  Radiation Oncology         [phone] ________________________________  Name: Shawn Fernandez MRN: [id number]  Date: 10/19/2023  DOB: Nov 16, 1945  SIMULATION AND TREATMENT PLANNING NOTE    ICD-10-CM   1. Malignant neoplasm of prostate (HCC)  C61       DIAGNOSIS:  78 y.o. gentleman with Stage T1c adenocarcinoma of the prostate with Gleason score of 4+4, and PSA of 10.6.    NARRATIVE:  The patient was brought to the CT Simulation planning suite.  Identity was confirmed.  All relevant records and images related to the planned course of therapy were reviewed.  The patient freely provided informed written consent to proceed with treatment after reviewing the details related to the planned course of therapy. The consent form was witnessed and verified by the simulation staff.  Then, the patient was set-up in a stable reproducible supine position for radiation therapy.  A vacuum lock pillow device was custom fabricated to position his legs in a reproducible immobilized position.  Then, I performed a urethrogram under sterile conditions to identify the prostatic bed.  CT images were obtained.  MRI and or PET were fused to define the Dominant Intraprostatic Lesion (DIL).  Surface markings were placed.  The CT images were loaded into the planning software.  Then the prostate bed target, pelvic lymph node target and avoidance structures including the rectum, bladder, bowel and hips were contoured.  Treatment planning then occurred.  The radiation prescription was entered and confirmed.  A total of one complex treatment devices were fabricated. I have requested : Intensity Modulated Radiotherapy (IMRT) is medically necessary for this case for the following reason:  Rectal sparing.SABRA  PLAN:  The patient will receive 45 Gy in 25 fractions of 1.8 Gy while the DIL receives a simultaneous integrated boost to 55 Gy in 25 fractions, followed by a boost to the prostate to a total dose of 75 Gy with 15 additional  fractions of 2 Gy while the DIL receives 33 Gy in 15 fractions for a final total dose 75 Gy to the prostate and 88 Gy to the DIL.  ________________________________  Donnice LABOR. Patrcia, M.D.

## 2023-10-14 ENCOUNTER — Encounter: Payer: Self-pay | Admitting: Hematology

## 2023-10-14 ENCOUNTER — Telehealth: Payer: Self-pay | Admitting: Radiation Oncology

## 2023-10-14 NOTE — Telephone Encounter (Signed)
 PATIENT RETURNED Shawn Fernandez'S PHONE CALL CONFIRMING HIS SIM APPT. ON 10/19/23- ARRIVAL TIME- 9:45 AM , PATIENT TO REPORT WITH FULL BLADDER, AND MRI THAT DAY- ARRIVAL TIME- 11:30 AM @ [facility] - PATIENT VOICED UNDERSTANDING

## 2023-10-15 ENCOUNTER — Ambulatory Visit (HOSPITAL_COMMUNITY)
Admission: RE | Admit: 2023-10-15 | Discharge: 2023-10-15 | Disposition: A | Discharge status: Home / Self Care | Source: Ambulatory Visit | Attending: Cardiovascular Disease | Admitting: Cardiovascular Disease

## 2023-10-15 DIAGNOSIS — I739 Peripheral vascular disease, unspecified: Secondary | ICD-10-CM | POA: Insufficient documentation

## 2023-10-15 DIAGNOSIS — E782 Mixed hyperlipidemia: Secondary | ICD-10-CM | POA: Insufficient documentation

## 2023-10-15 DIAGNOSIS — I6523 Occlusion and stenosis of bilateral carotid arteries: Secondary | ICD-10-CM | POA: Insufficient documentation

## 2023-10-16 ENCOUNTER — Ambulatory Visit (HOSPITAL_BASED_OUTPATIENT_CLINIC_OR_DEPARTMENT_OTHER): Admitting: Anesthesiology

## 2023-10-16 ENCOUNTER — Encounter: Payer: Self-pay | Admitting: *Deleted

## 2023-10-16 ENCOUNTER — Encounter (HOSPITAL_COMMUNITY): Payer: Self-pay | Admitting: Urology

## 2023-10-16 ENCOUNTER — Encounter (HOSPITAL_COMMUNITY)
Admission: RE | Disposition: A | Discharge status: Home / Self Care | Payer: Self-pay | Source: Home / Self Care | Attending: Urology

## 2023-10-16 ENCOUNTER — Ambulatory Visit (HOSPITAL_COMMUNITY): Admitting: Anesthesiology

## 2023-10-16 ENCOUNTER — Other Ambulatory Visit: Payer: Self-pay

## 2023-10-16 ENCOUNTER — Ambulatory Visit: Payer: Self-pay | Admitting: Cardiovascular Disease

## 2023-10-16 ENCOUNTER — Ambulatory Visit (HOSPITAL_COMMUNITY)
Admission: RE | Admit: 2023-10-16 | Discharge: 2023-10-16 | Disposition: A | Discharge status: Home / Self Care | Payer: Medicare (Managed Care) | Attending: Urology | Admitting: Urology

## 2023-10-16 DIAGNOSIS — Z8249 Family history of ischemic heart disease and other diseases of the circulatory system: Secondary | ICD-10-CM | POA: Insufficient documentation

## 2023-10-16 DIAGNOSIS — E039 Hypothyroidism, unspecified: Secondary | ICD-10-CM | POA: Diagnosis not present

## 2023-10-16 DIAGNOSIS — I10 Essential (primary) hypertension: Secondary | ICD-10-CM | POA: Diagnosis not present

## 2023-10-16 DIAGNOSIS — C61 Malignant neoplasm of prostate: Secondary | ICD-10-CM | POA: Insufficient documentation

## 2023-10-16 DIAGNOSIS — Z87891 Personal history of nicotine dependence: Secondary | ICD-10-CM

## 2023-10-16 DIAGNOSIS — I119 Hypertensive heart disease without heart failure: Secondary | ICD-10-CM

## 2023-10-16 DIAGNOSIS — R931 Abnormal findings on diagnostic imaging of heart and coronary circulation: Secondary | ICD-10-CM

## 2023-10-16 DIAGNOSIS — F17200 Nicotine dependence, unspecified, uncomplicated: Secondary | ICD-10-CM | POA: Insufficient documentation

## 2023-10-16 HISTORY — DX: Gastro-esophageal reflux disease without esophagitis: K21.9

## 2023-10-16 HISTORY — DX: Malignant (primary) neoplasm, unspecified: C80.1

## 2023-10-16 LAB — BASIC METABOLIC PANEL WITH GFR
Anion gap: 9 (ref 5–15)
BUN: 15 mg/dL (ref 8–23)
CO2: 24 mmol/L (ref 22–32)
Calcium: 9.2 mg/dL (ref 8.9–10.3)
Chloride: 109 mmol/L (ref 98–111)
Creatinine, Ser: 0.97 mg/dL (ref 0.61–1.24)
GFR, Estimated: >60 mL/min (ref >60)
Glucose, Bld: 102 mg/dL — ABNORMAL HIGH (ref 70–99)
Potassium: 3.7 mmol/L (ref 3.5–5.1)
Sodium: 142 mmol/L (ref 135–145)

## 2023-10-16 SURGERY — INSERTION, GOLD SEEDS
Anesthesia: Monitor Anesthesia Care

## 2023-10-16 MED ORDER — ONDANSETRON HCL 4 MG/2ML IJ SOLN
INTRAMUSCULAR | Status: DC | PRN
Start: 2023-10-16 — End: 2023-10-16
  Administered 2023-10-16: 4 mg via INTRAVENOUS

## 2023-10-16 MED ORDER — AMISULPRIDE (ANTIEMETIC) 5 MG/2ML IV SOLN: 10.0000 mg | Freq: Once | INTRAVENOUS | Status: DC | PRN

## 2023-10-16 MED ORDER — BUPIVACAINE HCL (PF) 0.25 % IJ SOLN
INTRAMUSCULAR | Status: DC | PRN
  Administered 2023-10-16: 10 mL

## 2023-10-16 MED ORDER — LIDOCAINE 2% (20 MG/ML) 5 ML SYRINGE
INTRAMUSCULAR | Status: DC | PRN
  Administered 2023-10-16: 60 mg via INTRAVENOUS

## 2023-10-16 MED ORDER — ORAL CARE MOUTH RINSE: 15.0000 mL | Freq: Once | OROMUCOSAL | Status: AC

## 2023-10-16 MED ORDER — SODIUM CHLORIDE (PF) 0.9 % IJ SOLN
INTRAMUSCULAR | Status: DC | PRN
  Administered 2023-10-16: 10 mL

## 2023-10-16 MED ORDER — ONDANSETRON HCL 4 MG/2ML IJ SOLN
INTRAMUSCULAR | Status: AC
  Filled 2023-10-16: qty 2

## 2023-10-16 MED ORDER — CHLORHEXIDINE GLUCONATE 0.12 % MT SOLN
15.0000 mL | Freq: Once | OROMUCOSAL | Status: AC
  Administered 2023-10-16: 15 mL via OROMUCOSAL

## 2023-10-16 MED ORDER — LIDOCAINE 2% (20 MG/ML) 5 ML SYRINGE
INTRAMUSCULAR | Status: AC
  Filled 2023-10-16: qty 5

## 2023-10-16 MED ORDER — BUPIVACAINE HCL (PF) 0.25 % IJ SOLN
INTRAMUSCULAR | Status: AC
  Filled 2023-10-16: qty 30

## 2023-10-16 MED ORDER — ACETAMINOPHEN 10 MG/ML IV SOLN: 1000.0000 mg | Freq: Once | INTRAVENOUS | Status: DC | PRN

## 2023-10-16 MED ORDER — CEFAZOLIN SODIUM-DEXTROSE 2-4 GM/100ML-% IV SOLN
INTRAVENOUS | Status: AC
  Filled 2023-10-16: qty 100

## 2023-10-16 MED ORDER — CHLORHEXIDINE GLUCONATE 0.12 % MT SOLN
OROMUCOSAL | Status: AC
  Filled 2023-10-16: qty 15

## 2023-10-16 MED ORDER — PROPOFOL 10 MG/ML IV BOLUS
INTRAVENOUS | Status: DC | PRN
  Administered 2023-10-16: 100 mg via INTRAVENOUS
  Administered 2023-10-16: 50 mg via INTRAVENOUS

## 2023-10-16 MED ORDER — ONDANSETRON HCL 4 MG/2ML IJ SOLN: 4.0000 mg | Freq: Once | INTRAMUSCULAR | Status: DC | PRN

## 2023-10-16 MED ORDER — CEFAZOLIN SODIUM-DEXTROSE 2-4 GM/100ML-% IV SOLN
2.0000 g | INTRAVENOUS | Status: AC
  Administered 2023-10-16: 2 g via INTRAVENOUS

## 2023-10-16 MED ORDER — SODIUM CHLORIDE (PF) 0.9 % IJ SOLN
INTRAMUSCULAR | Status: AC
  Filled 2023-10-16: qty 10

## 2023-10-16 MED ORDER — DEXAMETHASONE SODIUM PHOSPHATE 10 MG/ML IJ SOLN
INTRAMUSCULAR | Status: DC | PRN
  Administered 2023-10-16: 4 mg via INTRAVENOUS

## 2023-10-16 MED ORDER — LACTATED RINGERS IV SOLN: INTRAVENOUS | Status: DC

## 2023-10-16 MED ORDER — FENTANYL CITRATE (PF) 100 MCG/2ML IJ SOLN: 25.0000 ug | INTRAMUSCULAR | Status: DC | PRN

## 2023-10-16 SURGICAL SUPPLY — 22 items
BLADE CLIPPER SENSICLIP SURGIC (BLADE) ×1 IMPLANT
CNTNR URN SCR LID CUP LEK RST (MISCELLANEOUS) ×1 IMPLANT
COVER BACK TABLE 60X90IN (DRAPES) ×1 IMPLANT
DRSG TEGADERM 4X4.75 (GAUZE/BANDAGES/DRESSINGS) ×1 IMPLANT
DRSG TEGADERM 8X12 (GAUZE/BANDAGES/DRESSINGS) ×1 IMPLANT
GAUZE SPONGE 4X4 12PLY STRL (GAUZE/BANDAGES/DRESSINGS) ×1 IMPLANT
GLOVE BIO SURGEON STRL SZ7.5 (GLOVE) ×1 IMPLANT
GLOVE SURG ORTHO 8.5 STRL (GLOVE) ×1 IMPLANT
IMPL SPACEOAR SYSTEM 10ML (Spacer) ×1 IMPLANT
KIT TURNOVER KIT B (KITS) ×1 IMPLANT
MARKER GOLD PRELOAD 1.2X3 (Urological Implant) ×1 IMPLANT
MARKER SKIN DUAL TIP RULER LAB (MISCELLANEOUS) ×1 IMPLANT
NDL SPNL 22GX3.5 QUINCKE BK (NEEDLE) ×1 IMPLANT
NEEDLE SPNL 22GX3.5 QUINCKE BK (NEEDLE) ×1 IMPLANT
SHEATH ULTRASOUND LF (SHEATH) IMPLANT
SHEATH ULTRASOUND LTX NONSTRL (SHEATH) IMPLANT
SLEEVE SCD COMPRESS KNEE MED (STOCKING) ×1 IMPLANT
SURGILUBE 2OZ TUBE FLIPTOP (MISCELLANEOUS) ×1 IMPLANT
SYR 10ML LL (SYRINGE) ×1 IMPLANT
SYR CONTROL 10ML LL (SYRINGE) ×1 IMPLANT
TOWEL GREEN STERILE (TOWEL DISPOSABLE) ×1 IMPLANT
UNDERPAD 30X36 HEAVY ABSORB (UNDERPADS AND DIAPERS) ×1 IMPLANT

## 2023-10-16 NOTE — Transfer of Care (Signed)
 Immediate Anesthesia Transfer of Care Note  Patient: Shawn Fernandez  Procedure(s) Performed: INSERTION, GOLD SEEDS INJECTION, HYDROGEL SPACER  Patient Location: PACU  Anesthesia Type:MAC  Level of Consciousness: awake, alert , oriented, and patient cooperative  Airway & Oxygen Therapy: Patient Spontanous Breathing  Post-op Assessment: Report given to RN, Post -op Vital signs reviewed and stable, Patient moving all extremities X 4, and Patient able to stick tongue midline  Post vital signs: Reviewed and stable  Last Vitals:  Vitals Value Taken Time  BP 138/66   Temp 97.6   Pulse 55 10/16/23 10:24  Resp 19   SpO2 98 % 10/16/23 10:24  Vitals shown include unfiled device data.  Last Pain:  Vitals:   10/16/23 0812  TempSrc: Oral  PainSc: 0-No pain      Patients Stated Pain Goal: 2 (10/16/23 9187)  Complications: No notable events documented.

## 2023-10-16 NOTE — Anesthesia Procedure Notes (Signed)
 Procedure Name: MAC Date/Time: 10/16/2023 10:01 AM  Performed by: Viviana Almarie DASEN, CRNAPre-anesthesia Checklist: Patient identified, Suction available, Patient being monitored, Emergency Drugs available and Timeout performed Patient Re-evaluated:Patient Re-evaluated prior to induction Oxygen Delivery Method: Nasal cannula Induction Type: IV induction

## 2023-10-16 NOTE — Op Note (Signed)
 Operative Note  Preoperative diagnosis:  1.  Clinically localized adenocarcinoma of the prostate   Postoperative diagnosis: 1.  Clinically localized adenocarcinoma of the prostate  Procedure(s): 1. Placement of fiducial markers into prostate 2. Insertion of SpaceOAR hydrogel   Surgeon: Donnice Siad, MD  Assistants:  None  Anesthesia:  General  Complications:  None  EBL:  Minimal  Specimens: 1. None  Drains/Catheters: 1.  None  Indication:  Shawn Fernandez is a 78 y.o. male with clinically localized high risk prostate cancer. After discussing management options for treatment, he elected to proceed with radiotherapy. He presents today for the above procedures. The potential risks, complications, alternative options, and expected recovery course have been discussed in detail with the patient and he has provided informed consent to proceed.  Description of procedure: The patient was administered preoperative antibiotics, placed in the dorsal lithotomy position, and prepped and draped in the usual sterile fashion. Next, transrectal ultrasonography was utilized to visualize the prostate. Three gold fiducial markers were then placed into the prostate via transperineal needles under ultrasound guidance at the right apex, right base, and left mid gland under direct ultrasound guidance. A site in the midline was then selected on the perineum for placement of an 18 g needle with saline. The needle was advanced above the rectum and below Denonvillier's fascia to the mid gland and confirmed to be in the midline on transverse imaging. One cc of saline was injected confirming appropriate expansion of this space. A total of 5 cc of saline was then injected to open the space further bilaterally. The saline syringe was then removed and the SpaceOAR hydrogel was injected with good distribution bilaterally. He tolerated the procedure well and without complications. He was given a voiding trial prior to  discharge from the PACU.  Matt R. Quinteria Chisum MD [facility]  Pager: [phone]

## 2023-10-16 NOTE — Anesthesia Preprocedure Evaluation (Addendum)
 Anesthesia Evaluation  Patient identified by MRN, date of birth, ID band Patient awake    Reviewed: Allergy & Precautions, NPO status , Patient's Chart, lab work & pertinent test results  Airway Mallampati: II  TM Distance: >3 FB Neck ROM: Full    Dental  (+) Missing   Pulmonary former smoker   Pulmonary exam normal        Cardiovascular hypertension, Pt. on medications + Peripheral Vascular Disease  Normal cardiovascular exam     Neuro/Psych   Anxiety     negative neurological ROS     GI/Hepatic Neg liver ROS,GERD  Medicated and Controlled,,  Endo/Other  Hypothyroidism    Renal/GU negative Renal ROS     Musculoskeletal  (+) Arthritis ,    Abdominal   Peds  Hematology negative hematology ROS (+)   Anesthesia Other Findings PROSTATE CANCER  Reproductive/Obstetrics                              Anesthesia Physical Anesthesia Plan  ASA: 3  Anesthesia Plan: MAC   Post-op Pain Management:    Induction:   PONV Risk Score and Plan: 2 and Ondansetron, Dexamethasone, Propofol infusion and Treatment may vary due to age or medical condition  Airway Management Planned: Simple Face Mask  Additional Equipment:   Intra-op Plan:   Post-operative Plan:   Informed Consent: I have reviewed the patients History and Physical, chart, labs and discussed the procedure including the risks, benefits and alternatives for the proposed anesthesia with the patient or authorized representative who has indicated his/her understanding and acceptance.     Dental advisory given  Plan Discussed with: CRNA  Anesthesia Plan Comments:          Anesthesia Quick Evaluation

## 2023-10-16 NOTE — Discharge Instructions (Addendum)
 Activity:  You are encouraged to ambulate frequently (about every hour during waking hours) to help prevent blood clots from forming in your legs or lungs.  However, you should not engage in any heavy lifting (> 10-15 lbs), strenuous activity, or straining.  Diet: You should advance your diet as instructed by your physician.  It will be normal to have some bloating, nausea, and abdominal discomfort intermittently.  What to call us  about: You should call the office [phone]) if you develop fever > 101 or develop persistent vomiting. Activity:  You are encouraged to ambulate frequently (about every hour during waking hours) to help prevent blood clots from forming in your legs or lungs.  However, you should not engage in any heavy lifting (> 10-15 lbs), strenuous activity, or straining.  Post Anesthesia Home Care Instructions  Activity: Get plenty of rest for the remainder of the day. A responsible adult should stay with you for 24 hours following the procedure.  For the next 24 hours, DO NOT: -Drive a car -Advertising copywriter -Drink alcoholic beverages -Take any medication unless instructed by your physician -Make any legal decisions or sign important papers.  Meals: Start with liquid foods such as gelatin or soup. Progress to regular foods as tolerated. Avoid greasy, spicy, heavy foods. If nausea and/or vomiting occur, drink only clear liquids until the nausea and/or vomiting subsides. Call your physician if vomiting continues.  Special Instructions/Symptoms: Your throat may feel dry or sore from the anesthesia or the breathing tube placed in your throat during surgery. If this causes discomfort, gargle with warm salt water. The discomfort should disappear within 24 hours.

## 2023-10-16 NOTE — H&P (Signed)
 Office Visit Report     06/15/2023   --------------------------------------------------------------------------------   Shawn Fernandez  MRN: [id number]  DOB: [birth year], 78 year old Male  SSN:    PRIMARY CARE:  Catheryn Slocumb, MD  PRIMARY CARE FAX:  [phone]  REFERRING:  Morene MICAEL Salines, MD  PROVIDER:  Morene Salines, M.D.  TREATING:  Ubaldo Eagles, NP  LOCATION:  [facility] [phone]     --------------------------------------------------------------------------------   CC/HPI: 78 year old African-American male presents today for follow-up. I saw him over 2 years ago, at which point he had an elevated PSA. He was scheduled for follow-up in 3 months and no showed. He has not had any PSAs checked since he was last seen. He denies any progression of his voiding symptoms. Specifically denies any urgency, incontinence, hematuria or dysuria. He is otherwise been doing relatively well and has been healthy. He does have a history of erectile dysfunction, has been using sildenafil 100 mg tablets. This works to get him an erection, but oftentimes does not sustained. He has been purchasing sildenafil here quite regularly.   The patient's father was diagnosed with prostate cancer but died from a heart attack.   Fusion guided prostate biopsy, 04/30/2023:  PI-RADS 4 lesion right posterior lateral: 1 core Gleason 4+4 = 8 (20%), 1 core Gleason 3+4 = 7  Standard biopsy template: 3/12 cores positive, Gleason 4+3 = 7 in both right mid medial and mid lateral specimens, 3+4 = 7 in right mid apical biopsy  Prostate volume: 44.4 g    Interval: Hip patient is here for further discussion of his prostate cancer. He was diagnosed with Gleason grade group 4 prostate cancer (albeit fairly low-volume). He tolerated the biopsy well. He has no questions or complaints today.   Patient has a history of emphysema, is a current smoker, but has excellent exercise tolerance. He is otherwise  fairly healthy.   06/15/2023: Discussion about referral to Dr. Patrcia at last exam for radiation treatment was held. That appointment/consult has yet to occur. He did undergo a PSMA PET scan which showed no evidence of metastatic disease but there was a large region of intense radiotracer activity in the right lobe of the prostate gland consistent with his underlying prostate cancer diagnosis. Here today for additional evaluation and to initiate ADT at the recommendation of Dr. Salines. Overall doing well. Denies any new or worsening LUTS including changes in force of stream, increased frequency/urgency. No interval dysuria or gross hematuria. He denies any unexplained weight loss, new unexplained fatigue or new unexplained bone pain. UA today within normal limits.     ALLERGIES: lisinopril    MEDICATIONS: Levothyroxine  Sodium 100 MCG Tablet  Metoprolol Succinate  Sildenafil Citrate 100 MG Tablet 1 tablet PO Daily PRN  Simvastatin  20 MG Tablet  amLODIPine  Bes+SyrSpend SF  Aspirin  81 MG Tablet Delayed Release  Cholecalciferol  Cilostazol 50 MG Tablet  Multivitamin  Pantoprazole  Sodium 40 MG Tablet Delayed Release  Valsartan  160 MG Tablet  Vitamin D3  Zoloft      GU PSH: Prostate Needle Biopsy - 04/30/2023       PSH Notes: Intestinal surgery- blockage (2013), left forearm (1976), right ankle (1973)   NON-GU PSH: Surgical Pathology, Gross And Microscopic Examination For Prostate Needle - 04/30/2023 Visit Complexity (formerly [facility]1X) - 05/11/2023, 03/09/2023, 12/15/2022     GU PMH: Prostate Cancer - 05/11/2023 Elevated PSA - 04/30/2023, - 03/09/2023, - 12/15/2022, - 2022, - 2020, - 2019, - 2019 ED due to arterial insufficiency -  12/15/2022, - 06/10/2022, - 2022, - 2019 Encounter for Prostate Cancer screening - 2021    NON-GU PMH: DVT, History GERD Hypercholesterolemia Hypertension    FAMILY HISTORY: 1 Daughter - Daughter Diabetes - Mother, Brother Heart Attack - Mother Heart Disease  - Brother Lung Cancer - Father Prostate Cancer - Father   SOCIAL HISTORY: Marital Status: Married Preferred Language: English; Ethnicity: Not Hispanic Or Latino; Race: Black or African American Current Smoking Status: Patient smokes. Has smoked since 09/01/1987.   Tobacco Use Assessment Completed: Used Tobacco in last 30 days? Has never drank.  Drinks 2 caffeinated drinks per day. Patient's occupation is/was Retired.    REVIEW OF SYSTEMS:    GU Review Male:   Patient denies frequent urination, hard to postpone urination, burning/ pain with urination, get up at night to urinate, leakage of urine, stream starts and stops, trouble starting your stream, have to strain to urinate , erection problems, and penile pain.  Gastrointestinal (Upper):   Patient denies nausea, vomiting, and indigestion/ heartburn.  Gastrointestinal (Lower):   Patient denies diarrhea and constipation.  Constitutional:   Patient denies fever, night sweats, weight loss, and fatigue.  Skin:   Patient denies skin rash/ lesion and itching.  Eyes:   Patient denies blurred vision and double vision.  Ears/ Nose/ Throat:   Patient denies sore throat and sinus problems.  Hematologic/Lymphatic:   Patient denies swollen glands and easy bruising.  Cardiovascular:   Patient denies leg swelling and chest pains.  Respiratory:   Patient denies cough and shortness of breath.  Endocrine:   Patient denies excessive thirst.  Musculoskeletal:   Patient denies back pain and joint pain.  Neurological:   Patient denies headaches and dizziness.  Psychologic:   Patient denies depression and anxiety.   Notes: Updated from previous visit 06/10/2022 with review from patient as noted above.   VITAL SIGNS:      06/15/2023 10:56 AM  BP 169/73 mmHg  Pulse 56 /min  Temperature 98.3 F / 36.8 C   MULTI-SYSTEM PHYSICAL EXAMINATION:    Constitutional: Well-nourished. No physical deformities. Normally developed. Good grooming.  Neck: Neck  symmetrical, not swollen. Normal tracheal position.  Respiratory: No labored breathing, no use of accessory muscles.   Cardiovascular: Normal temperature, normal extremity pulses, no swelling, no varicosities.  Skin: No paleness, no jaundice, no cyanosis. No lesion, no ulcer, no rash.  Neurologic / Psychiatric: Oriented to time, oriented to place, oriented to person. No depression, no anxiety, no agitation.  Gastrointestinal: No mass, no tenderness, no rigidity, non obese abdomen.  Musculoskeletal: Normal gait and station of head and neck.     Complexity of Data:  Source Of History:  Patient, Medical Record Summary  Lab Test Review:   PSA  Records Review:   Pathology Reports, Previous Doctor Records, Previous Hospital Records, Previous Patient Records  Urine Test Review:   Urinalysis  X-Ray Review: PET- PSMA Scan: Reviewed Report.     02/03/23 12/11/22 06/10/22 04/16/20 01/15/20 05/23/19 05/17/18 09/29/17  PSA  Total PSA 10.60 ng/mL 9.55 ng/mL 11.60 ng/mL 5.82 ng/mL 6.7 ng/ml 3.97 ng/mL 3.63 ng/mL 3.16 ng/mL    06/15/23  Urinalysis  Urine Appearance Clear   Urine Color Yellow   Urine Glucose Neg mg/dL  Urine Bilirubin Neg mg/dL  Urine Ketones Neg mg/dL  Urine Specific Gravity 1.020   Urine Blood Neg ery/uL  Urine pH <=5.0   Urine Protein Neg mg/dL  Urine Urobilinogen 0.2 mg/dL  Urine Nitrites Neg   Urine Leukocyte  Esterase Neg leu/uL   PROCEDURES:          Visit Complexity - G2211          Urinalysis Dipstick Dipstick Cont'd  Color: Yellow Bilirubin: Neg mg/dL  Appearance: Clear Ketones: Neg mg/dL  Specific Gravity: 8.979 Blood: Neg ery/uL  pH: <=5.0 Protein: Neg mg/dL  Glucose: Neg mg/dL Urobilinogen: 0.2 mg/dL    Nitrites: Neg    Leukocyte Esterase: Neg leu/uL         Eligard 45mg / 6 Month - 03597, K0782 The injection site was sterilely prepped with alcohol. Eligard was injected subcutaneously () using standard technique. The patient tolerated the procedure  well. A band aid was applied. The site was dry when the patient left the exam room. The patient will return as scheduled.  After treatment was administered, patient was observed without any adverse reactions noted.    Qty: 45 Adm. By: Narda Kitty  Unit: mg Adm. On: 06/15/2023 11:31 AM  Route: SQ Lot No: [id number]  Freq: None Exp. Date: 10/01/2024    Mfgr.:   Site: Left Buttock   ASSESSMENT:      ICD-10 Details  1 GU:   Prostate Cancer - C61 Chronic, Threat to Bodily Function   PLAN:           Schedule Return Visit/Planned Activity: ASAP - Consult Refer to physician - Donnice LABOR. Patrcia, MD  Procedure: 06/15/2023 at [facility] - [phone] - Eligard 45mg / 6 Month (Eligard) [phone], [phone]          Document Letter(s):  Created for Patient: Clinical Summary         Notes:   ASAP referral to Dr Patrcia placed today.   He will be administered Eligard 45 mg to initiate ADT. Reviewed potential for side effects and adverse reactions. I gave an educational book regarding treatment as well. I encouraged continued efforts at staying well-hydrated and nourished. Recommended continued activity including aerobic and weightbearing exercise. Also recommended taking an over-the-counter vitamin D and calcium  and supplement. All questions answered to the best my ability with understanding expressed by the patient and family members present today. He elects to proceed.   Once consultation with radiation oncology occurs, the patient will be scheduled either for fiducial marker placement or brachytherapy, both with SpaceOAR. This was discussed today as well.   CC Dr Cam        Next Appointment:      Next Appointment: 10/09/2023 10:00 AM    Appointment Type: Laboratory Appointment    Location: [facility] [phone]    Provider: Lab LAB    Reason for Visit: 4 MNTHS PSA      Urology Preoperative H&P   Chief Complaint: Prostate cancer  History of Present  Illness: Shawn Fernandez is a 78 y.o. male with high risk prostate cancer here for fiducial marker and SpaceOAR. Denies fevers, chills, dysuria.    Past Medical History:  Diagnosis Date   Blood clot in abdominal vein 1999   [facility] at [facility])    Carotid artery disease (HCC)    Dyslipidemia    FHx: heart disease    GERD (gastroesophageal reflux disease)    History of blood transfusion 8-9 yrs ago   HTN (hypertension)    Peripheral vascular disease (HCC)     Past Surgical History:  Procedure Laterality Date   ANKLE SURGERY Right yrs ago   arm surgery Left yrs ago   2  rods inserted, 1 rod later removed   COLONOSCOPY WITH PROPOFOL  N/A 09/16/2013   Procedure: COLONOSCOPY WITH PROPOFOL ;  Surgeon: Belvie JONETTA Just, MD;  Location: [facility];  Service: Endoscopy;  Laterality: N/A;   LOWER EXTREMITY ANGIOGRAM N/A 07/24/2011   Procedure: LOWER EXTREMITY ANGIOGRAM;  Surgeon: Dorn JINNY Lesches, MD;  Location: [facility] CATH LAB;  Service: Cardiovascular;  Laterality: N/A;   stent in abdominal vein  4-5 yrs ago   US  ECHOCARDIOGRAPHY  07-20-2007   EF 55-60%    Allergies:  Allergies  Allergen Reactions   Lisinopril Cough    Family History  Problem Relation Age of Onset   Heart disease Brother    Heart disease Mother    Cancer Father        ? type    Social History:  reports that he quit smoking about 14 years ago. His smoking use included cigarettes. He started smoking about 61 years ago. He has a 35.2 pack-year smoking history. He has never used smokeless tobacco. He reports current alcohol use. He reports that he does not use drugs.  ROS: A complete review of systems was performed.  All systems are negative except for pertinent findings as noted.  Physical Exam:  Vital signs in last 24 hours: Temp:  [97.7 F (36.5 C)] 97.7 F (36.5 C) (08/15 0812) Pulse Rate:  [67] 67 (08/15 0812) Resp:  [17] 17 (08/15 0812) BP: (152)/(68) 152/68 (08/15 0812) SpO2:  [97 %] 97 %  (08/15 0812) Weight:  [68 kg] 68 kg (08/15 0812) Constitutional:  Alert and oriented, No acute distress Cardiovascular: Regular rate and rhythm Respiratory: Normal respiratory effort, Lungs clear bilaterally GI: Abdomen is soft, nontender, nondistended, no abdominal masses GU: No CVA tenderness Lymphatic: No lymphadenopathy Neurologic: Grossly intact, no focal deficits Psychiatric: Normal mood and affect  Laboratory Data:  No results for input(s): WBC, HGB, HCT, PLT in the last 72 hours.  Recent Labs    10/16/23 0830  NA 142  K 3.7  CL 109  GLUCOSE 102*  BUN 15  CALCIUM  9.2  CREATININE 0.97     Results for orders placed or performed during the hospital encounter of 10/16/23 (from the past 24 hours)  Basic metabolic panel     Status: Abnormal   Collection Time: 10/16/23  8:30 AM  Result Value Ref Range   Sodium 142 135 - 145 mmol/L   Potassium 3.7 3.5 - 5.1 mmol/L   Chloride 109 98 - 111 mmol/L   CO2 24 22 - 32 mmol/L   Glucose, Bld 102 (H) 70 - 99 mg/dL   BUN 15 8 - 23 mg/dL   Creatinine, Ser 9.02 0.61 - 1.24 mg/dL   Calcium  9.2 8.9 - 10.3 mg/dL   GFR, Estimated >39 >39 mL/min   Anion gap 9 5 - 15   No results found for this or any previous visit (from the past 240 hours).  Renal Function: Recent Labs    10/16/23 0830  CREATININE 0.97   Estimated Creatinine Clearance: 60.4 mL/min (by C-G formula based on SCr of 0.97 mg/dL).  Radiologic Imaging: CT CARDIAC SCORING (SELF PAY ONLY) Result Date: 10/15/2023 CLINICAL DATA:  Cardiovascular Disease Risk stratification EXAM: Coronary Calcium  Score TECHNIQUE: A gated, non-contrast computed tomography scan of the heart was performed using 3mm slice thickness. Axial images were analyzed on a dedicated workstation. Calcium  scoring of the coronary arteries was performed using the Agatston method. FINDINGS: Coronary arteries: Normal origins. Coronary Calcium  Score: Left main: 210 Left anterior  descending artery: 968  Left circumflex artery: 317 Right coronary artery: 817 Total: 2312 Percentile: 89 Pericardium: Normal. Aorta: Normal caliber of ascending aorta. Aortic atherosclerosis noted. Non-cardiac: See separate report from [facility]. IMPRESSION: Coronary calcium score of 2312. This was 89th percentile for age-, race-, and sex-matched controls. Aortic atherosclerosis noted. RECOMMENDATIONS: Coronary artery calcium (CAC) score is a strong predictor of incident coronary heart disease (CHD) and provides predictive information beyond traditional risk factors. CAC scoring is reasonable to use in the decision to withhold, postpone, or initiate statin therapy in intermediate-risk or selected borderline-risk asymptomatic adults (age 73-75 years and LDL-C >=70 to <190 mg/dL) who do not have diabetes or established atherosclerotic cardiovascular disease (ASCVD).* In intermediate-risk (10-year ASCVD risk >=7.5% to <20%) adults or selected borderline-risk (10-year ASCVD risk >=5% to <7.5%) adults in whom a CAC score is measured for the purpose of making a treatment decision the following recommendations have been made: If CAC=0, it is reasonable to withhold statin therapy and reassess in 5 to 10 years, as long as higher risk conditions are absent (diabetes mellitus, family history of premature CHD in first degree relatives (males <55 years; females <65 years), cigarette smoking, or LDL >=190 mg/dL). If CAC is 1 to 99, it is reasonable to initiate statin therapy for patients >=4 years of age. If CAC is >=100 or >=75th percentile, it is reasonable to initiate statin therapy at any age. Cardiology referral should be considered for patients with CAC scores >=400 or >=75th percentile. *2018 AHA/ACC/AACVPR/AAPA/ABC/ACPM/ADA/AGS/APhA/ASPC/NLA/PCNA Guideline on the Management of Blood Cholesterol: A Report of the American College of Cardiology/American Heart Association Task Force on Clinical Practice Guidelines. J Am Coll Cardiol.  2019;73(24):3168-3209. Shelda Bruckner, MD Electronically Signed   By: Shelda Bruckner M.D.   On: 10/15/2023 18:02    I independently reviewed the above imaging studies.  Assessment and Plan DEARIS DANIS is a 78 y.o. male with high risk prostate cancer here for fiducial marker and SpaceOAR.    Matt R. Eliza Green MD 10/16/2023, 9:49 AM  [facility] Pager: [phone]): [phone]

## 2023-10-19 ENCOUNTER — Ambulatory Visit
Admission: RE | Admit: 2023-10-19 | Discharge: 2023-10-19 | Disposition: A | Discharge status: Home / Self Care | Payer: Medicare (Managed Care) | Source: Ambulatory Visit | Attending: Urology | Admitting: Urology

## 2023-10-19 ENCOUNTER — Encounter (HOSPITAL_COMMUNITY): Payer: Self-pay | Admitting: Urology

## 2023-10-19 ENCOUNTER — Ambulatory Visit (HOSPITAL_COMMUNITY)
Admission: RE | Admit: 2023-10-19 | Discharge: 2023-10-19 | Disposition: A | Discharge status: Home / Self Care | Source: Ambulatory Visit | Attending: Urology | Admitting: Urology

## 2023-10-19 DIAGNOSIS — Z510 Encounter for antineoplastic radiation therapy: Secondary | ICD-10-CM | POA: Diagnosis not present

## 2023-10-19 DIAGNOSIS — C61 Malignant neoplasm of prostate: Secondary | ICD-10-CM | POA: Insufficient documentation

## 2023-10-19 DIAGNOSIS — Z191 Hormone sensitive malignancy status: Secondary | ICD-10-CM | POA: Diagnosis not present

## 2023-10-19 NOTE — Anesthesia Postprocedure Evaluation (Signed)
 Anesthesia Post Note  Patient: Shawn Fernandez  Procedure(s) Performed: INSERTION, GOLD SEEDS INJECTION, HYDROGEL SPACER     Patient location during evaluation: PACU Anesthesia Type: MAC Level of consciousness: awake Pain management: pain level controlled Vital Signs Assessment: post-procedure vital signs reviewed and stable Respiratory status: spontaneous breathing, nonlabored ventilation and respiratory function stable Cardiovascular status: blood pressure returned to baseline and stable Postop Assessment: no apparent nausea or vomiting Anesthetic complications: no   No notable events documented.  Last Vitals:  Vitals:   10/16/23 1051 10/16/23 1057  BP:    Pulse: (!) 50   Resp: 13   Temp:  36.6 C  SpO2: 98%     Last Pain:  Vitals:   10/16/23 1051  TempSrc:   PainSc: 0-No pain                 Shlonda Dolloff P Darnice Comrie

## 2023-10-20 MED ORDER — LEVOTHYROXINE SODIUM 112 MCG PO TABS: 112.0000 ug | ORAL_TABLET | Freq: Every day | ORAL | 1 refills | Status: DC

## 2023-10-20 NOTE — Addendum Note (Signed)
 Addended by: GLADIS KRISTEEN PARAS on: 10/20/2023 03:49 PM   Modules accepted: Orders

## 2023-10-28 ENCOUNTER — Ambulatory Visit

## 2023-10-29 ENCOUNTER — Ambulatory Visit

## 2023-10-29 DIAGNOSIS — Z191 Hormone sensitive malignancy status: Secondary | ICD-10-CM | POA: Diagnosis not present

## 2023-10-29 DIAGNOSIS — C61 Malignant neoplasm of prostate: Secondary | ICD-10-CM | POA: Diagnosis not present

## 2023-10-29 DIAGNOSIS — Z510 Encounter for antineoplastic radiation therapy: Secondary | ICD-10-CM | POA: Diagnosis not present

## 2023-10-30 ENCOUNTER — Ambulatory Visit

## 2023-11-03 ENCOUNTER — Other Ambulatory Visit: Payer: Self-pay

## 2023-11-03 ENCOUNTER — Ambulatory Visit
Admission: RE | Admit: 2023-11-03 | Discharge: 2023-11-03 | Disposition: A | Discharge status: Home / Self Care | Payer: Self-pay | Source: Ambulatory Visit | Attending: Radiation Oncology | Admitting: Radiation Oncology

## 2023-11-03 DIAGNOSIS — Z191 Hormone sensitive malignancy status: Secondary | ICD-10-CM | POA: Diagnosis not present

## 2023-11-03 DIAGNOSIS — Z510 Encounter for antineoplastic radiation therapy: Secondary | ICD-10-CM | POA: Insufficient documentation

## 2023-11-03 DIAGNOSIS — C61 Malignant neoplasm of prostate: Secondary | ICD-10-CM | POA: Insufficient documentation

## 2023-11-03 LAB — RAD ONC ARIA SESSION SUMMARY
Course Elapsed Days: 0
Plan Fractions Treated to Date: 1
Plan Prescribed Dose Per Fraction: 1.8 Gy
Plan Total Fractions Prescribed: 25
Plan Total Prescribed Dose: 45 Gy
Reference Point Dosage Given to Date: 1.8 Gy
Reference Point Session Dosage Given: 1.8 Gy
Session Number: 1

## 2023-11-04 ENCOUNTER — Ambulatory Visit
Admission: RE | Admit: 2023-11-04 | Discharge: 2023-11-04 | Disposition: A | Discharge status: Home / Self Care | Payer: Self-pay | Source: Ambulatory Visit | Attending: Radiation Oncology | Admitting: Radiation Oncology

## 2023-11-04 ENCOUNTER — Other Ambulatory Visit: Payer: Self-pay

## 2023-11-04 DIAGNOSIS — Z191 Hormone sensitive malignancy status: Secondary | ICD-10-CM | POA: Diagnosis not present

## 2023-11-04 DIAGNOSIS — Z510 Encounter for antineoplastic radiation therapy: Secondary | ICD-10-CM | POA: Diagnosis not present

## 2023-11-04 LAB — RAD ONC ARIA SESSION SUMMARY
Course Elapsed Days: 1
Plan Fractions Treated to Date: 2
Plan Prescribed Dose Per Fraction: 1.8 Gy
Plan Total Fractions Prescribed: 25
Plan Total Prescribed Dose: 45 Gy
Reference Point Dosage Given to Date: 3.6 Gy
Reference Point Session Dosage Given: 1.8 Gy
Session Number: 2

## 2023-11-05 ENCOUNTER — Other Ambulatory Visit: Payer: Self-pay

## 2023-11-05 ENCOUNTER — Ambulatory Visit
Admission: RE | Admit: 2023-11-05 | Discharge: 2023-11-05 | Disposition: A | Discharge status: Home / Self Care | Payer: Self-pay | Source: Ambulatory Visit | Attending: Radiation Oncology

## 2023-11-05 DIAGNOSIS — Z191 Hormone sensitive malignancy status: Secondary | ICD-10-CM | POA: Diagnosis not present

## 2023-11-05 DIAGNOSIS — Z510 Encounter for antineoplastic radiation therapy: Secondary | ICD-10-CM | POA: Diagnosis not present

## 2023-11-05 LAB — RAD ONC ARIA SESSION SUMMARY
Course Elapsed Days: 2
Plan Fractions Treated to Date: 3
Plan Prescribed Dose Per Fraction: 1.8 Gy
Plan Total Fractions Prescribed: 25
Plan Total Prescribed Dose: 45 Gy
Reference Point Dosage Given to Date: 5.4 Gy
Reference Point Session Dosage Given: 1.8 Gy
Session Number: 3

## 2023-11-06 ENCOUNTER — Ambulatory Visit
Admission: RE | Admit: 2023-11-06 | Discharge: 2023-11-06 | Disposition: A | Discharge status: Home / Self Care | Payer: Self-pay | Source: Ambulatory Visit | Attending: Radiation Oncology | Admitting: Radiation Oncology

## 2023-11-06 ENCOUNTER — Other Ambulatory Visit: Payer: Self-pay

## 2023-11-06 ENCOUNTER — Ambulatory Visit
Admission: RE | Admit: 2023-11-06 | Discharge: 2023-11-06 | Disposition: A | Discharge status: Home / Self Care | Source: Ambulatory Visit | Attending: Radiation Oncology | Admitting: Radiation Oncology

## 2023-11-06 DIAGNOSIS — Z191 Hormone sensitive malignancy status: Secondary | ICD-10-CM | POA: Diagnosis not present

## 2023-11-06 DIAGNOSIS — Z510 Encounter for antineoplastic radiation therapy: Secondary | ICD-10-CM | POA: Diagnosis not present

## 2023-11-06 LAB — RAD ONC ARIA SESSION SUMMARY
Course Elapsed Days: 3
Plan Fractions Treated to Date: 4
Plan Prescribed Dose Per Fraction: 1.8 Gy
Plan Total Fractions Prescribed: 25
Plan Total Prescribed Dose: 45 Gy
Reference Point Dosage Given to Date: 7.2 Gy
Reference Point Session Dosage Given: 1.8 Gy
Session Number: 4

## 2023-11-09 ENCOUNTER — Ambulatory Visit (HOSPITAL_COMMUNITY): Admission: RE | Admit: 2023-11-09 | Payer: Medicare (Managed Care) | Source: Ambulatory Visit

## 2023-11-09 ENCOUNTER — Other Ambulatory Visit: Payer: Self-pay

## 2023-11-09 ENCOUNTER — Ambulatory Visit
Admission: RE | Admit: 2023-11-09 | Discharge: 2023-11-09 | Disposition: A | Discharge status: Home / Self Care | Payer: Self-pay | Source: Ambulatory Visit | Attending: Radiation Oncology | Admitting: Radiation Oncology

## 2023-11-09 ENCOUNTER — Encounter (HOSPITAL_COMMUNITY): Payer: Self-pay

## 2023-11-09 DIAGNOSIS — Z510 Encounter for antineoplastic radiation therapy: Secondary | ICD-10-CM | POA: Diagnosis not present

## 2023-11-09 DIAGNOSIS — Z191 Hormone sensitive malignancy status: Secondary | ICD-10-CM | POA: Diagnosis not present

## 2023-11-09 LAB — RAD ONC ARIA SESSION SUMMARY
Course Elapsed Days: 6
Plan Fractions Treated to Date: 5
Plan Prescribed Dose Per Fraction: 1.8 Gy
Plan Total Fractions Prescribed: 25
Plan Total Prescribed Dose: 45 Gy
Reference Point Dosage Given to Date: 9 Gy
Reference Point Session Dosage Given: 1.8 Gy
Session Number: 5

## 2023-11-10 ENCOUNTER — Ambulatory Visit
Admission: RE | Admit: 2023-11-10 | Discharge: 2023-11-10 | Disposition: A | Discharge status: Home / Self Care | Payer: Self-pay | Source: Ambulatory Visit | Attending: Radiation Oncology | Admitting: Radiation Oncology

## 2023-11-10 ENCOUNTER — Other Ambulatory Visit: Payer: Self-pay

## 2023-11-10 DIAGNOSIS — Z191 Hormone sensitive malignancy status: Secondary | ICD-10-CM | POA: Diagnosis not present

## 2023-11-10 DIAGNOSIS — Z510 Encounter for antineoplastic radiation therapy: Secondary | ICD-10-CM | POA: Diagnosis not present

## 2023-11-10 LAB — RAD ONC ARIA SESSION SUMMARY
Course Elapsed Days: 7
Plan Fractions Treated to Date: 6
Plan Prescribed Dose Per Fraction: 1.8 Gy
Plan Total Fractions Prescribed: 25
Plan Total Prescribed Dose: 45 Gy
Reference Point Dosage Given to Date: 10.8 Gy
Reference Point Session Dosage Given: 1.8 Gy
Session Number: 6

## 2023-11-11 ENCOUNTER — Ambulatory Visit
Admission: RE | Admit: 2023-11-11 | Discharge: 2023-11-11 | Disposition: A | Discharge status: Home / Self Care | Payer: Self-pay | Source: Ambulatory Visit | Attending: Radiation Oncology

## 2023-11-11 ENCOUNTER — Other Ambulatory Visit: Payer: Self-pay

## 2023-11-11 ENCOUNTER — Ambulatory Visit (HOSPITAL_COMMUNITY)
Admission: RE | Admit: 2023-11-11 | Discharge: 2023-11-11 | Disposition: A | Discharge status: Home / Self Care | Source: Ambulatory Visit | Attending: Cardiovascular Disease | Admitting: Cardiovascular Disease

## 2023-11-11 DIAGNOSIS — Z191 Hormone sensitive malignancy status: Secondary | ICD-10-CM | POA: Diagnosis not present

## 2023-11-11 DIAGNOSIS — I739 Peripheral vascular disease, unspecified: Secondary | ICD-10-CM | POA: Insufficient documentation

## 2023-11-11 DIAGNOSIS — E782 Mixed hyperlipidemia: Secondary | ICD-10-CM | POA: Diagnosis not present

## 2023-11-11 DIAGNOSIS — I6523 Occlusion and stenosis of bilateral carotid arteries: Secondary | ICD-10-CM | POA: Insufficient documentation

## 2023-11-11 DIAGNOSIS — Z510 Encounter for antineoplastic radiation therapy: Secondary | ICD-10-CM | POA: Diagnosis not present

## 2023-11-11 LAB — RAD ONC ARIA SESSION SUMMARY
Course Elapsed Days: 8
Plan Fractions Treated to Date: 7
Plan Prescribed Dose Per Fraction: 1.8 Gy
Plan Total Fractions Prescribed: 25
Plan Total Prescribed Dose: 45 Gy
Reference Point Dosage Given to Date: 12.6 Gy
Reference Point Session Dosage Given: 1.8 Gy
Session Number: 7

## 2023-11-12 ENCOUNTER — Other Ambulatory Visit: Payer: Self-pay

## 2023-11-12 ENCOUNTER — Ambulatory Visit
Admission: RE | Admit: 2023-11-12 | Discharge: 2023-11-12 | Discharge status: Home / Self Care | Source: Ambulatory Visit | Attending: Radiation Oncology

## 2023-11-12 ENCOUNTER — Other Ambulatory Visit: Payer: Self-pay | Admitting: Internal Medicine

## 2023-11-12 ENCOUNTER — Ambulatory Visit
Admission: RE | Admit: 2023-11-12 | Discharge: 2023-11-12 | Disposition: A | Discharge status: Home / Self Care | Payer: Self-pay | Source: Ambulatory Visit | Attending: Radiation Oncology | Admitting: Radiation Oncology

## 2023-11-12 DIAGNOSIS — E039 Hypothyroidism, unspecified: Secondary | ICD-10-CM

## 2023-11-12 DIAGNOSIS — Z191 Hormone sensitive malignancy status: Secondary | ICD-10-CM | POA: Diagnosis not present

## 2023-11-12 DIAGNOSIS — Z510 Encounter for antineoplastic radiation therapy: Secondary | ICD-10-CM | POA: Diagnosis not present

## 2023-11-12 LAB — RAD ONC ARIA SESSION SUMMARY
Course Elapsed Days: 9
Plan Fractions Treated to Date: 8
Plan Prescribed Dose Per Fraction: 1.8 Gy
Plan Total Fractions Prescribed: 25
Plan Total Prescribed Dose: 45 Gy
Reference Point Dosage Given to Date: 14.4 Gy
Reference Point Session Dosage Given: 1.8 Gy
Session Number: 8

## 2023-11-13 ENCOUNTER — Other Ambulatory Visit: Payer: Self-pay

## 2023-11-13 ENCOUNTER — Other Ambulatory Visit: Payer: Self-pay | Admitting: Internal Medicine

## 2023-11-13 ENCOUNTER — Ambulatory Visit
Admission: RE | Admit: 2023-11-13 | Discharge: 2023-11-13 | Disposition: A | Discharge status: Home / Self Care | Payer: Self-pay | Source: Ambulatory Visit | Attending: Radiation Oncology | Admitting: Radiation Oncology

## 2023-11-13 ENCOUNTER — Ambulatory Visit

## 2023-11-13 ENCOUNTER — Other Ambulatory Visit: Payer: Self-pay | Admitting: Physician Assistant

## 2023-11-13 DIAGNOSIS — Z191 Hormone sensitive malignancy status: Secondary | ICD-10-CM | POA: Diagnosis not present

## 2023-11-13 DIAGNOSIS — Z510 Encounter for antineoplastic radiation therapy: Secondary | ICD-10-CM | POA: Diagnosis not present

## 2023-11-13 LAB — RAD ONC ARIA SESSION SUMMARY
Course Elapsed Days: 10
Plan Fractions Treated to Date: 9
Plan Prescribed Dose Per Fraction: 1.8 Gy
Plan Total Fractions Prescribed: 25
Plan Total Prescribed Dose: 45 Gy
Reference Point Dosage Given to Date: 16.2 Gy
Reference Point Session Dosage Given: 1.8 Gy
Session Number: 9

## 2023-11-16 ENCOUNTER — Ambulatory Visit: Payer: Self-pay

## 2023-11-17 ENCOUNTER — Ambulatory Visit
Admission: RE | Admit: 2023-11-17 | Discharge: 2023-11-17 | Discharge status: Home / Self Care | Payer: Self-pay | Source: Ambulatory Visit | Attending: Radiation Oncology

## 2023-11-17 ENCOUNTER — Other Ambulatory Visit: Payer: Self-pay

## 2023-11-17 DIAGNOSIS — Z191 Hormone sensitive malignancy status: Secondary | ICD-10-CM | POA: Diagnosis not present

## 2023-11-17 DIAGNOSIS — Z510 Encounter for antineoplastic radiation therapy: Secondary | ICD-10-CM | POA: Diagnosis not present

## 2023-11-17 LAB — RAD ONC ARIA SESSION SUMMARY
Course Elapsed Days: 14
Plan Fractions Treated to Date: 10
Plan Prescribed Dose Per Fraction: 1.8 Gy
Plan Total Fractions Prescribed: 25
Plan Total Prescribed Dose: 45 Gy
Reference Point Dosage Given to Date: 18 Gy
Reference Point Session Dosage Given: 1.8 Gy
Session Number: 10

## 2023-11-18 ENCOUNTER — Ambulatory Visit
Admission: RE | Admit: 2023-11-18 | Discharge: 2023-11-18 | Disposition: A | Discharge status: Home / Self Care | Payer: Self-pay | Source: Ambulatory Visit | Attending: Radiation Oncology

## 2023-11-18 ENCOUNTER — Other Ambulatory Visit: Payer: Self-pay

## 2023-11-18 DIAGNOSIS — Z191 Hormone sensitive malignancy status: Secondary | ICD-10-CM | POA: Diagnosis not present

## 2023-11-18 DIAGNOSIS — Z510 Encounter for antineoplastic radiation therapy: Secondary | ICD-10-CM | POA: Diagnosis not present

## 2023-11-18 LAB — RAD ONC ARIA SESSION SUMMARY
Course Elapsed Days: 15
Plan Fractions Treated to Date: 11
Plan Prescribed Dose Per Fraction: 1.8 Gy
Plan Total Fractions Prescribed: 25
Plan Total Prescribed Dose: 45 Gy
Reference Point Dosage Given to Date: 19.8 Gy
Reference Point Session Dosage Given: 1.8 Gy
Session Number: 11

## 2023-11-19 ENCOUNTER — Ambulatory Visit
Admission: RE | Admit: 2023-11-19 | Discharge: 2023-11-19 | Disposition: A | Discharge status: Home / Self Care | Payer: Self-pay | Source: Ambulatory Visit | Attending: Radiation Oncology

## 2023-11-19 ENCOUNTER — Other Ambulatory Visit: Payer: Self-pay

## 2023-11-19 DIAGNOSIS — Z510 Encounter for antineoplastic radiation therapy: Secondary | ICD-10-CM | POA: Diagnosis not present

## 2023-11-19 DIAGNOSIS — Z191 Hormone sensitive malignancy status: Secondary | ICD-10-CM | POA: Diagnosis not present

## 2023-11-19 LAB — RAD ONC ARIA SESSION SUMMARY
Course Elapsed Days: 16
Plan Fractions Treated to Date: 12
Plan Prescribed Dose Per Fraction: 1.8 Gy
Plan Total Fractions Prescribed: 25
Plan Total Prescribed Dose: 45 Gy
Reference Point Dosage Given to Date: 21.6 Gy
Reference Point Session Dosage Given: 1.8 Gy
Session Number: 12

## 2023-11-20 ENCOUNTER — Other Ambulatory Visit: Payer: Self-pay

## 2023-11-20 ENCOUNTER — Ambulatory Visit
Admission: RE | Admit: 2023-11-20 | Discharge: 2023-11-20 | Disposition: A | Discharge status: Home / Self Care | Payer: Self-pay | Source: Ambulatory Visit | Attending: Radiation Oncology | Admitting: Radiation Oncology

## 2023-11-20 ENCOUNTER — Ambulatory Visit
Admission: RE | Admit: 2023-11-20 | Discharge: 2023-11-20 | Disposition: A | Discharge status: Home / Self Care | Source: Ambulatory Visit | Attending: Radiation Oncology | Admitting: Radiation Oncology

## 2023-11-20 DIAGNOSIS — Z510 Encounter for antineoplastic radiation therapy: Secondary | ICD-10-CM | POA: Diagnosis not present

## 2023-11-20 DIAGNOSIS — Z191 Hormone sensitive malignancy status: Secondary | ICD-10-CM | POA: Diagnosis not present

## 2023-11-20 LAB — RAD ONC ARIA SESSION SUMMARY
Course Elapsed Days: 17
Plan Fractions Treated to Date: 13
Plan Prescribed Dose Per Fraction: 1.8 Gy
Plan Total Fractions Prescribed: 25
Plan Total Prescribed Dose: 45 Gy
Reference Point Dosage Given to Date: 23.4 Gy
Reference Point Session Dosage Given: 1.8 Gy
Session Number: 13

## 2023-11-23 ENCOUNTER — Other Ambulatory Visit: Payer: Self-pay

## 2023-11-23 ENCOUNTER — Ambulatory Visit
Admission: RE | Admit: 2023-11-23 | Discharge: 2023-11-23 | Disposition: A | Discharge status: Home / Self Care | Payer: Self-pay | Source: Ambulatory Visit | Attending: Radiation Oncology | Admitting: Radiation Oncology

## 2023-11-23 DIAGNOSIS — D500 Iron deficiency anemia secondary to blood loss (chronic): Secondary | ICD-10-CM

## 2023-11-23 DIAGNOSIS — Z191 Hormone sensitive malignancy status: Secondary | ICD-10-CM | POA: Diagnosis not present

## 2023-11-23 DIAGNOSIS — Z510 Encounter for antineoplastic radiation therapy: Secondary | ICD-10-CM | POA: Diagnosis not present

## 2023-11-23 LAB — RAD ONC ARIA SESSION SUMMARY
Course Elapsed Days: 20
Plan Fractions Treated to Date: 14
Plan Prescribed Dose Per Fraction: 1.8 Gy
Plan Total Fractions Prescribed: 25
Plan Total Prescribed Dose: 45 Gy
Reference Point Dosage Given to Date: 25.2 Gy
Reference Point Session Dosage Given: 1.8 Gy
Session Number: 14

## 2023-11-24 ENCOUNTER — Ambulatory Visit
Admission: RE | Admit: 2023-11-24 | Discharge: 2023-11-24 | Disposition: A | Discharge status: Home / Self Care | Payer: Self-pay | Source: Ambulatory Visit | Attending: Radiation Oncology | Admitting: Radiation Oncology

## 2023-11-24 ENCOUNTER — Other Ambulatory Visit: Payer: Self-pay

## 2023-11-24 DIAGNOSIS — Z191 Hormone sensitive malignancy status: Secondary | ICD-10-CM | POA: Diagnosis not present

## 2023-11-24 DIAGNOSIS — Z510 Encounter for antineoplastic radiation therapy: Secondary | ICD-10-CM | POA: Diagnosis not present

## 2023-11-24 LAB — RAD ONC ARIA SESSION SUMMARY
Course Elapsed Days: 21
Plan Fractions Treated to Date: 15
Plan Prescribed Dose Per Fraction: 1.8 Gy
Plan Total Fractions Prescribed: 25
Plan Total Prescribed Dose: 45 Gy
Reference Point Dosage Given to Date: 27 Gy
Reference Point Session Dosage Given: 1.8 Gy
Session Number: 15

## 2023-11-25 ENCOUNTER — Ambulatory Visit: Payer: Self-pay

## 2023-11-26 ENCOUNTER — Ambulatory Visit
Admission: RE | Admit: 2023-11-26 | Discharge: 2023-11-26 | Disposition: A | Discharge status: Home / Self Care | Payer: Self-pay | Source: Ambulatory Visit | Attending: Radiation Oncology | Admitting: Radiation Oncology

## 2023-11-26 ENCOUNTER — Other Ambulatory Visit: Payer: Self-pay

## 2023-11-26 ENCOUNTER — Ambulatory Visit
Admission: RE | Admit: 2023-11-26 | Discharge: 2023-11-26 | Disposition: A | Discharge status: Home / Self Care | Source: Ambulatory Visit | Attending: Radiation Oncology

## 2023-11-26 DIAGNOSIS — Z191 Hormone sensitive malignancy status: Secondary | ICD-10-CM | POA: Diagnosis not present

## 2023-11-26 DIAGNOSIS — Z510 Encounter for antineoplastic radiation therapy: Secondary | ICD-10-CM | POA: Diagnosis not present

## 2023-11-26 LAB — RAD ONC ARIA SESSION SUMMARY
Course Elapsed Days: 23
Plan Fractions Treated to Date: 16
Plan Prescribed Dose Per Fraction: 1.8 Gy
Plan Total Fractions Prescribed: 25
Plan Total Prescribed Dose: 45 Gy
Reference Point Dosage Given to Date: 28.8 Gy
Reference Point Session Dosage Given: 1.8 Gy
Session Number: 16

## 2023-11-27 ENCOUNTER — Other Ambulatory Visit: Payer: Self-pay

## 2023-11-27 ENCOUNTER — Ambulatory Visit
Admission: RE | Admit: 2023-11-27 | Discharge: 2023-11-27 | Disposition: A | Discharge status: Home / Self Care | Payer: Self-pay | Source: Ambulatory Visit | Attending: Radiation Oncology | Admitting: Radiation Oncology

## 2023-11-27 ENCOUNTER — Ambulatory Visit: Admitting: Hematology

## 2023-11-27 ENCOUNTER — Inpatient Hospital Stay: Attending: Hematology

## 2023-11-27 ENCOUNTER — Inpatient Hospital Stay: Admitting: Hematology

## 2023-11-27 DIAGNOSIS — Z510 Encounter for antineoplastic radiation therapy: Secondary | ICD-10-CM | POA: Diagnosis not present

## 2023-11-27 DIAGNOSIS — Z191 Hormone sensitive malignancy status: Secondary | ICD-10-CM | POA: Diagnosis not present

## 2023-11-27 LAB — RAD ONC ARIA SESSION SUMMARY
Course Elapsed Days: 24
Plan Fractions Treated to Date: 17
Plan Prescribed Dose Per Fraction: 1.8 Gy
Plan Total Fractions Prescribed: 25
Plan Total Prescribed Dose: 45 Gy
Reference Point Dosage Given to Date: 30.6 Gy
Reference Point Session Dosage Given: 1.8 Gy
Session Number: 17

## 2023-11-30 ENCOUNTER — Other Ambulatory Visit: Payer: Self-pay

## 2023-11-30 ENCOUNTER — Ambulatory Visit
Admission: RE | Admit: 2023-11-30 | Discharge: 2023-11-30 | Disposition: A | Discharge status: Home / Self Care | Payer: Self-pay | Source: Ambulatory Visit | Attending: Radiation Oncology | Admitting: Radiation Oncology

## 2023-11-30 DIAGNOSIS — Z191 Hormone sensitive malignancy status: Secondary | ICD-10-CM | POA: Diagnosis not present

## 2023-11-30 DIAGNOSIS — Z510 Encounter for antineoplastic radiation therapy: Secondary | ICD-10-CM | POA: Diagnosis not present

## 2023-11-30 LAB — RAD ONC ARIA SESSION SUMMARY
Course Elapsed Days: 27
Plan Fractions Treated to Date: 18
Plan Prescribed Dose Per Fraction: 1.8 Gy
Plan Total Fractions Prescribed: 25
Plan Total Prescribed Dose: 45 Gy
Reference Point Dosage Given to Date: 32.4 Gy
Reference Point Session Dosage Given: 1.8 Gy
Session Number: 18

## 2023-12-01 ENCOUNTER — Ambulatory Visit
Admission: RE | Admit: 2023-12-01 | Discharge: 2023-12-01 | Disposition: A | Payer: Self-pay | Source: Ambulatory Visit | Attending: Radiation Oncology

## 2023-12-01 ENCOUNTER — Other Ambulatory Visit: Payer: Self-pay

## 2023-12-01 DIAGNOSIS — Z191 Hormone sensitive malignancy status: Secondary | ICD-10-CM | POA: Diagnosis not present

## 2023-12-01 DIAGNOSIS — Z510 Encounter for antineoplastic radiation therapy: Secondary | ICD-10-CM | POA: Diagnosis not present

## 2023-12-01 LAB — RAD ONC ARIA SESSION SUMMARY
Course Elapsed Days: 28
Plan Fractions Treated to Date: 19
Plan Prescribed Dose Per Fraction: 1.8 Gy
Plan Total Fractions Prescribed: 25
Plan Total Prescribed Dose: 45 Gy
Reference Point Dosage Given to Date: 34.2 Gy
Reference Point Session Dosage Given: 1.8 Gy
Session Number: 19

## 2023-12-02 ENCOUNTER — Other Ambulatory Visit: Payer: Self-pay

## 2023-12-02 ENCOUNTER — Ambulatory Visit
Admission: RE | Admit: 2023-12-02 | Discharge: 2023-12-02 | Disposition: A | Discharge status: Home / Self Care | Payer: Self-pay | Source: Ambulatory Visit | Attending: Radiation Oncology | Admitting: Radiation Oncology

## 2023-12-02 DIAGNOSIS — Z191 Hormone sensitive malignancy status: Secondary | ICD-10-CM | POA: Diagnosis not present

## 2023-12-02 DIAGNOSIS — Z510 Encounter for antineoplastic radiation therapy: Secondary | ICD-10-CM | POA: Diagnosis not present

## 2023-12-02 DIAGNOSIS — R931 Abnormal findings on diagnostic imaging of heart and coronary circulation: Secondary | ICD-10-CM | POA: Diagnosis not present

## 2023-12-02 DIAGNOSIS — C61 Malignant neoplasm of prostate: Secondary | ICD-10-CM | POA: Diagnosis present

## 2023-12-02 LAB — RAD ONC ARIA SESSION SUMMARY
Course Elapsed Days: 29
Plan Fractions Treated to Date: 20
Plan Prescribed Dose Per Fraction: 1.8 Gy
Plan Total Fractions Prescribed: 25
Plan Total Prescribed Dose: 45 Gy
Reference Point Dosage Given to Date: 36 Gy
Reference Point Session Dosage Given: 1.8 Gy
Session Number: 20

## 2023-12-03 ENCOUNTER — Ambulatory Visit
Admission: RE | Admit: 2023-12-03 | Discharge: 2023-12-03 | Disposition: A | Discharge status: Home / Self Care | Payer: Self-pay | Source: Ambulatory Visit | Attending: Radiation Oncology | Admitting: Radiation Oncology

## 2023-12-03 ENCOUNTER — Inpatient Hospital Stay: Admission: RE | Admit: 2023-12-03 | Source: Ambulatory Visit

## 2023-12-03 ENCOUNTER — Other Ambulatory Visit: Payer: Self-pay

## 2023-12-03 DIAGNOSIS — Z510 Encounter for antineoplastic radiation therapy: Secondary | ICD-10-CM | POA: Diagnosis not present

## 2023-12-03 DIAGNOSIS — Z191 Hormone sensitive malignancy status: Secondary | ICD-10-CM | POA: Diagnosis not present

## 2023-12-03 DIAGNOSIS — C61 Malignant neoplasm of prostate: Secondary | ICD-10-CM | POA: Diagnosis not present

## 2023-12-03 LAB — RAD ONC ARIA SESSION SUMMARY
Course Elapsed Days: 30
Plan Fractions Treated to Date: 21
Plan Prescribed Dose Per Fraction: 1.8 Gy
Plan Total Fractions Prescribed: 25
Plan Total Prescribed Dose: 45 Gy
Reference Point Dosage Given to Date: 37.8 Gy
Reference Point Session Dosage Given: 1.8 Gy
Session Number: 21

## 2023-12-04 ENCOUNTER — Ambulatory Visit
Admission: RE | Admit: 2023-12-04 | Discharge: 2023-12-04 | Disposition: A | Discharge status: Home / Self Care | Source: Ambulatory Visit | Attending: Radiation Oncology | Admitting: Radiation Oncology

## 2023-12-04 ENCOUNTER — Ambulatory Visit: Payer: Self-pay

## 2023-12-04 ENCOUNTER — Other Ambulatory Visit: Payer: Self-pay

## 2023-12-04 DIAGNOSIS — Z191 Hormone sensitive malignancy status: Secondary | ICD-10-CM | POA: Diagnosis not present

## 2023-12-04 DIAGNOSIS — C61 Malignant neoplasm of prostate: Secondary | ICD-10-CM | POA: Diagnosis not present

## 2023-12-04 DIAGNOSIS — Z510 Encounter for antineoplastic radiation therapy: Secondary | ICD-10-CM | POA: Diagnosis not present

## 2023-12-04 LAB — RAD ONC ARIA SESSION SUMMARY
Course Elapsed Days: 31
Plan Fractions Treated to Date: 22
Plan Prescribed Dose Per Fraction: 1.8 Gy
Plan Total Fractions Prescribed: 25
Plan Total Prescribed Dose: 45 Gy
Reference Point Dosage Given to Date: 39.6 Gy
Reference Point Session Dosage Given: 1.8 Gy
Session Number: 22

## 2023-12-07 ENCOUNTER — Ambulatory Visit
Admission: RE | Admit: 2023-12-07 | Discharge: 2023-12-07 | Disposition: A | Source: Ambulatory Visit | Attending: Radiation Oncology

## 2023-12-07 ENCOUNTER — Ambulatory Visit: Payer: Self-pay

## 2023-12-07 ENCOUNTER — Other Ambulatory Visit: Payer: Self-pay

## 2023-12-07 ENCOUNTER — Encounter: Payer: Self-pay | Admitting: Hematology

## 2023-12-07 DIAGNOSIS — C61 Malignant neoplasm of prostate: Secondary | ICD-10-CM | POA: Diagnosis not present

## 2023-12-07 DIAGNOSIS — Z510 Encounter for antineoplastic radiation therapy: Secondary | ICD-10-CM | POA: Diagnosis not present

## 2023-12-07 DIAGNOSIS — Z191 Hormone sensitive malignancy status: Secondary | ICD-10-CM | POA: Diagnosis not present

## 2023-12-07 LAB — RAD ONC ARIA SESSION SUMMARY
Course Elapsed Days: 34
Plan Fractions Treated to Date: 23
Plan Prescribed Dose Per Fraction: 1.8 Gy
Plan Total Fractions Prescribed: 25
Plan Total Prescribed Dose: 45 Gy
Reference Point Dosage Given to Date: 41.4 Gy
Reference Point Session Dosage Given: 1.8 Gy
Session Number: 23

## 2023-12-08 ENCOUNTER — Other Ambulatory Visit: Payer: Self-pay

## 2023-12-08 ENCOUNTER — Ambulatory Visit

## 2023-12-08 ENCOUNTER — Ambulatory Visit: Payer: Self-pay

## 2023-12-08 ENCOUNTER — Ambulatory Visit
Admission: RE | Admit: 2023-12-08 | Discharge: 2023-12-08 | Disposition: A | Discharge status: Home / Self Care | Source: Ambulatory Visit | Attending: Radiation Oncology | Admitting: Radiation Oncology

## 2023-12-08 DIAGNOSIS — Z191 Hormone sensitive malignancy status: Secondary | ICD-10-CM | POA: Diagnosis not present

## 2023-12-08 DIAGNOSIS — Z510 Encounter for antineoplastic radiation therapy: Secondary | ICD-10-CM | POA: Diagnosis not present

## 2023-12-08 DIAGNOSIS — C61 Malignant neoplasm of prostate: Secondary | ICD-10-CM | POA: Diagnosis not present

## 2023-12-08 LAB — RAD ONC ARIA SESSION SUMMARY
Course Elapsed Days: 35
Plan Fractions Treated to Date: 24
Plan Prescribed Dose Per Fraction: 1.8 Gy
Plan Total Fractions Prescribed: 25
Plan Total Prescribed Dose: 45 Gy
Reference Point Dosage Given to Date: 43.2 Gy
Reference Point Session Dosage Given: 1.8 Gy
Session Number: 24

## 2023-12-09 ENCOUNTER — Ambulatory Visit

## 2023-12-09 ENCOUNTER — Ambulatory Visit (INDEPENDENT_AMBULATORY_CARE_PROVIDER_SITE_OTHER)

## 2023-12-09 ENCOUNTER — Other Ambulatory Visit: Payer: Self-pay

## 2023-12-09 ENCOUNTER — Ambulatory Visit: Payer: Self-pay

## 2023-12-09 ENCOUNTER — Telehealth: Payer: Self-pay

## 2023-12-09 VITALS — BP 124/64 | HR 65 | Temp 98.5°F | Ht 69.0 in | Wt 146.2 lb

## 2023-12-09 DIAGNOSIS — C61 Malignant neoplasm of prostate: Secondary | ICD-10-CM | POA: Diagnosis not present

## 2023-12-09 DIAGNOSIS — Z0000 Encounter for general adult medical examination without abnormal findings: Secondary | ICD-10-CM | POA: Diagnosis not present

## 2023-12-09 DIAGNOSIS — Z510 Encounter for antineoplastic radiation therapy: Secondary | ICD-10-CM | POA: Diagnosis not present

## 2023-12-09 DIAGNOSIS — Z191 Hormone sensitive malignancy status: Secondary | ICD-10-CM | POA: Diagnosis not present

## 2023-12-09 DIAGNOSIS — Z23 Encounter for immunization: Secondary | ICD-10-CM | POA: Diagnosis not present

## 2023-12-09 LAB — RAD ONC ARIA SESSION SUMMARY
Course Elapsed Days: 36
Plan Fractions Treated to Date: 25
Plan Prescribed Dose Per Fraction: 1.8 Gy
Plan Total Fractions Prescribed: 25
Plan Total Prescribed Dose: 45 Gy
Reference Point Dosage Given to Date: 45 Gy
Reference Point Session Dosage Given: 1.8 Gy
Session Number: 25

## 2023-12-09 NOTE — Progress Notes (Signed)
 Subjective:   COOLIDGE GOSSARD is a 78 y.o. who presents for a Medicare Wellness preventive visit.  As a reminder, Annual Wellness Visits don't include a physical exam, and some assessments may be limited, especially if this visit is performed virtually. We may recommend an in-person follow-up visit with your provider if needed.  Visit Complete: In person    Persons Participating in Visit: Patient.  AWV Questionnaire: No: Patient Medicare AWV questionnaire was not completed prior to this visit.  Cardiac Risk Factors include: advanced age (>46men, >80 women);dyslipidemia;hypertension;male gender     Objective:    Today's Vitals   12/09/23 1407  BP: 124/64  Pulse: 65  Temp: 98.5 F (36.9 C)  TempSrc: Oral  SpO2: 93%  Weight: 146 lb 3.2 oz (66.3 kg)  Height: 5' 9 (1.753 m)   Body mass index is 21.59 kg/m.     12/09/2023    2:13 PM 10/16/2023    8:03 AM 06/30/2023    9:02 AM 06/04/2022   12:28 PM 04/25/2021    2:18 PM 04/19/2020    2:39 PM 09/28/2019    3:15 PM  Advanced Directives  Does Patient Have a Medical Advance Directive? Yes No Yes Yes Yes Yes Yes  Type of Advance Directive Living will  Healthcare Power of [place];Living will Healthcare Power of [place];Living will Healthcare Power of [place];Living will Healthcare Power of [place];Living will Living will;Healthcare Power of Attorney  Copy of Healthcare Power of Attorney in Chart?    No - copy requested No - copy requested No - copy requested No - copy requested  Would patient like information on creating a medical advance directive?  No - Patient declined         Current Medications (verified) Outpatient Encounter Medications as of 12/09/2023  Medication Sig   albuterol  (VENTOLIN  HFA) 108 (90 Base) MCG/ACT inhaler TAKE 2 PUFFS BY MOUTH EVERY 6 HOURS AS NEEDED FOR WHEEZE OR SHORTNESS OF BREATH   amLODipine  (NORVASC ) 10 MG tablet TAKE 1 TABLET BY MOUTH EVERYDAY AT BEDTIME   aspirin  EC 81 MG tablet Take 81 mg by  mouth daily.   atorvastatin  (LIPITOR) 10 MG tablet TAKE ONE TAB BY MOUTH DAILY, EXCEPT SUNDAYS START 6/10   Cholecalciferol (VITAMIN D3) 1000 units CAPS Take 1 capsule by mouth daily.   diclofenac  (VOLTAREN ) 75 MG EC tablet TAKE 1 TABLET BY MOUTH TWICE A DAY   fluticasone  (FLONASE ) 50 MCG/ACT nasal spray Place 1 spray into both nostrils daily for 3 days.   guaiFENesin -codeine  100-10 MG/5ML syrup Take 5 mLs by mouth every 6 (six) hours as needed for cough.   hydrALAZINE  (APRESOLINE ) 25 MG tablet TAKE 1 TABLET (25 MG TOTAL) BY MOUTH 3 (THREE) TIMES DAILY. ONE TABLET BY MOUTH AT BREAKFAST LUNCH AND BEDTIME.   iron  polysaccharides (NIFEREX) 150 MG capsule Take 1 capsule (150 mg total) by mouth daily.   levothyroxine  (SYNTHROID ) 112 MCG tablet TAKE 1 TABLET BY MOUTH EVERY DAY   Multiple Vitamin (MULTIVITAMIN WITH MINERALS) TABS tablet Take 1 tablet by mouth daily.   pantoprazole  (PROTONIX ) 40 MG tablet Take 1 tablet (40 mg total) by mouth daily.   sertraline  (ZOLOFT ) 25 MG tablet Take 1 tablet (25 mg total) by mouth daily.   sildenafil (VIAGRA) 100 MG tablet Take 1 tablet by mouth See admin instructions.   valsartan  (DIOVAN ) 160 MG tablet TAKE 1 TABLET BY MOUTH EVERY DAY   levothyroxine  (SYNTHROID ) 100 MCG tablet TAKE 1 TABLET BY MOUTH EVERY DAY (Patient not taking:  Reported on 12/09/2023)   No facility-administered encounter medications on file as of 12/09/2023.    Allergies (verified) Lisinopril   History: Past Medical History:  Diagnosis Date   Blood clot in abdominal vein 1999   treated at [facility])    Carotid artery disease    Dyslipidemia    FHx: heart disease    GERD (gastroesophageal reflux disease)    History of blood transfusion 8-9 yrs ago   HTN (hypertension)    Peripheral vascular disease    Past Surgical History:  Procedure Laterality Date   ANKLE SURGERY Right yrs ago   arm surgery Left yrs ago   2 rods inserted, 1 rod later removed   COLONOSCOPY WITH  PROPOFOL  N/A 09/16/2013   Procedure: COLONOSCOPY WITH PROPOFOL ;  Surgeon: Belvie JONETTA Just, MD;  Location: [facility];  Service: Endoscopy;  Laterality: N/A;   GOLD SEED IMPLANT N/A 10/16/2023   Procedure: INSERTION, GOLD SEEDS;  Surgeon: Selma Donnice SAUNDERS, MD;  Location: [facility] OR;  Service: Urology;  Laterality: N/A;  GOLD SEED IMPLANTS AND SPACE OAR   LOWER EXTREMITY ANGIOGRAM N/A 07/24/2011   Procedure: LOWER EXTREMITY ANGIOGRAM;  Surgeon: Dorn JINNY Lesches, MD;  Location: [facility] CATH LAB;  Service: Cardiovascular;  Laterality: N/A;   SPACE OAR INSTILLATION N/A 10/16/2023   Procedure: INJECTION, HYDROGEL SPACER;  Surgeon: Selma Donnice SAUNDERS, MD;  Location: [facility] OR;  Service: Urology;  Laterality: N/A;   stent in abdominal vein  4-5 yrs ago   US  ECHOCARDIOGRAPHY  07-20-2007   EF 55-60%   Family History  Problem Relation Age of Onset   Heart disease Brother    Heart disease Mother    Cancer Father        ? type   Social History   Socioeconomic History   Marital status: Married    Spouse name: Not on file   Number of children: Not on file   Years of education: Not on file   Highest education level: Not on file  Occupational History   Occupation: retired  Tobacco Use   Smoking status: Former    Current packs/day: 0.00    Average packs/day: 0.8 packs/day for 47.0 years (35.2 ttl pk-yrs)    Types: Cigarettes    Start date: 07/07/1962    Quit date: 07/01/2009    Years since quitting: 14.4   Smokeless tobacco: Never   Tobacco comments:    0.75pack x 30 years, quit in 2011, resumed smoking in 2012, one pack lasts a week    Quit 2 weeks ago  Vaping Use   Vaping status: Never Used  Substance and Sexual Activity   Alcohol use: Not Currently    Comment: only occ   Drug use: No   Sexual activity: Yes  Other Topics Concern   Not on file  Social History Narrative   Not on file   Social Drivers of Health   Financial Resource Strain: Low Risk  (12/09/2023)   Overall Financial Resource Strain (CARDIA)     Difficulty of Paying Living Expenses: Not hard at all  Food Insecurity: No Food Insecurity (12/09/2023)   Hunger Vital Sign    Worried About Running Out of Food in the Last Year: Never true    Ran Out of Food in the Last Year: Never true  Transportation Needs: No Transportation Needs (12/09/2023)   PRAPARE - Administrator, Civil Service (Medical): No    Lack of Transportation (Non-Medical): No  Physical Activity:  Sufficiently Active (12/09/2023)   Exercise Vital Sign    Days of Exercise per Week: 7 days    Minutes of Exercise per Session: 60 min  Stress: No Stress Concern Present (12/09/2023)   [employer] of Occupational Health - Occupational Stress Questionnaire    Feeling of Stress: Not at all  Social Connections: Moderately Isolated (12/09/2023)   Social Connection and Isolation Panel    Frequency of Communication with Friends and Family: More than three times a week    Frequency of Social Gatherings with Friends and Family: Once a week    Attends Religious Services: Never    Database administrator or Organizations: No    Attends Banker Meetings: Never    Marital Status: Married    Tobacco Counseling Counseling given: Not Answered Tobacco comments: 0.75pack x 30 years, quit in 2011, resumed smoking in 2012, one pack lasts a week Quit 2 weeks ago    Clinical Intake:  Pre-visit preparation completed: Yes  Pain : No/denies pain     Nutritional Status: BMI of 19-24  Normal Nutritional Risks: None Diabetes: No  No results found for: HGBA1C   How often do you need to have someone help you when you read instructions, pamphlets, or other written materials from your doctor or pharmacy?: 1 - Never  Interpreter Needed?: No  Information entered by :: NAllen LPN   Activities of Daily Living     12/09/2023    2:09 PM 10/16/2023    8:14 AM  In your present state of health, do you have any difficulty performing the following activities:   Hearing? 0 0  Vision? 0 0  Difficulty concentrating or making decisions? 0 0  Walking or climbing stairs? 0   Dressing or bathing? 0   Doing errands, shopping? 0   Preparing Food and eating ? N   Using the Toilet? N   In the past six months, have you accidently leaked urine? N   Do you have problems with loss of bowel control? N   Managing your Medications? Y   Comment daughter sets up   Managing your Finances? N   Housekeeping or managing your Housekeeping? N     Patient Care Team: Jarold Medici, MD as PCP - General (Internal Medicine) Court Dorn PARAS, MD as PCP - Cardiology (Cardiology) Court Dorn PARAS, MD as Consulting Physician (Cardiology) Vertell Pont, RN as Oncology Nurse Navigator  I have updated your Care Teams any recent Medical Services you may have received from other providers in the past year.     Assessment:   This is a routine wellness examination for Gwendolyn.  Hearing/Vision screen Hearing Screening - Comments:: Denies hearing issues Vision Screening - Comments:: Regular eye exams, [facility]   Goals Addressed             This Visit's Progress    Patient Stated       12/09/2023, get through cancer       Depression Screen     12/09/2023    2:14 PM 02/05/2023   11:33 AM 11/04/2022   10:59 AM 07/30/2022   10:17 AM 06/04/2022   12:29 PM 01/29/2022    2:12 PM 04/25/2021    2:18 PM  PHQ 2/9 Scores  PHQ - 2 Score 0 0 0 0 0 0 0  PHQ- 9 Score 0 1 0 0       Fall Risk     12/09/2023    2:13 PM 08/06/2023  10:09 AM 02/05/2023   11:33 AM 11/04/2022   10:59 AM 07/30/2022   10:17 AM  Fall Risk   Falls in the past year? 0 0 0 0 0  Number falls in past yr: 0 0 0 0 0  Injury with Fall? 0 0 0 0 0  Risk for fall due to : Medication side effect No Fall Risks No Fall Risks No Fall Risks No Fall Risks  Follow up Falls evaluation completed;Falls prevention discussed Falls evaluation completed Falls evaluation completed Falls evaluation completed Falls  evaluation completed    MEDICARE RISK AT HOME:  Medicare Risk at Home Any stairs in or around the home?: Yes If so, are there any without handrails?: No Home free of loose throw rugs in walkways, pet beds, electrical cords, etc?: Yes Adequate lighting in your home to reduce risk of falls?: Yes Life alert?: No Use of a cane, walker or w/c?: No Grab bars in the bathroom?: Yes Shower chair or bench in shower?: No Elevated toilet seat or a handicapped toilet?: Yes  TIMED UP AND GO:  Was the test performed?  Yes  Length of time to ambulate 10 feet: 5 sec Gait steady and fast without use of assistive device  Cognitive Function: 6CIT completed        12/09/2023    2:14 PM 06/04/2022   12:29 PM 04/25/2021    2:19 PM 04/19/2020    2:40 PM 09/28/2019    3:17 PM  6CIT Screen  What Year? 0 points 0 points 0 points 0 points 0 points  What month? 0 points 0 points 0 points 0 points 0 points  What time? 0 points 0 points 0 points 0 points 0 points  Count back from 20 0 points 2 points 0 points 0 points 0 points  Months in reverse 2 points 0 points 2 points 4 points 4 points  Repeat phrase 6 points 0 points 6 points 2 points 2 points  Total Score 8 points 2 points 8 points 6 points 6 points    Immunizations Immunization History  Administered Date(s) Administered    sv, Bivalent, Protein Subunit Rsvpref,pf (Abrysvo) 09/29/2022   Fluad Quad(high Dose 65+) 12/04/2020, 11/20/2021   Fluad Trivalent(High Dose 65+) 11/11/2022   INFLUENZA, HIGH DOSE SEASONAL PF 11/04/2016, 12/02/2017, 01/01/2019, 12/28/2019   Influenza-Unspecified 12/04/2017, 01/01/2019, 11/01/2021, 12/02/2022   PFIZER(Purple Top)SARS-COV-2 Vaccination 04/14/2019, 05/09/2019, 01/11/2020, 07/17/2020   Pfizer(Comirnaty)Fall Seasonal Vaccine 12 years and older 02/05/2023   Pneumococcal Conjugate-13 12/04/2017   Pneumococcal Polysaccharide-23 11/30/2014   Pneumococcal-Unspecified 11/30/2014   Polio, Unspecified 06[birth year]18/1953,  12/17/1953   Tdap 07/30/2022   Zoster Recombinant(Shingrix ) 09/10/2021, 01/30/2022    Screening Tests Health Maintenance  Topic Date Due   Influenza Vaccine  10/02/2023   COVID-19 Vaccine (6 - Pfizer risk 2024-25 season) 11/02/2023   Lung Cancer Screening  09/13/2024   Medicare Annual Wellness (AWV)  12/08/2024   DTaP/Tdap/Td (2 - Td or Tdap) 07/29/2032   Pneumococcal Vaccine: 50+ Years  Completed   Hepatitis C Screening  Completed   Zoster Vaccines- Shingrix   Completed   Meningococcal B Vaccine  Aged Out   Colonoscopy  Discontinued    Health Maintenance Items Addressed: Vaccines Given today: flu  Additional Screening:  Vision Screening: Recommended annual ophthalmology exams for early detection of glaucoma and other disorders of the eye. Is the patient up to date with their annual eye exam?  Yes  Who is the provider or what is the name of the office in which the  patient attends annual eye exams? [facility]  Dental Screening: Recommended annual dental exams for proper oral hygiene  Community Resource Referral / Chronic Care Management: CRR required this visit?  No   CCM required this visit?  No   Plan:    I have personally reviewed and noted the following in the patient's chart:   Medical and social history Use of alcohol, tobacco or illicit drugs  Current medications and supplements including opioid prescriptions. Patient is not currently taking opioid prescriptions. Functional ability and status Nutritional status Physical activity Advanced directives List of other physicians Hospitalizations, surgeries, and ER visits in previous 12 months Vitals Screenings to include cognitive, depression, and falls Referrals and appointments  In addition, I have reviewed and discussed with patient certain preventive protocols, quality metrics, and best practice recommendations. A written personalized care plan for preventive services as well as general preventive health  recommendations were provided to patient.   Ardella FORBES Dawn, LPN   [id number]/03/[id number]   After Visit Summary: (In Person-Printed) AVS printed and given to the patient  Notes: Nothing significant to report at this time.

## 2023-12-09 NOTE — Telephone Encounter (Signed)
 Scheduled flu vaccine, but saw has appointment on 10/13

## 2023-12-09 NOTE — Patient Instructions (Addendum)
 Shawn Fernandez,  Thank you for taking the time for your Medicare Wellness Visit. I appreciate your continued commitment to your health goals. Please review the care plan we discussed, and feel free to reach out if I can assist you further.  Medicare recommends these wellness visits once per year to help you and your care team stay ahead of potential health issues. These visits are designed to focus on prevention, allowing your provider to concentrate on managing your acute and chronic conditions during your regular appointments.  Please note that Annual Wellness Visits do not include a physical exam. Some assessments may be limited, especially if the visit was conducted virtually. If needed, we may recommend a separate in-person follow-up with your provider.  Ongoing Care Seeing your primary care provider every 3 to 6 months helps us  monitor your health and provide consistent, personalized care.   Referrals If a referral was made during today's visit and you haven't received any updates within two weeks, please contact the referred provider directly to check on the status.  Recommended Screenings:  Health Maintenance  Topic Date Due   COVID-19 Vaccine (6 - Pfizer risk 2024-25 season) 11/02/2023   Screening for Lung Cancer  09/13/2024   Medicare Annual Wellness Visit  12/08/2024   DTaP/Tdap/Td vaccine (2 - Td or Tdap) 07/29/2032   Pneumococcal Vaccine for age over 63  Completed   Flu Shot  Completed   Hepatitis C Screening  Completed   Zoster (Shingles) Vaccine  Completed   Meningitis B Vaccine  Aged Out   Colon Cancer Screening  Discontinued       12/09/2023    2:13 PM  Advanced Directives  Does Patient Have a Medical Advance Directive? Yes  Type of Advance Directive Living will   Advance Care Planning is important because it: Ensures you receive medical care that aligns with your values, goals, and preferences. Provides guidance to your family and loved ones, reducing the  emotional burden of decision-making during critical moments.  Vision: Annual vision screenings are recommended for early detection of glaucoma, cataracts, and diabetic retinopathy. These exams can also reveal signs of chronic conditions such as diabetes and high blood pressure.  Dental: Annual dental screenings help detect early signs of oral cancer, gum disease, and other conditions linked to overall health, including heart disease and diabetes.  Please see the attached documents for additional preventive care recommendations.

## 2023-12-10 ENCOUNTER — Ambulatory Visit
Admission: RE | Admit: 2023-12-10 | Discharge: 2023-12-10 | Disposition: A | Discharge status: Home / Self Care | Source: Ambulatory Visit | Attending: Radiation Oncology | Admitting: Radiation Oncology

## 2023-12-10 ENCOUNTER — Other Ambulatory Visit: Payer: Self-pay

## 2023-12-10 ENCOUNTER — Ambulatory Visit: Payer: Self-pay

## 2023-12-10 DIAGNOSIS — Z510 Encounter for antineoplastic radiation therapy: Secondary | ICD-10-CM | POA: Diagnosis not present

## 2023-12-10 DIAGNOSIS — C61 Malignant neoplasm of prostate: Secondary | ICD-10-CM | POA: Diagnosis not present

## 2023-12-10 DIAGNOSIS — Z191 Hormone sensitive malignancy status: Secondary | ICD-10-CM | POA: Diagnosis not present

## 2023-12-10 LAB — RAD ONC ARIA SESSION SUMMARY
Course Elapsed Days: 37
Plan Fractions Treated to Date: 1
Plan Prescribed Dose Per Fraction: 2 Gy
Plan Total Fractions Prescribed: 15
Plan Total Prescribed Dose: 30 Gy
Reference Point Dosage Given to Date: 2 Gy
Reference Point Session Dosage Given: 2 Gy
Session Number: 26

## 2023-12-11 ENCOUNTER — Other Ambulatory Visit: Payer: Self-pay

## 2023-12-11 ENCOUNTER — Ambulatory Visit
Admission: RE | Admit: 2023-12-11 | Discharge: 2023-12-11 | Disposition: A | Discharge status: Home / Self Care | Payer: Self-pay | Source: Ambulatory Visit | Attending: Radiation Oncology | Admitting: Radiation Oncology

## 2023-12-11 ENCOUNTER — Ambulatory Visit

## 2023-12-11 DIAGNOSIS — C61 Malignant neoplasm of prostate: Secondary | ICD-10-CM | POA: Diagnosis not present

## 2023-12-11 DIAGNOSIS — Z510 Encounter for antineoplastic radiation therapy: Secondary | ICD-10-CM | POA: Diagnosis not present

## 2023-12-11 DIAGNOSIS — Z191 Hormone sensitive malignancy status: Secondary | ICD-10-CM | POA: Diagnosis not present

## 2023-12-11 LAB — RAD ONC ARIA SESSION SUMMARY
Course Elapsed Days: 38
Plan Fractions Treated to Date: 1
Plan Prescribed Dose Per Fraction: 2 Gy
Plan Total Fractions Prescribed: 14
Plan Total Prescribed Dose: 28 Gy
Reference Point Dosage Given to Date: 4 Gy
Reference Point Session Dosage Given: 2 Gy
Session Number: 27

## 2023-12-14 ENCOUNTER — Ambulatory Visit: Payer: Self-pay | Admitting: Internal Medicine

## 2023-12-14 ENCOUNTER — Ambulatory Visit
Admission: RE | Admit: 2023-12-14 | Discharge: 2023-12-14 | Disposition: A | Payer: Self-pay | Source: Ambulatory Visit | Attending: Radiation Oncology

## 2023-12-14 ENCOUNTER — Other Ambulatory Visit: Payer: Self-pay

## 2023-12-14 ENCOUNTER — Encounter (HOSPITAL_COMMUNITY): Payer: Self-pay

## 2023-12-14 ENCOUNTER — Other Ambulatory Visit: Payer: Self-pay | Admitting: Cardiovascular Disease

## 2023-12-14 ENCOUNTER — Encounter: Payer: Self-pay | Admitting: Internal Medicine

## 2023-12-14 ENCOUNTER — Telehealth (HOSPITAL_COMMUNITY): Payer: Self-pay | Admitting: Emergency Medicine

## 2023-12-14 VITALS — BP 132/80 | HR 64 | Temp 98.3°F | Ht 69.0 in | Wt 146.2 lb

## 2023-12-14 DIAGNOSIS — E039 Hypothyroidism, unspecified: Secondary | ICD-10-CM

## 2023-12-14 DIAGNOSIS — E782 Mixed hyperlipidemia: Secondary | ICD-10-CM

## 2023-12-14 DIAGNOSIS — J439 Emphysema, unspecified: Secondary | ICD-10-CM | POA: Diagnosis not present

## 2023-12-14 DIAGNOSIS — I2510 Atherosclerotic heart disease of native coronary artery without angina pectoris: Secondary | ICD-10-CM

## 2023-12-14 DIAGNOSIS — Z191 Hormone sensitive malignancy status: Secondary | ICD-10-CM | POA: Diagnosis not present

## 2023-12-14 DIAGNOSIS — I119 Hypertensive heart disease without heart failure: Secondary | ICD-10-CM | POA: Diagnosis not present

## 2023-12-14 DIAGNOSIS — Z510 Encounter for antineoplastic radiation therapy: Secondary | ICD-10-CM | POA: Diagnosis not present

## 2023-12-14 DIAGNOSIS — R931 Abnormal findings on diagnostic imaging of heart and coronary circulation: Secondary | ICD-10-CM

## 2023-12-14 DIAGNOSIS — I272 Pulmonary hypertension, unspecified: Secondary | ICD-10-CM | POA: Insufficient documentation

## 2023-12-14 DIAGNOSIS — C61 Malignant neoplasm of prostate: Secondary | ICD-10-CM | POA: Diagnosis not present

## 2023-12-14 LAB — RAD ONC ARIA SESSION SUMMARY
Course Elapsed Days: 41
Plan Fractions Treated to Date: 2
Plan Prescribed Dose Per Fraction: 2 Gy
Plan Total Fractions Prescribed: 14
Plan Total Prescribed Dose: 28 Gy
Reference Point Dosage Given to Date: 6 Gy
Reference Point Session Dosage Given: 2 Gy
Session Number: 28

## 2023-12-14 NOTE — Telephone Encounter (Signed)
 Unable to leave vm Rockwell Alexandria RN Navigator Cardiac Imaging [facility] [phone] Office  [phone] Cell

## 2023-12-14 NOTE — Patient Instructions (Signed)

## 2023-12-14 NOTE — Progress Notes (Signed)
 I,Victoria T Emmitt, CMA,acting as a Neurosurgeon for Catheryn LOISE Slocumb, MD.,have documented all relevant documentation on the behalf of Catheryn LOISE Slocumb, MD,as directed by  Catheryn LOISE Slocumb, MD while in the presence of Catheryn LOISE Slocumb, MD.  Subjective:  Patient ID: Shawn Fernandez , male    DOB: December 05, 1945 , 78 y.o.   MRN: [id number]  Chief Complaint  Patient presents with   Hypothyroidism    He presents today for a bp and thyroid  follow up. Patient is compliant with his meds. He denies having any headaches, chest pain and shortness of breath. He reports no specific questions or concerns.    Hypertension    HPI Discussed the use of AI scribe software for clinical note transcription with the patient, who gave verbal consent to proceed.  History of Present Illness Shawn Fernandez is a 78 year old male with hypertension and coronary artery disease who presents for management of thyroid  and blood pressure issues.  He is currently undergoing radiation therapy and reports no issues. He is followed by a primary urologist at [facility] and had a session today. He underwent a cardiac PET scan today. Emphysema was seen on a prior cardiac CT scan and his calcium  score was elevated. No chest pain or shortness of breath.  His cholesterol was noted to be high in June, with a low-density lipoprotein (LDL) level of 107, which needs to be less than 70 due to coronary artery plaque. He has resumed taking atorvastatin  daily.  He is on thyroid  medication, taking 112 mcg daily, which was adjusted from a previous regimen. No palpitations and he feels good on the current dose. He has lost four pounds recently and reports a good appetite.  He continues to take Protonix  for reflux, sertraline  for mood, valsartan  160 mg daily, and amlodipine  10 mg for blood pressure management.  He has a history of emphysema, which was seen on a CT scan. Pulmonary hypertension was mentioned as a possible diagnosis, but his echo earlier this  year did not show elevated pulmonary artery pressure. No recent leg cramps.   Hypertension This is a chronic problem. The current episode started more than 1 year ago. The problem has been gradually improving since onset. The problem is controlled. Pertinent negatives include no blurred vision. Risk factors for coronary artery disease include male gender and dyslipidemia. Past treatments include calcium  channel blockers, angiotensin blockers and direct vasodilators.     Past Medical History:  Diagnosis Date   Blood clot in abdominal vein 1999   treated at [facility]    Carotid artery disease    Dyslipidemia    FHx: heart disease    GERD (gastroesophageal reflux disease)    History of blood transfusion 8-9 yrs ago   HTN (hypertension)    Peripheral vascular disease      Family History  Problem Relation Age of Onset   Heart disease Brother    Heart disease Mother    Cancer Father        ? type     Current Outpatient Medications:    albuterol  (VENTOLIN  HFA) 108 (90 Base) MCG/ACT inhaler, TAKE 2 PUFFS BY MOUTH EVERY 6 HOURS AS NEEDED FOR WHEEZE OR SHORTNESS OF BREATH, Disp: 8.5 each, Rfl: 1   amLODipine  (NORVASC ) 10 MG tablet, TAKE 1 TABLET BY MOUTH EVERYDAY AT BEDTIME, Disp: 90 tablet, Rfl: 2   aspirin  EC 81 MG tablet, Take 81 mg by mouth daily., Disp: , Rfl:  atorvastatin  (LIPITOR) 10 MG tablet, TAKE ONE TAB BY MOUTH DAILY, EXCEPT SUNDAYS START 6/10, Disp: 90 tablet, Rfl: 3   Cholecalciferol (VITAMIN D3) 1000 units CAPS, Take 1 capsule by mouth daily., Disp: , Rfl:    diclofenac  (VOLTAREN ) 75 MG EC tablet, TAKE 1 TABLET BY MOUTH TWICE A DAY, Disp: 30 tablet, Rfl: 2   fluticasone  (FLONASE ) 50 MCG/ACT nasal spray, Place 1 spray into both nostrils daily for 3 days., Disp: 16 g, Rfl: 0   guaiFENesin -codeine  100-10 MG/5ML syrup, Take 5 mLs by mouth every 6 (six) hours as needed for cough., Disp: 80 mL, Rfl: 0   hydrALAZINE  (APRESOLINE ) 25 MG tablet, TAKE 1 TABLET (25 MG  TOTAL) BY MOUTH 3 (THREE) TIMES DAILY. ONE TABLET BY MOUTH AT BREAKFAST LUNCH AND BEDTIME., Disp: 270 tablet, Rfl: 2   iron  polysaccharides (NIFEREX) 150 MG capsule, Take 1 capsule (150 mg total) by mouth daily., Disp: 90 capsule, Rfl: 2   levothyroxine  (SYNTHROID ) 112 MCG tablet, TAKE 1 TABLET BY MOUTH EVERY DAY, Disp: 90 tablet, Rfl: 1   Multiple Vitamin (MULTIVITAMIN WITH MINERALS) TABS tablet, Take 1 tablet by mouth daily., Disp: , Rfl:    pantoprazole  (PROTONIX ) 40 MG tablet, Take 1 tablet (40 mg total) by mouth daily., Disp: 90 tablet, Rfl: 2   sertraline  (ZOLOFT ) 25 MG tablet, Take 1 tablet (25 mg total) by mouth daily., Disp: 90 tablet, Rfl: 2   sildenafil (VIAGRA) 100 MG tablet, Take 1 tablet by mouth See admin instructions., Disp: , Rfl:    valsartan  (DIOVAN ) 160 MG tablet, TAKE 1 TABLET BY MOUTH EVERY DAY, Disp: 90 tablet, Rfl: 1   Allergies  Allergen Reactions   Lisinopril Cough     Review of Systems  Constitutional: Negative.   Eyes:  Negative for blurred vision.  Respiratory: Negative.    Cardiovascular: Negative.   Gastrointestinal: Negative.   Endocrine: Negative.   Skin: Negative.   Allergic/Immunologic: Negative.   Hematological: Negative.      Today's Vitals   12/14/23 1154  BP: 132/80  Pulse: 64  Temp: 98.3 F (36.8 C)  SpO2: 98%  Weight: 146 lb 3.2 oz (66.3 kg)  Height: 5' 9 (1.753 m)   Body mass index is 21.59 kg/m.  Wt Readings from Last 3 Encounters:  12/14/23 146 lb 3.2 oz (66.3 kg)  12/09/23 146 lb 3.2 oz (66.3 kg)  10/16/23 150 lb (68 kg)     Objective:  Physical Exam Vitals and nursing note reviewed.  Constitutional:      Appearance: Normal appearance.  HENT:     Head: Normocephalic and atraumatic.  Eyes:     Extraocular Movements: Extraocular movements intact.  Cardiovascular:     Rate and Rhythm: Normal rate and regular rhythm.     Heart sounds: Normal heart sounds.  Pulmonary:     Effort: Pulmonary effort is normal.     Breath  sounds: Normal breath sounds.  Musculoskeletal:     Cervical back: Normal range of motion.  Skin:    General: Skin is warm.  Neurological:     General: No focal deficit present.     Mental Status: He is alert.  Psychiatric:        Mood and Affect: Mood normal.         Assessment And Plan:  Acquired hypothyroidism Assessment & Plan: Chronic, currently taking levothyroxine  112mcg daily.   - Recheck thyroid  function tests.  Orders: -     TSH + free T4  Hypertensive heart  disease without heart failure Assessment & Plan: Chronic, fair control.  Goal BP <130/80.  Requires monitoring.  He will continue with valsartan  160mg , amlodipine  10mg  daily and hydralazine  25mg  three times daily.  - Follow low sodium diet.   Orders: -     CMP14+EGFR -     Lipid panel  Atherosclerosis of native coronary artery of native heart without angina pectoris Assessment & Plan: LDL target is less than 70 due to coronary artery plaque. Currently on atorvastatin . - encouraged to follow heart healthy lifestyle.   Orders: -     CMP14+EGFR -     Lipid panel  Emphysema, unspecified (HCC) Assessment & Plan: Identified on cardiac CT, likely smoking-related. Asymptomatic. - consider Pulmonary referral if he becomes symptomatic    Return if symptoms worsen or fail to improve.  Patient was given opportunity to ask questions. Patient verbalized understanding of the plan and was able to repeat key elements of the plan. All questions were answered to their satisfaction.   I, Catheryn LOISE Slocumb, MD, have reviewed all documentation for this visit. The documentation on 12/14/23 for the exam, diagnosis, procedures, and orders are all accurate and complete.   IF YOU HAVE BEEN REFERRED TO A SPECIALIST, IT MAY TAKE 1-2 WEEKS TO SCHEDULE/PROCESS THE REFERRAL. IF YOU HAVE NOT HEARD FROM US /SPECIALIST IN TWO WEEKS, PLEASE GIVE US  A CALL AT [phone].   THE PATIENT IS ENCOURAGED TO PRACTICE SOCIAL DISTANCING  DUE TO THE COVID-19 PANDEMIC.

## 2023-12-15 ENCOUNTER — Encounter (HOSPITAL_BASED_OUTPATIENT_CLINIC_OR_DEPARTMENT_OTHER)
Admission: RE | Admit: 2023-12-15 | Discharge: 2023-12-15 | Disposition: A | Discharge status: Home / Self Care | Source: Ambulatory Visit | Attending: Cardiovascular Disease | Admitting: Cardiovascular Disease

## 2023-12-15 ENCOUNTER — Ambulatory Visit
Admission: RE | Admit: 2023-12-15 | Discharge: 2023-12-15 | Disposition: A | Payer: Self-pay | Source: Ambulatory Visit | Attending: Radiation Oncology

## 2023-12-15 ENCOUNTER — Ambulatory Visit: Payer: Self-pay | Admitting: Cardiovascular Disease

## 2023-12-15 ENCOUNTER — Other Ambulatory Visit: Payer: Self-pay

## 2023-12-15 DIAGNOSIS — I2510 Atherosclerotic heart disease of native coronary artery without angina pectoris: Secondary | ICD-10-CM | POA: Diagnosis not present

## 2023-12-15 DIAGNOSIS — Z191 Hormone sensitive malignancy status: Secondary | ICD-10-CM | POA: Diagnosis not present

## 2023-12-15 DIAGNOSIS — R931 Abnormal findings on diagnostic imaging of heart and coronary circulation: Secondary | ICD-10-CM | POA: Diagnosis not present

## 2023-12-15 DIAGNOSIS — Z510 Encounter for antineoplastic radiation therapy: Secondary | ICD-10-CM | POA: Diagnosis not present

## 2023-12-15 DIAGNOSIS — C61 Malignant neoplasm of prostate: Secondary | ICD-10-CM | POA: Diagnosis not present

## 2023-12-15 LAB — NM PET CT CARDIAC PERFUSION MULTI W/ABSOLUTE BLOODFLOW
LV dias vol: 95 mL (ref 62–150)
MBFR: 2.18
Nuc Rest EF: 51 %
Nuc Stress EF: 63 %
Peak HR: 80 {beats}/min
Rest HR: 61 {beats}/min
Rest MBF: 1.09 ml/g/min
Rest Nuclear Isotope Dose: 17.1 mCi
ST Depression (mm): 0 mm
Stress MBF: 2.38 ml/g/min
Stress Nuclear Isotope Dose: 17.1 mCi
TID: 0.94

## 2023-12-15 LAB — LIPID PANEL
Chol/HDL Ratio: 2.5 ratio (ref 0.0–5.0)
Cholesterol, Total: 163 mg/dL (ref 100–199)
HDL: 64 mg/dL (ref >39)
LDL Chol Calc (NIH): 82 mg/dL (ref 0–99)
Triglycerides: 90 mg/dL (ref 0–149)
VLDL Cholesterol Cal: 17 mg/dL (ref 5–40)

## 2023-12-15 LAB — CMP14+EGFR
ALT: 11 IU/L (ref 0–44)
AST: 23 IU/L (ref 0–40)
Albumin: 4.4 g/dL (ref 3.8–4.8)
Alkaline Phosphatase: 82 IU/L (ref 47–123)
BUN/Creatinine Ratio: 10 (ref 10–24)
BUN: 9 mg/dL (ref 8–27)
Bilirubin Total: 0.6 mg/dL (ref 0.0–1.2)
CO2: 24 mmol/L (ref 20–29)
Calcium: 9.8 mg/dL (ref 8.6–10.2)
Chloride: 105 mmol/L (ref 96–106)
Creatinine, Ser: 0.9 mg/dL (ref 0.76–1.27)
Globulin, Total: 2.7 g/dL (ref 1.5–4.5)
Glucose: 87 mg/dL (ref 70–99)
Potassium: 4.4 mmol/L (ref 3.5–5.2)
Sodium: 143 mmol/L (ref 134–144)
Total Protein: 7.1 g/dL (ref 6.0–8.5)
eGFR: 87 mL/min/1.73 (ref >59)

## 2023-12-15 LAB — RAD ONC ARIA SESSION SUMMARY
Course Elapsed Days: 42
Plan Fractions Treated to Date: 3
Plan Prescribed Dose Per Fraction: 2 Gy
Plan Total Fractions Prescribed: 14
Plan Total Prescribed Dose: 28 Gy
Reference Point Dosage Given to Date: 8 Gy
Reference Point Session Dosage Given: 2 Gy
Session Number: 29

## 2023-12-15 LAB — TSH+FREE T4
Free T4: 2.4 ng/dL — ABNORMAL HIGH (ref 0.82–1.77)
TSH: 0.023 u[IU]/mL — ABNORMAL LOW (ref 0.450–4.500)

## 2023-12-15 MED ORDER — RUBIDIUM RB82 GENERATOR (RUBYFILL)
17.1700 | PACK | Freq: Once | INTRAVENOUS | Status: AC
  Administered 2023-12-15: 17.17 via INTRAVENOUS

## 2023-12-15 MED ORDER — REGADENOSON 0.4 MG/5ML IV SOLN
INTRAVENOUS | Status: AC
  Filled 2023-12-15: qty 5

## 2023-12-15 MED ORDER — REGADENOSON 0.4 MG/5ML IV SOLN
0.4000 mg | Freq: Once | INTRAVENOUS | Status: AC
  Administered 2023-12-15: 0.4 mg via INTRAVENOUS

## 2023-12-15 MED ORDER — RUBIDIUM RB82 GENERATOR (RUBYFILL)
17.0600 | PACK | Freq: Once | INTRAVENOUS | Status: AC
  Administered 2023-12-15: 17.06 via INTRAVENOUS

## 2023-12-15 NOTE — Progress Notes (Signed)
 Pt. Tolerated lexi scan well.

## 2023-12-16 ENCOUNTER — Other Ambulatory Visit: Payer: Self-pay

## 2023-12-16 ENCOUNTER — Ambulatory Visit: Admission: RE | Admit: 2023-12-16 | Discharge: 2023-12-16 | Payer: Self-pay | Attending: Radiation Oncology

## 2023-12-16 DIAGNOSIS — C61 Malignant neoplasm of prostate: Secondary | ICD-10-CM | POA: Diagnosis not present

## 2023-12-16 DIAGNOSIS — Z191 Hormone sensitive malignancy status: Secondary | ICD-10-CM | POA: Diagnosis not present

## 2023-12-16 DIAGNOSIS — Z510 Encounter for antineoplastic radiation therapy: Secondary | ICD-10-CM | POA: Diagnosis not present

## 2023-12-16 LAB — RAD ONC ARIA SESSION SUMMARY
Course Elapsed Days: 43
Plan Fractions Treated to Date: 4
Plan Prescribed Dose Per Fraction: 2 Gy
Plan Total Fractions Prescribed: 14
Plan Total Prescribed Dose: 28 Gy
Reference Point Dosage Given to Date: 10 Gy
Reference Point Session Dosage Given: 2 Gy
Session Number: 30

## 2023-12-17 ENCOUNTER — Ambulatory Visit
Admission: RE | Admit: 2023-12-17 | Discharge: 2023-12-17 | Disposition: A | Discharge status: Home / Self Care | Payer: Self-pay | Source: Ambulatory Visit | Attending: Radiation Oncology | Admitting: Radiation Oncology

## 2023-12-17 ENCOUNTER — Other Ambulatory Visit: Payer: Self-pay

## 2023-12-17 DIAGNOSIS — Z510 Encounter for antineoplastic radiation therapy: Secondary | ICD-10-CM | POA: Diagnosis not present

## 2023-12-17 DIAGNOSIS — C61 Malignant neoplasm of prostate: Secondary | ICD-10-CM | POA: Diagnosis not present

## 2023-12-17 DIAGNOSIS — Z191 Hormone sensitive malignancy status: Secondary | ICD-10-CM | POA: Diagnosis not present

## 2023-12-17 LAB — RAD ONC ARIA SESSION SUMMARY
Course Elapsed Days: 44
Plan Fractions Treated to Date: 5
Plan Prescribed Dose Per Fraction: 2 Gy
Plan Total Fractions Prescribed: 14
Plan Total Prescribed Dose: 28 Gy
Reference Point Dosage Given to Date: 12 Gy
Reference Point Session Dosage Given: 2 Gy
Session Number: 31

## 2023-12-18 ENCOUNTER — Ambulatory Visit
Admission: RE | Admit: 2023-12-18 | Discharge: 2023-12-18 | Disposition: A | Discharge status: Home / Self Care | Source: Ambulatory Visit | Attending: Radiation Oncology | Admitting: Radiation Oncology

## 2023-12-18 ENCOUNTER — Ambulatory Visit
Admission: RE | Admit: 2023-12-18 | Discharge: 2023-12-18 | Disposition: A | Discharge status: Home / Self Care | Payer: Self-pay | Source: Ambulatory Visit | Attending: Radiation Oncology | Admitting: Radiation Oncology

## 2023-12-18 ENCOUNTER — Other Ambulatory Visit: Payer: Self-pay

## 2023-12-18 DIAGNOSIS — C61 Malignant neoplasm of prostate: Secondary | ICD-10-CM | POA: Diagnosis not present

## 2023-12-18 DIAGNOSIS — Z191 Hormone sensitive malignancy status: Secondary | ICD-10-CM | POA: Diagnosis not present

## 2023-12-18 DIAGNOSIS — Z510 Encounter for antineoplastic radiation therapy: Secondary | ICD-10-CM | POA: Diagnosis not present

## 2023-12-18 LAB — RAD ONC ARIA SESSION SUMMARY
Course Elapsed Days: 45
Plan Fractions Treated to Date: 6
Plan Prescribed Dose Per Fraction: 2 Gy
Plan Total Fractions Prescribed: 14
Plan Total Prescribed Dose: 28 Gy
Reference Point Dosage Given to Date: 14 Gy
Reference Point Session Dosage Given: 2 Gy
Session Number: 32

## 2023-12-19 NOTE — Assessment & Plan Note (Signed)
 Chronic, currently taking levothyroxine  112mcg daily.   - Recheck thyroid  function tests.

## 2023-12-19 NOTE — Assessment & Plan Note (Signed)
 LDL target is less than 70 due to coronary artery plaque. Currently on atorvastatin . - encouraged to follow heart healthy lifestyle.

## 2023-12-19 NOTE — Assessment & Plan Note (Signed)
 Chronic, fair control.  Goal BP <130/80.  Requires monitoring.  He will continue with valsartan  160mg , amlodipine  10mg  daily and hydralazine  25mg  three times daily.  - Follow low sodium diet.

## 2023-12-19 NOTE — Assessment & Plan Note (Signed)
 Identified on cardiac CT, likely smoking-related. Asymptomatic. - consider Pulmonary referral if he becomes symptomatic

## 2023-12-21 ENCOUNTER — Ambulatory Visit: Payer: Self-pay | Admitting: Internal Medicine

## 2023-12-21 ENCOUNTER — Ambulatory Visit
Admission: RE | Admit: 2023-12-21 | Discharge: 2023-12-21 | Disposition: A | Payer: Self-pay | Source: Ambulatory Visit | Attending: Radiation Oncology

## 2023-12-21 ENCOUNTER — Other Ambulatory Visit: Payer: Self-pay

## 2023-12-21 DIAGNOSIS — C61 Malignant neoplasm of prostate: Secondary | ICD-10-CM | POA: Diagnosis not present

## 2023-12-21 DIAGNOSIS — Z191 Hormone sensitive malignancy status: Secondary | ICD-10-CM | POA: Diagnosis not present

## 2023-12-21 DIAGNOSIS — Z510 Encounter for antineoplastic radiation therapy: Secondary | ICD-10-CM | POA: Diagnosis not present

## 2023-12-21 LAB — RAD ONC ARIA SESSION SUMMARY
Course Elapsed Days: 48
Plan Fractions Treated to Date: 7
Plan Prescribed Dose Per Fraction: 2 Gy
Plan Total Fractions Prescribed: 14
Plan Total Prescribed Dose: 28 Gy
Reference Point Dosage Given to Date: 16 Gy
Reference Point Session Dosage Given: 2 Gy
Session Number: 33

## 2023-12-22 ENCOUNTER — Ambulatory Visit

## 2023-12-22 ENCOUNTER — Other Ambulatory Visit: Payer: Self-pay

## 2023-12-22 ENCOUNTER — Ambulatory Visit
Admission: RE | Admit: 2023-12-22 | Discharge: 2023-12-22 | Disposition: A | Payer: Self-pay | Source: Ambulatory Visit | Attending: Radiation Oncology

## 2023-12-22 DIAGNOSIS — Z191 Hormone sensitive malignancy status: Secondary | ICD-10-CM | POA: Diagnosis not present

## 2023-12-22 DIAGNOSIS — Z510 Encounter for antineoplastic radiation therapy: Secondary | ICD-10-CM | POA: Diagnosis not present

## 2023-12-22 DIAGNOSIS — C61 Malignant neoplasm of prostate: Secondary | ICD-10-CM | POA: Diagnosis not present

## 2023-12-22 LAB — RAD ONC ARIA SESSION SUMMARY
Course Elapsed Days: 49
Plan Fractions Treated to Date: 8
Plan Prescribed Dose Per Fraction: 2 Gy
Plan Total Fractions Prescribed: 14
Plan Total Prescribed Dose: 28 Gy
Reference Point Dosage Given to Date: 18 Gy
Reference Point Session Dosage Given: 2 Gy
Session Number: 34

## 2023-12-23 ENCOUNTER — Ambulatory Visit
Admission: RE | Admit: 2023-12-23 | Discharge: 2023-12-23 | Disposition: A | Discharge status: Home / Self Care | Payer: Self-pay | Source: Ambulatory Visit | Attending: Radiation Oncology | Admitting: Radiation Oncology

## 2023-12-23 ENCOUNTER — Other Ambulatory Visit: Payer: Self-pay

## 2023-12-23 DIAGNOSIS — Z510 Encounter for antineoplastic radiation therapy: Secondary | ICD-10-CM | POA: Diagnosis not present

## 2023-12-23 DIAGNOSIS — Z191 Hormone sensitive malignancy status: Secondary | ICD-10-CM | POA: Diagnosis not present

## 2023-12-23 DIAGNOSIS — C61 Malignant neoplasm of prostate: Secondary | ICD-10-CM | POA: Diagnosis not present

## 2023-12-23 LAB — RAD ONC ARIA SESSION SUMMARY
Course Elapsed Days: 50
Plan Fractions Treated to Date: 9
Plan Prescribed Dose Per Fraction: 2 Gy
Plan Total Fractions Prescribed: 14
Plan Total Prescribed Dose: 28 Gy
Reference Point Dosage Given to Date: 20 Gy
Reference Point Session Dosage Given: 2 Gy
Session Number: 35

## 2023-12-24 ENCOUNTER — Ambulatory Visit
Admission: RE | Admit: 2023-12-24 | Discharge: 2023-12-24 | Disposition: A | Source: Ambulatory Visit | Attending: Radiation Oncology

## 2023-12-24 ENCOUNTER — Ambulatory Visit
Admission: RE | Admit: 2023-12-24 | Discharge: 2023-12-24 | Disposition: A | Discharge status: Home / Self Care | Source: Ambulatory Visit | Attending: Radiation Oncology | Admitting: Radiation Oncology

## 2023-12-24 ENCOUNTER — Other Ambulatory Visit: Payer: Self-pay

## 2023-12-24 ENCOUNTER — Ambulatory Visit: Payer: Self-pay

## 2023-12-24 DIAGNOSIS — Z191 Hormone sensitive malignancy status: Secondary | ICD-10-CM | POA: Diagnosis not present

## 2023-12-24 DIAGNOSIS — Z510 Encounter for antineoplastic radiation therapy: Secondary | ICD-10-CM | POA: Diagnosis not present

## 2023-12-24 DIAGNOSIS — C61 Malignant neoplasm of prostate: Secondary | ICD-10-CM | POA: Diagnosis not present

## 2023-12-24 LAB — RAD ONC ARIA SESSION SUMMARY
Course Elapsed Days: 51
Plan Fractions Treated to Date: 10
Plan Prescribed Dose Per Fraction: 2 Gy
Plan Total Fractions Prescribed: 14
Plan Total Prescribed Dose: 28 Gy
Reference Point Dosage Given to Date: 22 Gy
Reference Point Session Dosage Given: 2 Gy
Session Number: 36

## 2023-12-25 ENCOUNTER — Ambulatory Visit
Admission: RE | Admit: 2023-12-25 | Discharge: 2023-12-25 | Disposition: A | Discharge status: Home / Self Care | Source: Ambulatory Visit | Attending: Radiation Oncology | Admitting: Radiation Oncology

## 2023-12-25 ENCOUNTER — Other Ambulatory Visit: Payer: Self-pay

## 2023-12-25 DIAGNOSIS — Z191 Hormone sensitive malignancy status: Secondary | ICD-10-CM | POA: Diagnosis not present

## 2023-12-25 DIAGNOSIS — Z510 Encounter for antineoplastic radiation therapy: Secondary | ICD-10-CM | POA: Diagnosis not present

## 2023-12-25 DIAGNOSIS — C61 Malignant neoplasm of prostate: Secondary | ICD-10-CM | POA: Diagnosis not present

## 2023-12-25 LAB — RAD ONC ARIA SESSION SUMMARY
Course Elapsed Days: 52
Plan Fractions Treated to Date: 11
Plan Prescribed Dose Per Fraction: 2 Gy
Plan Total Fractions Prescribed: 14
Plan Total Prescribed Dose: 28 Gy
Reference Point Dosage Given to Date: 24 Gy
Reference Point Session Dosage Given: 2 Gy
Session Number: 37

## 2023-12-27 ENCOUNTER — Ambulatory Visit

## 2023-12-28 ENCOUNTER — Ambulatory Visit
Admission: RE | Admit: 2023-12-28 | Discharge: 2023-12-28 | Disposition: A | Source: Ambulatory Visit | Attending: Radiation Oncology

## 2023-12-28 ENCOUNTER — Ambulatory Visit

## 2023-12-28 ENCOUNTER — Other Ambulatory Visit: Payer: Self-pay

## 2023-12-28 DIAGNOSIS — Z191 Hormone sensitive malignancy status: Secondary | ICD-10-CM | POA: Diagnosis not present

## 2023-12-28 DIAGNOSIS — Z510 Encounter for antineoplastic radiation therapy: Secondary | ICD-10-CM | POA: Diagnosis not present

## 2023-12-28 DIAGNOSIS — C61 Malignant neoplasm of prostate: Secondary | ICD-10-CM | POA: Diagnosis not present

## 2023-12-28 LAB — RAD ONC ARIA SESSION SUMMARY
Course Elapsed Days: 55
Plan Fractions Treated to Date: 12
Plan Prescribed Dose Per Fraction: 2 Gy
Plan Total Fractions Prescribed: 14
Plan Total Prescribed Dose: 28 Gy
Reference Point Dosage Given to Date: 26 Gy
Reference Point Session Dosage Given: 2 Gy
Session Number: 38

## 2023-12-29 ENCOUNTER — Ambulatory Visit
Admission: RE | Admit: 2023-12-29 | Discharge: 2023-12-29 | Disposition: A | Discharge status: Home / Self Care | Source: Ambulatory Visit | Attending: Radiation Oncology | Admitting: Radiation Oncology

## 2023-12-29 ENCOUNTER — Other Ambulatory Visit: Payer: Self-pay

## 2023-12-29 ENCOUNTER — Ambulatory Visit

## 2023-12-29 DIAGNOSIS — Z191 Hormone sensitive malignancy status: Secondary | ICD-10-CM | POA: Diagnosis not present

## 2023-12-29 DIAGNOSIS — C61 Malignant neoplasm of prostate: Secondary | ICD-10-CM | POA: Diagnosis not present

## 2023-12-29 DIAGNOSIS — Z510 Encounter for antineoplastic radiation therapy: Secondary | ICD-10-CM | POA: Diagnosis not present

## 2023-12-29 LAB — RAD ONC ARIA SESSION SUMMARY
Course Elapsed Days: 56
Plan Fractions Treated to Date: 13
Plan Prescribed Dose Per Fraction: 2 Gy
Plan Total Fractions Prescribed: 14
Plan Total Prescribed Dose: 28 Gy
Reference Point Dosage Given to Date: 28 Gy
Reference Point Session Dosage Given: 2 Gy
Session Number: 39

## 2023-12-30 ENCOUNTER — Ambulatory Visit
Admission: RE | Admit: 2023-12-30 | Discharge: 2023-12-30 | Disposition: A | Source: Ambulatory Visit | Attending: Radiation Oncology

## 2023-12-30 ENCOUNTER — Other Ambulatory Visit: Payer: Self-pay

## 2023-12-30 DIAGNOSIS — Z191 Hormone sensitive malignancy status: Secondary | ICD-10-CM | POA: Diagnosis not present

## 2023-12-30 DIAGNOSIS — C61 Malignant neoplasm of prostate: Secondary | ICD-10-CM | POA: Diagnosis not present

## 2023-12-30 DIAGNOSIS — Z510 Encounter for antineoplastic radiation therapy: Secondary | ICD-10-CM | POA: Diagnosis not present

## 2023-12-30 LAB — RAD ONC ARIA SESSION SUMMARY
Course Elapsed Days: 57
Plan Fractions Treated to Date: 14
Plan Prescribed Dose Per Fraction: 2 Gy
Plan Total Fractions Prescribed: 14
Plan Total Prescribed Dose: 28 Gy
Reference Point Dosage Given to Date: 30 Gy
Reference Point Session Dosage Given: 2 Gy
Session Number: 40

## 2023-12-31 NOTE — Radiation Completion Notes (Addendum)
" °  Radiation Oncology         [phone] ________________________________  Name: Shawn Fernandez MRN: [id number]  Date: 12/30/2023  DOB: 08/27/1945  Referring Physician: BENJAMIN HERRICK, M.D. Date of Service: 2023-12-31 Radiation Oncologist: Adina Barge, M.D. [facility]     RADIATION ONCOLOGY END OF TREATMENT NOTE     Diagnosis: 78 y.o. gentleman with Stage T1c adenocarcinoma of the prostate with Gleason score of 4+4, and PSA of 10.6.   Intent: Curative     ==========DELIVERED PLANS==========  First Treatment Date: 2023-11-03 Last Treatment Date: 2023-12-30   Plan Name: Prostate_Pel Site: Prostate Technique: IMRT Mode: Photon Dose Per Fraction: 1.8 Gy Prescribed Dose (Delivered / Prescribed): 45 Gy / 45 Gy Prescribed Fxs (Delivered / Prescribed): 25 / 25   Plan Name: Prostate_Bst Site: Prostate Technique: IMRT Mode: Photon Dose Per Fraction: 2 Gy Prescribed Dose (Delivered / Prescribed): 2 Gy / 30 Gy Prescribed Fxs (Delivered / Prescribed): 1 / 15   Plan Name: [id number] Site: Prostate Technique: IMRT Mode: Photon Dose Per Fraction: 2 Gy Prescribed Dose (Delivered / Prescribed): 28 Gy / 28 Gy Prescribed Fxs (Delivered / Prescribed): 14 / 14     ==========ON TREATMENT VISIT DATES========== 2023-11-06, 2023-11-12, 2023-11-20, 2023-11-26, 2023-12-04, 2023-12-10, 2023-12-18, 2023-12-24    See weekly On Treatment Notes in Epic for details in the Media tab (listed as Progress notes on the On Treatment Visit Dates listed above).  He tolerated the radiation treatments relatively well with only mild increased LUTS and modest fatigue.  The patient will receive a call in about one month from the radiation oncology department. He will continue follow up with Dr. Cam as well.  ------------------------------------------------   Donnice Barge, MD [facility]  Radiation Oncology Direct Dial: [phone]  Fax:  [phone] [email or web address]  Skype  LinkedIn    "

## 2024-01-02 ENCOUNTER — Ambulatory Visit

## 2024-01-04 ENCOUNTER — Encounter: Payer: Self-pay | Admitting: Radiology

## 2024-01-05 ENCOUNTER — Other Ambulatory Visit: Payer: Self-pay | Admitting: Internal Medicine

## 2024-01-05 ENCOUNTER — Other Ambulatory Visit: Payer: Self-pay | Admitting: Physician Assistant

## 2024-01-05 DIAGNOSIS — I10 Essential (primary) hypertension: Secondary | ICD-10-CM

## 2024-01-05 NOTE — Telephone Encounter (Signed)
 Has not been seen 1.5 years

## 2024-01-18 ENCOUNTER — Inpatient Hospital Stay (HOSPITAL_BASED_OUTPATIENT_CLINIC_OR_DEPARTMENT_OTHER): Admitting: Hematology

## 2024-01-18 ENCOUNTER — Inpatient Hospital Stay: Attending: Hematology

## 2024-01-18 VITALS — BP 134/61 | HR 68 | Temp 97.9°F | Wt 141.3 lb

## 2024-01-18 DIAGNOSIS — D500 Iron deficiency anemia secondary to blood loss (chronic): Secondary | ICD-10-CM

## 2024-01-18 LAB — CBC WITH DIFFERENTIAL (CANCER CENTER ONLY)
Abs Immature Granulocytes: 0.01 K/uL (ref 0.00–0.07)
Basophils Absolute: 0.1 K/uL (ref 0.0–0.1)
Basophils Relative: 1 %
Eosinophils Absolute: 0.3 K/uL (ref 0.0–0.5)
Eosinophils Relative: 6 %
HCT: 35.3 % — ABNORMAL LOW (ref 39.0–52.0)
Hemoglobin: 11.4 g/dL — ABNORMAL LOW (ref 13.0–17.0)
Immature Granulocytes: 0 %
Lymphocytes Relative: 15 %
Lymphs Abs: 0.8 K/uL (ref 0.7–4.0)
MCH: 29.5 pg (ref 26.0–34.0)
MCHC: 32.3 g/dL (ref 30.0–36.0)
MCV: 91.2 fL (ref 80.0–100.0)
Monocytes Absolute: 0.6 K/uL (ref 0.1–1.0)
Monocytes Relative: 12 %
Neutro Abs: 3.5 K/uL (ref 1.7–7.7)
Neutrophils Relative %: 66 %
Platelet Count: 210 K/uL (ref 150–400)
RBC: 3.87 MIL/uL — ABNORMAL LOW (ref 4.22–5.81)
RDW: 12.4 % (ref 11.5–15.5)
WBC Count: 5.2 K/uL (ref 4.0–10.5)
nRBC: 0 % (ref 0.0–0.2)

## 2024-01-18 LAB — FERRITIN: Ferritin: 91 ng/mL (ref 24–336)

## 2024-01-18 LAB — CMP (CANCER CENTER ONLY)
ALT: 11 U/L (ref 0–44)
AST: 22 U/L (ref 15–41)
Albumin: 4.1 g/dL (ref 3.5–5.0)
Alkaline Phosphatase: 72 U/L (ref 38–126)
Anion gap: 7 (ref 5–15)
BUN: 9 mg/dL (ref 8–23)
CO2: 27 mmol/L (ref 22–32)
Calcium: 9.6 mg/dL (ref 8.9–10.3)
Chloride: 108 mmol/L (ref 98–111)
Creatinine: 0.83 mg/dL (ref 0.61–1.24)
GFR, Estimated: >60 mL/min (ref >60)
Glucose, Bld: 102 mg/dL — ABNORMAL HIGH (ref 70–99)
Potassium: 3.3 mmol/L — ABNORMAL LOW (ref 3.5–5.1)
Sodium: 142 mmol/L (ref 135–145)
Total Bilirubin: 0.6 mg/dL (ref 0.0–1.2)
Total Protein: 7.1 g/dL (ref 6.5–8.1)

## 2024-01-18 LAB — IRON AND IRON BINDING CAPACITY (CC-WL,HP ONLY)
Iron: 92 ug/dL (ref 45–182)
Saturation Ratios: 31 % (ref 17.9–39.5)
TIBC: 297 ug/dL (ref 250–450)
UIBC: 205 ug/dL (ref 117–376)

## 2024-01-18 LAB — SAMPLE TO BLOOD BANK

## 2024-01-18 NOTE — Progress Notes (Signed)
 HEMATOLOGY ONCOLOGY PROGRESS NOTE  Date of service: 01/18/2024  Patient Care Team: Jarold Medici, MD as PCP - General (Internal Medicine) Court Dorn PARAS, MD as PCP - Cardiology (Cardiology) Court Dorn PARAS, MD as Consulting Physician (Cardiology) Vertell Pont, RN as Oncology Nurse Navigator Endocrinologist: Dr. Littie Caffey   CHIEF COMPLAINT/PURPOSE OF CONSULTATION: Follow-up for continued evaluation and management of Iron  Deficiency Anemia and ??Prostate Cancer  DIAGNOSIS Iron  deficiency Anemia  HISTORY OF PRESENTING ILLNESS: Plz see previous notes for details on initial presentation   SUMMARY OF ONCOLOGIC HISTORY: Oncology History  Malignant neoplasm of prostate (HCC)  04/30/2023 Cancer Staging   Staging form: Prostate, AJCC 8th Edition - Clinical stage from 04/30/2023: Stage IIC (cT1c, cN0, cM0, PSA: 10.6, Grade Group: 4) - Signed by Sherwood Rise, PA-C on 06/30/2023 Histopathologic type: Adenocarcinoma, NOS Stage prefix: Initial diagnosis Prostate specific antigen (PSA) range: 10 to 19 Gleason primary pattern: 4 Gleason secondary pattern: 4 Gleason score: 8 Histologic grading system: 5 grade system Number of biopsy cores examined: 14 Number of biopsy cores positive: 5 Location of positive needle core biopsies: One side   06/30/2023 Initial Diagnosis   Malignant neoplasm of prostate (HCC)    Current treatment -p.o. iron  + IV Infusions as needed  INTERVAL HISTORY:  Shawn Fernandez is a 78 y.o. male who is here today for continued evaluation and management of Iron  Deficiency Anemia.   he was last seen by me on 05/29/2023; at the time he mentioned a recent diagnosis of Prostate Cancer.   Today, he states that his thyroid  medication has been adjusted to 112 micrograms from the prior 125 micrograms. He is still taking his Levothyroxine  pill once a day.  He finished his radiation treatment last month, with two episodes of diarrhea after finishing treatment.  Discontinued taking Palvix.  He has expressed his interest in wanting to return to work.  Denies any unexpected weight change, blood in stools, new lumps/bumps, breathing issues, or changes in appetite.    REVIEW OF SYSTEMS:   10 Point review of systems of done and is negative except as noted above.  MEDICAL HISTORY Past Medical History:  Diagnosis Date   Blood clot in abdominal vein 1999   [facility] at [facility]    Carotid artery disease    Dyslipidemia    FHx: heart disease    GERD (gastroesophageal reflux disease)    History of blood transfusion 8-9 yrs ago   HTN (hypertension)    Peripheral vascular disease   Hyperthyroidism -being followed by Dr. Caffey - was previously on methimazole for 6-8 months and has been off for a few weeks now. He was treated with radioactive iodine about one week ago.  He had presented with progressive weight loss from 2 years prior to diagnosis.   Abdominal aortic aneurysm status post endovascular stent graft- on aspirin , Plavix and cilostazol.   Previous history of iron  deficiency anemia ferritin was 9 about 5 years ago in 2011  SURGICAL HISTORY Past Surgical History:  Procedure Laterality Date   ANKLE SURGERY Right yrs ago   arm surgery Left yrs ago   2 rods inserted, 1 rod later removed   COLONOSCOPY WITH PROPOFOL  N/A 09/16/2013   Procedure: COLONOSCOPY WITH PROPOFOL ;  Surgeon: Belvie JONETTA Just, MD;  Location: [facility] ENDOSCOPY;  Service: Endoscopy;  Laterality: N/A;   GOLD SEED IMPLANT N/A 10/16/2023   Procedure: INSERTION, GOLD SEEDS;  Surgeon: Selma Donnice SAUNDERS, MD;  Location: [facility] OR;  Service: Urology;  Laterality: N/A;  GOLD SEED IMPLANTS AND SPACE OAR   LOWER EXTREMITY ANGIOGRAM N/A 07/24/2011   Procedure: LOWER EXTREMITY ANGIOGRAM;  Surgeon: Dorn JINNY Lesches, MD;  Location: [facility] CATH LAB;  Service: Cardiovascular;  Laterality: N/A;   SPACE OAR INSTILLATION N/A 10/16/2023   Procedure: INJECTION, HYDROGEL SPACER;  Surgeon: Selma Donnice SAUNDERS,  MD;  Location: [facility] OR;  Service: Urology;  Laterality: N/A;   stent in abdominal vein  4-5 yrs ago   US  ECHOCARDIOGRAPHY  07-20-2007   EF 55-60%    SOCIAL HISTORY Social History   Tobacco Use   Smoking status: Former    Current packs/day: 0.00    Average packs/day: 0.8 packs/day for 47.0 years (35.2 ttl pk-yrs)    Types: Cigarettes    Start date: 07/07/1962    Quit date: 07/01/2009    Years since quitting: 14.5   Smokeless tobacco: Never   Tobacco comments:    0.75pack x 30 years, quit in 2011, resumed smoking in 2012, one pack lasts a week    Quit 2 weeks ago  Vaping Use   Vaping status: Never Used  Substance Use Topics   Alcohol use: Not Currently    Comment: only occ   Drug use: No    Social History   Social History Narrative   Not on file    SOCIAL DRIVERS OF HEALTH SDOH Screenings   Food Insecurity: No Food Insecurity (12/09/2023)  Housing: Unknown (12/09/2023)  Transportation Needs: No Transportation Needs (12/09/2023)  Utilities: Not At Risk (12/09/2023)  Alcohol Screen: Low Risk  (12/09/2023)  Depression (PHQ2-9): Low Risk  (12/14/2023)  Financial Resource Strain: Low Risk  (12/09/2023)  Physical Activity: Sufficiently Active (12/09/2023)  Social Connections: Moderately Isolated (12/09/2023)  Stress: No Stress Concern Present (12/09/2023)  Tobacco Use: Medium Risk (12/14/2023)  Health Literacy: Adequate Health Literacy (12/09/2023)     FAMILY HISTORY Family History  Problem Relation Age of Onset   Heart disease Brother    Heart disease Mother    Cancer Father        ? type     ALLERGIES: is allergic to lisinopril.  MEDICATIONS  Current Outpatient Medications  Medication Sig Dispense Refill   albuterol  (VENTOLIN  HFA) 108 (90 Base) MCG/ACT inhaler TAKE 2 PUFFS BY MOUTH EVERY 6 HOURS AS NEEDED FOR WHEEZE OR SHORTNESS OF BREATH 8.5 each 1   amLODipine  (NORVASC ) 10 MG tablet TAKE 1 TABLET BY MOUTH EVERYDAY AT BEDTIME 90 tablet 2   aspirin  EC 81 MG tablet Take  81 mg by mouth daily.     atorvastatin  (LIPITOR) 10 MG tablet TAKE ONE TAB BY MOUTH DAILY, EXCEPT SUNDAYS START 6/10 90 tablet 3   Cholecalciferol (VITAMIN D3) 1000 units CAPS Take 1 capsule by mouth daily.     fluticasone  (FLONASE ) 50 MCG/ACT nasal spray Place 1 spray into both nostrils daily for 3 days. 16 g 0   guaiFENesin -codeine  100-10 MG/5ML syrup Take 5 mLs by mouth every 6 (six) hours as needed for cough. 80 mL 0   hydrALAZINE  (APRESOLINE ) 25 MG tablet TAKE 1 TABLET (25 MG TOTAL) BY MOUTH 3 (THREE) TIMES DAILY. ONE TABLET BY MOUTH AT BREAKFAST LUNCH AND BEDTIME. 270 tablet 2   iron  polysaccharides (NIFEREX) 150 MG capsule Take 1 capsule (150 mg total) by mouth daily. 90 capsule 2   levothyroxine  (SYNTHROID ) 112 MCG tablet TAKE 1 TABLET BY MOUTH EVERY DAY 90 tablet 1   Multiple Vitamin (MULTIVITAMIN WITH MINERALS) TABS tablet Take 1 tablet by mouth daily.  pantoprazole  (PROTONIX ) 40 MG tablet TAKE 1 TABLET BY MOUTH EVERY DAY 90 tablet 2   sertraline  (ZOLOFT ) 25 MG tablet TAKE 1 TABLET (25 MG TOTAL) BY MOUTH DAILY. 90 tablet 2   sildenafil (VIAGRA) 100 MG tablet Take 1 tablet by mouth See admin instructions.     valsartan  (DIOVAN ) 160 MG tablet TAKE 1 TABLET BY MOUTH EVERY DAY 90 tablet 1   No current facility-administered medications for this visit.    PHYSICAL EXAMINATION: ECOG PERFORMANCE STATUS: 1 - Symptomatic but completely ambulatory VITALS: Vitals:   01/18/24 1105  BP: 134/61  Pulse: 68  Temp: 97.9 F (36.6 C)  SpO2: 99%   Filed Weights   01/18/24 1105  Weight: 141 lb 4.8 oz (64.1 kg)   Body mass index is 20.87 kg/m.  GENERAL: alert, in no acute distress and comfortable SKIN: no acute rashes, no significant lesions EYES: conjunctiva are pink and non-injected, sclera anicteric OROPHARYNX: MMM, no exudates, no oropharyngeal erythema or ulceration NECK: supple, no JVD LYMPH:  no palpable lymphadenopathy in the cervical, axillary or inguinal regions LUNGS: clear  to auscultation b/l with normal respiratory effort HEART: regular rate & rhythm ABDOMEN:  normoactive bowel sounds , non tender, not distended, no hepatosplenomegaly Extremity: no pedal edema PSYCH: alert & oriented x 3 with fluent speech NEURO: no focal motor/sensory deficits  LABORATORY DATA:   I have reviewed the data as listed     Latest Ref Rng & Units 01/18/2024   10:25 AM 05/29/2023    9:26 AM 02/05/2023   12:34 PM  CBC EXTENDED  WBC 4.0 - 10.5 K/uL 5.2  3.9  4.5   RBC 4.22 - 5.81 MIL/uL 3.87  4.77  4.67   Hemoglobin 13.0 - 17.0 g/dL 88.5  86.5  86.8   HCT 39.0 - 52.0 % 35.3  42.6  42.1   Platelets 150 - 400 K/uL 210  170  207   NEUT# 1.7 - 7.7 K/uL 3.5  2.0    Lymph# 0.7 - 4.0 K/uL 0.8  1.3          Latest Ref Rng & Units 01/18/2024   10:25 AM 12/14/2023   12:30 PM 10/16/2023    8:30 AM  CMP  Glucose 70 - 99 mg/dL 897  87  897   BUN 8 - 23 mg/dL 9  9  15    Creatinine 0.61 - 1.24 mg/dL 9.16  9.09  9.02   Sodium 135 - 145 mmol/L 142  143  142   Potassium 3.5 - 5.1 mmol/L 3.3  4.4  3.7   Chloride 98 - 111 mmol/L 108  105  109   CO2 22 - 32 mmol/L 27  24  24    Calcium  8.9 - 10.3 mg/dL 9.6  9.8  9.2   Total Protein 6.5 - 8.1 g/dL 7.1  7.1    Total Bilirubin 0.0 - 1.2 mg/dL 0.6  0.6    Alkaline Phos 38 - 126 U/L 72  82    AST 15 - 41 U/L 22  23    ALT 0 - 44 U/L 11  11     . Lab Results  Component Value Date   IRON  92 01/18/2024   TIBC 297 01/18/2024   IRONPCTSAT 31 01/18/2024   (Iron  and TIBC)  Lab Results  Component Value Date   FERRITIN 38 01/18/2024     RADIOGRAPHIC STUDIES: I have personally reviewed the radiological images as listed and agreed with the findings in the report.  NM PET CT CARDIAC PERFUSION MULTI W/ABSOLUTE BLOODFLOW Result Date: 12/15/2023   LV perfusion is normal. There is no evidence of ischemia. There is no evidence of infarction.   Rest left ventricular function is normal. Rest EF: 51%. Stress left ventricular function is  normal. Stress EF: 63%. End diastolic cavity size is normal.   Myocardial blood flow was computed to be 1.84ml/g/min at rest and 2.51ml/g/min at stress. Global myocardial blood flow reserve was 2.18 and was normal.   Coronary calcium  was present on the attenuation correction CT images. Severe coronary calcifications were present. Coronary calcifications were present in the left anterior descending artery, left circumflex artery and right coronary artery distribution(s).   The study is normal. The study is low risk.   Electronically signed by Darryle Decent, MD ___________________________________________________________________________ __________________________ CLINICAL DATA:  This over-read does not include interpretation of cardiac or coronary anatomy or pathology. The Cardiac PET CT interpretation by the cardiologist is attached. COMPARISON:  10/15/2023 cardiac CT. FINDINGS: No pleural fluid.  Centrilobular emphysema. Possible subpleural bibasilar interstitial lung disease, as evidenced by interstitial thickening including on 33/4. Aortic atherosclerosis. No imaged thoracic adenopathy. 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 Normal imaged portions of the spleen, stomach, pancreas, adrenal glands, kidneys. No acute osseous abnormality. IMPRESSION: 1. No acute findings in the imaged extracardiac chest. 2. Possible interstitial lung disease. Correlate with chronic pulmonary symptoms. If clinical concern, consider high-resolution chest CT. 3. Aortic Atherosclerosis (ICD10-I70.0) and Emphysema (ICD10-J43.9). Electronically Signed   By: Rockey Kilts M.D.   On: 12/15/2023 12:13  VAS US  CAROTID Result Date: 11/12/2023 Carotid Arterial Duplex Study Patient Name:  Shawn Fernandez  Date of Exam:   11/11/2023 Medical Rec #: [id number]         Accession #:    [id number] Date of Birth: 04/08/1945          Patient Gender: M Patient Age:   39 years Exam Location:  [address] Procedure:      VAS US  CAROTID Referring Phys:  DORN BERRY --------------------------------------------------------------------------------  Indications:       PAD, mixed hypertension. Risk Factors:      Hypertension, hyperlipidemia, past history of smoking,                    coronary artery disease. Comparison Study:  12/01/22                    Right Carotid: Velocities in the right ICA are consistent                    with a 40-59%                    stenosis, lowest end of range.                     Left Carotid: Velocities in the left ICA are consistent with                    a 40-59%                    stenosis,                    lowest end of range.                     Vertebrals: Right vertebral artery demonstrates antegrade  flow. Left                    vertebral artery demonstrates retrograde flow.                    Subclavians: Normal flow hemodynamics were seen in bilateral                    subclavian arteries. Performing Technologist: Dena Pane  Examination Guidelines: A complete evaluation includes B-mode imaging, spectral Doppler, color Doppler, and power Doppler as needed of all accessible portions of each vessel. Bilateral testing is considered an integral part of a complete examination. Limited examinations for reoccurring indications may be performed as noted.  Right Carotid Findings: +----------+-------+--------+--------+-----------------------+-----------------+           PSV    EDV cm/sStenosisPlaque Description     Comments                    cm/s                                                            +----------+-------+--------+--------+-----------------------+-----------------+ CCA Prox  93     9                                      intimal                                                                   thickening        +----------+-------+--------+--------+-----------------------+-----------------+ CCA Mid   84     19      <50%    heterogenous                              +----------+-------+--------+--------+-----------------------+-----------------+ CCA Distal74     17                                     intimal                                                                   thickening        +----------+-------+--------+--------+-----------------------+-----------------+ ICA Prox  120    30      40-59%  heterogenous and                                                          calcific                                 +----------+-------+--------+--------+-----------------------+-----------------+  ICA Mid   27     8                                                        +----------+-------+--------+--------+-----------------------+-----------------+ ICA Distal52     19                                                       +----------+-------+--------+--------+-----------------------+-----------------+ ECA       57     6                                                        +----------+-------+--------+--------+-----------------------+-----------------+ +----------+--------+-------+--------+-------------------+           PSV cm/sEDV cmsDescribeArm Pressure (mmHG) +----------+--------+-------+--------+-------------------+ Subclavian183     0              143                 +----------+--------+-------+--------+-------------------+ +---------+--------+---+--------+--+---------+ VertebralPSV cm/s100EDV cm/s26Antegrade +---------+--------+---+--------+--+---------+ Based on PSV and plaque the ICA is classified as 40-59% stenosis. Left Carotid Findings: +----------+--------+--------+--------+------------------+------------------+           PSV cm/sEDV cm/sStenosisPlaque DescriptionComments           +----------+--------+--------+--------+------------------+------------------+ CCA Prox  71      15                                intimal thickening  +----------+--------+--------+--------+------------------+------------------+ CCA Distal90      19                                intimal thickening +----------+--------+--------+--------+------------------+------------------+ ICA Prox  202     57      40-59%  heterogenous                         +----------+--------+--------+--------+------------------+------------------+ ICA Mid   41      14                                                   +----------+--------+--------+--------+------------------+------------------+ ICA Distal56      21                                                   +----------+--------+--------+--------+------------------+------------------+ ECA       104     70              heterogenous                         +----------+--------+--------+--------+------------------+------------------+ +----------+--------+--------+--------+-------------------+  PSV cm/sEDV cm/sDescribeArm Pressure (mmHG) +----------+--------+--------+--------+-------------------+ [id number]      1                                   +----------+--------+--------+--------+-------------------+ Patient has rod in left arm, unable to take BP in left arm. Left vertebral artery is not well visualized, possibly occluded.  Summary: Right Carotid: Velocities in the right ICA are consistent with a 40-59%                stenosis. Non-hemodynamically significant plaque <50% noted in                the CCA. Ther is moderate diffuse heterogeneous plaque throught                out the carotid arteries. The right ICA stenosis is based on                plaque volume. Velocity suggests mild stenosis. Left Carotid: Velocities in the left ICA are consistent with a 60-79% stenosis.               The ECA appears <50% stenosed. Ther is moderate diffuse               heterogeneous plaque throught out the carotid arteries. Stenosis               lower end of the spectrum. Vertebrals:  Right  vertebral artery demonstrates antegrade flow. Left vertebral              artery demonstrates no discernable flow. Subclavians: Normal flow hemodynamics were seen in bilateral subclavian              arteries. *See table(s) above for measurements and observations. Suggest follow up study in 12 months. Compared to the study done on 12/01/2022, there is mild progression of the stenosis severity in the left ICA. Electronically signed by Gordy Bergamo MD on 11/12/2023 at 8:38:07 PM.    Final      ASSESSMENT & PLAN:  78 y.o. male with  1) Microcytic hypochromic Anemia due to Iron  deficiency - now resolved.   Previously had Ferritin 27, Iron  sat too low to calculate. No overt evidence of bleeding. Patient notes recent fecal occult blood neg x 1. He has had significant iron  deficiency anemia in the past in 2011 with a ferritin level of 9 and required a blood transfusion. EGD showed duodenitis and hiatal hernia. Patient was previously on ASA+ plavix for his endovascular stent graft for repaired AAA and is on Cilostazole for PAD.  He is currently only on aspirin . Some element of anemia also appears to be temporally related to methimazole use (though agranulocytosis is the usual concern and isolated anemia is less usual) The hyperthyroidism itself can also contribute to the anemia until corrected. SPEP with no M spike No evidence of hemolysis B12/folate WNL  PLAN: - Discussed lab results on 01/18/2024 in detail with patient: CBC showed WBC of 3.9 K decreased from 4.5 K, Hemoglobin of 13.4 decreased from 13.1, and PLTs of 170 K decreased from 207 K CMP stable. Ferritin 91 with Iron  saturation of 31% Continue po iron  polysaccharide 150mg  po daily No indication for IV Iron  replacement at this time.  2) Hx of Prostate cancer  Continue to follow urology and radiation oncology.  FOLLOW-UP  RTC with Dr Onesimo with labs in 12 months  The total time  spent in the appointment was 20 minutes* .  All of the  patient's questions were answered and the patient knows to call the clinic with any problems, questions, or concerns.  Emaline Saran MD MS AAHIVMS [facility] Hematology/Oncology Physician [facility]  *Total Encounter Time as defined by the Centers for Medicare and Medicaid Services includes, in addition to the face-to-face time of a patient visit (documented in the note above) non-face-to-face time: obtaining and reviewing outside history, ordering and reviewing medications, tests or procedures, care coordination (communications with other health care professionals or caregivers) and documentation in the medical record.  I,Emily Lagle,acting as a neurosurgeon for Emaline Saran, MD.,have documented all relevant documentation on the behalf of Emaline Saran, MD,as directed by  Emaline Saran, MD while in the presence of Emaline Saran, MD.  I have reviewed the above documentation for accuracy and completeness, and I agree with the above.  Tajia Szeliga, MD

## 2024-01-22 NOTE — Progress Notes (Signed)
  Radiation Oncology         [phone] ________________________________  Name: Shawn Fernandez MRN: [id number]  Date of Service: 02/02/2024  DOB: [birth year]  Post Treatment Telephone Note  Diagnosis:  78 y.o. gentleman with Stage T1c adenocarcinoma of the prostate with Gleason score of 4+4, and PSA of 10.6.     The patient was not available for call today.

## 2024-01-25 ENCOUNTER — Encounter: Payer: Self-pay | Admitting: Hematology

## 2024-02-02 ENCOUNTER — Ambulatory Visit
Admission: RE | Admit: 2024-02-02 | Discharge: 2024-02-02 | Disposition: A | Source: Ambulatory Visit | Attending: Radiation Oncology

## 2024-02-02 DIAGNOSIS — C61 Malignant neoplasm of prostate: Secondary | ICD-10-CM

## 2024-02-10 ENCOUNTER — Encounter: Payer: Self-pay | Admitting: Internal Medicine

## 2024-02-10 NOTE — Patient Instructions (Incomplete)
 Health Maintenance, Male  Adopting a healthy lifestyle and getting preventive care are important in promoting health and wellness. Ask your health care provider about:  The right schedule for you to have regular tests and exams.  Things you can do on your own to prevent diseases and keep yourself healthy.  What should I know about diet, weight, and exercise?  Eat a healthy diet    Eat a diet that includes plenty of vegetables, fruits, low-fat dairy products, and lean protein.  Do not eat a lot of foods that are high in solid fats, added sugars, or sodium.  Maintain a healthy weight  Body mass index (BMI) is a measurement that can be used to identify possible weight problems. It estimates body fat based on height and weight. Your health care provider can help determine your BMI and help you achieve or maintain a healthy weight.  Get regular exercise  Get regular exercise. This is one of the most important things you can do for your health. Most adults should:  Exercise for at least 150 minutes each week. The exercise should increase your heart rate and make you sweat (moderate-intensity exercise).  Do strengthening exercises at least twice a week. This is in addition to the moderate-intensity exercise.  Spend less time sitting. Even light physical activity can be beneficial.  Watch cholesterol and blood lipids  Have your blood tested for lipids and cholesterol at 78 years of age, then have this test every 5 years.  You may need to have your cholesterol levels checked more often if:  Your lipid or cholesterol levels are high.  You are older than 78 years of age.  You are at high risk for heart disease.  What should I know about cancer screening?  Many types of cancers can be detected early and may often be prevented. Depending on your health history and family history, you may need to have cancer screening at various ages. This may include screening for:  Colorectal cancer.  Prostate cancer.  Skin cancer.  Lung  cancer.  What should I know about heart disease, diabetes, and high blood pressure?  Blood pressure and heart disease  High blood pressure causes heart disease and increases the risk of stroke. This is more likely to develop in people who have high blood pressure readings or are overweight.  Talk with your health care provider about your target blood pressure readings.  Have your blood pressure checked:  Every 3-5 years if you are 24-52 years of age.  Every year if you are 3 years old or older.  If you are between the ages of 60 and 72 and are a current or former smoker, ask your health care provider if you should have a one-time screening for abdominal aortic aneurysm (AAA).  Diabetes  Have regular diabetes screenings. This checks your fasting blood sugar level. Have the screening done:  Once every three years after age 66 if you are at a normal weight and have a low risk for diabetes.  More often and at a younger age if you are overweight or have a high risk for diabetes.  What should I know about preventing infection?  Hepatitis B  If you have a higher risk for hepatitis B, you should be screened for this virus. Talk with your health care provider to find out if you are at risk for hepatitis B infection.  Hepatitis C  Blood testing is recommended for:  Everyone born from [birth year] through 1965.  Anyone  with known risk factors for hepatitis C.  Sexually transmitted infections (STIs)  You should be screened each year for STIs, including gonorrhea and chlamydia, if:  You are sexually active and are younger than 78 years of age.  You are older than 78 years of age and your health care provider tells you that you are at risk for this type of infection.  Your sexual activity has changed since you were last screened, and you are at increased risk for chlamydia or gonorrhea. Ask your health care provider if you are at risk.  Ask your health care provider about whether you are at high risk for HIV. Your health care provider  may recommend a prescription medicine to help prevent HIV infection. If you choose to take medicine to prevent HIV, you should first get tested for HIV. You should then be tested every 3 months for as long as you are taking the medicine.  Follow these instructions at home:  Alcohol use  Do not drink alcohol if your health care provider tells you not to drink.  If you drink alcohol:  Limit how much you have to 0-2 drinks a day.  Know how much alcohol is in your drink. In the [place] one drink equals one 12 oz bottle of beer (355 mL), one 5 oz glass of wine (148 mL), or one 1 oz glass of hard liquor (44 mL).  Lifestyle  Do not use any products that contain nicotine or tobacco. These products include cigarettes, chewing tobacco, and vaping devices, such as e-cigarettes. If you need help quitting, ask your health care provider.  Do not use street drugs.  Do not share needles.  Ask your health care provider for help if you need support or information about quitting drugs.  General instructions  Schedule regular health, dental, and eye exams.  Stay current with your vaccines.  Tell your health care provider if:  You often feel depressed.  You have ever been abused or do not feel safe at home.  Summary  Adopting a healthy lifestyle and getting preventive care are important in promoting health and wellness.  Follow your health care provider's instructions about healthy diet, exercising, and getting tested or screened for diseases.  Follow your health care provider's instructions on monitoring your cholesterol and blood pressure.  This information is not intended to replace advice given to you by your health care provider. Make sure you discuss any questions you have with your health care provider.  Document Revised: 07/09/2020 Document Reviewed: 07/09/2020  Elsevier Patient Education  2024 [employer].

## 2024-02-10 NOTE — Progress Notes (Deleted)
 I,Alvey Brockel T Emmitt, CMA,acting as a neurosurgeon for Catheryn LOISE Slocumb, MD.,have documented all relevant documentation on the behalf of Catheryn LOISE Slocumb, MD,as directed by  Catheryn LOISE Slocumb, MD while in the presence of Catheryn LOISE Slocumb, MD.  Subjective:   Patient ID: Shawn Fernandez , male    DOB: 1945/10/20 , 78 y.o.   MRN: [id number]  No chief complaint on file.   HPI  He is here today for a full physical exam. He has no specific concerns or complaints at this time. He reports compliance with meds.  He denies having any headaches, chest pain and shortness of breath. He is accompanied by his wife today.  She has a concern about unintentional weight loss.  He states he has a good appetite. She disagrees. She states he stays in the bed for long amounts of time. She has to persuade him to get up. He does admit that his home situation is stressful because he lives with his SIL who tends to cause discord.      Hypertension This is a chronic problem. The current episode started more than 1 year ago. The problem has been gradually improving since onset. The problem is controlled. Pertinent negatives include no blurred vision, chest pain, palpitations or shortness of breath. Risk factors for coronary artery disease include smoking/tobacco exposure, sedentary lifestyle and male gender. The current treatment provides moderate improvement. Compliance problems include exercise.      Past Medical History:  Diagnosis Date   Blood clot in abdominal vein 1999   [facility] at [facility]    Carotid artery disease    Dyslipidemia    FHx: heart disease    GERD (gastroesophageal reflux disease)    History of blood transfusion 8-9 yrs ago   HTN (hypertension)    Peripheral vascular disease      Family History  Problem Relation Age of Onset   Heart disease Brother    Heart disease Mother    Cancer Father        ? type     Current Outpatient Medications:    albuterol  (VENTOLIN  HFA)  108 (90 Base) MCG/ACT inhaler, TAKE 2 PUFFS BY MOUTH EVERY 6 HOURS AS NEEDED FOR WHEEZE OR SHORTNESS OF BREATH, Disp: 8.5 each, Rfl: 1   amLODipine  (NORVASC ) 10 MG tablet, TAKE 1 TABLET BY MOUTH EVERYDAY AT BEDTIME, Disp: 90 tablet, Rfl: 2   aspirin  EC 81 MG tablet, Take 81 mg by mouth daily., Disp: , Rfl:    atorvastatin  (LIPITOR) 10 MG tablet, TAKE ONE TAB BY MOUTH DAILY, EXCEPT SUNDAYS START 6/10, Disp: 90 tablet, Rfl: 3   Cholecalciferol (VITAMIN D3) 1000 units CAPS, Take 1 capsule by mouth daily., Disp: , Rfl:    fluticasone  (FLONASE ) 50 MCG/ACT nasal spray, Place 1 spray into both nostrils daily for 3 days., Disp: 16 g, Rfl: 0   guaiFENesin -codeine  100-10 MG/5ML syrup, Take 5 mLs by mouth every 6 (six) hours as needed for cough., Disp: 80 mL, Rfl: 0   hydrALAZINE  (APRESOLINE ) 25 MG tablet, TAKE 1 TABLET (25 MG TOTAL) BY MOUTH 3 (THREE) TIMES DAILY. ONE TABLET BY MOUTH AT BREAKFAST LUNCH AND BEDTIME., Disp: 270 tablet, Rfl: 2   iron  polysaccharides (NIFEREX) 150 MG capsule, Take 1 capsule (150 mg total) by mouth daily., Disp: 90 capsule, Rfl: 2   levothyroxine  (SYNTHROID ) 112 MCG tablet, TAKE 1 TABLET BY MOUTH EVERY DAY, Disp: 90 tablet, Rfl: 1   Multiple Vitamin (MULTIVITAMIN WITH MINERALS) TABS tablet,  Take 1 tablet by mouth daily., Disp: , Rfl:    pantoprazole  (PROTONIX ) 40 MG tablet, TAKE 1 TABLET BY MOUTH EVERY DAY, Disp: 90 tablet, Rfl: 2   sertraline  (ZOLOFT ) 25 MG tablet, TAKE 1 TABLET (25 MG TOTAL) BY MOUTH DAILY., Disp: 90 tablet, Rfl: 2   sildenafil (VIAGRA) 100 MG tablet, Take 1 tablet by mouth See admin instructions., Disp: , Rfl:    valsartan  (DIOVAN ) 160 MG tablet, TAKE 1 TABLET BY MOUTH EVERY DAY, Disp: 90 tablet, Rfl: 1   Allergies  Allergen Reactions   Lisinopril Cough     Men's preventive visit. Patient Health Questionnaire (PHQ-2) is  Flowsheet Row Office Visit from 12/14/2023 in [facility]  PHQ-2 Total Score 0  .  Patient is on a *** diet. Marital status: Married. Relevant history for alcohol use is:  Social History   Substance and Sexual Activity  Alcohol Use Not Currently   Comment: only occ  . Relevant history for tobacco use is:  Social History   Tobacco Use  Smoking Status Former   Current packs/day: 0.00   Average packs/day: 0.8 packs/day for 47.0 years (35.2 ttl pk-yrs)   Types: Cigarettes   Start date: 07/07/1962   Quit date: 07/01/2009   Years since quitting: 14.6  Smokeless Tobacco Never  Tobacco Comments   0.75pack x 30 years, quit in 2011, resumed smoking in 2012, one pack lasts a week   Quit 2 weeks ago  .   Review of Systems  Constitutional: Negative.   Eyes:  Negative for blurred vision.  Respiratory: Negative.  Negative for shortness of breath.   Cardiovascular:  Negative for chest pain and palpitations.  Gastrointestinal: Negative.   Skin: Negative.   Allergic/Immunologic: Negative.   Hematological: Negative.      There were no vitals filed for this visit. There is no height or weight on file to calculate BMI.  Wt Readings from Last 3 Encounters:  01/18/24 141 lb 4.8 oz (64.1 kg)  12/14/23 146 lb 3.2 oz (66.3 kg)  12/09/23 146 lb 3.2 oz (66.3 kg)    Objective:  Physical Exam      Assessment And Plan:    There are no diagnoses linked to this encounter.   No follow-ups on file. Patient was given opportunity to ask questions. Patient verbalized understanding of the plan and was able to repeat key elements of the plan. All questions were answered to their satisfaction.   Catheryn LOISE Slocumb, MD  I, Catheryn LOISE Slocumb, MD, have reviewed all documentation for this visit. The documentation on 02/10/24 for the exam, diagnosis, procedures, and orders are all accurate and complete.

## 2024-02-19 ENCOUNTER — Other Ambulatory Visit: Payer: Self-pay | Admitting: Urology

## 2024-02-19 DIAGNOSIS — C61 Malignant neoplasm of prostate: Secondary | ICD-10-CM

## 2024-03-08 ENCOUNTER — Ambulatory Visit: Admitting: "Endocrinology

## 2024-04-01 ENCOUNTER — Encounter: Payer: Self-pay | Admitting: *Deleted

## 2024-04-04 ENCOUNTER — Ambulatory Visit: Payer: Self-pay

## 2024-04-04 ENCOUNTER — Encounter: Payer: Self-pay | Admitting: Nurse Practitioner

## 2024-04-04 ENCOUNTER — Telehealth: Admitting: Nurse Practitioner

## 2024-04-04 DIAGNOSIS — J069 Acute upper respiratory infection, unspecified: Secondary | ICD-10-CM

## 2024-04-04 MED ORDER — GUAIFENESIN-CODEINE 100-10 MG/5ML PO SOLN: 5.0000 mL | Freq: Four times a day (QID) | ORAL | 0 refills | Status: AC | PRN

## 2024-04-04 MED ORDER — AZITHROMYCIN 250 MG PO TABS: ORAL_TABLET | ORAL | 0 refills | Status: AC

## 2024-04-04 NOTE — Telephone Encounter (Signed)
 FYI Only or Action Required?: FYI only for provider: appointment scheduled on 04/04/24.  Patient was last seen in primary care on 12/14/2023 by Jarold Medici, MD.  Called Nurse Triage reporting Wheezing and Cough.  Symptoms began several days ago.  Interventions attempted: Prescription medications: Albuterol  inhaler once daily.  Symptoms are: gradually worsening.  Triage Disposition: See HCP Within 4 Hours (Or PCP Triage)  Patient/caregiver understands and will follow disposition?: Yes                      Message from [facility] sent at 04/04/2024  9:57 AM EST  Reason for Triage: Wheezing, colored mucus, deep cough   Reason for Disposition  Wheezing is present  Answer Assessment - Initial Assessment Questions Advised patient to be seen today, urgent care. Daughter refused due to weather and can't leave the house. Offered virtual visit, no access to [employer]. Daughter is with patient and would like the provider to call her phone instead and states she has done that before with [facility] provider.  1. ONSET: When did the cough begin?      X couple days.  2. SEVERITY: How bad is the cough today?      Coughing a lot. Not able to sleep due to cough, worse at nighttime.  3. SPUTUM: Describe the color of your sputum (e.g., none, dry cough; clear, white, yellow, green)     Sometimes white, yellow or green.  4. HEMOPTYSIS: Are you coughing up any blood? If Yes, ask: How much? (e.g., flecks, streaks, tablespoons, etc.)     No.  5. DIFFICULTY BREATHING: Are you having difficulty breathing? If Yes, ask: How bad is it? (e.g., mild, moderate, severe)      No SOB or difficulty breathing. Sometimes wheezing but resolves/improves  6. FEVER: Do you have a fever? If Yes, ask: What is your temperature, how was it measured, and when did it start?     No.  7. CARDIAC HISTORY: Do you have any history of heart disease? (e.g., heart attack, congestive heart  failure)      PAD, CAD, HTN.  8. LUNG HISTORY: Do you have any history of lung disease?  (e.g., pulmonary embolus, asthma, emphysema)     Emphysema.  9. PE RISK FACTORS: Do you have a history of blood clots? (or: recent major surgery, recent prolonged travel, bedridden)     No.  10. OTHER SYMPTOMS: Do you have any other symptoms? (e.g., runny nose, wheezing, chest pain)       No fever, SOB, chest pain, hemoptysis.  Protocols used: Cough - Acute Productive-A-AH

## 2024-04-04 NOTE — Progress Notes (Signed)
 "  Virtual Visit via Video Note  LILLETTE Kristeen JINNY Gladis, CMA,acting as a scribe for Gaines Ada, FNP.,have documented all relevant documentation on the behalf of Gaines Ada, FNP,as directed by  Gaines Ada, FNP while in the presence of Gaines Ada, FNP.  I connected with Salomon MALVA North on 04/04/24 at 12:00 PM EST by a video enabled telemedicine application and verified that I am speaking with the correct person using two identifiers.  Patient Location: Home Provider Location: Home Office  I discussed the limitations, risks, security, and privacy concerns of performing an evaluation and management service by video and the availability of in person appointments. I also discussed with the patient that there may be a patient responsible charge related to this service. The patient expressed understanding and agreed to proceed.  Subjective: PCP: Jarold Medici, MD  Chief Complaint  Patient presents with   Cough    Patient presents today for a cough with yellow and green mucus. Patient reports some wheezing but is using his inhaler. Patient reports it first started a week ago. Patient has tried OTC medications but no relief.    HPI Discussed the use of AI scribe software for clinical note transcription with the patient, who gave verbal consent to proceed.  History of Present Illness Shawn Fernandez is a 79 year old male who presents with a cough and mucus production. He is accompanied by Comer Mane his daughter, who is his emergency contact.  He has experienced a cough for about a week, accompanied by yellow and green mucus production. The cough is more severe at night, disrupting his sleep. He notes a runny nose and a sensation of postnasal drip when sitting and leaning his head back. No fever or shortness of breath.  He has been using Coricidin and Nyquil to manage his symptoms. He also uses an Albuterol  inhaler, which has previously helped with wheezing, although he is not currently  experiencing wheezing. He does not have any remaining cough syrup from a previous prescription in June.  He is allergic to lisinopril, and no one else in his household is currently sick.   ROS: Per HPI Current Medications[1]  Observations/Objective: There were no vitals filed for this visit. Physical Exam Vitals and nursing note reviewed.  Constitutional:      General: He is not in acute distress.    Appearance: Normal appearance. He is normal weight.  Pulmonary:     Effort: Pulmonary effort is normal. No respiratory distress.  Neurological:     General: No focal deficit present.     Mental Status: He is alert and oriented to person, place, and time.     Cranial Nerves: No cranial nerve deficit.     Motor: No weakness.  Psychiatric:        Mood and Affect: Mood normal.        Behavior: Behavior normal.        Thought Content: Thought content normal.        Judgment: Judgment normal.     Assessment and Plan: Upper respiratory tract infection, unspecified type Cough with yellow and green mucus, worse at night. No fever or wheezing. Treated with antibiotics due to suspected bacterial infection. - Prescribed azithromycin : two tablets on day one, then one tablet daily for four days. - Refilled cough syrup for nighttime cough. - Scheduled follow-up call tomorrow to assess progress and consider chest x-ray. -     Azithromycin ; Take 2 tablets (500 mg) on  Day 1,  followed  by 1 tablet (250 mg) once daily on Days 2 through 5.  Dispense: 6 each; Refill: 0 -     guaiFENesin -Codeine ; Take 5 mLs by mouth every 6 (six) hours as needed for cough.  Dispense: 80 mL; Refill: 0    Follow Up Instructions: Return in about 2 months (around 06/02/2024) for f/u w/ rs bp .    I discussed the assessment and treatment plan with the patient. The patient was provided an opportunity to ask questions, and all were answered. The patient agreed with the plan and demonstrated an understanding of the  instructions.   The patient was advised to call back or seek an in-person evaluation if the symptoms worsen or if the condition fails to improve as anticipated.  The above assessment and management plan was discussed with the patient. The patient verbalized understanding of and has agreed to the management plan.   LILLETTE Gaines Ada, FNP, have reviewed all documentation for this visit. The documentation on 04/04/24 for the exam, diagnosis, procedures, and orders are all accurate and complete.      [1]  Current Outpatient Medications:    albuterol  (VENTOLIN  HFA) 108 (90 Base) MCG/ACT inhaler, TAKE 2 PUFFS BY MOUTH EVERY 6 HOURS AS NEEDED FOR WHEEZE OR SHORTNESS OF BREATH, Disp: 8.5 each, Rfl: 1   amLODipine  (NORVASC ) 10 MG tablet, TAKE 1 TABLET BY MOUTH EVERYDAY AT BEDTIME, Disp: 90 tablet, Rfl: 2   aspirin  EC 81 MG tablet, Take 81 mg by mouth daily., Disp: , Rfl:    atorvastatin  (LIPITOR) 10 MG tablet, TAKE ONE TAB BY MOUTH DAILY, EXCEPT SUNDAYS START 6/10, Disp: 90 tablet, Rfl: 3   azithromycin  (ZITHROMAX ) 250 MG tablet, Take 2 tablets (500 mg) on  Day 1,  followed by 1 tablet (250 mg) once daily on Days 2 through 5., Disp: 6 each, Rfl: 0   Cholecalciferol (VITAMIN D3) 1000 units CAPS, Take 1 capsule by mouth daily., Disp: , Rfl:    fluticasone  (FLONASE ) 50 MCG/ACT nasal spray, Place 1 spray into both nostrils daily for 3 days., Disp: 16 g, Rfl: 0   hydrALAZINE  (APRESOLINE ) 25 MG tablet, TAKE 1 TABLET (25 MG TOTAL) BY MOUTH 3 (THREE) TIMES DAILY. ONE TABLET BY MOUTH AT BREAKFAST LUNCH AND BEDTIME., Disp: 270 tablet, Rfl: 2   iron  polysaccharides (NIFEREX) 150 MG capsule, Take 1 capsule (150 mg total) by mouth daily., Disp: 90 capsule, Rfl: 2   levothyroxine  (SYNTHROID ) 112 MCG tablet, TAKE 1 TABLET BY MOUTH EVERY DAY, Disp: 90 tablet, Rfl: 1   Multiple Vitamin (MULTIVITAMIN WITH MINERALS) TABS tablet, Take 1 tablet by mouth daily., Disp: , Rfl:    pantoprazole  (PROTONIX ) 40 MG tablet, TAKE 1  TABLET BY MOUTH EVERY DAY, Disp: 90 tablet, Rfl: 2   sertraline  (ZOLOFT ) 25 MG tablet, TAKE 1 TABLET (25 MG TOTAL) BY MOUTH DAILY., Disp: 90 tablet, Rfl: 2   sildenafil (VIAGRA) 100 MG tablet, Take 1 tablet by mouth See admin instructions., Disp: , Rfl:    valsartan  (DIOVAN ) 160 MG tablet, TAKE 1 TABLET BY MOUTH EVERY DAY, Disp: 90 tablet, Rfl: 1   guaiFENesin -codeine  100-10 MG/5ML syrup, Take 5 mLs by mouth every 6 (six) hours as needed for cough., Disp: 80 mL, Rfl: 0  "

## 2024-05-23 ENCOUNTER — Inpatient Hospital Stay: Attending: Hematology | Admitting: *Deleted

## 2024-07-28 ENCOUNTER — Ambulatory Visit: Admitting: "Endocrinology

## 2025-01-13 ENCOUNTER — Inpatient Hospital Stay

## 2025-01-13 ENCOUNTER — Inpatient Hospital Stay: Admitting: Hematology

## 2025-01-18 ENCOUNTER — Ambulatory Visit: Payer: Self-pay
